# Patient Record
Sex: Female | Born: 1935
Health system: Southern US, Community
[De-identification: ages and names within clinical notes are randomized; demographics above are authoritative.]

## PROBLEM LIST (undated history)

## (undated) DIAGNOSIS — M899 Disorder of bone, unspecified: Secondary | ICD-10-CM

## (undated) DIAGNOSIS — G4700 Insomnia, unspecified: Secondary | ICD-10-CM

## (undated) DIAGNOSIS — L909 Atrophic disorder of skin, unspecified: Secondary | ICD-10-CM

## (undated) DIAGNOSIS — Z0000 Encounter for general adult medical examination without abnormal findings: Secondary | ICD-10-CM

## (undated) DIAGNOSIS — N952 Postmenopausal atrophic vaginitis: Secondary | ICD-10-CM

## (undated) DIAGNOSIS — J309 Allergic rhinitis, unspecified: Secondary | ICD-10-CM

## (undated) DIAGNOSIS — N6019 Diffuse cystic mastopathy of unspecified breast: Secondary | ICD-10-CM

## (undated) DIAGNOSIS — B351 Tinea unguium: Secondary | ICD-10-CM

## (undated) DIAGNOSIS — R51 Headache: Secondary | ICD-10-CM

## (undated) DIAGNOSIS — L919 Hypertrophic disorder of the skin, unspecified: Secondary | ICD-10-CM

## (undated) DIAGNOSIS — R002 Palpitations: Secondary | ICD-10-CM

## (undated) DIAGNOSIS — M949 Disorder of cartilage, unspecified: Secondary | ICD-10-CM

## (undated) DIAGNOSIS — I341 Nonrheumatic mitral (valve) prolapse: Secondary | ICD-10-CM

## (undated) DIAGNOSIS — G609 Hereditary and idiopathic neuropathy, unspecified: Secondary | ICD-10-CM

## (undated) DIAGNOSIS — I10 Essential (primary) hypertension: Secondary | ICD-10-CM

## (undated) DIAGNOSIS — H53149 Visual discomfort, unspecified: Secondary | ICD-10-CM

## (undated) DIAGNOSIS — E559 Vitamin D deficiency, unspecified: Secondary | ICD-10-CM

## (undated) DIAGNOSIS — G43909 Migraine, unspecified, not intractable, without status migrainosus: Secondary | ICD-10-CM

## (undated) DIAGNOSIS — E785 Hyperlipidemia, unspecified: Secondary | ICD-10-CM

## (undated) DIAGNOSIS — M84376A Stress fracture, unspecified foot, initial encounter for fracture: Secondary | ICD-10-CM

## (undated) DIAGNOSIS — M8580 Other specified disorders of bone density and structure, unspecified site: Secondary | ICD-10-CM

## (undated) DIAGNOSIS — R109 Unspecified abdominal pain: Secondary | ICD-10-CM

## (undated) DIAGNOSIS — K589 Irritable bowel syndrome without diarrhea: Secondary | ICD-10-CM

## (undated) DIAGNOSIS — R197 Diarrhea, unspecified: Secondary | ICD-10-CM

## (undated) HISTORY — DX: Migraine, unspecified, not intractable, without status migrainosus: G43.909

## (undated) HISTORY — DX: Other specified disorders of bone density and structure, unspecified site: M85.80

## (undated) HISTORY — DX: Diarrhea, unspecified: R19.7

## (undated) HISTORY — DX: Essential (primary) hypertension: I10

## (undated) HISTORY — DX: Palpitations: R00.2

## (undated) HISTORY — DX: Headache: R51

## (undated) HISTORY — DX: Hypertrophic disorder of the skin, unspecified: L91.9

## (undated) HISTORY — DX: Unspecified abdominal pain: R10.9

## (undated) HISTORY — DX: Nonrheumatic mitral (valve) prolapse: I34.1

## (undated) HISTORY — DX: Irritable bowel syndrome without diarrhea: K58.9

## (undated) HISTORY — PX: ROTATOR CUFF REPAIR: SHX139

## (undated) HISTORY — DX: Postmenopausal atrophic vaginitis: N95.2

## (undated) HISTORY — DX: Hereditary and idiopathic neuropathy, unspecified: G60.9

## (undated) HISTORY — DX: Visual discomfort, unspecified: H53.149

## (undated) HISTORY — DX: Stress fracture, unspecified foot, initial encounter for fracture: M84.376A

## (undated) HISTORY — DX: Encounter for general adult medical examination without abnormal findings: Z00.00

## (undated) HISTORY — DX: Hyperlipidemia, unspecified: E78.5

## (undated) HISTORY — DX: Allergic rhinitis, unspecified: J30.9

## (undated) HISTORY — DX: Atrophic disorder of skin, unspecified: L90.9

## (undated) HISTORY — DX: Tinea unguium: B35.1

## (undated) HISTORY — DX: Disorder of cartilage, unspecified: M94.9

## (undated) HISTORY — DX: Disorder of bone, unspecified: M89.9

## (undated) HISTORY — DX: Vitamin D deficiency, unspecified: E55.9

## (undated) HISTORY — PX: TONSILLECTOMY: SUR1361

## (undated) HISTORY — DX: Diffuse cystic mastopathy of unspecified breast: N60.19

## (undated) HISTORY — DX: Insomnia, unspecified: G47.00

---

## 1991-12-19 ENCOUNTER — Encounter: Payer: Self-pay | Admitting: Internal Medicine

## 1997-06-01 ENCOUNTER — Encounter: Admission: RE | Admit: 1997-06-01 | Discharge: 1997-08-30 | Payer: Self-pay | Admitting: *Deleted

## 1997-08-03 ENCOUNTER — Encounter: Admission: RE | Admit: 1997-08-03 | Discharge: 1997-11-01 | Payer: Self-pay | Admitting: *Deleted

## 1999-07-09 ENCOUNTER — Other Ambulatory Visit: Admission: RE | Admit: 1999-07-09 | Discharge: 1999-07-09 | Payer: Self-pay | Admitting: Family Medicine

## 2000-07-08 ENCOUNTER — Other Ambulatory Visit: Admission: RE | Admit: 2000-07-08 | Discharge: 2000-07-08 | Payer: Self-pay | Admitting: Family Medicine

## 2001-08-19 ENCOUNTER — Other Ambulatory Visit: Admission: RE | Admit: 2001-08-19 | Discharge: 2001-08-19 | Payer: Self-pay | Admitting: Family Medicine

## 2003-06-20 ENCOUNTER — Encounter: Payer: Self-pay | Admitting: Internal Medicine

## 2004-01-11 ENCOUNTER — Ambulatory Visit: Payer: Self-pay | Admitting: Family Medicine

## 2004-06-17 ENCOUNTER — Ambulatory Visit: Payer: Self-pay | Admitting: Family Medicine

## 2004-09-12 ENCOUNTER — Ambulatory Visit: Payer: Self-pay | Admitting: Family Medicine

## 2004-09-19 ENCOUNTER — Ambulatory Visit: Payer: Self-pay | Admitting: Family Medicine

## 2004-09-25 ENCOUNTER — Encounter: Admission: RE | Admit: 2004-09-25 | Discharge: 2004-09-25 | Payer: Self-pay | Admitting: Family Medicine

## 2005-09-15 ENCOUNTER — Ambulatory Visit: Payer: Self-pay | Admitting: Family Medicine

## 2005-09-22 ENCOUNTER — Ambulatory Visit: Payer: Self-pay | Admitting: Family Medicine

## 2005-09-25 ENCOUNTER — Ambulatory Visit: Payer: Self-pay | Admitting: Family Medicine

## 2005-10-29 ENCOUNTER — Encounter: Payer: Self-pay | Admitting: Family Medicine

## 2006-01-07 ENCOUNTER — Ambulatory Visit: Payer: Self-pay | Admitting: Family Medicine

## 2006-08-17 ENCOUNTER — Encounter: Payer: Self-pay | Admitting: Family Medicine

## 2006-08-17 ENCOUNTER — Ambulatory Visit: Payer: Self-pay | Admitting: Family Medicine

## 2006-09-10 DIAGNOSIS — G43909 Migraine, unspecified, not intractable, without status migrainosus: Secondary | ICD-10-CM | POA: Insufficient documentation

## 2006-09-10 DIAGNOSIS — M899 Disorder of bone, unspecified: Secondary | ICD-10-CM

## 2006-09-10 DIAGNOSIS — K589 Irritable bowel syndrome without diarrhea: Secondary | ICD-10-CM

## 2006-09-10 DIAGNOSIS — R002 Palpitations: Secondary | ICD-10-CM | POA: Insufficient documentation

## 2006-09-10 DIAGNOSIS — M949 Disorder of cartilage, unspecified: Secondary | ICD-10-CM

## 2006-09-10 HISTORY — DX: Palpitations: R00.2

## 2006-09-10 HISTORY — DX: Disorder of bone, unspecified: M89.9

## 2006-09-10 HISTORY — DX: Migraine, unspecified, not intractable, without status migrainosus: G43.909

## 2006-09-10 HISTORY — DX: Irritable bowel syndrome, unspecified: K58.9

## 2006-09-24 ENCOUNTER — Ambulatory Visit: Payer: Self-pay | Admitting: Family Medicine

## 2006-09-24 LAB — CONVERTED CEMR LAB
Albumin: 3.7 g/dL (ref 3.5–5.2)
BUN: 22 mg/dL (ref 6–23)
Basophils Absolute: 0 10*3/uL (ref 0.0–0.1)
Basophils Relative: 0.5 % (ref 0.0–1.0)
Bilirubin Urine: NEGATIVE
Blood in Urine, dipstick: NEGATIVE
CO2: 30 meq/L (ref 19–32)
Calcium: 9.3 mg/dL (ref 8.4–10.5)
Chloride: 103 meq/L (ref 96–112)
Cholesterol: 170 mg/dL (ref 0–200)
Creatinine, Ser: 0.9 mg/dL (ref 0.4–1.2)
Eosinophils Absolute: 0.3 10*3/uL (ref 0.0–0.6)
GFR calc Af Amer: 79 mL/min
Ketones, urine, test strip: NEGATIVE
MCV: 95.8 fL (ref 78.0–100.0)
Monocytes Absolute: 0.7 10*3/uL (ref 0.2–0.7)
Neutrophils Relative %: 46.3 % (ref 43.0–77.0)
Protein, U semiquant: NEGATIVE
RBC: 3.97 M/uL (ref 3.87–5.11)
RDW: 13 % (ref 11.5–14.6)
TSH: 3.43 microintl units/mL (ref 0.35–5.50)
Total Bilirubin: 1.2 mg/dL (ref 0.3–1.2)
Total CHOL/HDL Ratio: 3.8
VLDL: 20 mg/dL (ref 0–40)
WBC Urine, dipstick: NEGATIVE
WBC: 6 10*3/uL (ref 4.5–10.5)
pH: 7

## 2006-09-30 ENCOUNTER — Encounter: Payer: Self-pay | Admitting: Family Medicine

## 2006-10-01 ENCOUNTER — Ambulatory Visit: Payer: Self-pay | Admitting: Family Medicine

## 2006-10-01 DIAGNOSIS — E785 Hyperlipidemia, unspecified: Secondary | ICD-10-CM

## 2006-10-01 DIAGNOSIS — N952 Postmenopausal atrophic vaginitis: Secondary | ICD-10-CM

## 2006-10-01 DIAGNOSIS — R519 Headache, unspecified: Secondary | ICD-10-CM | POA: Insufficient documentation

## 2006-10-01 DIAGNOSIS — R51 Headache: Secondary | ICD-10-CM

## 2006-10-01 HISTORY — DX: Postmenopausal atrophic vaginitis: N95.2

## 2006-10-01 HISTORY — DX: Hyperlipidemia, unspecified: E78.5

## 2006-10-01 HISTORY — DX: Headache: R51

## 2006-10-13 ENCOUNTER — Ambulatory Visit: Payer: Self-pay | Admitting: Family Medicine

## 2006-10-14 ENCOUNTER — Telehealth (INDEPENDENT_AMBULATORY_CARE_PROVIDER_SITE_OTHER): Payer: Self-pay | Admitting: *Deleted

## 2006-11-09 DIAGNOSIS — L909 Atrophic disorder of skin, unspecified: Secondary | ICD-10-CM | POA: Insufficient documentation

## 2006-11-09 DIAGNOSIS — L919 Hypertrophic disorder of the skin, unspecified: Secondary | ICD-10-CM

## 2006-11-09 HISTORY — DX: Atrophic disorder of skin, unspecified: L90.9

## 2006-11-17 ENCOUNTER — Encounter: Payer: Self-pay | Admitting: Family Medicine

## 2006-12-11 ENCOUNTER — Ambulatory Visit: Payer: Self-pay | Admitting: Family Medicine

## 2007-07-16 ENCOUNTER — Telehealth (INDEPENDENT_AMBULATORY_CARE_PROVIDER_SITE_OTHER): Payer: Self-pay | Admitting: *Deleted

## 2007-09-29 ENCOUNTER — Ambulatory Visit: Payer: Self-pay | Admitting: Family Medicine

## 2007-09-29 LAB — CONVERTED CEMR LAB
AST: 30 units/L (ref 0–37)
BUN: 24 mg/dL — ABNORMAL HIGH (ref 6–23)
Basophils Relative: 0.5 % (ref 0.0–3.0)
Bilirubin Urine: NEGATIVE
Bilirubin, Direct: 0.1 mg/dL (ref 0.0–0.3)
Blood in Urine, dipstick: NEGATIVE
CO2: 30 meq/L (ref 19–32)
Chloride: 101 meq/L (ref 96–112)
Creatinine, Ser: 1 mg/dL (ref 0.4–1.2)
GFR calc Af Amer: 70 mL/min
Glucose, Bld: 96 mg/dL (ref 70–99)
Glucose, Urine, Semiquant: NEGATIVE
HCT: 40.4 % (ref 36.0–46.0)
Hemoglobin: 14 g/dL (ref 12.0–15.0)
Ketones, urine, test strip: NEGATIVE
LDL Cholesterol: 99 mg/dL (ref 0–99)
MCHC: 34.6 g/dL (ref 30.0–36.0)
MCV: 99 fL (ref 78.0–100.0)
Monocytes Absolute: 0.6 10*3/uL (ref 0.1–1.0)
Neutro Abs: 3 10*3/uL (ref 1.4–7.7)
Nitrite: NEGATIVE
Platelets: 246 10*3/uL (ref 150–400)
Potassium: 3.5 meq/L (ref 3.5–5.1)
Protein, U semiquant: NEGATIVE
RBC: 4.08 M/uL (ref 3.87–5.11)
RDW: 13.6 % (ref 11.5–14.6)
TSH: 4.07 microintl units/mL (ref 0.35–5.50)
Total CHOL/HDL Ratio: 3.4
Urobilinogen, UA: 0.2
WBC: 6.1 10*3/uL (ref 4.5–10.5)

## 2007-10-05 ENCOUNTER — Encounter: Payer: Self-pay | Admitting: Family Medicine

## 2007-10-06 ENCOUNTER — Ambulatory Visit: Payer: Self-pay | Admitting: Family Medicine

## 2007-10-06 DIAGNOSIS — I10 Essential (primary) hypertension: Secondary | ICD-10-CM

## 2007-10-06 HISTORY — DX: Essential (primary) hypertension: I10

## 2007-12-14 ENCOUNTER — Ambulatory Visit: Payer: Self-pay | Admitting: Family Medicine

## 2007-12-28 ENCOUNTER — Encounter: Payer: Self-pay | Admitting: Family Medicine

## 2008-02-02 ENCOUNTER — Ambulatory Visit: Payer: Self-pay | Admitting: Family Medicine

## 2008-02-02 DIAGNOSIS — J300 Vasomotor rhinitis: Secondary | ICD-10-CM | POA: Insufficient documentation

## 2008-02-02 DIAGNOSIS — J309 Allergic rhinitis, unspecified: Secondary | ICD-10-CM

## 2008-02-02 HISTORY — DX: Vasomotor rhinitis: J30.0

## 2008-02-02 HISTORY — DX: Allergic rhinitis, unspecified: J30.9

## 2008-06-23 ENCOUNTER — Telehealth: Payer: Self-pay | Admitting: Internal Medicine

## 2008-06-26 ENCOUNTER — Ambulatory Visit: Payer: Self-pay | Admitting: Internal Medicine

## 2008-06-26 DIAGNOSIS — R109 Unspecified abdominal pain: Secondary | ICD-10-CM

## 2008-06-26 DIAGNOSIS — R197 Diarrhea, unspecified: Secondary | ICD-10-CM

## 2008-06-26 HISTORY — DX: Diarrhea, unspecified: R19.7

## 2008-06-26 HISTORY — DX: Unspecified abdominal pain: R10.9

## 2008-06-28 ENCOUNTER — Ambulatory Visit (HOSPITAL_COMMUNITY): Admission: RE | Admit: 2008-06-28 | Discharge: 2008-06-28 | Payer: Self-pay | Admitting: Internal Medicine

## 2008-09-22 ENCOUNTER — Ambulatory Visit: Payer: Self-pay | Admitting: Family Medicine

## 2008-09-22 LAB — CONVERTED CEMR LAB
ALT: 23 units/L (ref 0–35)
AST: 32 units/L (ref 0–37)
Albumin: 3.9 g/dL (ref 3.5–5.2)
Bilirubin Urine: NEGATIVE
CO2: 30 meq/L (ref 19–32)
Calcium: 9.7 mg/dL (ref 8.4–10.5)
Cholesterol: 159 mg/dL (ref 0–200)
Creatinine, Ser: 1 mg/dL (ref 0.4–1.2)
Eosinophils Relative: 8.4 % — ABNORMAL HIGH (ref 0.0–5.0)
GFR calc non Af Amer: 57.22 mL/min (ref >60)
Glucose, Urine, Semiquant: NEGATIVE
LDL Cholesterol: 89 mg/dL (ref 0–99)
Lymphocytes Relative: 35.1 % (ref 12.0–46.0)
Lymphs Abs: 1.8 10*3/uL (ref 0.7–4.0)
Neutrophils Relative %: 44.2 % (ref 43.0–77.0)
Nitrite: NEGATIVE
RBC: 3.96 M/uL (ref 3.87–5.11)
RDW: 13.3 % (ref 11.5–14.6)
Specific Gravity, Urine: 1.015
Total Bilirubin: 1.3 mg/dL — ABNORMAL HIGH (ref 0.3–1.2)
Total Protein: 7.2 g/dL (ref 6.0–8.3)
VLDL: 18.4 mg/dL (ref 0.0–40.0)

## 2008-10-05 ENCOUNTER — Encounter: Payer: Self-pay | Admitting: Family Medicine

## 2008-10-06 ENCOUNTER — Ambulatory Visit: Payer: Self-pay | Admitting: Family Medicine

## 2008-10-06 ENCOUNTER — Telehealth: Payer: Self-pay | Admitting: *Deleted

## 2008-10-19 ENCOUNTER — Encounter: Payer: Self-pay | Admitting: Family Medicine

## 2008-10-19 ENCOUNTER — Ambulatory Visit: Payer: Self-pay | Admitting: Internal Medicine

## 2008-12-26 ENCOUNTER — Ambulatory Visit: Payer: Self-pay | Admitting: Family Medicine

## 2009-03-26 ENCOUNTER — Encounter: Payer: Self-pay | Admitting: *Deleted

## 2009-07-20 ENCOUNTER — Ambulatory Visit: Payer: Self-pay | Admitting: Internal Medicine

## 2009-07-20 DIAGNOSIS — H53149 Visual discomfort, unspecified: Secondary | ICD-10-CM

## 2009-07-20 HISTORY — DX: Visual discomfort, unspecified: H53.149

## 2009-09-25 ENCOUNTER — Ambulatory Visit: Payer: Self-pay | Admitting: Family Medicine

## 2009-09-25 LAB — CONVERTED CEMR LAB
AST: 29 units/L (ref 0–37)
BUN: 22 mg/dL (ref 6–23)
Basophils Absolute: 0 10*3/uL (ref 0.0–0.1)
Bilirubin Urine: NEGATIVE
Bilirubin, Direct: 0.2 mg/dL (ref 0.0–0.3)
CO2: 31 meq/L (ref 19–32)
Chloride: 104 meq/L (ref 96–112)
Cholesterol: 166 mg/dL (ref 0–200)
Creatinine, Ser: 0.9 mg/dL (ref 0.4–1.2)
Eosinophils Relative: 4.5 % (ref 0.0–5.0)
Glucose, Bld: 108 mg/dL — ABNORMAL HIGH (ref 70–99)
Glucose, Urine, Semiquant: NEGATIVE
Lymphs Abs: 1.9 10*3/uL (ref 0.7–4.0)
MCHC: 34 g/dL (ref 30.0–36.0)
MCV: 99.2 fL (ref 78.0–100.0)
Monocytes Absolute: 0.5 10*3/uL (ref 0.1–1.0)
Neutro Abs: 2.9 10*3/uL (ref 1.4–7.7)
Protein, U semiquant: NEGATIVE
RBC: 3.98 M/uL (ref 3.87–5.11)
Sodium: 140 meq/L (ref 135–145)
Total Bilirubin: 1.6 mg/dL — ABNORMAL HIGH (ref 0.3–1.2)
Total CHOL/HDL Ratio: 3
Urobilinogen, UA: 0.2
pH: 7

## 2009-11-06 ENCOUNTER — Ambulatory Visit: Payer: Self-pay | Admitting: Family Medicine

## 2009-11-06 ENCOUNTER — Other Ambulatory Visit: Admission: RE | Admit: 2009-11-06 | Discharge: 2009-11-06 | Payer: Self-pay | Admitting: Family Medicine

## 2009-11-06 LAB — CONVERTED CEMR LAB: Pap Smear: NEGATIVE

## 2009-12-04 ENCOUNTER — Ambulatory Visit: Payer: Self-pay | Admitting: Family Medicine

## 2010-01-01 ENCOUNTER — Ambulatory Visit: Payer: Self-pay | Admitting: Family Medicine

## 2010-03-01 ENCOUNTER — Ambulatory Visit
Admission: RE | Admit: 2010-03-01 | Discharge: 2010-03-01 | Payer: Self-pay | Source: Home / Self Care | Attending: Family Medicine | Admitting: Family Medicine

## 2010-03-08 ENCOUNTER — Ambulatory Visit
Admission: RE | Admit: 2010-03-08 | Discharge: 2010-03-08 | Payer: Self-pay | Source: Home / Self Care | Attending: Family Medicine | Admitting: Family Medicine

## 2010-03-14 ENCOUNTER — Encounter: Payer: Self-pay | Admitting: Cardiology

## 2010-03-14 ENCOUNTER — Ambulatory Visit
Admission: RE | Admit: 2010-03-14 | Discharge: 2010-03-14 | Payer: Self-pay | Source: Home / Self Care | Attending: Cardiology | Admitting: Cardiology

## 2010-03-18 ENCOUNTER — Encounter: Payer: Self-pay | Admitting: Cardiology

## 2010-03-18 ENCOUNTER — Ambulatory Visit: Admission: RE | Admit: 2010-03-18 | Discharge: 2010-03-18 | Payer: Self-pay | Source: Home / Self Care

## 2010-03-18 ENCOUNTER — Ambulatory Visit (HOSPITAL_COMMUNITY)
Admission: RE | Admit: 2010-03-18 | Discharge: 2010-03-18 | Payer: Self-pay | Source: Home / Self Care | Attending: Cardiology | Admitting: Cardiology

## 2010-03-18 DIAGNOSIS — R002 Palpitations: Secondary | ICD-10-CM

## 2010-03-30 ENCOUNTER — Encounter: Payer: Self-pay | Admitting: Family Medicine
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
## 2010-04-04 NOTE — Miscellaneous (Signed)
Summary: mammogram  Clinical Lists Changes  Observations: Added new observation of MAMMOGRAM: normal (03/20/2009 11:46)      Preventive Care Screening  Mammogram:    Date:  03/20/2009    Results:  normal

## 2010-04-04 NOTE — Assessment & Plan Note (Signed)
Summary: 1 month rov/njr   Vital Signs:  Patient profile:   77 year old female Menstrual status:  postmenopausal Weight:      125 pounds Temp:     98.4 degrees F oral BP sitting:   140 / 78  (left arm) Cuff size:   regular  Vitals Entered By: Kern Reap CMA Duncan Dull) (December 04, 2009 1:47 PM) CC: follow-up visit   Primary Care Jocilynn Grade:  Tinnie Gens Todd,MD  CC:  follow-up visit.  History of Present Illness: Christine Henderson  is a 52 year oldmarried female, nonsmoker, who comes in today for reevaluation of hypertension.  Tenoretic 50 -- 25 daily her blood pressure was too low.  We therefore cut the dose in half.  She's been monitoring her blood pressure home.  It still too low.  The other day.  She had an episode where she was walking and got very lightheaded.  Her BP at that time was 100/60.  Allergies: 1)  ! Codeine  Past History:  Past medical, surgical, family and social histories (including risk factors) reviewed for relevance to current acute and chronic problems.  Past Medical History: Reviewed history from 06/23/2008 and no changes required. fibrocystic breast ALLERGIC RHINITIS CAUSE UNSPECIFIED (ICD-477.9) HYPERTENSION (ICD-401.9) SKIN TAG (ICD-701.9) VAGINITIS, ATROPHIC (ICD-627.3) HEADACHE (ICD-784.0) HYPERLIPIDEMIA (ICD-272.4) HEALTH SCREENING (ICD-V70.0) IRRITABLE BOWEL SYNDROME (ICD-564.1) MIGRAINE HEADACHE (ICD-346.90) PALPITATIONS (ICD-785.1) OSTEOPENIA (ICD-733.90)  Past Surgical History: Reviewed history from 06/23/2008 and no changes required. CB x 4  Rotator cuff repair-Rt Colonoscopy-01/06/2005 Tonsillectomy  Family History: Reviewed history from 06/26/2008 and no changes required. No FH of Colon Cancer: Family History of Pancreatic Cancer:Mgrandfather Family History of Inflammatory Bowel Disease: Sister  Social History: Reviewed history from 06/26/2008 and no changes required. Married Never Smoked Alcohol use-no Drug use-no Regular  exercise-yes Occupation: retired  Review of Systems      See HPI  Physical Exam  General:  Well-developed,well-nourished,in no acute distress; alert,appropriate and cooperative throughout examination   Impression & Recommendations:  Problem # 1:  HYPERTENSION (ICD-401.9) Assessment Unchanged  The following medications were removed from the medication list:    Tenoretic 50 50-25 Mg Tabs (Atenolol-chlorthalidone) .[name] Kitchen... 1/.2 qam Her updated medication list for this problem includes:    Tenormin 25 Mg Tabs (Atenolol) .[name] Kitchen... 1/2 qam  Complete Medication List: 1)  Daily Multiple Vitamins Tabs (Multiple vitamin) .... Take 1 tablet by mouth once a day 2)  Calcium 1200 Mg With Vitamin D  .... Take 1 tablet by mouth once a day 3)  Fish Oil Oil (Fish oil) .... Take 1 tablet by mouth once a day 4)  Aspirin 81 Mg Tbec (Aspirin) .... Take 1 tablet by mouth once a day 5)  Simvastatin 10 Mg Tabs (Simvastatin) .... Take 1 tablet by mouth once a day 6)  Glucosamine Chondr 1500 Complx Caps (Glucosamine-chondroit-vit c-mn) .[name] Kitchen.. 1 tablet by mouth two times a day 7)  Vitamin C Cr 1000 Mg Cr-tabs (Ascorbic acid) .[name] Kitchen.. 1 by mouth once daily 8)  Imodium A-d 2 Mg Tabs (Loperamide hcl) .[name] Kitchen.. 1 by mouth as needed 9)  Premarin 0.625 Mg/gm Crea (Estrogens, conjugated) .... Uad 10)  Tenormin 25 Mg Tabs (Atenolol) .... 1/2 qam  Patient Instructions: 1)  decrease the Tenormin to 12.5 mg........ 25-mg tablet.......... one half tab........[name] Kitchen every morning.  Check her BP every morning.  Return in one month for follow-up Prescriptions: TENORMIN 25 MG TABS (ATENOLOL) 1/2 qam  #50 x 3   Entered and Authorized by:   Roderick Pee MD  Signed by:   Roderick Pee MD on 12/04/2009   Method used:   Electronically to        [facility] [address] (retail)       [phone]       [place], [place]  [address]       Ph: [phone]       Fax: [phone]   RxID:   [id number]

## 2010-04-04 NOTE — Assessment & Plan Note (Signed)
Summary: np6   Visit Type:  np Referring Provider:  n/a Primary Provider:  Tinnie Gens Todd,MD  CC:  palpitations, SOB, and dizziness.  History of Present Illness: 77 yo with history of HTN presents for evaluation of tachypalpitations.  This summer, patient would get episodes of dizziness/lightheadedness.  She saw Dr. Tawanna Cooler who noted that her blood pressure was on the low side.  Her BP meds were adjusted downward until she was only taking atenolol 25 mg daily.  In 12/11, her blood pressure started to go up significantly, running as high as 180s/100s.  On 12/30, she had an episode while in the kitchen fixing breakfast where her heart suddenly began to pound.  She felt lightheaded, and when she took her pulse, she noted it to be in the 140s.  Very soon after this, heart rate fell to the 60s.  She has had no further episodes like this but has noted more frequent brief palpitations/flutters.  She has a history of palpitations, but these are worse.  She was on atenolol 50 mg daily until this summer, and this appears to have controlled the palpitations well.  She is taking 25 mg atenolol daily now.   BP today is 159/97.  Her home readings have been 110s - 140s systolic.  Starting amlodipine seems to have gotten this under control   Labs (7/11): LDL 89, HDL 52, K 3.8, creatinine 0.9, TSH normal  ECG: NSR, normal  Current Medications (verified): 1)  Daily Multiple Vitamins   Tabs (Multiple Vitamin) .... Take 1 Tablet By Mouth Once A Day 2)  Calcium 1200 Mg With Vitamin D .... Take 1 Tablet By Mouth Once A Day 3)  Fish Oil   Oil (Fish Oil) .... Take 1 Tablet By Mouth Once A Day 4)  Aspirin 81 Mg  Tbec (Aspirin) .... Take 1 Tablet By Mouth Once A Day 5)  Simvastatin 10 Mg  Tabs (Simvastatin) .... Take 1 Tablet By Mouth Once A Day 6)  Glucosamine Chondr 1500 Complx  Caps (Glucosamine-Chondroit-Vit C-Mn) .[name] Kitchen.. 1 Tablet By Mouth Two Times A Day 7)  Vitamin C Cr 1000 Mg Cr-Tabs (Ascorbic Acid) .[name] Kitchen.. 1 By Mouth  Once Daily 8)  Imodium A-D 2 Mg Tabs (Loperamide Hcl) .[name] Kitchen.. 1 By Mouth As Needed 9)  Premarin 0.625 Mg/gm Crea (Estrogens, Conjugated) .... Uad 10)  Tenormin 25 Mg Tabs (Atenolol) .... 1/2 Qam and 1/2 in The Pm 11)  Amlodipine Besylate 5 Mg Tabs (Amlodipine Besylate) .... One By Mouth Once Daily  Allergies (verified): 1)  ! Codeine  Past History:  Past Medical History: 1. fibrocystic breast 2. ALLERGIC RHINITIS CAUSE UNSPECIFIED (ICD-477.9) 3. HYPERTENSION (ICD-401.9) 4. VAGINITIS, ATROPHIC (ICD-627.3) 5. HEADACHE (ICD-784.0): migraines 6. HYPERLIPIDEMIA (ICD-272.4) 7. IRRITABLE BOWEL SYNDROME (ICD-564.1) 8. PALPITATIONS (ICD-785.1): Has had these for years 9. OSTEOPENIA (ICD-733.90) 10. Mitral valve prolapse: Was given this diagnosis years ago.   Family History: No FH of Colon Cancer: Family History of Pancreatic Cancer:Mgrandfather Family History of Inflammatory Bowel Disease: Sister HTN No premature coronary disease  Social History: Married, lives in [place] Never Smoked Alcohol use-no Drug use-no Regular exercise-yes Occupation: retired  Review of Systems       All systems reviewed and negative except as per HPI.   Vital Signs:  Patient profile:   77 year old female Menstrual status:  postmenopausal Height:      63.5 inches Weight:      127 pounds BMI:     22.22 Pulse rate:   80 / minute  BP sitting:   159 / 97  (left arm) Cuff size:   regular  Vitals Entered By: Caralee Ates CMA (March 14, 2010 3:38 PM)  Physical Exam  General:  Well developed, well nourished, in no acute distress. Head:  normocephalic and atraumatic Nose:  no deformity, discharge, inflammation, or lesions Mouth:  Teeth, gums and palate normal. Oral mucosa normal. Neck:  Neck supple, no JVD. No masses, thyromegaly or abnormal cervical nodes. Lungs:  Clear bilaterally to auscultation and percussion. Heart:  Non-displaced PMI, chest non-tender; regular rate and rhythm, S1, S2 without  murmurs, rubs or gallops. Carotid upstroke normal, no bruit.  Pedals normal pulses. No edema, no varicosities. Abdomen:  Bowel sounds positive; abdomen soft and non-tender without masses, organomegaly, or hernias noted. No hepatosplenomegaly. Msk:  Back normal, normal gait. Muscle strength and tone normal. Extremities:  No clubbing or cyanosis. Neurologic:  Alert and oriented x 3. Skin:  Intact without lesions or rashes. Psych:  Normal affect.   Impression & Recommendations:  Problem # 1:  HYPERTENSION (ICD-401.9) BP seems to be reasonably controlled now by her home readings though it is a little high in the office today.   Problem # 2:  PALPITATIONS (ICD-785.1) I suspect that the symptoms she is getting currently are PVCs or PACs.  She is likely getting them more frequently on a lower dose of atenolol.  The episode she had on 12/30 sounds like SVT of some sort.  I will put her on a 3-week event monitor to see if any significant arrhythmias are detected.  Following this, I would consider increasing her atenolol back to 50 mg daily and perhaps decreasing amlodipine (if needed to keep BP from dipping too low like it did this summer).  Patient avoids caffeine.   Problem # 3:  MITRAL VALVE PROLAPSE Given patient's history of mitral valve prolapse, will get an echocardiogram.   Other Orders: EKG w/ Interpretation (93000) Event (Event) Echocardiogram (Echo)  Patient Instructions: 1)  Your physician recommends that you schedule a follow-up appointment in: 1 month. 2)  Your physician has requested that you have an echocardiogram.  Echocardiography is a painless test that uses sound waves to create images of your heart. It provides your doctor with information about the size and shape of your heart and how well your heart's chambers and valves are working.  This procedure takes approximately one hour. There are no restrictions for this procedure. 3)  Your physician has recommended that you wear an  event monitor for 3 weeks.  Event monitors are medical devices that record the heart's electrical activity. Doctors most often use these monitors to diagnose arrhythmias. Arrhythmias are problems with the speed or rhythm of the heartbeat. The monitor is a small, portable device. You can wear one while you do your normal daily activities. This is usually used to diagnose what is causing palpitations/syncope (passing out).

## 2010-04-04 NOTE — Assessment & Plan Note (Signed)
Summary: high bp readings/cjr   Vital Signs:  Patient profile:   77 year old female Menstrual status:  postmenopausal Weight:      126 pounds Temp:     97.7 degrees F oral Pulse rate:   80 / minute Resp:     12 per minute BP sitting:   170 / 120  (left arm) BP standing:   184 / 108 Cuff size:   regular  Vitals Entered By: Sid Falcon LPN (March 01, 2010 3:42 PM)  Serial Vital Signs/Assessments:  Time      Position  BP       Pulse  Resp  Temp     By           Sitting   182/110                        Evelena Peat MD           Standing  184/108                        Evelena Peat MD  CC: concerned about high BP Is Patient Diabetic? No   History of Present Illness: Seen for elevating blood pressures.  Pt in Sept had freq light headed symptoms.  Was on Atenolol/chlor  combination and reduced to Atenolol 25 mg one half tablet daily. Recent BP running 170-180 systolic and 90-105 diastolic.  No headaches, chest pain, or dizziness.  Thyroid normal in September. Pt compliant with meds.  No diet changes other than possibly more Na over holidays. No ETOH and no regular use of NSAIDS.   Allergies: 1)  ! Codeine  Past History:  Past Medical History: Last updated: 06/23/2008 fibrocystic breast ALLERGIC RHINITIS CAUSE UNSPECIFIED (ICD-477.9) HYPERTENSION (ICD-401.9) SKIN TAG (ICD-701.9) VAGINITIS, ATROPHIC (ICD-627.3) HEADACHE (ICD-784.0) HYPERLIPIDEMIA (ICD-272.4) HEALTH SCREENING (ICD-V70.0) IRRITABLE BOWEL SYNDROME (ICD-564.1) MIGRAINE HEADACHE (ICD-346.90) PALPITATIONS (ICD-785.1) OSTEOPENIA (ICD-733.90)  Past Surgical History: Last updated: 06/23/2008 CB x 4  Rotator cuff repair-Rt Colonoscopy-01/06/2005 Tonsillectomy  Family History: Last updated: 06/26/2008 No FH of Colon Cancer: Family History of Pancreatic Cancer:Mgrandfather Family History of Inflammatory Bowel Disease: Sister  Social History: Last updated: 06/26/2008 Married Never  Smoked Alcohol use-no Drug use-no Regular exercise-yes Occupation: retired  Risk Factors: Exercise: yes (10/06/2007)  Risk Factors: Smoking Status: never (11/06/2009) PMH-FH-SH reviewed for relevance  Review of Systems  The patient denies anorexia, fever, weight loss, chest pain, syncope, dyspnea on exertion, peripheral edema, prolonged cough, headaches, hemoptysis, abdominal pain, melena, hematochezia, severe indigestion/heartburn, and muscle weakness.    Physical Exam  General:  Well-developed,well-nourished,in no acute distress; alert,appropriate and cooperative throughout examination Head:  normocephalic and atraumatic.   Eyes:  pupils equal, pupils round, and pupils reactive to light.   Mouth:  Oral mucosa and oropharynx without lesions or exudates.  Teeth in good repair. Neck:  No deformities, masses, or tenderness noted. Lungs:  Normal respiratory effort, chest expands symmetrically. Lungs are clear to auscultation, no crackles or wheezes. Heart:  Normal rate and regular rhythm. S1 and S2 normal without gallop, murmur, click, rub or other extra sounds. Extremities:  no edema Neurologic:  alert & oriented X3, cranial nerves II-XII intact, and strength normal in all extremities.     Impression & Recommendations:  Problem # 1:  HYPERTENSION (ICD-401.9) Assessment Deteriorated pt with several day hx of severe BP elevation.  Add amlodipine and cont with atenolol.  Reassess with Dr Tawanna Cooler today. Her updated medication  list for this problem includes:    Tenormin 25 Mg Tabs (Atenolol) .[name] Kitchen... 1/2 qam    Amlodipine Besylate 5 Mg Tabs (Amlodipine besylate) ..... One by mouth once daily  Complete Medication List: 1)  Daily Multiple Vitamins Tabs (Multiple vitamin) .... Take 1 tablet by mouth once a day 2)  Calcium 1200 Mg With Vitamin D  .... Take 1 tablet by mouth once a day 3)  Fish Oil Oil (Fish oil) .... Take 1 tablet by mouth once a day 4)  Aspirin 81 Mg Tbec (Aspirin) ....  Take 1 tablet by mouth once a day 5)  Simvastatin 10 Mg Tabs (Simvastatin) .... Take 1 tablet by mouth once a day 6)  Glucosamine Chondr 1500 Complx Caps (Glucosamine-chondroit-vit c-mn) .[name] Kitchen.. 1 tablet by mouth two times a day 7)  Vitamin C Cr 1000 Mg Cr-tabs (Ascorbic acid) .[name] Kitchen.. 1 by mouth once daily 8)  Imodium A-d 2 Mg Tabs (Loperamide hcl) .[name] Kitchen.. 1 by mouth as needed 9)  Premarin 0.625 Mg/gm Crea (Estrogens, conjugated) .... Uad 10)  Tenormin 25 Mg Tabs (Atenolol) .... 1/2 qam 11)  Amlodipine Besylate 5 Mg Tabs (Amlodipine besylate) .... One by mouth once daily  Patient Instructions: 1)  Start Amlodipine 5 mg one daily 2)  Increase atenolol 25 mg to one half twice daily 3)  Schedule follow up with Dr Tawanna Cooler end of next week. 4)  Avoid Advil as much as possible. Prescriptions: AMLODIPINE BESYLATE 5 MG TABS (AMLODIPINE BESYLATE) one by mouth once daily  #30 x 5   Entered and Authorized by:   Evelena Peat MD   Signed by:   Evelena Peat MD on 03/01/2010   Method used:   Electronically to        [facility] [address] (retail)       [phone]       [place], [place]  [address]       Ph: [phone]       Fax: [phone]   RxID:   [id number]    Orders Added: 1)  Est. Patient Level IV [26948]

## 2010-04-04 NOTE — Assessment & Plan Note (Signed)
Summary: EYE INFECTION // RS   Vital Signs:  Patient profile:   77 year old female Weight:      129 pounds Temp:     98.2 degrees F oral BP sitting:   160 / 100  (left arm) Cuff size:   regular  Vitals Entered By: Duard Brady LPN (Jul 20, 2009 10:47 AM) CC: c/o (L) eye 'inflamation" , watering, scratchy, but no drainage Is Patient Diabetic? No   Primary Care Provider:  Tinnie Gens Todd,MD  CC:  c/o (L) eye 'inflamation" , watering, scratchy, and but no drainage.  History of Present Illness: 77 year old patient, who noted a foreign body sensation in her left eye late yesterday evening.  She had a fairly uncomfortable night.  She has noticed some slight redness.  Mild increased lacrimation, but no purulent drainage.  She has noted a significant photophobia.  She has had conjunctivitis in the past, associated with discharge, but this scenario is much different.  Photophobia is a new complaint.  Preventive Screening-Counseling & Management  Alcohol-Tobacco     Smoking Status: never  Allergies: 1)  ! Codeine  Physical Exam  General:  Well-developed,well-nourished,in no acute distress; alert,appropriate and cooperative throughout examination Head:  Normocephalic and atraumatic without obvious abnormalities. No apparent alopecia or balding. Eyes:  mild conjunctiva injection bilaterally.  No exudate.  Pupils are mid position, and equally reactive.  mild cataract  noted on the left, photophobia noted on the left; visual acuity 20/40 left eye and 20/30, OD   Impression & Recommendations:  Problem # 1:  PHOTOPHOBIA (ZOX-096.04)  Orders: Ophthalmology Referral (Ophthalmology)  Problem # 2:  HYPERTENSION (ICD-401.9)  Her updated medication list for this problem includes:    Tenoretic 50 50-25 Mg Tabs (Atenolol-chlorthalidone) .[name] Kitchen... Take 1 tablet by mouth once a day  Her updated medication list for this problem includes:    Tenoretic 50 50-25 Mg Tabs (Atenolol-chlorthalidone)  .[name] Kitchen... Take 1 tablet by mouth once a day  Complete Medication List: 1)  Daily Multiple Vitamins Tabs (Multiple vitamin) .... Take 1 tablet by mouth once a day 2)  Calcium 1200 Mg With Vitamin D  .... Take 1 tablet by mouth once a day 3)  Fish Oil Oil (Fish oil) .... Take 1 tablet by mouth once a day 4)  Tenoretic 50 50-25 Mg Tabs (Atenolol-chlorthalidone) .... Take 1 tablet by mouth once a day 5)  Aspirin 81 Mg Tbec (Aspirin) .... Take 1 tablet by mouth once a day 6)  Simvastatin 10 Mg Tabs (Simvastatin) .... Take 1 tablet by mouth once a day 7)  Glucosamine Chondr 1500 Complx Caps (Glucosamine-chondroit-vit c-mn) .[name] Kitchen.. 1 tablet by mouth two times a day 8)  Vitamin C Cr 1000 Mg Cr-tabs (Ascorbic acid) .[name] Kitchen.. 1 by mouth once daily 9)  Imodium A-d 2 Mg Tabs (Loperamide hcl) .[name] Kitchen.. 1 by mouth as needed 10)  Dicyclomine Hcl 20 Mg Tabs (Dicyclomine hcl) .... Take 1 tablet by mouth every morning and as needed 11)  Fosamax 70 Mg Tabs (Alendronate sodium) .[name] KitchenMarland KitchenMarland Kitchen 1 weekly  Patient Instructions: 1)  follow-up with Dr. Oneta Rack today
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
## 2010-04-04 NOTE — Assessment & Plan Note (Signed)
Summary: 1 month rov/njr   Vital Signs:  Patient profile:   77 year old female Menstrual status:  postmenopausal Weight:      127 pounds O2 Sat:      96 % on Room air Pulse rate:   68 / minute Pulse rhythm:   regular Resp:     16 per minute BP sitting:   142 / 78  (left arm)  Vitals Entered By: Harold Barban (January 01, 2010 10:12 AM)  O2 Flow:  Room air CC: 1 month follow-up for BP   Primary Care Provider:  Tinnie Gens Todd,MD  CC:  1 month follow-up for BP.  History of Present Illness: Christine Henderson is a 77 year old female, who comes back today for follow-up of hyper tension.  We actually had to decrease her Tenormin to 12.5 mg daily because blood pressure was too low and she was lightheaded when she stood up.  Now her blood pressures are ranging 130 to 140 systolic to diastolic 7580 no lightheadedness  Allergies: 1)  ! Codeine  Past History:  Past medical, surgical, family and social histories (including risk factors) reviewed for relevance to current acute and chronic problems.  Past Medical History: Reviewed history from 06/23/2008 and no changes required. fibrocystic breast ALLERGIC RHINITIS CAUSE UNSPECIFIED (ICD-477.9) HYPERTENSION (ICD-401.9) SKIN TAG (ICD-701.9) VAGINITIS, ATROPHIC (ICD-627.3) HEADACHE (ICD-784.0) HYPERLIPIDEMIA (ICD-272.4) HEALTH SCREENING (ICD-V70.0) IRRITABLE BOWEL SYNDROME (ICD-564.1) MIGRAINE HEADACHE (ICD-346.90) PALPITATIONS (ICD-785.1) OSTEOPENIA (ICD-733.90)  Past Surgical History: Reviewed history from 06/23/2008 and no changes required. CB x 4  Rotator cuff repair-Rt Colonoscopy-01/06/2005 Tonsillectomy  Family History: Reviewed history from 06/26/2008 and no changes required. No FH of Colon Cancer: Family History of Pancreatic Cancer:Mgrandfather Family History of Inflammatory Bowel Disease: Sister  Social History: Reviewed history from 06/26/2008 and no changes required. Married Never Smoked Alcohol use-no Drug  use-no Regular exercise-yes Occupation: retired  Review of Systems      See HPI  Physical Exam  General:  Well-developed,well-nourished,in no acute distress; alert,appropriate and cooperative throughout examination   Impression & Recommendations:  Problem # 1:  HYPERTENSION (ICD-401.9) Assessment Improved  Her updated medication list for this problem includes:    Tenormin 25 Mg Tabs (Atenolol) .[name] Kitchen... 1/2 qam  Complete Medication List: 1)  Daily Multiple Vitamins Tabs (Multiple vitamin) .... Take 1 tablet by mouth once a day 2)  Calcium 1200 Mg With Vitamin D  .... Take 1 tablet by mouth once a day 3)  Fish Oil Oil (Fish oil) .... Take 1 tablet by mouth once a day 4)  Aspirin 81 Mg Tbec (Aspirin) .... Take 1 tablet by mouth once a day 5)  Simvastatin 10 Mg Tabs (Simvastatin) .... Take 1 tablet by mouth once a day 6)  Glucosamine Chondr 1500 Complx Caps (Glucosamine-chondroit-vit c-mn) .[name] Kitchen.. 1 tablet by mouth two times a day 7)  Vitamin C Cr 1000 Mg Cr-tabs (Ascorbic acid) .[name] Kitchen.. 1 by mouth once daily 8)  Imodium A-d 2 Mg Tabs (Loperamide hcl) .[name] Kitchen.. 1 by mouth as needed 9)  Premarin 0.625 Mg/gm Crea (Estrogens, conjugated) .... Uad 10)  Tenormin 25 Mg Tabs (Atenolol) .... 1/2 qam  Patient Instructions: 1)  continue the Tenormin in half a tablet daily.  Return in September 2012 42 or annual physical exam. 2)  Check your blood pressure weekly. 3)  If you see an upward trend then returned sooner than September Prescriptions: TENORMIN 25 MG TABS (ATENOLOL) 1/2 qam  #50 x 3   Entered and Authorized by:   Roderick Pee  MD   Signed by:   Roderick Pee MD on 01/01/2010   Method used:   Print then Give to Patient   RxID:   [id number]    Orders Added: 1)  Est. Patient Level II [69629]

## 2010-04-05 ENCOUNTER — Ambulatory Visit: Admit: 2010-04-05 | Payer: Self-pay | Admitting: Family Medicine

## 2010-04-05 ENCOUNTER — Ambulatory Visit (INDEPENDENT_AMBULATORY_CARE_PROVIDER_SITE_OTHER): Payer: Medicare Other | Admitting: Family Medicine

## 2010-04-05 ENCOUNTER — Encounter: Payer: Self-pay | Admitting: Family Medicine

## 2010-04-05 VITALS — BP 140/80 | Temp 98.3°F | Ht 63.75 in | Wt 125.0 lb

## 2010-04-05 DIAGNOSIS — E785 Hyperlipidemia, unspecified: Secondary | ICD-10-CM

## 2010-04-05 DIAGNOSIS — I10 Essential (primary) hypertension: Secondary | ICD-10-CM

## 2010-04-05 NOTE — Progress Notes (Signed)
  Subjective:    Patient ID: Christine Henderson, female    DOB: 12/03/1930, 77 y.o.   MRN: [id number]  HPI  E. Is a 77 year old, married female, nonsmoker, who comes in today for reevaluation of hypertension.  She currently takes Norvasc 5 mg daily, Tenormin 12.5 mg b.i.d., she brings in her home monitor, which we validated as being accurate.  BP at home 120/70, and she is lightheaded when she stands out.  She feels like sometimes she might pass out.  She currently has a monitor on via cardiology.  She is due to go back for follow-up.  Review of Systems    12  Point  review of systems negative Objective:   Physical Exam     She is a well-developed, well-nourished, thin, female, in no acute distress.  BP right arm sitting position 140/80.  Validated both narcotics and hers.  Pulse is 60 and regular   Assessment & Plan:  Hypertension,,,,,,,,,,, decrease the Tenormin to 12.5 mg q.a.m. Continue the Norvasc 5 mg daily.  Continued blood pressure monitoring and follow-up with cardiology

## 2010-04-05 NOTE — Patient Instructions (Signed)
Decrease the Tenormin only take 12.5 mg daily.  Continue the Norvasc 5 mg daily continue to monitor your blood pressure and take only her blood pressure readings Q. Next cardiology visit

## 2010-04-10 ENCOUNTER — Telehealth: Payer: Self-pay | Admitting: Cardiology

## 2010-04-11 ENCOUNTER — Ambulatory Visit (INDEPENDENT_AMBULATORY_CARE_PROVIDER_SITE_OTHER): Payer: Medicare Other | Admitting: Cardiology

## 2010-04-11 ENCOUNTER — Encounter: Payer: Self-pay | Admitting: Cardiology

## 2010-04-11 DIAGNOSIS — R002 Palpitations: Secondary | ICD-10-CM

## 2010-04-18 NOTE — Progress Notes (Signed)
Summary: monitor results 03/18/10-04/07/10  Phone Note Outgoing Call   Call placed by: Katina Dung, RN, BSN,  April 10, 2010 11:51 AM Call placed to: Patient Summary of Call: monitor results 03/18/10-04/07/10  Follow-up for Phone Call        04/09/10--Dr Shirlee Latch reviewed monitor 03/18/10-04/07/10--1st degree AV block. No significant events--pt notified of results--pt has appt with Dr Shirlee Latch 04/11/10

## 2010-04-18 NOTE — Assessment & Plan Note (Addendum)
Summary: 1WK F/U OK PER [facility]DR[facility]/SL/AMD appt confirm=mj   Referring Provider:  n/a Primary Provider:  Tinnie Gens Todd,MD  CC:  1 week follow up.  Pt states she is feeling pretty good but does report some lightheadedness still at times.  Pt reports fatigue that she thinks is coming from her medication.  [name] Kitchen  History of Present Illness: 77 yo with history of HTN presents initially for evaluation of tachypalpitations.  This summer, patient would get episodes of dizziness/lightheadedness.  She saw Dr. Tawanna Cooler who noted that her blood pressure was on the low side.  Her BP meds were adjusted downward until she was only taking atenolol 25 mg daily.  In 12/11, her blood pressure started to go up significantly, running as high as 180s/100s.  On 12/30, she had an episode while in the kitchen fixing breakfast where her heart suddenly began to pound.  She felt lightheaded, and when she took her pulse, she noted it to be in the 140s.  Very soon after this, heart rate fell to the 60s.  She has had no further episodes like this but has noted more frequent brief palpitations/flutters.    I had her do a 3 week event monitor to look for any evidence for significant arrhythmias.  This showed no significant events.  Echo showed preserved LV systolic function.  Main complaint now is some lightheadedness in the morning after taking her BP medications.  BP is actually 162/86 today.  She brings a list of blood pressures, with systolic pressure ranging in the 120s-130s at home.  She does say that she has never actually checked her BP when she feels lightheaded. She has never fallen or passed out.  She already splits up her medications with atenolol in the am and Norvasc in the pm.  Atenolol dose was recently decreased again. She has some generalized fatigue.   Labs (7/11): LDL 89, HDL 52, K 3.8, creatinine 0.9, TSH normal  Current Medications (verified): 1)  Daily Multiple Vitamins   Tabs (Multiple Vitamin) .... Take 1 Tablet By  Mouth Once A Day 2)  Calcium 1200 Mg With Vitamin D .... Take 1 Tablet By Mouth Once A Day 3)  Fish Oil   Oil (Fish Oil) .... Take 1 Tablet By Mouth Once A Day 4)  Aspirin 81 Mg  Tbec (Aspirin) .... Take 1 Tablet By Mouth Once A Day 5)  Simvastatin 10 Mg  Tabs (Simvastatin) .... Take 1 Tablet By Mouth Once A Day 6)  Glucosamine Chondr 1500 Complx  Caps (Glucosamine-Chondroit-Vit C-Mn) .[name] Kitchen.. 1 Tablet By Mouth Two Times A Day 7)  Vitamin C Cr 1000 Mg Cr-Tabs (Ascorbic Acid) .[name] Kitchen.. 1 By Mouth Once Daily 8)  Imodium A-D 2 Mg Tabs (Loperamide Hcl) .[name] Kitchen.. 1 By Mouth As Needed 9)  Premarin 0.625 Mg/gm Crea (Estrogens, Conjugated) .... Uad 10)  Tenormin 25 Mg Tabs (Atenolol) .... 1/2 Qam 11)  Amlodipine Besylate 5 Mg Tabs (Amlodipine Besylate) .... One By Mouth Once Daily  Allergies (verified): 1)  ! Codeine  Past History:  Past Medical History: 1. fibrocystic breast 2. ALLERGIC RHINITIS CAUSE UNSPECIFIED (ICD-477.9) 3. HYPERTENSION (ICD-401.9) 4. VAGINITIS, ATROPHIC (ICD-627.3) 5. HEADACHE (ICD-784.0): migraines 6. HYPERLIPIDEMIA (ICD-272.4) 7. IRRITABLE BOWEL SYNDROME (ICD-564.1) 8. PALPITATIONS (ICD-785.1): Has had these for years.  3 week event monitor (1/12) with no significant arrhythmias.  9. OSTEOPENIA (ICD-733.90) 10. Echo (1/12): EF 55-60%, moderate diastolic dysfunction, mild to moderate biatrial enlargement, normal RV size and systolic function.   Family History: Reviewed history from  03/14/2010 and no changes required. No FH of Colon Cancer: Family History of Pancreatic Cancer:Mgrandfather Family History of Inflammatory Bowel Disease: Sister HTN No premature coronary disease  Social History: Reviewed history from 03/14/2010 and no changes required. Married, lives in [place] Never Smoked Alcohol use-no Drug use-no Regular exercise-yes Occupation: retired  Vital Signs:  Patient profile:   77 year old female Menstrual status:  postmenopausal Height:      63.5  inches Weight:      125 pounds BMI:     21.87 Pulse rate:   78 / minute Pulse rhythm:   regular BP sitting:   162 / 86  (left arm) Cuff size:   regular  Vitals Entered By: Judithe Modest CMA (April 11, 2010 10:59 AM)  Physical Exam  General:  Well developed, well nourished, in no acute distress. Neck:  Neck supple, no JVD. No masses, thyromegaly or abnormal cervical nodes. Lungs:  Clear bilaterally to auscultation and percussion. Heart:  Non-displaced PMI, chest non-tender; regular rate and rhythm, S1, S2 without murmurs, rubs or gallops. Carotid upstroke normal, no bruit.  Pedals normal pulses. No edema, no varicosities. Abdomen:  Bowel sounds positive; abdomen soft and non-tender without masses, organomegaly, or hernias noted. No hepatosplenomegaly. Extremities:  No clubbing or cyanosis. Neurologic:  Alert and oriented x 3. Psych:  Normal affect.   Impression & Recommendations:  Problem # 1:  PALPITATIONS (ICD-785.1) 3 week event monitor showed no significant arrhythmia.  I cannot rule out a tachyarrhythmia of some sort during her 12/11 episode but it does not appear to happen frequently. Would continue at least low dose atenolol if she can tolerate it.   Problem # 2:  HYPERTENSION (ICD-401.9) Patient reports episodes of lightheadedness after taking her am meds but has never checked her BP during these episodes.  BP is high today but readings from home look good.  I suspect that she is not going to tolerate blood pressure control to much lower than 130 or so systolic and would aim for SBP in the 130s-140s.  I asked her to check her BP when she feels lightheaded and to take those readings to her next appointment with Dr. Tawanna Cooler.   Patient Instructions: 1)  Your physician recommends that you schedule a follow-up appointment as needed with Dr Shirlee Latch.

## 2010-04-24 NOTE — Procedures (Signed)
Summary: Summary Report  Summary Report   Imported By: Erle Crocker 04/15/2010 09:24:37  _____________________________________________________________________  External Attachment:    Type:   Image     Comment:   External Document

## 2010-05-01 ENCOUNTER — Encounter: Payer: Self-pay | Admitting: Family Medicine

## 2010-05-13 ENCOUNTER — Ambulatory Visit (INDEPENDENT_AMBULATORY_CARE_PROVIDER_SITE_OTHER): Payer: Medicare Other | Admitting: Family Medicine

## 2010-05-13 ENCOUNTER — Encounter: Payer: Self-pay | Admitting: Family Medicine

## 2010-05-13 DIAGNOSIS — I959 Hypotension, unspecified: Secondary | ICD-10-CM

## 2010-05-13 DIAGNOSIS — E785 Hyperlipidemia, unspecified: Secondary | ICD-10-CM

## 2010-05-13 NOTE — Progress Notes (Signed)
  Subjective:    Patient ID: Christine Henderson, female    DOB: 1930/07/10, 77 y.o.   MRN: [id number]  HPI  Christine Henderson is a delightful, 77 year old female, who comes in today for follow-up of low blood pressure.  She previously had high blood pressure however, her blood pressure then dropped too low.  We been tapering off her medications.  She's currently on Tenormin 12.5 mg daily and Norvasc 5 mg daily BP here 140/90 at home 120/80.  She complains of persistent lightheadedness when she stands up suddenly.  No syncope  Review of Systems    Negative Objective:   Physical Exam    Well developed, well nourished, female no acute distress.  BP right arm sitting position 140/90    Assessment & Plan:  Hypotension plan cut, Norvasc, and have follow-up blood pressure in 4 weeks.  Continue 12.5 mg of Tenormin daily

## 2010-05-13 NOTE — Patient Instructions (Signed)
At the 5-mg Norvasc tablet in half............. 2.5 mg q. A.m........... Blood pressure checked daily in the morning.  Follow-up in one month

## 2010-06-13 ENCOUNTER — Encounter: Payer: Self-pay | Admitting: Family Medicine

## 2010-06-13 ENCOUNTER — Ambulatory Visit (INDEPENDENT_AMBULATORY_CARE_PROVIDER_SITE_OTHER): Payer: Medicare Other | Admitting: Family Medicine

## 2010-06-13 DIAGNOSIS — I10 Essential (primary) hypertension: Secondary | ICD-10-CM

## 2010-06-13 NOTE — Progress Notes (Signed)
  Subjective:    Patient ID: Christine Henderson, female    DOB: December 22, 1930, 77 y.o.   MRN: [id number]  HPI Christine Henderson a 77 year old female, who comes in today for evaluation of two problems.  She developed hypotension.  We have gradually tapered.  Her medication.  She Henderson currently on 2.5 mg of Norvasc daily and atenolol 12.5 mg daily.  BP 130/80, pulse 70 and regular.  No more episodes of lightheadedness.  She tells me she's had problems with her left eye for about a year.  She was diagnosed to have a viral infection.  The cornea and was given acyclovir drops.  She's been back to see them on a couple occasions, but still her eye Henderson irritated and red.  She wants my opinion.  I recommend she go back and see the ophthalmologist   Review of Systems General and the cardiovascular review of systems otherwise negative    Objective:   Physical Exam    Well nourished, female no acute distress.  BP 130/80.  Room sitting position    Assessment & Plan:  Hypertension under good control with decrease in medication.  Check BP weekly return p.r.n.Christine Henderson ophthalmologist ASAP

## 2010-06-13 NOTE — Patient Instructions (Signed)
Continue your current dose of medication.  Check your blood pressure weekly.  Return for your annual physical examination, sooner if any problems.  Call your ophthalmologist, now for evaluation ASAP

## 2010-09-03 ENCOUNTER — Ambulatory Visit (INDEPENDENT_AMBULATORY_CARE_PROVIDER_SITE_OTHER): Payer: Medicare Other | Admitting: Family Medicine

## 2010-09-03 ENCOUNTER — Encounter: Payer: Self-pay | Admitting: Family Medicine

## 2010-09-03 DIAGNOSIS — I10 Essential (primary) hypertension: Secondary | ICD-10-CM

## 2010-09-03 NOTE — Patient Instructions (Signed)
Hold the Norvasc.  Continue the Tenormin 12.5 mg daily.  Check her blood pressure daily in the morning.  Return in two weeks with the data and the device

## 2010-09-03 NOTE — Progress Notes (Signed)
  Subjective:    Patient ID: ZAIDY ABSHER, female    DOB: 1931-01-08, 77 y.o.   MRN: [id number]  HPI Rabiah is a 77 year old female, who comes in today for follow-up of hypertension.  We have decreased her medicine to 2.5 mg of Norvasc daily, and 12.5 mg of Tenormin daily.  However, her blood pressures at home are still running low, and the other day.  It dropped to 92/58.  BP today 150/90 right arm sitting position by [employer]ras and knee pain   Review of Systems     cardiovascular his systems otherwise negative Objective:   Physical Exam    Well-developed well-nourished, female, in no acute distress.  BP 150/90 right arm sitting position    Assessment & Plan:  Hypertension plan stop Norvasc continue Tenormin to 25 mg daily BP check.  Q.a.m. Follow-up with data and device in two weeks

## 2010-09-18 ENCOUNTER — Ambulatory Visit (INDEPENDENT_AMBULATORY_CARE_PROVIDER_SITE_OTHER): Payer: Medicare Other | Admitting: Family Medicine

## 2010-09-18 ENCOUNTER — Encounter: Payer: Self-pay | Admitting: Family Medicine

## 2010-09-18 DIAGNOSIS — I10 Essential (primary) hypertension: Secondary | ICD-10-CM

## 2010-09-18 NOTE — Progress Notes (Signed)
  Subjective:    Patient ID: Christine Henderson, female    DOB: 1930-06-26, 77 y.o.   MRN: [id number]  HPI  Christine Henderson is a 77 year old female, who comes in today for evaluation of hypotension.  We have been decreasing her medication because her blood pressure dropped too low.  Now her blood pressures running150/90.  At, home it's more in the 135/85 range Review of Systems    General and cardiovascular review of systems otherwise negative Objective:   Physical Exam     Well-developed well-nourished, female, in no acute distress.  BP right arm, 140/90 right sitting position   Assessment & Plan:  Blood pressure control.  Continue Tenormin 12.5 mg daily BP check weekly return p.r.n.

## 2010-09-18 NOTE — Patient Instructions (Signed)
Continue the Tenormin 12.5 mg daily in the morning.  Check your blood pressure weekly.  Return care

## 2010-11-05 ENCOUNTER — Other Ambulatory Visit (INDEPENDENT_AMBULATORY_CARE_PROVIDER_SITE_OTHER): Payer: Medicare Other

## 2010-11-05 DIAGNOSIS — I10 Essential (primary) hypertension: Secondary | ICD-10-CM

## 2010-11-05 DIAGNOSIS — E785 Hyperlipidemia, unspecified: Secondary | ICD-10-CM

## 2010-11-05 DIAGNOSIS — Z0000 Encounter for general adult medical examination without abnormal findings: Secondary | ICD-10-CM

## 2010-11-05 LAB — BASIC METABOLIC PANEL
BUN: 26 mg/dL — ABNORMAL HIGH (ref 6–23)
Calcium: 9.2 mg/dL (ref 8.4–10.5)
Chloride: 105 mEq/L (ref 96–112)
Creatinine, Ser: 1.1 mg/dL (ref 0.4–1.2)
Glucose, Bld: 105 mg/dL — ABNORMAL HIGH (ref 70–99)
Sodium: 140 mEq/L (ref 135–145)

## 2010-11-05 LAB — CBC WITH DIFFERENTIAL/PLATELET
Basophils Absolute: 0 10*3/uL (ref 0.0–0.1)
Eosinophils Absolute: 0.3 10*3/uL (ref 0.0–0.7)
Eosinophils Relative: 4.9 % (ref 0.0–5.0)
Hemoglobin: 13.4 g/dL (ref 12.0–15.0)
Monocytes Absolute: 0.5 10*3/uL (ref 0.1–1.0)
Monocytes Relative: 10.3 % (ref 3.0–12.0)
Neutrophils Relative %: 46.9 % (ref 43.0–77.0)
Platelets: 236 10*3/uL (ref 150.0–400.0)
RBC: 4.04 Mil/uL (ref 3.87–5.11)
WBC: 5.3 10*3/uL (ref 4.5–10.5)

## 2010-11-05 LAB — POCT URINALYSIS DIPSTICK
Bilirubin, UA: NEGATIVE
Blood, UA: NEGATIVE
Glucose, UA: NEGATIVE
Nitrite, UA: NEGATIVE
Protein, UA: NEGATIVE
Spec Grav, UA: 1.015
Urobilinogen, UA: 0.2
pH, UA: 6

## 2010-11-05 LAB — HEPATIC FUNCTION PANEL
AST: 24 U/L (ref 0–37)
Albumin: 3.8 g/dL (ref 3.5–5.2)
Alkaline Phosphatase: 36 U/L — ABNORMAL LOW (ref 39–117)
Total Bilirubin: 1 mg/dL (ref 0.3–1.2)

## 2010-11-05 LAB — LIPID PANEL
Triglycerides: 67 mg/dL (ref 0.0–149.0)
VLDL: 13.4 mg/dL (ref 0.0–40.0)

## 2010-11-11 ENCOUNTER — Encounter: Payer: Self-pay | Admitting: Family Medicine

## 2010-11-11 ENCOUNTER — Ambulatory Visit (INDEPENDENT_AMBULATORY_CARE_PROVIDER_SITE_OTHER): Payer: Medicare Other | Admitting: Family Medicine

## 2010-11-11 DIAGNOSIS — Z23 Encounter for immunization: Secondary | ICD-10-CM

## 2010-11-11 DIAGNOSIS — N952 Postmenopausal atrophic vaginitis: Secondary | ICD-10-CM

## 2010-11-11 DIAGNOSIS — Z0000 Encounter for general adult medical examination without abnormal findings: Secondary | ICD-10-CM

## 2010-11-11 DIAGNOSIS — I10 Essential (primary) hypertension: Secondary | ICD-10-CM

## 2010-11-11 DIAGNOSIS — R002 Palpitations: Secondary | ICD-10-CM

## 2010-11-11 DIAGNOSIS — E785 Hyperlipidemia, unspecified: Secondary | ICD-10-CM

## 2010-11-11 MED ORDER — ESTROGENS, CONJUGATED 0.625 MG/GM VA CREA: 1.0000 g | TOPICAL_CREAM | Freq: Every day | VAGINAL | Status: DC

## 2010-11-11 MED ORDER — ATENOLOL 25 MG PO TABS: ORAL_TABLET | ORAL | Status: DC

## 2010-11-11 MED ORDER — SIMVASTATIN 10 MG PO TABS: 10.0000 mg | ORAL_TABLET | Freq: Every day | ORAL | Status: DC

## 2010-11-11 NOTE — Patient Instructions (Signed)
Continue your current medications.  Follow-up in one year or sooner if any problems.  I strongly recommend the shingles of vaccine

## 2010-11-11 NOTE — Progress Notes (Signed)
  Subjective:    Patient ID: Christine Henderson, female    DOB: 1930/05/14, 77 y.o.   MRN: [id number]  HPI [name] is a 77 year old, married female, nonsmoker, who comes in today for her Medicare wellness examination because of a history of underlying hypertension, postmenopausal vaginal dryness, hyperlipidemia, and palpitations.  Her hypertension is treated with Tenormin 12.5 mg daily.  BP 160/90.  However, her blood pressure home is 135/85.  She uses Premarin vaginal cream twice weekly for vaginal dryness.  She takes Zocor 10 mg nightly for hyperlipidemia, along with an 81 mg, baby aspirin.  She takes calcium, vitamin D, and walks on a regular basis.  She has a history of osteopenia.  ,,She gets routine eye care,,,,,,,,,,,,,, recent cataract extraction and lens implants,,,,, hearing normal, regular dental care, mammogram annually, colonoscopy, 2005, bone density 2010, tetanus, 2009, Pneumovax, x 2, seasonal flu shot today, information given on shingles.  She functions independently walks on a regular basis.  Cognitive function, normal.  Home health safety reviewed.  No issues identified, no guns in the house, does have a living will and a healthcare power of attorney   Review of Systems  Constitutional: Negative.   HENT: Negative.   Eyes: Negative.   Respiratory: Negative.   Cardiovascular: Negative.   Gastrointestinal: Negative.   Genitourinary: Negative.   Musculoskeletal: Negative.   Neurological: Negative.   Hematological: Negative.   Psychiatric/Behavioral: Negative.        Objective:   Physical Exam  Constitutional: She appears well-developed and well-nourished.  HENT:  Head: Normocephalic and atraumatic.  Right Ear: External ear normal.  Left Ear: External ear normal.  Nose: Nose normal.  Mouth/Throat: Oropharynx is clear and moist.  Eyes: EOM are normal. Pupils are equal, round, and reactive to light.  Neck: Normal range of motion. Neck supple. No thyromegaly  present.  Cardiovascular: Normal rate, regular rhythm, normal heart sounds and intact distal pulses.  Exam reveals no gallop and no friction rub.   No murmur heard. Pulmonary/Chest: Effort normal and breath sounds normal.  Abdominal: Soft. Bowel sounds are normal. She exhibits no distension and no mass. There is no tenderness. There is no rebound.  Genitourinary:       Bilateral breast exam normal except for a cystic lesion on the right breast at 12 o'clock soft, rubbery, movable  Musculoskeletal: Normal range of motion.  Lymphadenopathy:    She has no cervical adenopathy.  Neurological: She is alert. She has normal reflexes. No cranial nerve deficit. She exhibits normal muscle tone. Coordination normal.  Skin: Skin is warm and dry.  Psychiatric: She has a normal mood and affect. Her behavior is normal. Judgment and thought content normal.          Assessment & Plan:  Healthy female.  Hypertension.  Continue Tenormin 12.5 daily.  Postmenopausal vaginal dryness.  Continue Premarin vaginal cream.  Hyperlipidemia.  Continue Zocor and aspirin.

## 2011-08-13 ENCOUNTER — Emergency Department (HOSPITAL_BASED_OUTPATIENT_CLINIC_OR_DEPARTMENT_OTHER)
Admission: EM | Admit: 2011-08-13 | Discharge: 2011-08-13 | Disposition: A | Discharge status: Home / Self Care | Payer: Medicare Other | Attending: Emergency Medicine | Admitting: Emergency Medicine

## 2011-08-13 ENCOUNTER — Encounter (HOSPITAL_BASED_OUTPATIENT_CLINIC_OR_DEPARTMENT_OTHER): Payer: Self-pay | Admitting: *Deleted

## 2011-08-13 DIAGNOSIS — W268XXA Contact with other sharp object(s), not elsewhere classified, initial encounter: Secondary | ICD-10-CM | POA: Insufficient documentation

## 2011-08-13 DIAGNOSIS — S61219A Laceration without foreign body of unspecified finger without damage to nail, initial encounter: Secondary | ICD-10-CM

## 2011-08-13 DIAGNOSIS — Z79899 Other long term (current) drug therapy: Secondary | ICD-10-CM | POA: Insufficient documentation

## 2011-08-13 DIAGNOSIS — N952 Postmenopausal atrophic vaginitis: Secondary | ICD-10-CM | POA: Insufficient documentation

## 2011-08-13 DIAGNOSIS — Z7982 Long term (current) use of aspirin: Secondary | ICD-10-CM | POA: Insufficient documentation

## 2011-08-13 DIAGNOSIS — S61209A Unspecified open wound of unspecified finger without damage to nail, initial encounter: Secondary | ICD-10-CM | POA: Insufficient documentation

## 2011-08-13 DIAGNOSIS — E7800 Pure hypercholesterolemia, unspecified: Secondary | ICD-10-CM | POA: Insufficient documentation

## 2011-08-13 DIAGNOSIS — Z885 Allergy status to narcotic agent status: Secondary | ICD-10-CM | POA: Insufficient documentation

## 2011-08-13 DIAGNOSIS — I10 Essential (primary) hypertension: Secondary | ICD-10-CM | POA: Insufficient documentation

## 2011-08-13 MED ORDER — LIDOCAINE HCL 2 % IJ SOLN
INTRAMUSCULAR | Status: AC
  Filled 2011-08-13: qty 1

## 2011-08-13 NOTE — ED Notes (Signed)
Pt c/o laceration to left middle finger by plastic x 30 mins ago

## 2011-08-13 NOTE — Discharge Instructions (Signed)

## 2011-08-13 NOTE — ED Provider Notes (Signed)
History     CSN: [id number]  Arrival date & time 08/13/11  2137   First MD Initiated Contact with Patient 08/13/11 2234      Chief Complaint  Patient presents with  . Laceration    (Consider location/radiation/quality/duration/timing/severity/associated sxs/prior treatment) Patient is a 78 y.o. female presenting with skin laceration. The history is provided by the patient.  Laceration  The incident occurred less than 1 hour ago. Pain location: left middle finger. The laceration is 1 cm in size. Injury mechanism: sharp plastic edge. The pain is mild. The pain has been constant since onset. She reports no foreign bodies present. Her tetanus status is UTD.    Past Medical History  Diagnosis Date  . HYPERLIPIDEMIA 10/01/2006  . MIGRAINE HEADACHE 09/10/2006  . PHOTOPHOBIA 07/20/2009  . HYPERTENSION 10/06/2007  . ALLERGIC RHINITIS CAUSE UNSPECIFIED 02/02/2008  . Irritable bowel syndrome 09/10/2006  . VAGINITIS, ATROPHIC 10/01/2006  . SKIN TAG 11/09/2006  . OSTEOPENIA 09/10/2006  . Headache 10/01/2006  . Palpitations 09/10/2006  . Diarrhea 06/26/2008  . Abdominal pain, unspecified site 06/26/2008  . Fibrocystic breast disease   . IBS (irritable bowel syndrome)   . MVP (mitral valve prolapse)     Past Surgical History  Procedure Date  . Rotator cuff repair     right  . Tonsillectomy     Family History  Problem Relation Age of Onset  . Inflammatory bowel disease Sister   . Cancer Maternal Grandfather     pancratic  . Coronary artery disease Neg Hx     History  Substance Use Topics  . Smoking status: Never Smoker   . Smokeless tobacco: Not on file  . Alcohol Use: No    OB History    Grav Para Term Preterm Abortions TAB SAB Ect Mult Living   4 4 0 0 0 0 0 0 0 0       Review of Systems  All other systems reviewed and are negative.    Allergies  Codeine  Home Medications   Current Outpatient Rx  Name Route Sig Dispense Refill  . VITAMIN C 1000 MG PO TABS Oral Take  1,000 mg by mouth daily.      . ASPIRIN 81 MG PO TABS Oral Take 81 mg by mouth daily.      . ATENOLOL 25 MG PO TABS  Half tab daily 50 tablet 3  . CALCIUM PLUS VITAMIN D PO Oral Take 1,200 tablets by mouth daily.      [name] Kitchen ESTROGENS, CONJUGATED 0.625 MG/GM VA CREA Vaginal Place 0.5 Applicatorfuls vaginally daily. 42.5 g 11  . OMEGA-3 FATTY ACIDS 1000 MG PO CAPS Oral Take 2 g by mouth daily.      [name] Kitchen GLUCOSAMINE 1500 COMPLEX PO CAPS Oral Take 1,500 capsules by mouth 2 (two) times daily.      [name] Kitchen LOPERAMIDE HCL 2 MG PO CAPS Oral Take 2 mg by mouth 4 (four) times daily as needed.      [name] Kitchen ONE-DAILY MULTI VITAMINS PO TABS Oral Take 1 tablet by mouth daily.      [name] Kitchen SIMVASTATIN 10 MG PO TABS Oral Take 1 tablet (10 mg total) by mouth at bedtime. 100 tablet 3    BP 177/92  Pulse 80  Temp 97.7 F (36.5 C) (Oral)  Resp 16  Ht 5\' 5"  (1.651 m)  Wt 125 lb (56.7 kg)  BMI 20.80 kg/m2  SpO2 100%  Physical Exam  Nursing note and vitals reviewed. Constitutional: She is oriented to person,  place, and time. She appears well-developed and well-nourished.  HENT:  Head: Normocephalic and atraumatic.  Neck: Normal range of motion. Neck supple.  Musculoskeletal:       There is a 1 cm laceration to the middle phalanx of the the left middle finger.    Neurological: She is alert and oriented to person, place, and time.  Skin: Skin is warm and dry.    ED Course  Procedures (including critical care time)  Labs Reviewed - No data to display No results found.   No diagnosis found.  LACERATION REPAIR Performed by: Geoffery Lyons Authorized by: Geoffery Lyons Consent: Verbal consent obtained. Risks and benefits: risks, benefits and alternatives were discussed Consent given by: patient Patient identity confirmed: provided demographic data Prepped and Draped in normal sterile fashion Wound explored  Laceration Location: left middle finger  Laceration Length: 1cm  No Foreign Bodies seen or  palpated  Anesthesia: local infiltration  Local anesthetic: none  Anesthetic total: none  Irrigation method: syringe Amount of cleaning: standard  Skin closure: Dermabond  Number of sutures: n/a  Technique: dermabond  Patient tolerance: Patient tolerated the procedure well with no immediate complications.   MDM  Return prn.        Geoffery Lyons, MD 08/13/11 2242

## 2011-09-17 DIAGNOSIS — Z800 Family history of malignant neoplasm of digestive organs: Secondary | ICD-10-CM | POA: Insufficient documentation

## 2011-09-17 DIAGNOSIS — M949 Disorder of cartilage, unspecified: Secondary | ICD-10-CM | POA: Insufficient documentation

## 2011-09-17 DIAGNOSIS — E785 Hyperlipidemia, unspecified: Secondary | ICD-10-CM | POA: Insufficient documentation

## 2011-09-17 DIAGNOSIS — I059 Rheumatic mitral valve disease, unspecified: Secondary | ICD-10-CM | POA: Insufficient documentation

## 2011-09-17 DIAGNOSIS — R42 Dizziness and giddiness: Secondary | ICD-10-CM | POA: Insufficient documentation

## 2011-09-17 DIAGNOSIS — Z7982 Long term (current) use of aspirin: Secondary | ICD-10-CM | POA: Insufficient documentation

## 2011-09-17 DIAGNOSIS — K589 Irritable bowel syndrome without diarrhea: Secondary | ICD-10-CM | POA: Insufficient documentation

## 2011-09-17 DIAGNOSIS — M899 Disorder of bone, unspecified: Secondary | ICD-10-CM | POA: Insufficient documentation

## 2011-09-17 DIAGNOSIS — I10 Essential (primary) hypertension: Secondary | ICD-10-CM | POA: Insufficient documentation

## 2011-09-17 NOTE — ED Notes (Signed)
Around 10:30 this evening, pt felt nauseous and dizzy, checked BP and it was 200 systolic.  No LOC, vomiting, no arm drift, slurred speech, facial droop

## 2011-09-18 ENCOUNTER — Encounter (HOSPITAL_COMMUNITY): Payer: Self-pay | Admitting: *Deleted

## 2011-09-18 ENCOUNTER — Emergency Department (HOSPITAL_COMMUNITY): Payer: Medicare Other

## 2011-09-18 ENCOUNTER — Emergency Department (HOSPITAL_COMMUNITY)
Admission: EM | Admit: 2011-09-18 | Discharge: 2011-09-18 | Disposition: A | Discharge status: Home / Self Care | Payer: Medicare Other | Attending: Emergency Medicine | Admitting: Emergency Medicine

## 2011-09-18 DIAGNOSIS — N390 Urinary tract infection, site not specified: Secondary | ICD-10-CM

## 2011-09-18 DIAGNOSIS — I10 Essential (primary) hypertension: Secondary | ICD-10-CM

## 2011-09-18 LAB — URINALYSIS, ROUTINE W REFLEX MICROSCOPIC
Bilirubin Urine: NEGATIVE
Glucose, UA: NEGATIVE mg/dL
Hgb urine dipstick: NEGATIVE
Ketones, ur: NEGATIVE mg/dL
Nitrite: NEGATIVE
Specific Gravity, Urine: 1.015 (ref 1.005–1.030)
pH: 6.5 (ref 5.0–8.0)

## 2011-09-18 LAB — URINE MICROSCOPIC-ADD ON

## 2011-09-18 LAB — POCT I-STAT, CHEM 8
BUN: 23 mg/dL (ref 6–23)
Chloride: 105 mEq/L (ref 96–112)
Creatinine, Ser: 1 mg/dL (ref 0.50–1.10)
Glucose, Bld: 120 mg/dL — ABNORMAL HIGH (ref 70–99)
HCT: 44 % (ref 36.0–46.0)
Potassium: 3.9 mEq/L (ref 3.5–5.1)

## 2011-09-18 LAB — CBC
HCT: 40.4 % (ref 36.0–46.0)
Hemoglobin: 14 g/dL (ref 12.0–15.0)
MCH: 32.7 pg (ref 26.0–34.0)
MCV: 94.4 fL (ref 78.0–100.0)
RBC: 4.28 MIL/uL (ref 3.87–5.11)
WBC: 8.7 10*3/uL (ref 4.0–10.5)

## 2011-09-18 MED ORDER — ONDANSETRON HCL 4 MG PO TABS: 4.0000 mg | ORAL_TABLET | Freq: Four times a day (QID) | ORAL | Status: AC

## 2011-09-18 MED ORDER — ATENOLOL 25 MG PO TABS
12.5000 mg | ORAL_TABLET | Freq: Once | ORAL | Status: AC
  Administered 2011-09-18: 12.5 mg via ORAL
  Filled 2011-09-18: qty 1

## 2011-09-18 MED ORDER — LIDOCAINE HCL (PF) 1 % IJ SOLN
INTRAMUSCULAR | Status: AC
  Filled 2011-09-18: qty 5

## 2011-09-18 MED ORDER — DEXTROSE 5 % IV SOLN: 1.0000 g | Freq: Once | INTRAVENOUS | Status: DC

## 2011-09-18 MED ORDER — CEFTRIAXONE SODIUM 1 G IJ SOLR
1.0000 g | Freq: Once | INTRAMUSCULAR | Status: AC
  Administered 2011-09-18: 1 g via INTRAMUSCULAR
  Filled 2011-09-18: qty 10

## 2011-09-18 MED ORDER — CEFPODOXIME PROXETIL 200 MG PO TABS: 200.0000 mg | ORAL_TABLET | Freq: Two times a day (BID) | ORAL | Status: AC

## 2011-09-18 NOTE — ED Provider Notes (Signed)
History     CSN: [id number]  Arrival date & time 09/17/11  2349   First MD Initiated Contact with Patient 09/18/11 0143      Chief Complaint  Patient presents with  . Hypertension  . Dizziness    (Consider location/radiation/quality/duration/timing/severity/associated sxs/prior treatment) Patient is a 78 y.o. female presenting with hypertension.  Hypertension Pertinent negatives include no chest pain, no abdominal pain, no headaches and no shortness of breath.   History provided by patient. At home in a normal state of health and watching TV, when she got up felt somewhat nauseous and dizzy and decided to check her blood pressure. Initially it was 160 systolic and elevated per her. She continued to recheck it and it continued to get hired to systolic of 200. she became anxious. No chest pain. No shortness of breath. No weakness or numbness. Is prescribed atenolol for blood pressure but states that sometimes it also drops as low as 100 systolic.  She has currently been under a good deal of stress - her husband is undergoing workup for enlarged lymph nodes and possible cancer. No headache. No change in urine or difficulty urinating. Symptoms mild/moderate in severity. Nausea and dizziness are currently on declines any medications. Past Medical History  Diagnosis Date  . HYPERLIPIDEMIA 10/01/2006  . MIGRAINE HEADACHE 09/10/2006  . PHOTOPHOBIA 07/20/2009  . HYPERTENSION 10/06/2007  . ALLERGIC RHINITIS CAUSE UNSPECIFIED 02/02/2008  . Irritable bowel syndrome 09/10/2006  . VAGINITIS, ATROPHIC 10/01/2006  . SKIN TAG 11/09/2006  . OSTEOPENIA 09/10/2006  . Headache 10/01/2006  . Palpitations 09/10/2006  . Diarrhea 06/26/2008  . Abdominal pain, unspecified site 06/26/2008  . Fibrocystic breast disease   . IBS (irritable bowel syndrome)   . MVP (mitral valve prolapse)     Past Surgical History  Procedure Date  . Rotator cuff repair     right  . Tonsillectomy     Family History  Problem  Relation Age of Onset  . Inflammatory bowel disease Sister   . Cancer Maternal Grandfather     pancratic  . Coronary artery disease Neg Hx     History  Substance Use Topics  . Smoking status: Never Smoker   . Smokeless tobacco: Not on file  . Alcohol Use: No    OB History    Grav Para Term Preterm Abortions TAB SAB Ect Mult Living   4 4 0 0 0 0 0 0 0 0       Review of Systems  Constitutional: Negative for fever and chills.  HENT: Negative for neck pain and neck stiffness.   Eyes: Negative for pain.  Respiratory: Negative for shortness of breath.   Cardiovascular: Negative for chest pain.  Gastrointestinal: Negative for abdominal pain.  Genitourinary: Negative for dysuria.  Musculoskeletal: Negative for back pain.  Skin: Negative for rash.  Neurological: Positive for dizziness. Negative for headaches.  All other systems reviewed and are negative.    Allergies  Codeine  Home Medications   Current Outpatient Rx  Name Route Sig Dispense Refill  . VITAMIN C 1000 MG PO TABS Oral Take 1,000 mg by mouth daily.      . ASPIRIN 81 MG PO TABS Oral Take 81 mg by mouth daily.      . ATENOLOL 25 MG PO TABS Oral Take by mouth daily. Half tab daily    . CALCIUM PLUS VITAMIN D PO Oral Take 1,200 tablets by mouth daily.      [name] Kitchen ESTROGENS, CONJUGATED 0.625 MG/GM VA CREA Vaginal  Place 0.5 g vaginally daily.    . OMEGA-3 FATTY ACIDS 1000 MG PO CAPS Oral Take 2 g by mouth daily.      [name] Kitchen GLUCOSAMINE 1500 COMPLEX PO CAPS Oral Take 1,500 capsules by mouth 2 (two) times daily.      [name] Kitchen LOPERAMIDE HCL 2 MG PO CAPS Oral Take 2 mg by mouth 4 (four) times daily as needed. For diarrhea    . ONE-DAILY MULTI VITAMINS PO TABS Oral Take 1 tablet by mouth daily.      [name] Kitchen SIMVASTATIN 10 MG PO TABS Oral Take 1 tablet (10 mg total) by mouth at bedtime. 100 tablet 3    BP 170/97  Pulse 67  Temp 97.5 F (36.4 C) (Oral)  Resp 18  SpO2 98%  Physical Exam  Constitutional: She is oriented to person, place,  and time. She appears well-developed and well-nourished.  HENT:  Head: Normocephalic and atraumatic.  Eyes: Conjunctivae and EOM are normal. Pupils are equal, round, and reactive to light.  Neck: Trachea normal. Neck supple. No thyromegaly present.  Cardiovascular: Normal rate, regular rhythm, S1 normal, S2 normal and normal pulses.     No systolic murmur is present   No diastolic murmur is present  Pulses:      Radial pulses are 2+ on the right side, and 2+ on the left side.  Pulmonary/Chest: Effort normal and breath sounds normal. She has no wheezes. She has no rhonchi. She has no rales. She exhibits no tenderness.  Abdominal: Soft. Normal appearance and bowel sounds are normal. There is no tenderness. There is no CVA tenderness and negative Murphy's sign.  Musculoskeletal:       BLE:s Calves nontender, no cords or erythema, negative Homans sign  Neurological: She is alert and oriented to person, place, and time. She has normal strength. No cranial nerve deficit or sensory deficit. GCS eye subscore is 4. GCS verbal subscore is 5. GCS motor subscore is 6.  Skin: Skin is warm and dry. No rash noted. She is not diaphoretic.  Psychiatric: Her speech is normal.       Cooperative and appropriate    ED Course  Procedures (including critical care time)  Results for orders placed during the hospital encounter of 09/18/11  CBC      Component Value Range   WBC 8.7  4.0 - 10.5 K/uL   RBC 4.28  3.87 - 5.11 MIL/uL   Hemoglobin 14.0  12.0 - 15.0 g/dL   HCT 16.1  09.6 - 04.5 %   MCV 94.4  78.0 - 100.0 fL   MCH 32.7  26.0 - 34.0 pg   MCHC 34.7  30.0 - 36.0 g/dL   RDW 40.9  81.1 - 91.4 %   Platelets 237  150 - 400 K/uL  URINALYSIS, ROUTINE W REFLEX MICROSCOPIC      Component Value Range   Color, Urine YELLOW  YELLOW   APPearance CLEAR  CLEAR   Specific Gravity, Urine 1.015  1.005 - 1.030   pH 6.5  5.0 - 8.0   Glucose, UA NEGATIVE  NEGATIVE mg/dL   Hgb urine dipstick NEGATIVE  NEGATIVE    Bilirubin Urine NEGATIVE  NEGATIVE   Ketones, ur NEGATIVE  NEGATIVE mg/dL   Protein, ur NEGATIVE  NEGATIVE mg/dL   Urobilinogen, UA 0.2  0.0 - 1.0 mg/dL   Nitrite NEGATIVE  NEGATIVE   Leukocytes, UA SMALL (*) NEGATIVE  POCT I-STAT, CHEM 8      Component Value Range  Sodium 143  135 - 145 mEq/L   Potassium 3.9  3.5 - 5.1 mEq/L   Chloride 105  96 - 112 mEq/L   BUN 23  6 - 23 mg/dL   Creatinine, Ser 1.61  0.50 - 1.10 mg/dL   Glucose, Bld 096 (*) 70 - 99 mg/dL   Calcium, Ion 0.45  4.09 - 1.30 mmol/L   TCO2 24  0 - 100 mmol/L   Hemoglobin 15.0  12.0 - 15.0 g/dL   HCT 81.1  91.4 - 78.2 %  URINE MICROSCOPIC-ADD ON      Component Value Range   Squamous Epithelial / LPF RARE  RARE   WBC, UA 7-10  <3 WBC/hpf   RBC / HPF 0-2  <3 RBC/hpf   Bacteria, UA FEW (*) RARE   Dg Chest Portable 1 View  09/18/2011  *RADIOLOGY REPORT*  Clinical Data: Hypertension and dizziness.  PORTABLE CHEST - 1 VIEW  Comparison: None.  Findings: Mild cardiac enlargement with borderline pulmonary vascularity.  Early vascular congestion is not excluded.  No edema or consolidation.  No blunting of costophrenic angles.  No pneumothorax.  Tortuous aorta.  Degenerative changes in the spine and shoulders. Emphysematous changes in the lungs.  Slight fibrosis in the lung bases.  IMPRESSION: Mild cardiac enlargement with possible early pulmonary vascular congestion.  No edema.  Emphysematous changes.  Original Report Authenticated By: Marlon Pel, M.D.    Results for orders placed during the hospital encounter of 09/18/11  CBC      Component Value Range   WBC 8.7  4.0 - 10.5 K/uL   RBC 4.28  3.87 - 5.11 MIL/uL   Hemoglobin 14.0  12.0 - 15.0 g/dL   HCT 95.6  21.3 - 08.6 %   MCV 94.4  78.0 - 100.0 fL   MCH 32.7  26.0 - 34.0 pg   MCHC 34.7  30.0 - 36.0 g/dL   RDW 57.8  46.9 - 62.9 %   Platelets 237  150 - 400 K/uL  URINALYSIS, ROUTINE W REFLEX MICROSCOPIC      Component Value Range   Color, Urine YELLOW  YELLOW    APPearance CLEAR  CLEAR   Specific Gravity, Urine 1.015  1.005 - 1.030   pH 6.5  5.0 - 8.0   Glucose, UA NEGATIVE  NEGATIVE mg/dL   Hgb urine dipstick NEGATIVE  NEGATIVE   Bilirubin Urine NEGATIVE  NEGATIVE   Ketones, ur NEGATIVE  NEGATIVE mg/dL   Protein, ur NEGATIVE  NEGATIVE mg/dL   Urobilinogen, UA 0.2  0.0 - 1.0 mg/dL   Nitrite NEGATIVE  NEGATIVE   Leukocytes, UA SMALL (*) NEGATIVE  POCT I-STAT, CHEM 8      Component Value Range   Sodium 143  135 - 145 mEq/L   Potassium 3.9  3.5 - 5.1 mEq/L   Chloride 105  96 - 112 mEq/L   BUN 23  6 - 23 mg/dL   Creatinine, Ser 5.28  0.50 - 1.10 mg/dL   Glucose, Bld 413 (*) 70 - 99 mg/dL   Calcium, Ion 2.44  0.10 - 1.30 mmol/L   TCO2 24  0 - 100 mmol/L   Hemoglobin 15.0  12.0 - 15.0 g/dL   HCT 27.2  53.6 - 64.4 %  URINE MICROSCOPIC-ADD ON      Component Value Range   Squamous Epithelial / LPF RARE  RARE   WBC, UA 7-10  <3 WBC/hpf   RBC / HPF 0-2  <3 RBC/hpf  Bacteria, UA FEW (*) RARE   Dg Chest Portable 1 View  09/18/2011  *RADIOLOGY REPORT*  Clinical Data: Hypertension and dizziness.  PORTABLE CHEST - 1 VIEW  Comparison: None.  Findings: Mild cardiac enlargement with borderline pulmonary vascularity.  Early vascular congestion is not excluded.  No edema or consolidation.  No blunting of costophrenic angles.  No pneumothorax.  Tortuous aorta.  Degenerative changes in the spine and shoulders. Emphysematous changes in the lungs.  Slight fibrosis in the lung bases.  IMPRESSION: Mild cardiac enlargement with possible early pulmonary vascular congestion.  No edema.  Emphysematous changes.  Original Report Authenticated By: Marlon Pel, M.D.    Date: 09/18/2011  Rate: 86  Rhythm: normal sinus rhythm  QRS Axis: normal  Intervals: PR prolonged  ST/T Wave abnormalities: nonspecific ST changes  Conduction Disutrbances:first-degree A-V block   Narrative Interpretation:   Old EKG Reviewed: none available  PO fluids. Atenolol PO for blood  pressure. EKG, x-ray, labs and UA obtained and reviewed as above. Antibiotics provided for possible UTI. Urine culture sent and pending.  Recheck at 4:40 AM, blood pressure improved to 151/82. Patient remains asymptomatic and wishes to be discharged home. Presentation does not suggest ACS. No hypertensive emergency. Reliable historian verbalizes understanding strict return precautions for any worsening condition. Stable for discharge home at this time.  MDM   Elevated blood pressure with workup as above. Nursing notes reviewed. Vital signs reviewed. Old records reviewed. Prescription for antibiotics provided and plan close primary care followup for urine culture results and recheck blood pressure.        Sunnie Nielsen, MD 09/18/11 [phone]

## 2011-09-19 ENCOUNTER — Encounter: Payer: Self-pay | Admitting: Family

## 2011-09-19 ENCOUNTER — Ambulatory Visit (INDEPENDENT_AMBULATORY_CARE_PROVIDER_SITE_OTHER): Payer: Medicare Other | Admitting: Family

## 2011-09-19 VITALS — BP 146/80 | HR 95 | Temp 98.7°F | Wt 129.0 lb

## 2011-09-19 DIAGNOSIS — N390 Urinary tract infection, site not specified: Secondary | ICD-10-CM

## 2011-09-19 DIAGNOSIS — I10 Essential (primary) hypertension: Secondary | ICD-10-CM

## 2011-09-19 LAB — URINE CULTURE

## 2011-09-19 NOTE — Progress Notes (Signed)
Subjective:    Patient ID: Christine Henderson, female    DOB: 1931/01/16, 78 y.o.   MRN: [id number]  HPI  78 year old white female, nonsmoker, patient of Dr. Tawanna Cooler is in for an ER followup. She was seen in the emergency department 2 days ago was diagnosed with urinary tract infection after having symptoms of nausea, lightheadedness and dizziness. She has a history of hypertension and her blood pressure was also elevated at the ER. He has since, return to normal. Is currently taken Zofran for nausea and Cefpodoxime for her urinary tract infection. She reports also receiving an injection of antibiotics.  Review of Systems  Constitutional: Negative.   Respiratory: Negative.   Cardiovascular: Negative.   Gastrointestinal: Negative.   Genitourinary: Negative.   Musculoskeletal: Negative.   Neurological: Negative.   Hematological: Negative.   Psychiatric/Behavioral: Negative.    Past Medical History  Diagnosis Date  . HYPERLIPIDEMIA 10/01/2006  . MIGRAINE HEADACHE 09/10/2006  . PHOTOPHOBIA 07/20/2009  . HYPERTENSION 10/06/2007  . ALLERGIC RHINITIS CAUSE UNSPECIFIED 02/02/2008  . Irritable bowel syndrome 09/10/2006  . VAGINITIS, ATROPHIC 10/01/2006  . SKIN TAG 11/09/2006  . OSTEOPENIA 09/10/2006  . Headache 10/01/2006  . Palpitations 09/10/2006  . Diarrhea 06/26/2008  . Abdominal pain, unspecified site 06/26/2008  . Fibrocystic breast disease   . IBS (irritable bowel syndrome)   . MVP (mitral valve prolapse)     History   Social History  . Marital Status: Married    Spouse Name: N/A    Number of Children: N/A  . Years of Education: N/A   Occupational History  . Not on file.   Social History Main Topics  . Smoking status: Never Smoker   . Smokeless tobacco: Not on file  . Alcohol Use: No  . Drug Use: No  . Sexually Active: Yes    Birth Control/ Protection: None     regular exercise   Other Topics Concern  . Not on file   Social History Narrative  . No narrative on file    Past  Surgical History  Procedure Date  . Rotator cuff repair     right  . Tonsillectomy     Family History  Problem Relation Age of Onset  . Inflammatory bowel disease Sister   . Cancer Maternal Grandfather     pancratic  . Coronary artery disease Neg Hx     Allergies  Allergen Reactions  . Codeine     REACTION: Nausea  Vomiting    Current Outpatient Prescriptions on File Prior to Visit  Medication Sig Dispense Refill  . Ascorbic Acid (VITAMIN C) 1000 MG tablet Take 1,000 mg by mouth daily.        [name] Kitchen aspirin 81 MG tablet Take 81 mg by mouth daily.        [name] Kitchen atenolol (TENORMIN) 25 MG tablet Take by mouth daily. Half tab daily      . Calcium Carbonate-Vitamin D (CALCIUM PLUS VITAMIN D PO) Take 1,200 tablets by mouth daily.        . cefpodoxime (VANTIN) 200 MG tablet Take 1 tablet (200 mg total) by mouth 2 (two) times daily.  14 tablet  0  . conjugated estrogens (PREMARIN) vaginal cream Place 0.5 g vaginally daily.      . fish oil-omega-3 fatty acids 1000 MG capsule Take 2 g by mouth daily.        . Glucosamine-Chondroit-Vit C-Mn (GLUCOSAMINE 1500 COMPLEX) CAPS Take 1,500 capsules by mouth 2 (two) times daily.        [name] Kitchen  loperamide (IMODIUM) 2 MG capsule Take 2 mg by mouth 4 (four) times daily as needed. For diarrhea      . Multiple Vitamin (MULTIVITAMIN) tablet Take 1 tablet by mouth daily.        . ondansetron (ZOFRAN) 4 MG tablet Take 1 tablet (4 mg total) by mouth every 6 (six) hours.  12 tablet  0  . simvastatin (ZOCOR) 10 MG tablet Take 1 tablet (10 mg total) by mouth at bedtime.  100 tablet  3   Current Facility-Administered Medications on File Prior to Visit  Medication Dose Route Frequency Provider Last Rate Last Dose  . lidocaine (XYLOCAINE) 1 % injection             BP 146/80  Pulse 95  Temp 98.7 F (37.1 C) (Oral)  Wt 129 lb (58.514 kg)  SpO2 97%chart    Objective:   Physical Exam  Constitutional: She is oriented to person, place, and time. She appears  well-developed and well-nourished.  HENT:  Right Ear: External ear normal.  Left Ear: External ear normal.  Nose: Nose normal.  Mouth/Throat: Oropharynx is clear and moist.  Neck: Normal range of motion. Neck supple.  Cardiovascular: Normal rate, regular rhythm and normal heart sounds.   Pulmonary/Chest: Effort normal and breath sounds normal.  Abdominal: Soft. Bowel sounds are normal.  Musculoskeletal: Normal range of motion.  Neurological: She is alert and oriented to person, place, and time.  Skin: Skin is warm and dry.  Psychiatric: She has a normal mood and affect.          Assessment & Plan:  Assessment: Urinary tract infection-resolving, hypertension  Plan: Continue current medications. Follow up PCP as scheduled and sooner when necessary.

## 2011-12-01 ENCOUNTER — Other Ambulatory Visit (INDEPENDENT_AMBULATORY_CARE_PROVIDER_SITE_OTHER): Payer: Medicare Other

## 2011-12-01 DIAGNOSIS — Z0000 Encounter for general adult medical examination without abnormal findings: Secondary | ICD-10-CM

## 2011-12-01 DIAGNOSIS — I10 Essential (primary) hypertension: Secondary | ICD-10-CM

## 2011-12-01 LAB — CBC WITH DIFFERENTIAL/PLATELET
Basophils Absolute: 0 10*3/uL (ref 0.0–0.1)
Eosinophils Absolute: 0.6 10*3/uL (ref 0.0–0.7)
HCT: 42.2 % (ref 36.0–46.0)
Hemoglobin: 13.9 g/dL (ref 12.0–15.0)
Lymphocytes Relative: 33.5 % (ref 12.0–46.0)
Lymphs Abs: 2.2 10*3/uL (ref 0.7–4.0)
MCHC: 32.8 g/dL (ref 30.0–36.0)
Neutro Abs: 3.1 10*3/uL (ref 1.4–7.7)
RDW: 14.2 % (ref 11.5–14.6)

## 2011-12-01 LAB — POCT URINALYSIS DIPSTICK
Glucose, UA: NEGATIVE
Nitrite, UA: NEGATIVE
Urobilinogen, UA: 0.2

## 2011-12-01 LAB — BASIC METABOLIC PANEL
BUN: 25 mg/dL — ABNORMAL HIGH (ref 6–23)
CO2: 26 mEq/L (ref 19–32)
Calcium: 9.4 mg/dL (ref 8.4–10.5)
Glucose, Bld: 104 mg/dL — ABNORMAL HIGH (ref 70–99)
Sodium: 139 mEq/L (ref 135–145)

## 2011-12-01 LAB — LIPID PANEL
Cholesterol: 165 mg/dL (ref 0–200)
Total CHOL/HDL Ratio: 2
Triglycerides: 54 mg/dL (ref 0.0–149.0)

## 2011-12-01 LAB — HEPATIC FUNCTION PANEL: Albumin: 3.8 g/dL (ref 3.5–5.2)

## 2011-12-01 LAB — TSH: TSH: 3.31 u[IU]/mL (ref 0.35–5.50)

## 2011-12-08 ENCOUNTER — Encounter: Payer: Self-pay | Admitting: Family Medicine

## 2011-12-08 ENCOUNTER — Ambulatory Visit (INDEPENDENT_AMBULATORY_CARE_PROVIDER_SITE_OTHER): Payer: Medicare Other | Admitting: Family Medicine

## 2011-12-08 VITALS — BP 138/84 | HR 68 | Temp 98.2°F | Resp 18 | Wt 128.0 lb

## 2011-12-08 DIAGNOSIS — J309 Allergic rhinitis, unspecified: Secondary | ICD-10-CM

## 2011-12-08 DIAGNOSIS — I10 Essential (primary) hypertension: Secondary | ICD-10-CM

## 2011-12-08 DIAGNOSIS — E785 Hyperlipidemia, unspecified: Secondary | ICD-10-CM

## 2011-12-08 DIAGNOSIS — K589 Irritable bowel syndrome without diarrhea: Secondary | ICD-10-CM

## 2011-12-08 DIAGNOSIS — Z23 Encounter for immunization: Secondary | ICD-10-CM

## 2011-12-08 MED ORDER — ATENOLOL 25 MG PO TABS: ORAL_TABLET | ORAL | Status: DC

## 2011-12-08 MED ORDER — SIMVASTATIN 10 MG PO TABS: 10.0000 mg | ORAL_TABLET | Freq: Every day | ORAL | Status: DC

## 2011-12-08 NOTE — Patient Instructions (Signed)
Continue your current medications  Begin a walking program walk 30 minutes daily  Return in one year sooner if any problem

## 2011-12-08 NOTE — Progress Notes (Signed)
  Subjective:    Patient ID: Christine Henderson, female    DOB: 22-Feb-1931, 78 y.o.   MRN: [id number]  HPI Christine Henderson is a 78 year old married female nonsmoker who comes in today for a Medicare wellness examination  She takes Tenormin 12.5 mg daily for hypertension BP 130/84  She's not using the Premarin cream anymore  She takes 10 mg of Zocor daily and an aspirin tablet because of a history of hyperlipidemia  She gets routine eye care, dental care, BSE monthly, and you mammography, colonoscopy and GI, Pneumovax 2009, tetanus 2009, seasonal flu shot today.  Cognitive function normal she does not exercise on a regular basis, home health safety reviewed no issues identified, no guns in the house, she does have a health care power of attorney and living will   Review of Systems  Constitutional: Negative.   HENT: Negative.   Eyes: Negative.   Respiratory: Negative.   Cardiovascular: Negative.   Gastrointestinal: Negative.   Genitourinary: Negative.   Musculoskeletal: Negative.   Neurological: Negative.   Hematological: Negative.   Psychiatric/Behavioral: Negative.        Objective:   Physical Exam  Constitutional: She appears well-developed and well-nourished.  HENT:  Head: Normocephalic and atraumatic.  Right Ear: External ear normal.  Left Ear: External ear normal.  Nose: Nose normal.  Mouth/Throat: Oropharynx is clear and moist.  Eyes: EOM are normal. Pupils are equal, round, and reactive to light.  Neck: Normal range of motion. Neck supple. No thyromegaly present.  Cardiovascular: Normal rate, regular rhythm, normal heart sounds and intact distal pulses.  Exam reveals no gallop and no friction rub.   No murmur heard. Pulmonary/Chest: Effort normal and breath sounds normal.  Abdominal: Soft. Bowel sounds are normal. She exhibits no distension and no mass. There is no tenderness. There is no rebound.  Genitourinary:       Bilateral breast exam normal  Musculoskeletal: Normal  range of motion.  Lymphadenopathy:    She has no cervical adenopathy.  Neurological: She is alert. She has normal reflexes. No cranial nerve deficit. She exhibits normal muscle tone. Coordination normal.  Skin: Skin is warm and dry.  Psychiatric: She has a normal mood and affect. Her behavior is normal. Judgment and thought content normal.          Assessment & Plan:  Healthy female  Hypertension continue Tenormin 12.5 mg daily  Hyperlipidemia continue Zocor and aspirin check labs

## 2012-11-08 ENCOUNTER — Telehealth: Payer: Self-pay | Admitting: Family Medicine

## 2012-11-08 NOTE — Telephone Encounter (Signed)
Ok per Dr. Todd 

## 2012-11-08 NOTE — Telephone Encounter (Signed)
Patient states that she has moved to [facility] and would like to start seeing Dr. Abner Greenspan. Is this okay? Thanks!

## 2012-11-08 NOTE — Telephone Encounter (Signed)
OK with me.

## 2012-11-08 NOTE — Telephone Encounter (Signed)
Appointment scheduled for 11/12/12. Patient is aware that this is not for cpe, just a transfer appointment from Dr. Tawanna Cooler.

## 2012-11-12 ENCOUNTER — Encounter: Payer: Self-pay | Admitting: Family Medicine

## 2012-11-12 ENCOUNTER — Telehealth: Payer: Self-pay | Admitting: Family Medicine

## 2012-11-12 ENCOUNTER — Ambulatory Visit (INDEPENDENT_AMBULATORY_CARE_PROVIDER_SITE_OTHER): Payer: Medicare Other | Admitting: Family Medicine

## 2012-11-12 VITALS — BP 142/90 | HR 75 | Temp 97.8°F | Ht 63.75 in | Wt 118.1 lb

## 2012-11-12 DIAGNOSIS — M8580 Other specified disorders of bone density and structure, unspecified site: Secondary | ICD-10-CM

## 2012-11-12 DIAGNOSIS — E785 Hyperlipidemia, unspecified: Secondary | ICD-10-CM

## 2012-11-12 DIAGNOSIS — Z23 Encounter for immunization: Secondary | ICD-10-CM

## 2012-11-12 DIAGNOSIS — G4700 Insomnia, unspecified: Secondary | ICD-10-CM

## 2012-11-12 DIAGNOSIS — I10 Essential (primary) hypertension: Secondary | ICD-10-CM

## 2012-11-12 DIAGNOSIS — M899 Disorder of bone, unspecified: Secondary | ICD-10-CM

## 2012-11-12 DIAGNOSIS — N952 Postmenopausal atrophic vaginitis: Secondary | ICD-10-CM

## 2012-11-12 HISTORY — DX: Other specified disorders of bone density and structure, unspecified site: M85.80

## 2012-11-12 NOTE — Patient Instructions (Addendum)
Salon Pas cream or patches  Add a fiber supplement supplement such as Benefiber No blue or green lights in bedroom at  bedtime  Back Pain, Adult Low back pain is very common. About 1 in 5 people have back pain.The cause of low back pain is rarely dangerous. The pain often gets better over time.About half of people with a sudden onset of back pain feel better in just 2 weeks. About 8 in 10 people feel better by 6 weeks.  CAUSES Some common causes of back pain include:  Strain of the muscles or ligaments supporting the spine.  Wear and tear (degeneration) of the spinal discs.  Arthritis.  Direct injury to the back. DIAGNOSIS Most of the time, the direct cause of low back pain is not known.However, back pain can be treated effectively even when the exact cause of the pain is unknown.Answering your caregiver's questions about your overall health and symptoms is one of the most accurate ways to make sure the cause of your pain is not dangerous. If your caregiver needs more information, he or she may order lab work or imaging tests (X-rays or MRIs).However, even if imaging tests show changes in your back, this usually does not require surgery. HOME CARE INSTRUCTIONS For many people, back pain returns.Since low back pain is rarely dangerous, it is often a condition that people can learn to [facility] their own.   Remain active. It is stressful on the back to sit or stand in one place. Do not sit, drive, or stand in one place for more than 30 minutes at a time. Take short walks on level surfaces as soon as pain allows.Try to increase the length of time you walk each day.  Do not stay in bed.Resting more than 1 or 2 days can delay your recovery.  Do not avoid exercise or work.Your body is made to move.It is not dangerous to be active, even though your back may hurt.Your back will likely heal faster if you return to being active before your pain is gone.  Pay attention to your body when  you bend and lift. Many people have less discomfortwhen lifting if they bend their knees, keep the load close to their bodies,and avoid twisting. Often, the most comfortable positions are those that put less stress on your recovering back.  Find a comfortable position to sleep. Use a firm mattress and lie on your side with your knees slightly bent. If you lie on your back, put a pillow under your knees.  Only take over-the-counter or prescription medicines as directed by your caregiver. Over-the-counter medicines to reduce pain and inflammation are often the most helpful.Your caregiver may prescribe muscle relaxant drugs.These medicines help dull your pain so you can more quickly return to your normal activities and healthy exercise.  Put ice on the injured area.  Put ice in a plastic bag.  Place a towel between your skin and the bag.  Leave the ice on for 15-20 minutes, 3-4 times a day for the first 2 to 3 days. After that, ice and heat may be alternated to reduce pain and spasms.  Ask your caregiver about trying back exercises and gentle massage. This may be of some benefit.  Avoid feeling anxious or stressed.Stress increases muscle tension and can worsen back pain.It is important to recognize when you are anxious or stressed and learn ways to manage it.Exercise is a great option. SEEK MEDICAL CARE IF:  You have pain that is not relieved with rest or  medicine.  You have pain that does not improve in 1 week.  You have new symptoms.  You are generally not feeling well. SEEK IMMEDIATE MEDICAL CARE IF:   You have pain that radiates from your back into your legs.  You develop new bowel or bladder control problems.  You have unusual weakness or numbness in your arms or legs.  You develop nausea or vomiting.  You develop abdominal pain.  You feel faint. Document Released: 02/17/2005 Document Revised: 08/19/2011 Document Reviewed: 07/08/2010 [facility] Patient Information  2014 [place], [place].

## 2012-11-12 NOTE — Telephone Encounter (Signed)
Next visit annual with labs prior lipid, renal, hepatic, tsh, cbc, vitamin d level   Patient has appointment late October/2014 and will be going to [facility] lab

## 2012-11-14 ENCOUNTER — Encounter: Payer: Self-pay | Admitting: Family Medicine

## 2012-11-14 DIAGNOSIS — G4700 Insomnia, unspecified: Secondary | ICD-10-CM

## 2012-11-14 HISTORY — DX: Insomnia, unspecified: G47.00

## 2012-11-14 NOTE — Assessment & Plan Note (Signed)
Mild elevation with first visit here no changes

## 2012-11-14 NOTE — Progress Notes (Signed)
Patient ID: Christine Henderson, female   DOB: June 19, 1930, 79 y.o.   MRN: [id number] Christine Henderson [id number] March 08, 1930 11/14/2012      Progress Note-Follow Up  Subjective  Chief Complaint  Chief Complaint  Patient presents with  . Establish Care    transfer from Dr Tawanna Cooler    HPI  Patient is an 79 year old Caucasian female who is here today to transfer care. His office is closer to her home and she is recently been widowed. Her husband died of lymphoma 4 months ago. She reports she has good forward in her community and in her church and so she feels she's doing relatively well. She struggles with loose stool daily for the last one to 2 weeks but no bloody stool. She is struggling with a little insomnia but is responding to melatonin. She reports blood pressures at home are in the 120s over 60s. She has some intermittent trouble with low back which is ongoing no chest pain, palpitations, shortness of breath, GI or GU concerns otherwise noted today  Past Medical History  Diagnosis Date  . HYPERLIPIDEMIA 10/01/2006  . MIGRAINE HEADACHE 09/10/2006  . PHOTOPHOBIA 07/20/2009  . HYPERTENSION 10/06/2007  . ALLERGIC RHINITIS CAUSE UNSPECIFIED 02/02/2008  . Irritable bowel syndrome 09/10/2006  . VAGINITIS, ATROPHIC 10/01/2006  . SKIN TAG 11/09/2006  . OSTEOPENIA 09/10/2006  . Headache(784.0) 10/01/2006  . Palpitations 09/10/2006  . Diarrhea 06/26/2008  . Abdominal pain, unspecified site 06/26/2008  . Fibrocystic breast disease   . IBS (irritable bowel syndrome)   . MVP (mitral valve prolapse)   . Osteopenia 11/12/2012  . Insomnia 11/14/2012    Past Surgical History  Procedure Laterality Date  . Rotator cuff repair      right  . Tonsillectomy      Family History  Problem Relation Age of Onset  . Inflammatory bowel disease Sister   . Cancer Maternal Grandfather     pancratic  . Coronary artery disease Neg Hx   . Alzheimer's disease Mother   . Hyperlipidemia Son   . Obesity Son   .  Hyperlipidemia Son     History   Social History  . Marital Status: Married    Spouse Name: N/A    Number of Children: N/A  . Years of Education: N/A   Occupational History  . Not on file.   Social History Main Topics  . Smoking status: Never Smoker   . Smokeless tobacco: Not on file  . Alcohol Use: No  . Drug Use: No  . Sexual Activity: Yes    Birth Control/ Protection: None     Comment: regular exercise   Other Topics Concern  . Not on file   Social History Narrative  . No narrative on file    Current Outpatient Prescriptions on File Prior to Visit  Medication Sig Dispense Refill  . Ascorbic Acid (VITAMIN C) 1000 MG tablet Take 1,000 mg by mouth daily.        [name] Kitchen aspirin 81 MG tablet Take 81 mg by mouth daily.        [name] Kitchen atenolol (TENORMIN) 25 MG tablet Half tab daily  100 tablet  1  . Calcium Carbonate-Vitamin D (CALCIUM PLUS VITAMIN D PO) Take 1,200 tablets by mouth daily.       [name] Kitchen conjugated estrogens (PREMARIN) vaginal cream Place 0.5 g vaginally daily.      . fish oil-omega-3 fatty acids 1000 MG capsule Take 2 g by mouth daily.        [name] Kitchen  Glucosamine-Chondroit-Vit C-Mn (GLUCOSAMINE 1500 COMPLEX) CAPS Take 1,500 capsules by mouth 2 (two) times daily.        [name] Kitchen loperamide (IMODIUM) 2 MG capsule Take 2 mg by mouth 4 (four) times daily as needed. For diarrhea      . Multiple Vitamin (MULTIVITAMIN) tablet Take 1 tablet by mouth daily.        . simvastatin (ZOCOR) 10 MG tablet Take 1 tablet (10 mg total) by mouth at bedtime.  100 tablet  3   No current facility-administered medications on file prior to visit.    Allergies  Allergen Reactions  . Codeine     REACTION: Nausea  Vomiting    Review of Systems  Review of Systems  Constitutional: Negative for fever and malaise/fatigue.  HENT: Negative for congestion.   Eyes: Negative for discharge.  Respiratory: Negative for shortness of breath.   Cardiovascular: Negative for chest pain, palpitations and leg swelling.   Gastrointestinal: Positive for diarrhea. Negative for nausea and abdominal pain.  Genitourinary: Negative for dysuria.  Musculoskeletal: Negative for falls.  Skin: Negative for rash.  Neurological: Negative for loss of consciousness and headaches.  Endo/Heme/Allergies: Negative for polydipsia.  Psychiatric/Behavioral: Negative for depression and suicidal ideas. The patient is not nervous/anxious and does not have insomnia.     Objective  BP 142/90  Pulse 75  Temp(Src) 97.8 F (36.6 C) (Oral)  Ht 5' 3.75" (1.619 m)  Wt 118 lb 1.9 oz (53.579 kg)  BMI 20.44 kg/m2  SpO2 97%  Physical Exam  Physical Exam  Constitutional: She is oriented to person, place, and time and well-developed, well-nourished, and in no distress. No distress.  HENT:  Head: Normocephalic and atraumatic.  Eyes: Conjunctivae are normal.  Neck: Neck supple. No thyromegaly present.  Cardiovascular: Normal rate, regular rhythm and normal heart sounds.   No murmur heard. Pulmonary/Chest: Effort normal and breath sounds normal. She has no wheezes.  Abdominal: She exhibits no distension and no mass.  Musculoskeletal: She exhibits no edema.  Lymphadenopathy:    She has no cervical adenopathy.  Neurological: She is alert and oriented to person, place, and time.  Skin: Skin is warm and dry. No rash noted. She is not diaphoretic.  Psychiatric: Memory, affect and judgment normal.    Lab Results  Component Value Date   TSH 3.31 12/01/2011   Lab Results  Component Value Date   WBC 6.5 12/01/2011   HGB 13.9 12/01/2011   HCT 42.2 12/01/2011   MCV 98.2 12/01/2011   PLT 224.0 12/01/2011   Lab Results  Component Value Date   CREATININE 1.0 12/01/2011   BUN 25* 12/01/2011   NA 139 12/01/2011   K 4.1 12/01/2011   CL 104 12/01/2011   CO2 26 12/01/2011   Lab Results  Component Value Date   ALT 29 12/01/2011   AST 37 12/01/2011   ALKPHOS 39 12/01/2011   BILITOT 1.2 12/01/2011   Lab Results  Component Value Date   CHOL  165 12/01/2011   Lab Results  Component Value Date   HDL 67.00 12/01/2011   Lab Results  Component Value Date   LDLCALC 87 12/01/2011   Lab Results  Component Value Date   TRIG 54.0 12/01/2011   Lab Results  Component Value Date   CHOLHDL 2 12/01/2011     Assessment & Plan  HYPERTENSION Mild elevation with first visit here no changes  OSTEOPENIA Will check Vitamin D level with next visit.  VAGINITIS, ATROPHIC Uses Premarin cream just once  a week with good results  Insomnia Using Melatonin with good results.  HYPERLIPIDEMIA Tolerating Simvastatin, will check levels prior to next visit

## 2012-11-14 NOTE — Assessment & Plan Note (Signed)
Tolerating Simvastatin, will check levels prior to next visit

## 2012-11-14 NOTE — Assessment & Plan Note (Signed)
Will check Vitamin D level with next visit.

## 2012-11-14 NOTE — Assessment & Plan Note (Signed)
Uses Premarin cream just once a week with good results

## 2012-11-14 NOTE — Assessment & Plan Note (Signed)
Using Melatonin with good results.

## 2012-11-23 ENCOUNTER — Ambulatory Visit (INDEPENDENT_AMBULATORY_CARE_PROVIDER_SITE_OTHER)
Admission: RE | Admit: 2012-11-23 | Discharge: 2012-11-23 | Disposition: A | Discharge status: Home / Self Care | Payer: Medicare Other | Source: Ambulatory Visit | Attending: Family Medicine | Admitting: Family Medicine

## 2012-11-23 DIAGNOSIS — M8580 Other specified disorders of bone density and structure, unspecified site: Secondary | ICD-10-CM

## 2012-11-23 DIAGNOSIS — M899 Disorder of bone, unspecified: Secondary | ICD-10-CM

## 2012-12-06 NOTE — Progress Notes (Signed)
Quick Note:  Patient Informed and voiced understanding ______ 

## 2012-12-17 LAB — CBC
HCT: 40.8 % (ref 36.0–46.0)
MCHC: 34.3 g/dL (ref 30.0–36.0)
MCV: 95.6 fL (ref 78.0–100.0)
RDW: 14.5 % (ref 11.5–15.5)

## 2012-12-17 LAB — LIPID PANEL
HDL: 74 mg/dL (ref >39)
LDL Cholesterol: 77 mg/dL (ref 0–99)
Triglycerides: 74 mg/dL (ref <150)
VLDL: 15 mg/dL (ref 0–40)

## 2012-12-17 LAB — RENAL FUNCTION PANEL
Albumin: 4.3 g/dL (ref 3.5–5.2)
CO2: 29 mEq/L (ref 19–32)
Creat: 0.83 mg/dL (ref 0.50–1.10)

## 2012-12-17 LAB — HEPATIC FUNCTION PANEL
ALT: 19 U/L (ref 0–35)
AST: 29 U/L (ref 0–37)
Bilirubin, Direct: 0.2 mg/dL (ref 0.0–0.3)
Indirect Bilirubin: 1 mg/dL — ABNORMAL HIGH (ref 0.0–0.9)

## 2012-12-24 ENCOUNTER — Ambulatory Visit (INDEPENDENT_AMBULATORY_CARE_PROVIDER_SITE_OTHER): Payer: Medicare Other | Admitting: Family Medicine

## 2012-12-24 ENCOUNTER — Encounter: Payer: Self-pay | Admitting: Family Medicine

## 2012-12-24 VITALS — BP 142/76 | HR 69 | Temp 97.7°F | Ht 63.75 in | Wt 119.1 lb

## 2012-12-24 DIAGNOSIS — K589 Irritable bowel syndrome without diarrhea: Secondary | ICD-10-CM

## 2012-12-24 DIAGNOSIS — Z0000 Encounter for general adult medical examination without abnormal findings: Secondary | ICD-10-CM

## 2012-12-24 DIAGNOSIS — I10 Essential (primary) hypertension: Secondary | ICD-10-CM

## 2012-12-24 DIAGNOSIS — M899 Disorder of bone, unspecified: Secondary | ICD-10-CM

## 2012-12-24 DIAGNOSIS — E785 Hyperlipidemia, unspecified: Secondary | ICD-10-CM

## 2012-12-24 NOTE — Patient Instructions (Signed)
Back Pain, Adult  Low back pain is very common. About 1 in 5 people have back pain. The cause of low back pain is rarely dangerous. The pain often gets better over time. About half of people with a sudden onset of back pain feel better in just 2 weeks. About 8 in 10 people feel better by 6 weeks.   CAUSES  Some common causes of back pain include:  · Strain of the muscles or ligaments supporting the spine.  · Wear and tear (degeneration) of the spinal discs.  · Arthritis.  · Direct injury to the back.  DIAGNOSIS  Most of the time, the direct cause of low back pain is not known. However, back pain can be treated effectively even when the exact cause of the pain is unknown. Answering your caregiver's questions about your overall health and symptoms is one of the most accurate ways to make sure the cause of your pain is not dangerous. If your caregiver needs more information, he or she may order lab work or imaging tests (X-rays or MRIs). However, even if imaging tests show changes in your back, this usually does not require surgery.  HOME CARE INSTRUCTIONS  For many people, back pain returns. Since low back pain is rarely dangerous, it is often a condition that people can learn to manage on their own.   · Remain active. It is stressful on the back to sit or stand in one place. Do not sit, drive, or stand in one place for more than 30 minutes at a time. Take short walks on level surfaces as soon as pain allows. Try to increase the length of time you walk each day.  · Do not stay in bed. Resting more than 1 or 2 days can delay your recovery.  · Do not avoid exercise or work. Your body is made to move. It is not dangerous to be active, even though your back may hurt. Your back will likely heal faster if you return to being active before your pain is gone.  · Pay attention to your body when you  bend and lift. Many people have less discomfort when lifting if they bend their knees, keep the load close to their bodies, and  avoid twisting. Often, the most comfortable positions are those that put less stress on your recovering back.  · Find a comfortable position to sleep. Use a firm mattress and lie on your side with your knees slightly bent. If you lie on your back, put a pillow under your knees.  · Only take over-the-counter or prescription medicines as directed by your caregiver. Over-the-counter medicines to reduce pain and inflammation are often the most helpful. Your caregiver may prescribe muscle relaxant drugs. These medicines help dull your pain so you can more quickly return to your normal activities and healthy exercise.  · Put ice on the injured area.  · Put ice in a plastic bag.  · Place a towel between your skin and the bag.  · Leave the ice on for 15-20 minutes, 3-4 times a day for the first 2 to 3 days. After that, ice and heat may be alternated to reduce pain and spasms.  · Ask your caregiver about trying back exercises and gentle massage. This may be of some benefit.  · Avoid feeling anxious or stressed. Stress increases muscle tension and can worsen back pain. It is important to recognize when you are anxious or stressed and learn ways to manage it. Exercise is a great option.  SEEK MEDICAL CARE IF:  · You have pain that is not relieved with rest or   medicine.  · You have pain that does not improve in 1 week.  · You have new symptoms.  · You are generally not feeling well.  SEEK IMMEDIATE MEDICAL CARE IF:   · You have pain that radiates from your back into your legs.  · You develop new bowel or bladder control problems.  · You have unusual weakness or numbness in your arms or legs.  · You develop nausea or vomiting.  · You develop abdominal pain.  · You feel faint.  Document Released: 02/17/2005 Document Revised: 08/19/2011 Document Reviewed: 07/08/2010  [employer]® Patient Information ©2014 [employer].

## 2012-12-24 NOTE — Progress Notes (Signed)
Patient ID: Christine Henderson, female   DOB: 11/09/1930, 79 y.o.   MRN: [id number] Christine Henderson [id number] [birth year] 12/24/2012      Progress Note-Follow Up  Subjective  Chief Complaint  Chief Complaint  Patient presents with  . Annual Exam    physical    HPI  Patient is an 79 year old Caucasian female in today for routine medical care. She continues to struggle chronic low back pain but it is tolerable. She still manages to walk roughly 1/2 miles frequently. Her diarrhea has improved and she had a probiotics and fiber as recommended previously. 7 recent illness. She denies any hearing or vision changes. No headaches, chest pain, palpitations, shortness of breath, GI or GU concerns at this time.  Past Medical History  Diagnosis Date  . HYPERLIPIDEMIA 10/01/2006  . MIGRAINE HEADACHE 09/10/2006  . PHOTOPHOBIA 07/20/2009  . HYPERTENSION 10/06/2007  . ALLERGIC RHINITIS CAUSE UNSPECIFIED 02/02/2008  . Irritable bowel syndrome 09/10/2006  . VAGINITIS, ATROPHIC 10/01/2006  . SKIN TAG 11/09/2006  . OSTEOPENIA 09/10/2006  . Headache(784.0) 10/01/2006  . Palpitations 09/10/2006  . Diarrhea 06/26/2008  . Abdominal pain, unspecified site 06/26/2008  . Fibrocystic breast disease   . IBS (irritable bowel syndrome)   . MVP (mitral valve prolapse)   . Osteopenia 11/12/2012  . Insomnia 11/14/2012    Past Surgical History  Procedure Laterality Date  . Rotator cuff repair      right  . Tonsillectomy      Family History  Problem Relation Age of Onset  . Inflammatory bowel disease Sister   . Alzheimer's disease Sister   . Cancer Maternal Grandfather     pancratic  . Coronary artery disease Neg Hx   . Alzheimer's disease Mother   . Hyperlipidemia Son   . Obesity Son   . Hyperlipidemia Son     History   Social History  . Marital Status: Married    Spouse Name: N/A    Number of Children: N/A  . Years of Education: N/A   Occupational History  . Not on file.   Social History Main Topics   . Smoking status: Never Smoker   . Smokeless tobacco: Not on file  . Alcohol Use: No  . Drug Use: No  . Sexual Activity: Yes    Birth Control/ Protection: None     Comment: regular exercise   Other Topics Concern  . Not on file   Social History Narrative  . No narrative on file    Current Outpatient Prescriptions on File Prior to Visit  Medication Sig Dispense Refill  . Ascorbic Acid (VITAMIN C) 1000 MG tablet Take 1,000 mg by mouth daily.        [name] Kitchen aspirin 81 MG tablet Take 81 mg by mouth daily.        [name] Kitchen atenolol (TENORMIN) 25 MG tablet Half tab daily  100 tablet  1  . Calcium Carbonate-Vitamin D (CALCIUM PLUS VITAMIN D PO) Take 1,200 tablets by mouth daily.       [name] Kitchen conjugated estrogens (PREMARIN) vaginal cream Place 0.5 g vaginally daily.      . fish oil-omega-3 fatty acids 1000 MG capsule Take 2 g by mouth daily.        . Glucosamine-Chondroit-Vit C-Mn (GLUCOSAMINE 1500 COMPLEX) CAPS Take 1,500 capsules by mouth 2 (two) times daily.        [name] Kitchen loperamide (IMODIUM) 2 MG capsule Take 2 mg by mouth 4 (four) times daily as needed. For diarrhea      .  Multiple Vitamin (MULTIVITAMIN) tablet Take 1 tablet by mouth daily.        . simvastatin (ZOCOR) 10 MG tablet Take 1 tablet (10 mg total) by mouth at bedtime.  100 tablet  3   No current facility-administered medications on file prior to visit.    Allergies  Allergen Reactions  . Codeine     REACTION: Nausea  Vomiting    Review of Systems  Review of Systems  Constitutional: Negative for fever and malaise/fatigue.  HENT: Negative for congestion.   Eyes: Negative for discharge.  Respiratory: Negative for shortness of breath.   Cardiovascular: Negative for chest pain, palpitations and leg swelling.  Gastrointestinal: Negative for nausea, abdominal pain and diarrhea.  Genitourinary: Negative for dysuria.  Musculoskeletal: Negative for falls.  Skin: Negative for rash.  Neurological: Negative for loss of consciousness and  headaches.  Endo/Heme/Allergies: Negative for polydipsia.  Psychiatric/Behavioral: Negative for depression and suicidal ideas. The patient is not nervous/anxious and does not have insomnia.     Objective  BP 142/76  Pulse 69  Temp(Src) 97.7 F (36.5 C) (Oral)  Ht 5' 3.75" (1.619 m)  Wt 119 lb 1.3 oz (54.014 kg)  BMI 20.61 kg/m2  SpO2 98%  Physical Exam  Physical Exam  Constitutional: She is oriented to person, place, and time and well-developed, well-nourished, and in no distress. No distress.  HENT:  Head: Normocephalic and atraumatic.  Eyes: Conjunctivae are normal.  Neck: Neck supple. No thyromegaly present.  Cardiovascular: Normal rate, regular rhythm and normal heart sounds.   No murmur heard. Pulmonary/Chest: Effort normal and breath sounds normal. She has no wheezes.  Abdominal: She exhibits no distension and no mass.  Musculoskeletal: She exhibits no edema.  Lymphadenopathy:    She has no cervical adenopathy.  Neurological: She is alert and oriented to person, place, and time.  Skin: Skin is warm and dry. No rash noted. She is not diaphoretic.  Psychiatric: Memory, affect and judgment normal.    Lab Results  Component Value Date   TSH 4.033 12/17/2012   Lab Results  Component Value Date   WBC 6.5 12/17/2012   HGB 14.0 12/17/2012   HCT 40.8 12/17/2012   MCV 95.6 12/17/2012   PLT 243 12/17/2012   Lab Results  Component Value Date   CREATININE 0.83 12/17/2012   BUN 18 12/17/2012   NA 140 12/17/2012   K 4.2 12/17/2012   CL 105 12/17/2012   CO2 29 12/17/2012   Lab Results  Component Value Date   ALT 19 12/17/2012   AST 29 12/17/2012   ALKPHOS 40 12/17/2012   BILITOT 1.2 12/17/2012   Lab Results  Component Value Date   CHOL 166 12/17/2012   Lab Results  Component Value Date   HDL 74 12/17/2012   Lab Results  Component Value Date   LDLCALC 77 12/17/2012   Lab Results  Component Value Date   TRIG 74 12/17/2012   Lab Results  Component  Value Date   CHOLHDL 2.2 12/17/2012     Assessment & Plan  HYPERTENSION Well controlled no change  OSTEOPENIA Walking 1 1/2 miles regularly, continue 3 servings of ca/vit d daily, encouraged upper body exercise  HYPERLIPIDEMIA Good response to simvastatin, avoid trans fats  Annual physical exam Encouraged heart healthy diet, regular exercise, reviewed annual labs. Patient reports doing well at home, no depression or falls, has no advanced directives. Offers no concerns.   IRRITABLE BOWEL SYNDROME Loose stool improved with probiotics and fiber.

## 2012-12-26 ENCOUNTER — Encounter: Payer: Self-pay | Admitting: Family Medicine

## 2012-12-26 DIAGNOSIS — Z0000 Encounter for general adult medical examination without abnormal findings: Secondary | ICD-10-CM

## 2012-12-26 HISTORY — DX: Encounter for general adult medical examination without abnormal findings: Z00.00

## 2012-12-26 NOTE — Assessment & Plan Note (Signed)
Loose stool improved with probiotics and fiber.

## 2012-12-26 NOTE — Assessment & Plan Note (Signed)
Good response to simvastatin, avoid trans fats

## 2012-12-26 NOTE — Assessment & Plan Note (Signed)
Walking 1 1/2 miles regularly, continue 3 servings of ca/vit d daily, encouraged upper body exercise

## 2012-12-26 NOTE — Assessment & Plan Note (Signed)
Encouraged heart healthy diet, regular exercise, reviewed annual labs. Patient reports doing well at home, no depression or falls, has no advanced directives. Offers no concerns.

## 2012-12-26 NOTE — Assessment & Plan Note (Signed)
Well controlled no change 

## 2012-12-27 ENCOUNTER — Other Ambulatory Visit: Payer: Self-pay

## 2012-12-27 DIAGNOSIS — E785 Hyperlipidemia, unspecified: Secondary | ICD-10-CM

## 2012-12-27 DIAGNOSIS — I10 Essential (primary) hypertension: Secondary | ICD-10-CM

## 2012-12-27 MED ORDER — ATENOLOL 25 MG PO TABS: ORAL_TABLET | ORAL | Status: DC

## 2012-12-27 MED ORDER — SIMVASTATIN 10 MG PO TABS: 10.0000 mg | ORAL_TABLET | Freq: Every day | ORAL | Status: DC

## 2013-04-26 ENCOUNTER — Encounter: Payer: Self-pay | Admitting: Internal Medicine

## 2013-06-27 ENCOUNTER — Ambulatory Visit (HOSPITAL_BASED_OUTPATIENT_CLINIC_OR_DEPARTMENT_OTHER)
Admission: RE | Admit: 2013-06-27 | Discharge: 2013-06-27 | Disposition: A | Discharge status: Home / Self Care | Payer: Medicare Other | Source: Ambulatory Visit | Attending: Family Medicine | Admitting: Family Medicine

## 2013-06-27 ENCOUNTER — Encounter: Payer: Self-pay | Admitting: Family Medicine

## 2013-06-27 ENCOUNTER — Ambulatory Visit (INDEPENDENT_AMBULATORY_CARE_PROVIDER_SITE_OTHER): Payer: Medicare Other | Admitting: Family Medicine

## 2013-06-27 VITALS — BP 142/90 | HR 82 | Temp 98.6°F | Ht 63.75 in | Wt 124.0 lb

## 2013-06-27 DIAGNOSIS — M549 Dorsalgia, unspecified: Secondary | ICD-10-CM

## 2013-06-27 DIAGNOSIS — M545 Low back pain, unspecified: Secondary | ICD-10-CM | POA: Insufficient documentation

## 2013-06-27 DIAGNOSIS — Z23 Encounter for immunization: Secondary | ICD-10-CM

## 2013-06-27 DIAGNOSIS — M542 Cervicalgia: Secondary | ICD-10-CM | POA: Insufficient documentation

## 2013-06-27 DIAGNOSIS — E785 Hyperlipidemia, unspecified: Secondary | ICD-10-CM

## 2013-06-27 DIAGNOSIS — I10 Essential (primary) hypertension: Secondary | ICD-10-CM

## 2013-06-27 HISTORY — DX: Dorsalgia, unspecified: M54.9

## 2013-06-27 NOTE — Patient Instructions (Signed)
Tylenol 500 mg at bedtime and moist heat and gentle stretching as tolerated. May try NSAIDs and prescription meds as directed and report if symptoms worsen or seek immediate care Back Pain, Adult Low back pain is very common. About 1 in 5 people have back pain.The cause of low back pain is rarely dangerous. The pain often gets better over time.About half of people with a sudden onset of back pain feel better in just 2 weeks. About 8 in 10 people feel better by 6 weeks.  CAUSES Some common causes of back pain include:  Strain of the muscles or ligaments supporting the spine.  Wear and tear (degeneration) of the spinal discs.  Arthritis.  Direct injury to the back. DIAGNOSIS Most of the time, the direct cause of low back pain is not known.However, back pain can be treated effectively even when the exact cause of the pain is unknown.Answering your caregiver's questions about your overall health and symptoms is one of the most accurate ways to make sure the cause of your pain is not dangerous. If your caregiver needs more information, he or she may order lab work or imaging tests (X-rays or MRIs).However, even if imaging tests show changes in your back, this usually does not require surgery. HOME CARE INSTRUCTIONS For many people, back pain returns.Since low back pain is rarely dangerous, it is often a condition that people can learn to [facility] their own.   Remain active. It is stressful on the back to sit or stand in one place. Do not sit, drive, or stand in one place for more than 30 minutes at a time. Take short walks on level surfaces as soon as pain allows.Try to increase the length of time you walk each day.  Do not stay in bed.Resting more than 1 or 2 days can delay your recovery.  Do not avoid exercise or work.Your body is made to move.It is not dangerous to be active, even though your back may hurt.Your back will likely heal faster if you return to being active before your  pain is gone.  Pay attention to your body when you bend and lift. Many people have less discomfortwhen lifting if they bend their knees, keep the load close to their bodies,and avoid twisting. Often, the most comfortable positions are those that put less stress on your recovering back.  Find a comfortable position to sleep. Use a firm mattress and lie on your side with your knees slightly bent. If you lie on your back, put a pillow under your knees.  Only take over-the-counter or prescription medicines as directed by your caregiver. Over-the-counter medicines to reduce pain and inflammation are often the most helpful.Your caregiver may prescribe muscle relaxant drugs.These medicines help dull your pain so you can more quickly return to your normal activities and healthy exercise.  Put ice on the injured area.  Put ice in a plastic bag.  Place a towel between your skin and the bag.  Leave the ice on for 15-20 minutes, 03-04 times a day for the first 2 to 3 days. After that, ice and heat may be alternated to reduce pain and spasms.  Ask your caregiver about trying back exercises and gentle massage. This may be of some benefit.  Avoid feeling anxious or stressed.Stress increases muscle tension and can worsen back pain.It is important to recognize when you are anxious or stressed and learn ways to manage it.Exercise is a great option. SEEK MEDICAL CARE IF:  You have pain that is  not relieved with rest or medicine.  You have pain that does not improve in 1 week.  You have new symptoms.  You are generally not feeling well. SEEK IMMEDIATE MEDICAL CARE IF:   You have pain that radiates from your back into your legs.  You develop new bowel or bladder control problems.  You have unusual weakness or numbness in your arms or legs.  You develop nausea or vomiting.  You develop abdominal pain.  You feel faint. Document Released: 02/17/2005 Document Revised: 08/19/2011 Document  Reviewed: 07/08/2010 [facility] Patient Information 2014 [place], [place].

## 2013-06-27 NOTE — Progress Notes (Signed)
Patient ID: Christine Henderson, female   DOB: [birth year], 80 y.o.   MRN: [id number] Christine Henderson [id number] Sep 17, 1930 06/27/2013      Progress Note-Follow Up  Subjective  Chief Complaint  Chief Complaint  Patient presents with  . Follow-up    6 month    HPI  Patient is a 80 year old female in today for routine medical care. Her biggest c/o is of worsening back pain both cerfvical and low back, she has pain of 2-3 of 10 on a good day and 5 of 10 on a bad day. She does note her appetite is better. She continues to have some diarrhea but it is greatly improved with fiber and probiotics. She notes her back pain is improved some with walking and Aspercreme. Denies CP/palp/SOB/HA/congestion/fevers or GU c/o. Taking meds as prescribed  Past Medical History  Diagnosis Date  . HYPERLIPIDEMIA 10/01/2006  . MIGRAINE HEADACHE 09/10/2006  . PHOTOPHOBIA 07/20/2009  . HYPERTENSION 10/06/2007  . ALLERGIC RHINITIS CAUSE UNSPECIFIED 02/02/2008  . Irritable bowel syndrome 09/10/2006  . VAGINITIS, ATROPHIC 10/01/2006  . SKIN TAG 11/09/2006  . OSTEOPENIA 09/10/2006  . Headache(784.0) 10/01/2006  . Palpitations 09/10/2006  . Diarrhea 06/26/2008  . Abdominal pain, unspecified site 06/26/2008  . Fibrocystic breast disease   . IBS (irritable bowel syndrome)   . MVP (mitral valve prolapse)   . Osteopenia 11/12/2012  . Insomnia 11/14/2012  . Annual physical exam 12/26/2012    Past Surgical History  Procedure Laterality Date  . Rotator cuff repair      right  . Tonsillectomy      Family History  Problem Relation Age of Onset  . Inflammatory bowel disease Sister   . Alzheimer's disease Sister   . Cancer Maternal Grandfather     pancratic  . Coronary artery disease Neg Hx   . Alzheimer's disease Mother   . Hyperlipidemia Son   . Obesity Son   . Hyperlipidemia Son     History   Social History  . Marital Status: Married    Spouse Name: N/A    Number of Children: N/A  . Years of Education: N/A    Occupational History  . Not on file.   Social History Main Topics  . Smoking status: Never Smoker   . Smokeless tobacco: Not on file  . Alcohol Use: No  . Drug Use: No  . Sexual Activity: Yes    Birth Control/ Protection: None     Comment: regular exercise   Other Topics Concern  . Not on file   Social History Narrative  . No narrative on file    Current Outpatient Prescriptions on File Prior to Visit  Medication Sig Dispense Refill  . Ascorbic Acid (VITAMIN C) 1000 MG tablet Take 1,000 mg by mouth daily.        [name] Kitchen aspirin 81 MG tablet Take 81 mg by mouth daily.        [name] Kitchen atenolol (TENORMIN) 25 MG tablet Half tab daily  100 tablet  2  . Calcium Carbonate-Vitamin D (CALCIUM PLUS VITAMIN D PO) Take 1,200 tablets by mouth daily.       [name] Kitchen conjugated estrogens (PREMARIN) vaginal cream Place 0.5 g vaginally daily.      . fish oil-omega-3 fatty acids 1000 MG capsule Take 2 g by mouth daily.        . Glucosamine-Chondroit-Vit C-Mn (GLUCOSAMINE 1500 COMPLEX) CAPS Take 1,500 capsules by mouth 2 (two) times daily.        [name] Kitchen  loperamide (IMODIUM) 2 MG capsule Take 2 mg by mouth 4 (four) times daily as needed. For diarrhea      . Multiple Vitamin (MULTIVITAMIN) tablet Take 1 tablet by mouth daily.        . simvastatin (ZOCOR) 10 MG tablet Take 1 tablet (10 mg total) by mouth at bedtime.  100 tablet  3   No current facility-administered medications on file prior to visit.    Allergies  Allergen Reactions  . Codeine     REACTION: Nausea  Vomiting    Review of Systems  Review of Systems  Constitutional: Negative for fever and malaise/fatigue.  HENT: Negative for congestion.   Eyes: Negative for discharge.  Respiratory: Negative for shortness of breath.   Cardiovascular: Negative for chest pain, palpitations and leg swelling.  Gastrointestinal: Negative for nausea, abdominal pain and diarrhea.  Genitourinary: Negative for dysuria.  Musculoskeletal: Negative for falls.  Skin:  Negative for rash.  Neurological: Negative for loss of consciousness and headaches.  Endo/Heme/Allergies: Negative for polydipsia.  Psychiatric/Behavioral: Negative for depression and suicidal ideas. The patient is not nervous/anxious and does not have insomnia.     Objective  BP 142/90  Pulse 82  Temp(Src) 98.6 F (37 C) (Oral)  Ht 5' 3.75" (1.619 m)  Wt 124 lb (56.246 kg)  BMI 21.46 kg/m2  SpO2 96%  Physical Exam  Physical Exam  Constitutional: She is oriented to person, place, and time and well-developed, well-nourished, and in no distress. No distress.  HENT:  Head: Normocephalic and atraumatic.  Eyes: Conjunctivae are normal.  Neck: Neck supple. No thyromegaly present.  Cardiovascular: Normal rate, regular rhythm and normal heart sounds.   No murmur heard. Pulmonary/Chest: Effort normal and breath sounds normal. She has no wheezes.  Abdominal: She exhibits no distension and no mass.  Musculoskeletal: She exhibits no edema.  Lymphadenopathy:    She has no cervical adenopathy.  Neurological: She is alert and oriented to person, place, and time.  Skin: Skin is warm and dry. No rash noted. She is not diaphoretic.  Psychiatric: Memory, affect and judgment normal.    Lab Results  Component Value Date   TSH 4.033 12/17/2012   Lab Results  Component Value Date   WBC 6.5 12/17/2012   HGB 14.0 12/17/2012   HCT 40.8 12/17/2012   MCV 95.6 12/17/2012   PLT 243 12/17/2012   Lab Results  Component Value Date   CREATININE 0.83 12/17/2012   BUN 18 12/17/2012   NA 140 12/17/2012   K 4.2 12/17/2012   CL 105 12/17/2012   CO2 29 12/17/2012   Lab Results  Component Value Date   ALT 19 12/17/2012   AST 29 12/17/2012   ALKPHOS 40 12/17/2012   BILITOT 1.2 12/17/2012   Lab Results  Component Value Date   CHOL 166 12/17/2012   Lab Results  Component Value Date   HDL 74 12/17/2012   Lab Results  Component Value Date   LDLCALC 77 12/17/2012   Lab Results   Component Value Date   TRIG 74 12/17/2012   Lab Results  Component Value Date   CHOLHDL 2.2 12/17/2012     Assessment & Plan HYPERTENSION Well controlled, no changes to meds. Encouraged heart healthy diet such as the DASH diet and exercise as tolerated.   HYPERLIPIDEMIA Encouraged heart healthy diet, increase exercise, avoid trans fats, consider a krill oil cap daily. Tolerating statin  Back pain Encouraged Tylenol and topical treatments. Encouraged moist heat and gentle stretching as  tolerated. May try meds as directed and report if symptoms worsen or seek immediate care. xrays confirm significant degenerative disease consider referral to neurosurgery.

## 2013-06-27 NOTE — Progress Notes (Signed)
Pre visit review using our clinic review tool, if applicable. No additional management support is needed unless otherwise documented below in the visit note. 

## 2013-06-28 ENCOUNTER — Telehealth: Payer: Self-pay | Admitting: Family Medicine

## 2013-06-28 LAB — RENAL FUNCTION PANEL
ALBUMIN: 4.5 g/dL (ref 3.5–5.2)
BUN: 16 mg/dL (ref 6–23)
CO2: 27 mEq/L (ref 19–32)
CREATININE: 0.81 mg/dL (ref 0.50–1.10)
Calcium: 9.8 mg/dL (ref 8.4–10.5)
Chloride: 104 mEq/L (ref 96–112)
Glucose, Bld: 91 mg/dL (ref 70–99)
PHOSPHORUS: 3.3 mg/dL (ref 2.3–4.6)
Potassium: 4.5 mEq/L (ref 3.5–5.3)
Sodium: 144 mEq/L (ref 135–145)

## 2013-06-28 LAB — HEPATIC FUNCTION PANEL
ALT: 22 U/L (ref 0–35)
AST: 32 U/L (ref 0–37)
Albumin: 4.5 g/dL (ref 3.5–5.2)
Alkaline Phosphatase: 49 U/L (ref 39–117)
Bilirubin, Direct: 0.2 mg/dL (ref 0.0–0.3)
Indirect Bilirubin: 1 mg/dL (ref 0.2–1.2)
Total Bilirubin: 1.2 mg/dL (ref 0.2–1.2)
Total Protein: 7.1 g/dL (ref 6.0–8.3)

## 2013-06-28 LAB — CBC
HEMATOCRIT: 42 % (ref 36.0–46.0)
Hemoglobin: 13.9 g/dL (ref 12.0–15.0)
MCH: 31.6 pg (ref 26.0–34.0)
MCHC: 33.1 g/dL (ref 30.0–36.0)
MCV: 95.5 fL (ref 78.0–100.0)
Platelets: 271 10*3/uL (ref 150–400)
RBC: 4.4 MIL/uL (ref 3.87–5.11)
RDW: 14 % (ref 11.5–15.5)
WBC: 7 10*3/uL (ref 4.0–10.5)

## 2013-06-28 LAB — LIPID PANEL
CHOLESTEROL: 189 mg/dL (ref 0–200)
HDL: 73 mg/dL (ref >39)
LDL CALC: 92 mg/dL (ref 0–99)
TRIGLYCERIDES: 122 mg/dL (ref <150)
Total CHOL/HDL Ratio: 2.6 Ratio
VLDL: 24 mg/dL (ref 0–40)

## 2013-06-28 LAB — TSH: TSH: 3.275 u[IU]/mL (ref 0.350–4.500)

## 2013-06-28 NOTE — Telephone Encounter (Signed)
Relevant patient education mailed to patient.  

## 2013-07-03 NOTE — Assessment & Plan Note (Signed)
Well controlled, no changes to meds. Encouraged heart healthy diet such as the DASH diet and exercise as tolerated.  °

## 2013-07-03 NOTE — Assessment & Plan Note (Signed)
Encouraged heart healthy diet, increase exercise, avoid trans fats, consider a krill oil cap daily. Tolerating statin 

## 2013-07-03 NOTE — Assessment & Plan Note (Addendum)
Encouraged Tylenol and topical treatments. Encouraged moist heat and gentle stretching as tolerated. May try meds as directed and report if symptoms worsen or seek immediate care. xrays confirm significant degenerative disease consider referral to neurosurgery.

## 2013-07-04 ENCOUNTER — Telehealth: Payer: Self-pay

## 2013-07-04 NOTE — Telephone Encounter (Signed)
FYI: Pt states that her pain is about the same but she doesn't want to do anything at this time. Pt will let us know if she changes her mind.

## 2013-07-04 NOTE — Telephone Encounter (Signed)
Message copied by Varney Daily on Mon Jul 04, 2013  7:42 AM ------      Message from: Penni Homans A      Created: Sun Jul 03, 2013  3:41 PM       See how her pain is and see if she is willing to be referred to neurosurgery due to her back pain her xrays confirmed significant degeneration ------

## 2013-10-24 ENCOUNTER — Encounter: Payer: Self-pay | Admitting: Internal Medicine

## 2013-10-31 ENCOUNTER — Encounter: Payer: Self-pay | Admitting: Family Medicine

## 2013-11-11 ENCOUNTER — Encounter: Payer: Self-pay | Admitting: Physician Assistant

## 2013-11-11 ENCOUNTER — Ambulatory Visit (INDEPENDENT_AMBULATORY_CARE_PROVIDER_SITE_OTHER): Payer: Medicare Other | Admitting: Physician Assistant

## 2013-11-11 VITALS — BP 140/95 | HR 68 | Temp 98.0°F | Wt 122.8 lb

## 2013-11-11 DIAGNOSIS — IMO0002 Reserved for concepts with insufficient information to code with codable children: Secondary | ICD-10-CM

## 2013-11-11 DIAGNOSIS — M47812 Spondylosis without myelopathy or radiculopathy, cervical region: Secondary | ICD-10-CM

## 2013-11-11 DIAGNOSIS — M5416 Radiculopathy, lumbar region: Secondary | ICD-10-CM

## 2013-11-11 MED ORDER — METHYLPREDNISOLONE 4 MG PO KIT: PACK | ORAL | Status: DC

## 2013-11-11 NOTE — Progress Notes (Signed)
Pre visit review using our clinic review tool, if applicable. No additional management support is needed unless otherwise documented below in the visit note. 

## 2013-11-11 NOTE — Patient Instructions (Signed)
Please take steroid pack as directed.  Tylenol for pain.  Continue topical Aspercreme.  Avoid heavy lifting and over exertion.  A heating pad may help relieve some pain and muscle tension.  I have placed a referral to Neurosurgery for you giving chronic symptoms and abnormal findings on x-ray.  You will be contacted for an appointment.  Follow-up if symptoms are not improving.

## 2013-11-13 DIAGNOSIS — M5416 Radiculopathy, lumbar region: Secondary | ICD-10-CM

## 2013-11-13 HISTORY — DX: Radiculopathy, lumbar region: M54.16

## 2013-11-13 NOTE — Assessment & Plan Note (Signed)
Rx Medrol dose pack.  Tylenol for pain.  Topical Aspercreme. Heating pad.  Avoid heavy lifting or overexertion.  Patient referred to Neurosurgery for chronic neck pain with DJD.  This will also help address persistent LBP symptoms if not improving with conservative measures.  Follow-up with PCP or myself in 1 week if no improvement.

## 2013-11-13 NOTE — Progress Notes (Signed)
Patient presents to clinic today c/o low back pain x 3 weeks.  First occurred after spending the day digging holes in her garden.  Patient endorses pain is throbbing in nature.  Endorses pain is worse when standing from a seated position. Endorses radiation of pain into LLE. Denies numbness, tingling or weakness of lower extremities.  Denies saddle anesthesia or change to bowel or bladder habits.  Has not taken anything for symptoms.  Patient also endorses chronic cervical neck pain with evidence of disc disease on imaging.  Was told previously by her PCP that she should be evaluated by Neurosurgery.  Patient declined at that time but wishes to proceed with referral at present.  Past Medical History  Diagnosis Date  . HYPERLIPIDEMIA 10/01/2006  . MIGRAINE HEADACHE 09/10/2006  . PHOTOPHOBIA 07/20/2009  . HYPERTENSION 10/06/2007  . ALLERGIC RHINITIS CAUSE UNSPECIFIED 02/02/2008  . Irritable bowel syndrome 09/10/2006  . VAGINITIS, ATROPHIC 10/01/2006  . SKIN TAG 11/09/2006  . OSTEOPENIA 09/10/2006  . Headache(784.0) 10/01/2006  . Palpitations 09/10/2006  . Diarrhea 06/26/2008  . Abdominal pain, unspecified site 06/26/2008  . Fibrocystic breast disease   . IBS (irritable bowel syndrome)   . MVP (mitral valve prolapse)   . Osteopenia 11/12/2012  . Insomnia 11/14/2012  . Annual physical exam 12/26/2012    Current Outpatient Prescriptions on File Prior to Visit  Medication Sig Dispense Refill  . Ascorbic Acid (VITAMIN C) 1000 MG tablet Take 1,000 mg by mouth daily.        [name] Kitchen aspirin 81 MG tablet Take 81 mg by mouth daily.        [name] Kitchen atenolol (TENORMIN) 25 MG tablet Half tab daily  100 tablet  2  . Calcium Carbonate-Vitamin D (CALCIUM PLUS VITAMIN D PO) Take 1,200 tablets by mouth daily.       [name] Kitchen conjugated estrogens (PREMARIN) vaginal cream Place 0.5 g vaginally daily.      . fish oil-omega-3 fatty acids 1000 MG capsule Take 2 g by mouth daily.        . Glucosamine-Chondroit-Vit C-Mn (GLUCOSAMINE 1500  COMPLEX) CAPS Take 1,500 capsules by mouth 2 (two) times daily.        [name] Kitchen loperamide (IMODIUM) 2 MG capsule Take 2 mg by mouth 4 (four) times daily as needed. For diarrhea      . Multiple Vitamin (MULTIVITAMIN) tablet Take 1 tablet by mouth daily.        . simvastatin (ZOCOR) 10 MG tablet Take 1 tablet (10 mg total) by mouth at bedtime.  100 tablet  3   No current facility-administered medications on file prior to visit.    Allergies  Allergen Reactions  . Codeine     REACTION: Nausea  Vomiting    Family History  Problem Relation Age of Onset  . Inflammatory bowel disease Sister   . Alzheimer's disease Sister   . Cancer Maternal Grandfather     pancratic  . Coronary artery disease Neg Hx   . Alzheimer's disease Mother   . Hyperlipidemia Son   . Obesity Son   . Hyperlipidemia Son     History   Social History  . Marital Status: Widowed    Spouse Name: N/A    Number of Children: N/A  . Years of Education: N/A   Social History Main Topics  . Smoking status: Never Smoker   . Smokeless tobacco: None  . Alcohol Use: No  . Drug Use: No  . Sexual Activity: Yes    Birth Control/  Protection: None     Comment: regular exercise   Other Topics Concern  . None   Social History Narrative  . None   Review of Systems - See HPI.  All other ROS are negative.  BP 140/95  Pulse 68  Temp(Src) 98 F (36.7 C)  Wt 122 lb 12.8 oz (55.702 kg)  SpO2 97%  Physical Exam  Vitals reviewed. Constitutional: She is oriented to person, place, and time and well-developed, well-nourished, and in no distress.  HENT:  Head: Normocephalic and atraumatic.  Cardiovascular: Normal rate, regular rhythm, normal heart sounds and intact distal pulses.   Pulmonary/Chest: Effort normal and breath sounds normal.  Abdominal: Soft. Bowel sounds are normal. She exhibits no distension and no mass. There is no tenderness. There is no rebound and no guarding.  Musculoskeletal:       Cervical back: She  exhibits tenderness. She exhibits normal range of motion and no bony tenderness.       Lumbar back: She exhibits tenderness and pain. She exhibits normal range of motion and no bony tenderness.  Neurological: She is alert and oriented to person, place, and time.  Skin: Skin is warm and dry. No rash noted.  Psychiatric: Affect normal.    No results found for this or any previous visit (from the past 2160 hour(s)).  Assessment/Plan: Lumbar back pain with radiculopathy affecting left lower extremity Rx Medrol dose pack.  Tylenol for pain.  Topical Aspercreme. Heating pad.  Avoid heavy lifting or overexertion.  Patient referred to Neurosurgery for chronic neck pain with DJD.  This will also help address persistent LBP symptoms if not improving with conservative measures.  Follow-up with PCP or myself in 1 week if no improvement.

## 2013-11-21 ENCOUNTER — Ambulatory Visit: Payer: Medicare Other | Attending: Neurosurgery | Admitting: Physical Therapy

## 2013-11-21 DIAGNOSIS — M545 Low back pain, unspecified: Secondary | ICD-10-CM | POA: Insufficient documentation

## 2013-11-21 DIAGNOSIS — R293 Abnormal posture: Secondary | ICD-10-CM | POA: Insufficient documentation

## 2013-11-21 DIAGNOSIS — IMO0001 Reserved for inherently not codable concepts without codable children: Secondary | ICD-10-CM | POA: Insufficient documentation

## 2013-11-23 ENCOUNTER — Ambulatory Visit: Payer: Medicare Other | Admitting: Physical Therapy

## 2013-11-23 DIAGNOSIS — IMO0001 Reserved for inherently not codable concepts without codable children: Secondary | ICD-10-CM | POA: Diagnosis not present

## 2013-11-29 ENCOUNTER — Ambulatory Visit: Payer: Medicare Other | Admitting: Physical Therapy

## 2013-11-29 DIAGNOSIS — IMO0001 Reserved for inherently not codable concepts without codable children: Secondary | ICD-10-CM | POA: Diagnosis not present

## 2013-12-01 ENCOUNTER — Ambulatory Visit: Payer: Medicare Other | Attending: Neurosurgery | Admitting: Rehabilitation

## 2013-12-01 DIAGNOSIS — R293 Abnormal posture: Secondary | ICD-10-CM | POA: Diagnosis not present

## 2013-12-01 DIAGNOSIS — M545 Low back pain: Secondary | ICD-10-CM | POA: Insufficient documentation

## 2013-12-01 DIAGNOSIS — Z5189 Encounter for other specified aftercare: Secondary | ICD-10-CM | POA: Diagnosis present

## 2013-12-05 ENCOUNTER — Ambulatory Visit: Payer: Medicare Other | Admitting: Rehabilitation

## 2013-12-05 DIAGNOSIS — Z5189 Encounter for other specified aftercare: Secondary | ICD-10-CM | POA: Diagnosis not present

## 2013-12-08 ENCOUNTER — Ambulatory Visit: Payer: Medicare Other | Admitting: Rehabilitation

## 2013-12-08 DIAGNOSIS — Z5189 Encounter for other specified aftercare: Secondary | ICD-10-CM | POA: Diagnosis not present

## 2013-12-12 ENCOUNTER — Ambulatory Visit: Payer: Medicare Other | Admitting: Physical Therapy

## 2013-12-15 ENCOUNTER — Ambulatory Visit: Payer: Medicare Other | Admitting: Rehabilitation

## 2013-12-15 DIAGNOSIS — Z5189 Encounter for other specified aftercare: Secondary | ICD-10-CM | POA: Diagnosis not present

## 2013-12-19 ENCOUNTER — Ambulatory Visit: Payer: Medicare Other | Admitting: Rehabilitation

## 2013-12-19 DIAGNOSIS — Z5189 Encounter for other specified aftercare: Secondary | ICD-10-CM | POA: Diagnosis not present

## 2013-12-20 ENCOUNTER — Encounter: Payer: Self-pay | Admitting: Internal Medicine

## 2013-12-20 ENCOUNTER — Ambulatory Visit (INDEPENDENT_AMBULATORY_CARE_PROVIDER_SITE_OTHER): Payer: Medicare Other | Admitting: Internal Medicine

## 2013-12-20 VITALS — BP 150/84 | HR 80 | Ht 62.75 in | Wt 122.1 lb

## 2013-12-20 DIAGNOSIS — K589 Irritable bowel syndrome without diarrhea: Secondary | ICD-10-CM

## 2013-12-20 NOTE — Patient Instructions (Addendum)
Please continue your Benefiber and probiotics.  Dr Randel Pigg

## 2013-12-20 NOTE — Progress Notes (Signed)
Christine Henderson 03-05-1930 [id number]  Note: This dictation was prepared with Dragon digital system. Any transcriptional errors that result from this procedure are unintentional.   History of Present Illness:  This is an 80 year old white female who had her last screening colonoscopy in April 2005. It  was a normal exam. She had a vagal bradycardia  during the exam. Her husband died one year ago and she went through a period of having diarrhea. She is now doing well on Benefiber 2 teaspoons twice a day and probiotic daily. She denies rectal bleeding or abdominal pain. There is no family history of colon cancer. Patient is not sure about having a recall colonoscopy.at her age.    Past Medical History  Diagnosis Date  . HYPERLIPIDEMIA 10/01/2006  . MIGRAINE HEADACHE 09/10/2006  . PHOTOPHOBIA 07/20/2009  . HYPERTENSION 10/06/2007  . ALLERGIC RHINITIS CAUSE UNSPECIFIED 02/02/2008  . Irritable bowel syndrome 09/10/2006  . VAGINITIS, ATROPHIC 10/01/2006  . SKIN TAG 11/09/2006  . OSTEOPENIA 09/10/2006  . Headache(784.0) 10/01/2006  . Palpitations 09/10/2006  . Diarrhea 06/26/2008  . Abdominal pain, unspecified site 06/26/2008  . Fibrocystic breast disease   . IBS (irritable bowel syndrome)   . MVP (mitral valve prolapse)   . Osteopenia 11/12/2012  . Insomnia 11/14/2012  . Annual physical exam 12/26/2012    Past Surgical History  Procedure Laterality Date  . Rotator cuff repair Right   . Tonsillectomy      Allergies  Allergen Reactions  . Codeine     REACTION: Nausea  Vomiting    Family history and social history have been reviewed.  Review of Systems: History of intermittent diarrhea attributed to irritable bowel syndrome  The remainder of the 10 point ROS is negative except as outlined in the H&P  Physical Exam: General Appearance thin, in no distress Eyes  Non icteric  HEENT  Non traumatic, normocephalic  Mouth No lesion, tongue papillated, no cheilosis Neck Supple without  adenopathy, thyroid not enlarged, no carotid bruits, no JVD Lungs Clear to auscultation bilaterally COR Normal S1, normal S2, regular rhythm, no murmur, quiet precordium Abdomen soft nontender with normal active bowel sounds. No tympany or distention Rectal soft brown Hemoccult negative stool Extremities  No pedal edema Skin No lesions Neurological Alert and oriented x 3 Psychological Normal mood and affect  Assessment and Plan:   Problem #66 80 year old white female who is due for a screening colonoscopy. She is 80 years old and does not desire to continue with neoplastic screening. She is Hemoccult negative on my exam and has regular bowel habits on a high fiber diet, Benefiber and probiotic. We will take her out of the recall system. We will be happy to see her in the future.    Delfin Edis 12/20/2013

## 2013-12-22 ENCOUNTER — Ambulatory Visit: Payer: Medicare Other | Admitting: Rehabilitation

## 2013-12-22 DIAGNOSIS — Z5189 Encounter for other specified aftercare: Secondary | ICD-10-CM | POA: Diagnosis not present

## 2013-12-26 ENCOUNTER — Ambulatory Visit: Payer: Medicare Other | Admitting: Physical Therapy

## 2013-12-26 DIAGNOSIS — Z5189 Encounter for other specified aftercare: Secondary | ICD-10-CM | POA: Diagnosis not present

## 2013-12-29 ENCOUNTER — Ambulatory Visit: Payer: Medicare Other | Admitting: Physical Therapy

## 2013-12-30 ENCOUNTER — Ambulatory Visit (INDEPENDENT_AMBULATORY_CARE_PROVIDER_SITE_OTHER): Payer: Medicare Other | Admitting: Family Medicine

## 2013-12-30 ENCOUNTER — Encounter: Payer: Self-pay | Admitting: Family Medicine

## 2013-12-30 VITALS — BP 146/71 | HR 60 | Temp 97.6°F | Ht 63.75 in | Wt 124.0 lb

## 2013-12-30 DIAGNOSIS — I10 Essential (primary) hypertension: Secondary | ICD-10-CM

## 2013-12-30 DIAGNOSIS — E785 Hyperlipidemia, unspecified: Secondary | ICD-10-CM

## 2013-12-30 DIAGNOSIS — Z0000 Encounter for general adult medical examination without abnormal findings: Secondary | ICD-10-CM

## 2013-12-30 DIAGNOSIS — Z23 Encounter for immunization: Secondary | ICD-10-CM

## 2013-12-30 DIAGNOSIS — M5416 Radiculopathy, lumbar region: Secondary | ICD-10-CM

## 2013-12-30 LAB — RENAL FUNCTION PANEL
ALBUMIN: 3.9 g/dL (ref 3.5–5.2)
BUN: 23 mg/dL (ref 6–23)
CHLORIDE: 102 meq/L (ref 96–112)
CO2: 29 meq/L (ref 19–32)
Calcium: 9.7 mg/dL (ref 8.4–10.5)
Creatinine, Ser: 1 mg/dL (ref 0.4–1.2)
GFR: 57.78 mL/min — ABNORMAL LOW (ref >60.00)
Glucose, Bld: 114 mg/dL — ABNORMAL HIGH (ref 70–99)
PHOSPHORUS: 3.6 mg/dL (ref 2.3–4.6)
POTASSIUM: 3.9 meq/L (ref 3.5–5.1)
Sodium: 138 mEq/L (ref 135–145)

## 2013-12-30 LAB — CBC
HCT: 45.1 % (ref 36.0–46.0)
HEMOGLOBIN: 14.8 g/dL (ref 12.0–15.0)
MCHC: 32.7 g/dL (ref 30.0–36.0)
MCV: 98.2 fl (ref 78.0–100.0)
Platelets: 258 10*3/uL (ref 150.0–400.0)
RBC: 4.6 Mil/uL (ref 3.87–5.11)
RDW: 14.8 % (ref 11.5–15.5)
WBC: 8 10*3/uL (ref 4.0–10.5)

## 2013-12-30 LAB — LIPID PANEL
CHOL/HDL RATIO: 3
Cholesterol: 191 mg/dL (ref 0–200)
HDL: 76.2 mg/dL (ref >39.00)
LDL Cholesterol: 94 mg/dL (ref 0–99)
NONHDL: 114.8
Triglycerides: 106 mg/dL (ref 0.0–149.0)
VLDL: 21.2 mg/dL (ref 0.0–40.0)

## 2013-12-30 LAB — HEPATIC FUNCTION PANEL
ALBUMIN: 3.9 g/dL (ref 3.5–5.2)
ALK PHOS: 45 U/L (ref 39–117)
ALT: 25 U/L (ref 0–35)
AST: 31 U/L (ref 0–37)
BILIRUBIN DIRECT: 0.1 mg/dL (ref 0.0–0.3)
Total Bilirubin: 1.6 mg/dL — ABNORMAL HIGH (ref 0.2–1.2)
Total Protein: 7.9 g/dL (ref 6.0–8.3)

## 2013-12-30 LAB — TSH: TSH: 1.69 u[IU]/mL (ref 0.35–4.50)

## 2013-12-30 MED ORDER — LOSARTAN POTASSIUM 25 MG PO TABS: 25.0000 mg | ORAL_TABLET | Freq: Every day | ORAL | Status: DC

## 2013-12-30 NOTE — Progress Notes (Signed)
Patient ID: Christine Henderson, female   DOB: 1930/11/20, 80 y.o.   MRN: [id number] Christine Henderson [id number] 1931/01/22 12/30/2013      Progress Note-Follow Up  Subjective  Chief Complaint  Chief Complaint  Patient presents with  . Annual Exam    medicare wellness  . Injections    flu    HPI  Patient is a 80 year old female in today for routine medical care.  She is in today for annual exam. Is doing fairly well. Her left hip pain is greatly improved after a course of physical therapy. No falls or radiculopathy. The pain started after working in her yard. Steroids were not helpful, no incontinence, no recent illness or acute other concerns. Denies CP/palp/SOB/HA/congestion/fevers/GI or GU c/o. Taking meds as prescribed  Past Medical History  Diagnosis Date  . HYPERLIPIDEMIA 10/01/2006  . MIGRAINE HEADACHE 09/10/2006  . PHOTOPHOBIA 07/20/2009  . HYPERTENSION 10/06/2007  . ALLERGIC RHINITIS CAUSE UNSPECIFIED 02/02/2008  . Irritable bowel syndrome 09/10/2006  . VAGINITIS, ATROPHIC 10/01/2006  . SKIN TAG 11/09/2006  . OSTEOPENIA 09/10/2006  . Headache(784.0) 10/01/2006  . Palpitations 09/10/2006  . Diarrhea 06/26/2008  . Abdominal pain, unspecified site 06/26/2008  . Fibrocystic breast disease   . IBS (irritable bowel syndrome)   . MVP (mitral valve prolapse)   . Osteopenia 11/12/2012  . Insomnia 11/14/2012  . Annual physical exam 12/26/2012  . Medicare annual wellness visit, subsequent 12/26/2012    Sees Dr Elinor Parkinson of opthamology.  [facility] 5/14       Past Surgical History  Procedure Laterality Date  . Rotator cuff repair Right   . Tonsillectomy      Family History  Problem Relation Age of Onset  . Inflammatory bowel disease Sister   . Alzheimer's disease Sister   . Pancreatic cancer Maternal Grandfather   . Coronary artery disease Neg Hx   . Alzheimer's disease Mother   . Hyperlipidemia Son   . Obesity Son   . Hyperlipidemia Son     History   Social History  . Marital  Status: Widowed    Spouse Name: N/A    Number of Children: 4  . Years of Education: N/A   Occupational History  . retired    Social History Main Topics  . Smoking status: Never Smoker   . Smokeless tobacco: Never Used  . Alcohol Use: No  . Drug Use: No  . Sexual Activity: Yes    Birth Control/ Protection: None     Comment: regular exercise, lives at the [place], lives with husband   Other Topics Concern  . Not on file   Social History Narrative  . No narrative on file    Current Outpatient Prescriptions on File Prior to Visit  Medication Sig Dispense Refill  . Ascorbic Acid (VITAMIN C) 1000 MG tablet Take 1,000 mg by mouth daily.        [name] Kitchen aspirin 81 MG tablet Take 81 mg by mouth daily.        [name] Kitchen atenolol (TENORMIN) 25 MG tablet Half tab daily  100 tablet  2  . Calcium Carbonate-Vitamin D (CALCIUM PLUS VITAMIN D PO) Take 1,200 tablets by mouth daily.       [name] Kitchen conjugated estrogens (PREMARIN) vaginal cream Place 0.5 g vaginally once a week.       . fish oil-omega-3 fatty acids 1000 MG capsule Take 2 g by mouth daily.        . Glucosamine-Chondroit-Vit C-Mn (GLUCOSAMINE 1500 COMPLEX) CAPS  Take 1,500 capsules by mouth 2 (two) times daily.        [name] Kitchen loperamide (IMODIUM) 2 MG capsule Take 2 mg by mouth 4 (four) times daily as needed. For diarrhea      . Multiple Vitamin (MULTIVITAMIN) tablet Take 1 tablet by mouth daily.        . simvastatin (ZOCOR) 10 MG tablet Take 1 tablet (10 mg total) by mouth at bedtime.  100 tablet  3   No current facility-administered medications on file prior to visit.    Allergies  Allergen Reactions  . Codeine     REACTION: Nausea  Vomiting    See ROS below  Physical Exam  Constitutional: She is oriented to person, place, and time and well-developed, well-nourished, and in no distress. No distress.  HENT:  Head: Normocephalic and atraumatic.  Right Ear: External ear normal.  Left Ear: External ear normal.  Nose: Nose normal.  Mouth/Throat:  Oropharynx is clear and moist. No oropharyngeal exudate.  Eyes: Conjunctivae are normal. Pupils are equal, round, and reactive to light. Right eye exhibits no discharge. Left eye exhibits no discharge. No scleral icterus.  Neck: Normal range of motion. Neck supple. No thyromegaly present.  Cardiovascular: Normal rate, regular rhythm, normal heart sounds and intact distal pulses.   Pulmonary/Chest: Effort normal and breath sounds normal. No respiratory distress. She has no wheezes. She has no rales.  Abdominal: Soft. Bowel sounds are normal. She exhibits no distension and no mass. There is no tenderness.  Musculoskeletal: Normal range of motion. She exhibits no edema and no tenderness.  Lymphadenopathy:    She has no cervical adenopathy.  Neurological: She is alert and oriented to person, place, and time. She has normal reflexes. No cranial nerve deficit. Coordination normal.  Skin: Skin is warm and dry. No rash noted. She is not diaphoretic.  Psychiatric: Mood, memory and affect normal.    Objective  BP 146/71  Pulse 60  Temp(Src) 97.6 F (36.4 C) (Oral)  Ht 5' 3.75" (1.619 m)  Wt 124 lb (56.246 kg)  BMI 21.46 kg/m2  SpO2 100%  Physical Exam   Review of Systems  Constitutional: Negative for fever and malaise/fatigue.  HENT: Negative for congestion.   Eyes: Negative for discharge.  Respiratory: Negative for shortness of breath.   Cardiovascular: Negative for chest pain, palpitations and leg swelling.  Gastrointestinal: Negative for nausea, abdominal pain and diarrhea.  Genitourinary: Negative for dysuria.  Musculoskeletal: Positive for joint pain. Negative for falls.  Skin: Negative for rash.  Neurological: Negative for loss of consciousness and headaches.  Endo/Heme/Allergies: Negative for polydipsia.  Psychiatric/Behavioral: Negative for depression and suicidal ideas. The patient is not nervous/anxious and does not have insomnia.     Lab Results  Component Value Date    TSH 1.69 12/30/2013   Lab Results  Component Value Date   WBC 8.0 12/30/2013   HGB 14.8 12/30/2013   HCT 45.1 12/30/2013   MCV 98.2 12/30/2013   PLT 258.0 12/30/2013   Lab Results  Component Value Date   CREATININE 1.0 12/30/2013   BUN 23 12/30/2013   NA 138 12/30/2013   K 3.9 12/30/2013   CL 102 12/30/2013   CO2 29 12/30/2013   Lab Results  Component Value Date   ALT 25 12/30/2013   AST 31 12/30/2013   ALKPHOS 45 12/30/2013   BILITOT 1.6* 12/30/2013   Lab Results  Component Value Date   CHOL 191 12/30/2013   Lab Results  Component Value Date  HDL 76.20 12/30/2013   Lab Results  Component Value Date   LDLCALC 94 12/30/2013   Lab Results  Component Value Date   TRIG 106.0 12/30/2013   Lab Results  Component Value Date   CHOLHDL 3 12/30/2013     Assessment & Plan  Essential hypertension Poorly controlled will alter medications, encouraged DASH diet, minimize caffeine and obtain adequate sleep. Report concerning symptoms and follow up as directed and as needed. Add Losartan 25 mg daily  Medicare annual wellness visit, subsequent Patient denies any difficulties at home. No trouble with ADLs, depression or falls. No recent changes to vision or hearing. Is UTD with immunizations. Is UTD with screening. Discussed Advanced Directives, patient agrees to bring [place] copies of documents if can. Encouraged heart healthy diet, exercise as tolerated and adequate sleep. Given flu shot today. Saw gastroenterology recently and they agreed that she does not need any further screening colonoscopies. Declines further paps, [facility] August 2015. Labs performed and reviewed today. Follows with Dr Lucinda Dell of opthamology  Hyperlipidemia Tolerating statin, encouraged heart healthy diet, avoid trans fats, minimize simple carbs and saturated fats. Increase exercise as tolerated  Lumbar back pain with radiculopathy affecting left lower extremity Doing much better after a course of physical  therapy

## 2013-12-30 NOTE — Assessment & Plan Note (Signed)
Patient denies any difficulties at home. No trouble with ADLs, depression or falls. No recent changes to vision or hearing. Is UTD with immunizations. Is UTD with screening. Discussed Advanced Directives, patient agrees to bring [place] copies of documents if can. Encouraged heart healthy diet, exercise as tolerated and adequate sleep. Given flu shot today. Saw gastroenterology recently and they agreed that she does not need any further screening colonoscopies. Declines further paps, [facility] August 2015. Labs performed and reviewed today. Follows with Dr Lucinda Dell of opthamology

## 2013-12-30 NOTE — Assessment & Plan Note (Signed)
Tolerating statin, encouraged heart healthy diet, avoid trans fats, minimize simple carbs and saturated fats. Increase exercise as tolerated 

## 2013-12-30 NOTE — Patient Instructions (Signed)
1 month nurse visit for bp check  6 month visit with MD   Hypertension Hypertension, commonly called high blood pressure, is when the force of blood pumping through your arteries is too strong. Your arteries are the blood vessels that carry blood from your heart throughout your body. A blood pressure reading consists of a higher number over a lower number, such as 110/72. The higher number (systolic) is the pressure inside your arteries when your heart pumps. The lower number (diastolic) is the pressure inside your arteries when your heart relaxes. Ideally you want your blood pressure below 120/80. Hypertension forces your heart to work harder to pump blood. Your arteries may become narrow or stiff. Having hypertension puts you at risk for heart disease, stroke, and other problems.  RISK FACTORS Some risk factors for high blood pressure are controllable. Others are not.  Risk factors you cannot control include:   Race. You may be at higher risk if you are African American.  Age. Risk increases with age.  Gender. Men are at higher risk than women before age 72 years. After age 75, women are at higher risk than men. Risk factors you can control include:  Not getting enough exercise or physical activity.  Being overweight.  Getting too much fat, sugar, calories, or salt in your diet.  Drinking too much alcohol. SIGNS AND SYMPTOMS Hypertension does not usually cause signs or symptoms. Extremely high blood pressure (hypertensive crisis) may cause headache, anxiety, shortness of breath, and nosebleed. DIAGNOSIS  To check if you have hypertension, your health care provider will measure your blood pressure while you are seated, with your arm held at the level of your heart. It should be measured at least twice using the same arm. Certain conditions can cause a difference in blood pressure between your right and left arms. A blood pressure reading that is higher than normal on one occasion does  not mean that you need treatment. If one blood pressure reading is high, ask your health care provider about having it checked again. TREATMENT  Treating high blood pressure includes making lifestyle changes and possibly taking medicine. Living a healthy lifestyle can help lower high blood pressure. You may need to change some of your habits. Lifestyle changes may include:  Following the DASH diet. This diet is high in fruits, vegetables, and whole grains. It is low in salt, red meat, and added sugars.  Getting at least 2 hours of brisk physical activity every week.  Losing weight if necessary.  Not smoking.  Limiting alcoholic beverages.  Learning ways to reduce stress. If lifestyle changes are not enough to get your blood pressure under control, your health care provider may prescribe medicine. You may need to take more than one. Work closely with your health care provider to understand the risks and benefits. HOME CARE INSTRUCTIONS  Have your blood pressure rechecked as directed by your health care provider.   Take medicines only as directed by your health care provider. Follow the directions carefully. Blood pressure medicines must be taken as prescribed. The medicine does not work as well when you skip doses. Skipping doses also puts you at risk for problems.   Do not smoke.   Monitor your blood pressure at home as directed by your health care provider. SEEK MEDICAL CARE IF:   You think you are having a reaction to medicines taken.  You have recurrent headaches or feel dizzy.  You have swelling in your ankles.  You have trouble with  your vision. SEEK IMMEDIATE MEDICAL CARE IF:  You develop a severe headache or confusion.  You have unusual weakness, numbness, or feel faint.  You have severe chest or abdominal pain.  You vomit repeatedly.  You have trouble breathing. MAKE SURE YOU:   Understand these instructions.  Will watch your condition.  Will get help  right away if you are not doing well or get worse. Document Released: 02/17/2005 Document Revised: 07/04/2013 Document Reviewed: 12/10/2012 [facility] Patient Information 2015 [place], [place]. This information is not intended to replace advice given to you by your health care provider. Make sure you discuss any questions you have with your health care provider.

## 2013-12-30 NOTE — Progress Notes (Signed)
Pre visit review using our clinic review tool, if applicable. No additional management support is needed unless otherwise documented below in the visit note. 

## 2013-12-30 NOTE — Assessment & Plan Note (Signed)
Doing much better after a course of physical therapy

## 2013-12-30 NOTE — Assessment & Plan Note (Signed)
Poorly controlled will alter medications, encouraged DASH diet, minimize caffeine and obtain adequate sleep. Report concerning symptoms and follow up as directed and as needed. Add Losartan 25 mg daily 

## 2014-01-02 ENCOUNTER — Ambulatory Visit: Payer: Medicare Other | Admitting: Physical Therapy

## 2014-01-02 ENCOUNTER — Encounter: Payer: Self-pay | Admitting: Family Medicine

## 2014-01-02 LAB — VARICELLA ZOSTER ANTIBODY, IGG: VARICELLA IGG: 1689 {index} — AB (ref <135.00)

## 2014-01-20 ENCOUNTER — Telehealth: Payer: Self-pay | Admitting: Family Medicine

## 2014-01-20 DIAGNOSIS — E785 Hyperlipidemia, unspecified: Secondary | ICD-10-CM

## 2014-01-20 DIAGNOSIS — I10 Essential (primary) hypertension: Secondary | ICD-10-CM

## 2014-01-20 MED ORDER — ATENOLOL 25 MG PO TABS: ORAL_TABLET | ORAL | Status: DC

## 2014-01-20 MED ORDER — SIMVASTATIN 10 MG PO TABS: 10.0000 mg | ORAL_TABLET | Freq: Every day | ORAL | Status: DC

## 2014-01-20 NOTE — Telephone Encounter (Signed)
Caller name: Victoriya, Pol Relation to FD:VOUZ  Call back number: [phone] Pharmacy: [facility], [place], [place] [address] [phone]  Reason for call:  Pt requesting a refill please send to retail pt has a couple pills left.  simvastatin (ZOCOR) 10 MG tablet & atenolol (TENORMIN) 25 MG tablet

## 2014-02-01 ENCOUNTER — Ambulatory Visit (INDEPENDENT_AMBULATORY_CARE_PROVIDER_SITE_OTHER): Payer: Medicare Other

## 2014-02-01 VITALS — BP 162/92 | HR 86 | Wt 124.0 lb

## 2014-02-01 DIAGNOSIS — I10 Essential (primary) hypertension: Secondary | ICD-10-CM

## 2014-02-01 NOTE — Progress Notes (Signed)
Pt informed that she had been "neglectful" in taking her medications over the holidays last week, and wanted to inform Dr. Charlett Blake.

## 2014-02-01 NOTE — Progress Notes (Signed)
Pre visit review using our clinic review tool, if applicable. No additional management support is needed unless otherwise documented below in the visit note. 

## 2014-02-28 ENCOUNTER — Other Ambulatory Visit: Payer: Self-pay

## 2014-02-28 DIAGNOSIS — I10 Essential (primary) hypertension: Secondary | ICD-10-CM

## 2014-02-28 DIAGNOSIS — E785 Hyperlipidemia, unspecified: Secondary | ICD-10-CM

## 2014-02-28 MED ORDER — SIMVASTATIN 10 MG PO TABS: 10.0000 mg | ORAL_TABLET | Freq: Every day | ORAL | Status: DC

## 2014-02-28 MED ORDER — ATENOLOL 25 MG PO TABS: ORAL_TABLET | ORAL | Status: DC

## 2014-02-28 MED ORDER — LOSARTAN POTASSIUM 25 MG PO TABS: 25.0000 mg | ORAL_TABLET | Freq: Every day | ORAL | Status: DC

## 2014-03-13 ENCOUNTER — Telehealth: Payer: Self-pay | Admitting: Family Medicine

## 2014-03-13 NOTE — Telephone Encounter (Signed)
Pt called wanting to change RX location from [facility] to [facility]

## 2014-03-15 NOTE — Telephone Encounter (Signed)
Preferred pharmacy changed on med list.

## 2014-03-21 NOTE — Telephone Encounter (Signed)
Patient called back stating that [facility] did not receive refills. Please send atenolol, simvastatin, losartan refills

## 2014-03-21 NOTE — Telephone Encounter (Signed)
Routed to Dr. Frederik Pear CMA, [place]irt[place] Bouchard.

## 2014-03-23 ENCOUNTER — Other Ambulatory Visit: Payer: Self-pay

## 2014-03-23 DIAGNOSIS — E785 Hyperlipidemia, unspecified: Secondary | ICD-10-CM

## 2014-03-23 DIAGNOSIS — I10 Essential (primary) hypertension: Secondary | ICD-10-CM

## 2014-03-23 MED ORDER — LOSARTAN POTASSIUM 25 MG PO TABS: 25.0000 mg | ORAL_TABLET | Freq: Every day | ORAL | Status: DC

## 2014-03-23 MED ORDER — ATENOLOL 25 MG PO TABS: ORAL_TABLET | ORAL | Status: DC

## 2014-03-23 MED ORDER — SIMVASTATIN 10 MG PO TABS: 10.0000 mg | ORAL_TABLET | Freq: Every day | ORAL | Status: DC

## 2014-03-23 NOTE — Telephone Encounter (Signed)
Rx's filled on 03/23/14.//AB/CMA

## 2014-03-31 ENCOUNTER — Ambulatory Visit: Payer: Self-pay | Admitting: Family Medicine

## 2014-07-03 ENCOUNTER — Ambulatory Visit (INDEPENDENT_AMBULATORY_CARE_PROVIDER_SITE_OTHER): Payer: Medicare Other | Admitting: Family Medicine

## 2014-07-03 ENCOUNTER — Encounter: Payer: Self-pay | Admitting: Family Medicine

## 2014-07-03 VITALS — BP 132/88 | HR 72 | Temp 98.1°F | Ht 63.5 in | Wt 125.1 lb

## 2014-07-03 DIAGNOSIS — M5489 Other dorsalgia: Secondary | ICD-10-CM

## 2014-07-03 DIAGNOSIS — E785 Hyperlipidemia, unspecified: Secondary | ICD-10-CM

## 2014-07-03 DIAGNOSIS — R202 Paresthesia of skin: Secondary | ICD-10-CM

## 2014-07-03 DIAGNOSIS — R739 Hyperglycemia, unspecified: Secondary | ICD-10-CM | POA: Diagnosis not present

## 2014-07-03 DIAGNOSIS — L989 Disorder of the skin and subcutaneous tissue, unspecified: Secondary | ICD-10-CM

## 2014-07-03 DIAGNOSIS — B351 Tinea unguium: Secondary | ICD-10-CM

## 2014-07-03 DIAGNOSIS — I10 Essential (primary) hypertension: Secondary | ICD-10-CM

## 2014-07-03 DIAGNOSIS — M25519 Pain in unspecified shoulder: Secondary | ICD-10-CM | POA: Diagnosis not present

## 2014-07-03 MED ORDER — LOSARTAN POTASSIUM 25 MG PO TABS: 25.0000 mg | ORAL_TABLET | Freq: Every day | ORAL | Status: DC

## 2014-07-03 MED ORDER — ATENOLOL 25 MG PO TABS: ORAL_TABLET | ORAL | Status: DC

## 2014-07-03 MED ORDER — SIMVASTATIN 10 MG PO TABS: 10.0000 mg | ORAL_TABLET | Freq: Every day | ORAL | Status: DC

## 2014-07-03 NOTE — Patient Instructions (Signed)
Try soaking feet in 1/2 distilled white vinegar and 1/2 hot water for 15 minutes each evening and then dry and rub down toenail with Vick's Vapor Rub  Ringworm, Nail A fungal infection of the nail (tinea unguium/onychomycosis) is common. It is common as the visible part of the nail is composed of dead cells which have no blood supply to help prevent infection. It occurs because fungi are everywhere and will pick any opportunity to grow on any dead material. Because nails are very slow growing they require up to 2 years of treatment with anti-fungal medications. The entire nail back to the base is infected. This includes approximately  of the nail which you cannot see. If your caregiver has prescribed a medication by mouth, take it every day and as directed. No progress will be seen for at least 6 to 9 months. Do not be disappointed! Because fungi live on dead cells with little or no exposure to blood supply, medication delivery to the infection is slow; thus the cure is slow. It is also why you can observe no progress in the first 6 months. The nail becoming cured is the base of the nail, as it has the blood supply. Topical medication such as creams and ointments are usually not effective. Important in successful treatment of nail fungus is closely following the medication regimen that your doctor prescribes. Sometimes you and your caregiver may elect to speed up this process by surgical removal of all the nails. Even this may still require 6 to 9 months of additional oral medications. See your caregiver as directed. Remember there will be no visible improvement for at least 6 months. See your caregiver sooner if other signs of infection (redness and swelling) develop. Document Released: 02/15/2000 Document Revised: 05/12/2011 Document Reviewed: 04/25/2008 [facility] Patient Information 2015 [place], [place]. This information is not intended to replace advice given to you by your health care provider. Make  sure you discuss any questions you have with your health care provider.

## 2014-07-03 NOTE — Progress Notes (Signed)
Pre visit review using our clinic review tool, if applicable. No additional management support is needed unless otherwise documented below in the visit note. 

## 2014-07-03 NOTE — Progress Notes (Signed)
Christine Henderson  [id number] 1930/09/07 07/03/2014      Progress Note-Follow Up  Subjective  Chief Complaint  Chief Complaint  Patient presents with  . Follow-up    6 month    HPI  Patient is a 81 y.o. female in today for routine medical care. Patient in today for follow-up. Notes some paresthesias and numbness in both feet. No recent injury. Is also complaining of thickness in both big toenails for several months. Notes the tingling in her feet is worse with warm weather. Finally complains of a pruritic lesion on her left leg near her knee. No recent illness. Denies CP/palp/SOB/HA/congestion/fevers/GI or GU c/o. Taking meds as prescribed  Past Medical History  Diagnosis Date  . HYPERLIPIDEMIA 10/01/2006  . MIGRAINE HEADACHE 09/10/2006  . PHOTOPHOBIA 07/20/2009  . HYPERTENSION 10/06/2007  . ALLERGIC RHINITIS CAUSE UNSPECIFIED 02/02/2008  . Irritable bowel syndrome 09/10/2006  . VAGINITIS, ATROPHIC 10/01/2006  . SKIN TAG 11/09/2006  . OSTEOPENIA 09/10/2006  . Headache(784.0) 10/01/2006  . Palpitations 09/10/2006  . Diarrhea 06/26/2008  . Abdominal pain, unspecified site 06/26/2008  . Fibrocystic breast disease   . IBS (irritable bowel syndrome)   . MVP (mitral valve prolapse)   . Osteopenia 11/12/2012  . Insomnia 11/14/2012  . Annual physical exam 12/26/2012  . Medicare annual wellness visit, subsequent 12/26/2012    Sees Dr Elinor Parkinson of opthamology.  [facility] 5/14       Past Surgical History  Procedure Laterality Date  . Rotator cuff repair Right   . Tonsillectomy      Family History  Problem Relation Age of Onset  . Inflammatory bowel disease Sister   . Alzheimer's disease Sister   . Pancreatic cancer Maternal Grandfather   . Coronary artery disease Neg Hx   . Alzheimer's disease Mother   . Hyperlipidemia Son   . Obesity Son   . Hyperlipidemia Son     History   Social History  . Marital Status: Widowed    Spouse Name: N/A  . Number of Children: 4  . Years of  Education: N/A   Occupational History  . retired    Social History Main Topics  . Smoking status: Never Smoker   . Smokeless tobacco: Never Used  . Alcohol Use: No  . Drug Use: No  . Sexual Activity: Yes    Birth Control/ Protection: None     Comment: regular exercise, lives at the [facility], lives with husband   Other Topics Concern  . Not on file   Social History Narrative    Current Outpatient Prescriptions on File Prior to Visit  Medication Sig Dispense Refill  . Ascorbic Acid (VITAMIN C) 1000 MG tablet Take 1,000 mg by mouth daily.      [name] Kitchen aspirin 81 MG tablet Take 81 mg by mouth daily.      . Calcium Carbonate-Vitamin D (CALCIUM PLUS VITAMIN D PO) Take 1,200 tablets by mouth daily.     [name] Kitchen conjugated estrogens (PREMARIN) vaginal cream Place 0.5 g vaginally once a week.     . fish oil-omega-3 fatty acids 1000 MG capsule Take 2 g by mouth daily.      . Glucosamine-Chondroit-Vit C-Mn (GLUCOSAMINE 1500 COMPLEX) CAPS Take 1,500 capsules by mouth 2 (two) times daily.      [name] Kitchen loperamide (IMODIUM) 2 MG capsule Take 2 mg by mouth 4 (four) times daily as needed. For diarrhea    . Multiple Vitamin (MULTIVITAMIN) tablet Take 1 tablet by mouth daily.  No current facility-administered medications on file prior to visit.    Allergies  Allergen Reactions  . Codeine     REACTION: Nausea  Vomiting    Review of Systems  Review of Systems  Constitutional: Negative for fever and malaise/fatigue.  HENT: Negative for congestion.   Eyes: Negative for discharge.  Respiratory: Negative for cough and shortness of breath.   Cardiovascular: Negative for chest pain, palpitations and leg swelling.  Gastrointestinal: Negative for nausea, abdominal pain and diarrhea.  Genitourinary: Negative for dysuria.  Musculoskeletal: Positive for back pain. Negative for falls.       Right shoulder pain intermittently while walking  Skin: Positive for rash.  Neurological: Negative for loss of  consciousness and headaches.  Endo/Heme/Allergies: Negative for polydipsia.  Psychiatric/Behavioral: Negative for depression and suicidal ideas. The patient is not nervous/anxious and does not have insomnia.     Objective  BP 132/88 mmHg  Pulse 72  Temp(Src) 98.1 F (36.7 C) (Oral)  Ht 5' 3.5" (1.613 m)  Wt 125 lb 2 oz (56.756 kg)  BMI 21.81 kg/m2  SpO2 97%  Physical Exam  Physical Exam  Constitutional: She is oriented to person, place, and time and well-developed, well-nourished, and in no distress. No distress.  HENT:  Head: Normocephalic and atraumatic.  Eyes: Conjunctivae are normal.  Neck: Neck supple. No thyromegaly present.  Cardiovascular: Normal rate, regular rhythm and normal heart sounds.   No murmur heard. Pulmonary/Chest: Effort normal and breath sounds normal. She has no wheezes.  Abdominal: She exhibits no distension and no mass.  Musculoskeletal: She exhibits no edema.  Lymphadenopathy:    She has no cervical adenopathy.  Neurological: She is alert and oriented to person, place, and time.  Skin: Skin is warm and dry. No rash noted. She is not diaphoretic.  Lesion on left leg. Thick toenails  Psychiatric: Memory, affect and judgment normal.    Lab Results  Component Value Date   TSH 1.69 12/30/2013   Lab Results  Component Value Date   WBC 8.0 12/30/2013   HGB 14.8 12/30/2013   HCT 45.1 12/30/2013   MCV 98.2 12/30/2013   PLT 258.0 12/30/2013   Lab Results  Component Value Date   CREATININE 1.0 12/30/2013   BUN 23 12/30/2013   NA 138 12/30/2013   K 3.9 12/30/2013   CL 102 12/30/2013   CO2 29 12/30/2013   Lab Results  Component Value Date   ALT 25 12/30/2013   AST 31 12/30/2013   ALKPHOS 45 12/30/2013   BILITOT 1.6* 12/30/2013   Lab Results  Component Value Date   CHOL 191 12/30/2013   Lab Results  Component Value Date   HDL 76.20 12/30/2013   Lab Results  Component Value Date   LDLCALC 94 12/30/2013   Lab Results  Component  Value Date   TRIG 106.0 12/30/2013   Lab Results  Component Value Date   CHOLHDL 3 12/30/2013     Assessment & Plan  Essential hypertension Well controlled, no changes to meds. Encouraged heart healthy diet such as the DASH diet and exercise as tolerated. EKG Sinus rhythm   Hyperlipidemia Tolerating statin, encouraged heart healthy diet, avoid trans fats, minimize simple carbs and saturated fats. Increase exercise as tolerated   Hyperglycemia hgba1c acceptable, minimize simple carbs. Increase exercise as tolerated. Continue current meds   Leg lesion Referred to dermatology for further consideration   Paresthesia of foot, bilateral Encouraged to try shoe inserts and will need to minimize simple carbs, if  worsens then may consider medicaitons and further work up.   Onychomycosis Encouraged to soak in distilled white vinegar each evening and then apply vick's vapor rub or Lamisil cream.

## 2014-07-03 NOTE — Assessment & Plan Note (Addendum)
Well controlled, no changes to meds. Encouraged heart healthy diet such as the DASH diet and exercise as tolerated. EKG Sinus rhythm

## 2014-07-04 LAB — HEMOGLOBIN A1C: HEMOGLOBIN A1C: 6.1 % (ref 4.6–6.5)

## 2014-07-04 LAB — CBC
HCT: 38.9 % (ref 36.0–46.0)
Hemoglobin: 13.1 g/dL (ref 12.0–15.0)
MCHC: 33.6 g/dL (ref 30.0–36.0)
MCV: 94.9 fl (ref 78.0–100.0)
Platelets: 222 10*3/uL (ref 150.0–400.0)
RBC: 4.1 Mil/uL (ref 3.87–5.11)
RDW: 14.5 % (ref 11.5–15.5)
WBC: 6.5 10*3/uL (ref 4.0–10.5)

## 2014-07-04 LAB — VITAMIN B12: Vitamin B-12: >1500 pg/mL — ABNORMAL HIGH (ref 211–911)

## 2014-07-04 LAB — MAGNESIUM: MAGNESIUM: 1.8 mg/dL (ref 1.5–2.5)

## 2014-07-04 LAB — COMPREHENSIVE METABOLIC PANEL
ALT: 19 U/L (ref 0–35)
AST: 25 U/L (ref 0–37)
Albumin: 3.9 g/dL (ref 3.5–5.2)
Alkaline Phosphatase: 42 U/L (ref 39–117)
BUN: 21 mg/dL (ref 6–23)
CALCIUM: 9.8 mg/dL (ref 8.4–10.5)
CHLORIDE: 102 meq/L (ref 96–112)
CO2: 30 meq/L (ref 19–32)
Creatinine, Ser: 1.03 mg/dL (ref 0.40–1.20)
GFR: 54.49 mL/min — AB (ref >60.00)
Glucose, Bld: 94 mg/dL (ref 70–99)
POTASSIUM: 4.1 meq/L (ref 3.5–5.1)
SODIUM: 137 meq/L (ref 135–145)
TOTAL PROTEIN: 7 g/dL (ref 6.0–8.3)
Total Bilirubin: 1.2 mg/dL (ref 0.2–1.2)

## 2014-07-04 LAB — TSH: TSH: 1.67 u[IU]/mL (ref 0.35–4.50)

## 2014-07-06 ENCOUNTER — Telehealth: Payer: Self-pay | Admitting: Family Medicine

## 2014-07-06 NOTE — Telephone Encounter (Signed)
Relation to pt: self Call back number: [phone]   Reason for call:  Pt returning your call regarding lab results

## 2014-07-06 NOTE — Telephone Encounter (Signed)
Patient informed of results.  

## 2014-07-08 ENCOUNTER — Encounter: Payer: Self-pay | Admitting: Family Medicine

## 2014-07-09 ENCOUNTER — Encounter: Payer: Self-pay | Admitting: Family Medicine

## 2014-07-09 DIAGNOSIS — R202 Paresthesia of skin: Secondary | ICD-10-CM | POA: Insufficient documentation

## 2014-07-09 DIAGNOSIS — R739 Hyperglycemia, unspecified: Secondary | ICD-10-CM | POA: Insufficient documentation

## 2014-07-09 DIAGNOSIS — L989 Disorder of the skin and subcutaneous tissue, unspecified: Secondary | ICD-10-CM | POA: Insufficient documentation

## 2014-07-09 DIAGNOSIS — B351 Tinea unguium: Secondary | ICD-10-CM

## 2014-07-09 HISTORY — DX: Paresthesia of skin: R20.2

## 2014-07-09 HISTORY — DX: Tinea unguium: B35.1

## 2014-07-09 HISTORY — DX: Hyperglycemia, unspecified: R73.9

## 2014-07-09 NOTE — Assessment & Plan Note (Signed)
hgba1c acceptable, minimize simple carbs. Increase exercise as tolerated. Continue current meds 

## 2014-07-09 NOTE — Assessment & Plan Note (Signed)
Tolerating statin, encouraged heart healthy diet, avoid trans fats, minimize simple carbs and saturated fats. Increase exercise as tolerated 

## 2014-07-09 NOTE — Assessment & Plan Note (Signed)
Referred to dermatology for further consideration.  

## 2014-07-09 NOTE — Assessment & Plan Note (Signed)
Encouraged to try shoe inserts and will need to minimize simple carbs, if worsens then may consider medicaitons and further work up.

## 2014-07-09 NOTE — Assessment & Plan Note (Signed)
Encouraged to soak in distilled white vinegar each evening and then apply vick's vapor rub or Lamisil cream.

## 2014-08-11 DIAGNOSIS — L814 Other melanin hyperpigmentation: Secondary | ICD-10-CM | POA: Diagnosis not present

## 2014-08-11 DIAGNOSIS — Q829 Congenital malformation of skin, unspecified: Secondary | ICD-10-CM | POA: Diagnosis not present

## 2014-08-11 DIAGNOSIS — L821 Other seborrheic keratosis: Secondary | ICD-10-CM | POA: Diagnosis not present

## 2014-08-11 DIAGNOSIS — L570 Actinic keratosis: Secondary | ICD-10-CM | POA: Diagnosis not present

## 2014-09-01 HISTORY — PX: DENTAL SURGERY: SHX609

## 2014-09-27 ENCOUNTER — Ambulatory Visit (INDEPENDENT_AMBULATORY_CARE_PROVIDER_SITE_OTHER): Payer: Medicare Other | Admitting: Medical

## 2014-09-27 ENCOUNTER — Emergency Department (HOSPITAL_BASED_OUTPATIENT_CLINIC_OR_DEPARTMENT_OTHER): Payer: Medicare Other

## 2014-09-27 ENCOUNTER — Encounter (HOSPITAL_BASED_OUTPATIENT_CLINIC_OR_DEPARTMENT_OTHER): Payer: Self-pay | Admitting: Emergency Medicine

## 2014-09-27 ENCOUNTER — Emergency Department (HOSPITAL_BASED_OUTPATIENT_CLINIC_OR_DEPARTMENT_OTHER)
Admission: EM | Admit: 2014-09-27 | Discharge: 2014-09-27 | Disposition: A | Discharge status: Home / Self Care | Payer: Medicare Other | Attending: Emergency Medicine | Admitting: Emergency Medicine

## 2014-09-27 ENCOUNTER — Encounter: Payer: Self-pay | Admitting: Medical

## 2014-09-27 VITALS — BP 150/96 | HR 96 | Temp 98.7°F | Ht 63.5 in | Wt 124.2 lb

## 2014-09-27 DIAGNOSIS — Z8719 Personal history of other diseases of the digestive system: Secondary | ICD-10-CM | POA: Insufficient documentation

## 2014-09-27 DIAGNOSIS — W19XXXA Unspecified fall, initial encounter: Secondary | ICD-10-CM | POA: Diagnosis not present

## 2014-09-27 DIAGNOSIS — T148 Other injury of unspecified body region: Secondary | ICD-10-CM

## 2014-09-27 DIAGNOSIS — Z8669 Personal history of other diseases of the nervous system and sense organs: Secondary | ICD-10-CM | POA: Diagnosis not present

## 2014-09-27 DIAGNOSIS — S01511A Laceration without foreign body of lip, initial encounter: Secondary | ICD-10-CM | POA: Diagnosis not present

## 2014-09-27 DIAGNOSIS — Y92009 Unspecified place in unspecified non-institutional (private) residence as the place of occurrence of the external cause: Secondary | ICD-10-CM | POA: Insufficient documentation

## 2014-09-27 DIAGNOSIS — Z23 Encounter for immunization: Secondary | ICD-10-CM | POA: Diagnosis not present

## 2014-09-27 DIAGNOSIS — Z8619 Personal history of other infectious and parasitic diseases: Secondary | ICD-10-CM | POA: Insufficient documentation

## 2014-09-27 DIAGNOSIS — W01198A Fall on same level from slipping, tripping and stumbling with subsequent striking against other object, initial encounter: Secondary | ICD-10-CM | POA: Diagnosis not present

## 2014-09-27 DIAGNOSIS — S0993XA Unspecified injury of face, initial encounter: Secondary | ICD-10-CM | POA: Diagnosis not present

## 2014-09-27 DIAGNOSIS — Y9389 Activity, other specified: Secondary | ICD-10-CM | POA: Diagnosis not present

## 2014-09-27 DIAGNOSIS — Y998 Other external cause status: Secondary | ICD-10-CM | POA: Diagnosis not present

## 2014-09-27 DIAGNOSIS — IMO0002 Reserved for concepts with insufficient information to code with codable children: Secondary | ICD-10-CM

## 2014-09-27 DIAGNOSIS — Z79899 Other long term (current) drug therapy: Secondary | ICD-10-CM | POA: Diagnosis not present

## 2014-09-27 DIAGNOSIS — Z8742 Personal history of other diseases of the female genital tract: Secondary | ICD-10-CM | POA: Diagnosis not present

## 2014-09-27 DIAGNOSIS — E785 Hyperlipidemia, unspecified: Secondary | ICD-10-CM | POA: Insufficient documentation

## 2014-09-27 DIAGNOSIS — M8580 Other specified disorders of bone density and structure, unspecified site: Secondary | ICD-10-CM | POA: Diagnosis not present

## 2014-09-27 DIAGNOSIS — I10 Essential (primary) hypertension: Secondary | ICD-10-CM | POA: Insufficient documentation

## 2014-09-27 DIAGNOSIS — Z7982 Long term (current) use of aspirin: Secondary | ICD-10-CM | POA: Insufficient documentation

## 2014-09-27 DIAGNOSIS — K002 Abnormalities of size and form of teeth: Secondary | ICD-10-CM | POA: Insufficient documentation

## 2014-09-27 DIAGNOSIS — Z872 Personal history of diseases of the skin and subcutaneous tissue: Secondary | ICD-10-CM | POA: Diagnosis not present

## 2014-09-27 DIAGNOSIS — S0181XA Laceration without foreign body of other part of head, initial encounter: Secondary | ICD-10-CM

## 2014-09-27 MED ORDER — LIDOCAINE-EPINEPHRINE 1 %-1:100000 IJ SOLN
INTRAMUSCULAR | Status: AC
  Administered 2014-09-27: 20 mL via INTRADERMAL
  Filled 2014-09-27: qty 1

## 2014-09-27 MED ORDER — TETANUS-DIPHTH-ACELL PERTUSSIS 5-2.5-18.5 LF-MCG/0.5 IM SUSP
0.5000 mL | Freq: Once | INTRAMUSCULAR | Status: AC
  Administered 2014-09-27: 0.5 mL via INTRAMUSCULAR
  Filled 2014-09-27: qty 0.5

## 2014-09-27 MED ORDER — LIDOCAINE-EPINEPHRINE 1 %-1:100000 IJ SOLN
20.0000 mL | Freq: Once | INTRAMUSCULAR | Status: AC
  Administered 2014-09-27: 20 mL via INTRADERMAL

## 2014-09-27 NOTE — Progress Notes (Signed)
Pre visit review using our clinic review tool, if applicable. No additional management support is needed unless otherwise documented below in the visit note. 

## 2014-09-27 NOTE — Discharge Instructions (Signed)
Facial Laceration ° A facial laceration is a cut on the face. These injuries can be painful and cause bleeding. Lacerations usually heal quickly, but they need special care to reduce scarring. °DIAGNOSIS  °Your health care provider will take a medical history, ask for details about how the injury occurred, and examine the wound to determine how deep the cut is. °TREATMENT  °Some facial lacerations may not require closure. Others may not be able to be closed because of an increased risk of infection. The risk of infection and the chance for successful closure will depend on various factors, including the amount of time since the injury occurred. °The wound may be cleaned to help prevent infection. If closure is appropriate, pain medicines may be given if needed. Your health care provider will use stitches (sutures), wound glue (adhesive), or skin adhesive strips to repair the laceration. These tools bring the skin edges together to allow for faster healing and a better cosmetic outcome. If needed, you may also be given a tetanus shot. °HOME CARE INSTRUCTIONS °· Only take over-the-counter or prescription medicines as directed by your health care provider. °· Follow your health care provider's instructions for wound care. These instructions will vary depending on the technique used for closing the wound. °For Sutures: °· Keep the wound clean and dry.   °· If you were given a bandage (dressing), you should change it at least once a day. Also change the dressing if it becomes wet or dirty, or as directed by your health care provider.   °· Wash the wound with soap and water 2 times a day. Rinse the wound off with water to remove all soap. Pat the wound dry with a clean towel.   °· After cleaning, apply a thin layer of the antibiotic ointment recommended by your health care provider. This will help prevent infection and keep the dressing from sticking.   °· You may shower as usual after the first 24 hours. Do not soak the  wound in water until the sutures are removed.   °· Get your sutures removed as directed by your health care provider. With facial lacerations, sutures should usually be taken out after 4-5 days to avoid stitch marks.   °· Wait a few days after your sutures are removed before applying any makeup. °For Skin Adhesive Strips: °· Keep the wound clean and dry.   °· Do not get the skin adhesive strips wet. You may bathe carefully, using caution to keep the wound dry.   °· If the wound gets wet, pat it dry with a clean towel.   °· Skin adhesive strips will fall off on their own. You may trim the strips as the wound heals. Do not remove skin adhesive strips that are still stuck to the wound. They will fall off in time.   °For Wound Adhesive: °· You may briefly wet your wound in the shower or bath. Do not soak or scrub the wound. Do not swim. Avoid periods of heavy sweating until the skin adhesive has fallen off on its own. After showering or bathing, gently pat the wound dry with a clean towel.   °· Do not apply liquid medicine, cream medicine, ointment medicine, or makeup to your wound while the skin adhesive is in place. This may loosen the film before your wound is healed.   °· If a dressing is placed over the wound, be careful not to apply tape directly over the skin adhesive. This may cause the adhesive to be pulled off before the wound is healed.   °· Avoid   prolonged exposure to sunlight or tanning lamps while the skin adhesive is in place.  The skin adhesive will usually remain in place for 5-10 days, then naturally fall off the skin. Do not pick at the adhesive film.  After Healing: Once the wound has healed, cover the wound with sunscreen during the day for 1 full year. This can help minimize scarring. Exposure to ultraviolet light in the first year will darken the scar. It can take 1-2 years for the scar to lose its redness and to heal completely.  SEEK IMMEDIATE MEDICAL CARE IF:  You have redness, pain, or  swelling around the wound.   You see ayellowish-white fluid (pus) coming from the wound.   You have chills or a fever.  MAKE SURE YOU:  Understand these instructions.  Will watch your condition.  Will get help right away if you are not doing well or get worse. Document Released: 03/27/2004 Document Revised: 12/08/2012 Document Reviewed: 09/30/2012 [facility] Patient Information 2015 [place], [place]. This information is not intended to replace advice given to you by your health care provider. Make sure you discuss any questions you have with your health care provider.   Dental Injury Your exam shows that you have injured your teeth. The treatment of broken teeth and other dental injuries depends on how badly they are hurt. All dental injuries should be checked as soon as possible by a dentist if there are:  Loose teeth which may need to be wired or bonded with a plastic device to hold them in place.  Broken teeth with exposed tooth pulp which may cause a serious infection.  Painful teeth especially when you bite or chew.  Sharp tooth edges that cut your tongue or lips. Sometimes, antibiotics or pain medicine are prescribed to prevent infection and control pain. Eat a soft or liquid diet and rinse your mouth out after meals with warm water. You should see a dentist or return here at once if you have increased swelling, increased pain or uncontrolled bleeding from the site of your injury. SEEK MEDICAL CARE IF:   You have increased pain not controlled with medicines.  You have swelling around your tooth, in your face or neck.  You have bleeding which starts, continues, or gets worse.  You have a fever. Document Released: 02/17/2005 Document Revised: 05/12/2011 Document Reviewed: 02/16/2009 [facility] Patient Information 2015 [place], [place]. This information is not intended to replace advice given to you by your health care provider. Make sure you discuss any questions you have with  your health care provider.

## 2014-09-27 NOTE — ED Provider Notes (Signed)
CSN: [id number]     Arrival date & time 09/27/14  8527 History   First MD Initiated Contact with Patient 09/27/14 [phone]     Chief Complaint  Patient presents with  . Lip Laceration     (Consider location/radiation/quality/duration/timing/severity/associated sxs/prior Treatment) HPI Comments: Pt. Is an 81 y/o WF with hx of HTN, HLD, on ASA daily who presents after fall in which she fell onto her face after tripping over an uneven area of side walk in front of her home. She says she did not lose consciousness, she says her teeth are loose, and her overbite feels out of place. She immediately noticed bleeding and multiple lacerations above her lip and the side of her face. She says she did not have any vision changes, numbness, tingling, headache. She has not had any nausea, vomiting, peripheral weakness, numbness, or tingling. A friend who is with her, did not observe the fall, but brought her to the ED, says that she is at her baseline level of communication and consciousness. She has not had any amnesia. She says that she feels the laceration on the inside of her mouth as well. She has not had any chest pain, difficulty breathing, palpitations, or dizziness. Bleeding has slowed to a stop on its own. Her pain is minimal.   The history is provided by the patient and a friend.    Past Medical History  Diagnosis Date  . HYPERLIPIDEMIA 10/01/2006  . MIGRAINE HEADACHE 09/10/2006  . PHOTOPHOBIA 07/20/2009  . HYPERTENSION 10/06/2007  . ALLERGIC RHINITIS CAUSE UNSPECIFIED 02/02/2008  . Irritable bowel syndrome 09/10/2006  . VAGINITIS, ATROPHIC 10/01/2006  . SKIN TAG 11/09/2006  . OSTEOPENIA 09/10/2006  . Headache(784.0) 10/01/2006  . Palpitations 09/10/2006  . Diarrhea 06/26/2008  . Abdominal pain, unspecified site 06/26/2008  . Fibrocystic breast disease   . IBS (irritable bowel syndrome)   . MVP (mitral valve prolapse)   . Osteopenia 11/12/2012  . Insomnia 11/14/2012  . Annual physical exam 12/26/2012  .  Medicare annual wellness visit, subsequent 12/26/2012    Sees Dr Elinor Parkinson of opthamology.  MGM 5/14     . Onychomycosis 07/09/2014   Past Surgical History  Procedure Laterality Date  . Rotator cuff repair Right   . Tonsillectomy     Family History  Problem Relation Age of Onset  . Inflammatory bowel disease Sister   . Alzheimer's disease Sister   . Pancreatic cancer Maternal Grandfather   . Coronary artery disease Neg Hx   . Alzheimer's disease Mother   . Hyperlipidemia Son   . Obesity Son   . Hyperlipidemia Son    History  Substance Use Topics  . Smoking status: Never Smoker   . Smokeless tobacco: Never Used  . Alcohol Use: No   OB History    Gravida Para Term Preterm AB TAB SAB Ectopic Multiple Living   4 4 0 0 0 0 0 0 0 0      Review of Systems  Constitutional: Negative.  Negative for chills and diaphoresis.  HENT: Positive for dental problem. Negative for congestion, facial swelling, mouth sores, nosebleeds, sinus pressure and trouble swallowing.   Eyes: Negative.  Negative for photophobia, pain, redness and visual disturbance.  Respiratory: Negative.  Negative for cough, choking, chest tightness and shortness of breath.   Cardiovascular: Negative.  Negative for chest pain, palpitations and leg swelling.  Gastrointestinal: Negative.  Negative for nausea, vomiting and abdominal pain.  Endocrine: Negative.  Negative for polyuria.  Genitourinary: Negative for urgency  and frequency.  Musculoskeletal: Negative.  Negative for myalgias, back pain, joint swelling, gait problem, neck pain and neck stiffness.  Skin: Positive for wound. Negative for pallor and rash.  Allergic/Immunologic: Negative.  Negative for food allergies.  Neurological: Negative.  Negative for tremors, facial asymmetry, speech difficulty, weakness, light-headedness, numbness and headaches.  Hematological: Negative.   Psychiatric/Behavioral: Negative.       Allergies  Codeine  Home Medications     Prior to Admission medications   Medication Sig Start Date End Date Taking? Authorizing Provider  Ascorbic Acid (VITAMIN C) 1000 MG tablet Take 1,000 mg by mouth daily.      Historical Provider, MD  aspirin 81 MG tablet Take 81 mg by mouth daily.      Historical Provider, MD  atenolol (TENORMIN) 25 MG tablet Take 1/2 tablet daily. 07/03/14   Mosie Lukes, MD  Calcium Carbonate-Vitamin D (CALCIUM PLUS VITAMIN D PO) Take 1,200 tablets by mouth daily.     Historical Provider, MD  conjugated estrogens (PREMARIN) vaginal cream Place 0.5 g vaginally once a week.  11/11/10   Dorena Cookey, MD  fish oil-omega-3 fatty acids 1000 MG capsule Take 2 g by mouth daily.      Historical Provider, MD  Glucosamine-Chondroit-Vit C-Mn (GLUCOSAMINE 1500 COMPLEX) CAPS Take 1,500 capsules by mouth 2 (two) times daily.      Historical Provider, MD  loperamide (IMODIUM) 2 MG capsule Take 2 mg by mouth 4 (four) times daily as needed. For diarrhea    Historical Provider, MD  losartan (COZAAR) 25 MG tablet Take 1 tablet (25 mg total) by mouth daily. 07/03/14   Mosie Lukes, MD  Multiple Vitamin (MULTIVITAMIN) tablet Take 1 tablet by mouth daily.      Historical Provider, MD  simvastatin (ZOCOR) 10 MG tablet Take 1 tablet (10 mg total) by mouth at bedtime. 07/03/14   Mosie Lukes, MD   BP 153/87 mmHg  Pulse 77  Temp(Src) 98 F (36.7 C)  Resp 18  Ht 5' 4.5" (1.638 m)  Wt 120 lb (54.432 kg)  BMI 20.29 kg/m2  SpO2 96% Physical Exam  Constitutional: She is oriented to person, place, and time. She appears well-developed and well-nourished. No distress.  HENT:  Head: Normocephalic. Head is with abrasion and with laceration. Head is without raccoon's eyes, without Battle's sign and without contusion. Hair is normal.    Nose: Nose normal. No nose lacerations, sinus tenderness or nasal deformity. No epistaxis. Right sinus exhibits no maxillary sinus tenderness and no frontal sinus tenderness. Left sinus exhibits no  maxillary sinus tenderness and no frontal sinus tenderness.  Mouth/Throat: Uvula is midline, oropharynx is clear and moist and mucous membranes are normal. No oral lesions. No trismus in the jaw. Abnormal dentition. Lacerations present. No dental abscesses, uvula swelling or dental caries. No oropharyngeal exudate or posterior oropharyngeal edema.    Eyes: Conjunctivae and EOM are normal. Pupils are equal, round, and reactive to light.  Neck: Normal range of motion. Neck supple. No tracheal deviation present.  Cardiovascular: Normal rate, regular rhythm, normal heart sounds and intact distal pulses.  Exam reveals no gallop and no friction rub.   No murmur heard. Pulmonary/Chest: Effort normal and breath sounds normal. No stridor. No respiratory distress. She has no wheezes. She has no rales. She exhibits no tenderness.  Abdominal: Soft. Bowel sounds are normal. She exhibits no distension and no mass. There is no tenderness. There is no rebound and no guarding.  Musculoskeletal: Normal  range of motion. She exhibits no edema or tenderness.  Neurological: She is alert and oriented to person, place, and time. No cranial nerve deficit. She exhibits normal muscle tone. Coordination normal.  Skin: Skin is warm and dry. No rash noted. She is not diaphoretic. No erythema. No pallor.  Psychiatric: She has a normal mood and affect. Her behavior is normal.    ED Course  Procedures (including critical care time) Labs Review Labs Reviewed - No data to display  Imaging Review Ct Cervical Spine Wo Contrast  09/27/2014   CLINICAL DATA:  Right-sided facial injury after fall on concrete.  EXAM: CT MAXILLOFACIAL WITHOUT CONTRAST  CT CERVICAL SPINE WITHOUT CONTRAST  TECHNIQUE: Multidetector CT imaging of the maxillofacial structures was performed. Multiplanar CT image reconstructions were also generated. A small metallic BB was placed on the right temple in order to reliably differentiate right from left.   Multidetector CT imaging of the cervical spine was performed without intravenous contrast. Multiplanar CT image reconstructions were also generated.  COMPARISON:  None.  FINDINGS: Globes and orbits appear normal. Small left maxillary mucous retention cyst is noted. No significant sinusitis is noted. Pterygoid plates appear normal. No fracture or dislocation is noted. Degenerative change of the left temporomandibular joint is noted.  No definite fracture is noted. Severe degenerative disc disease is noted at C5-6, C6-7 and C7-T1. Minimal grade 1 anterolisthesis of C5-6 is noted secondary to posterior facet joint hypertrophy. Visualized lung apices appear normal.  IMPRESSION: No acute abnormality seen in maxillofacial region. Degenerative change of the left temporomandibular joint is noted.  Severe multilevel degenerative disc disease is noted. No fracture is noted in the cervical spine.   Electronically Signed   By: Marijo Conception, M.D.   On: 09/27/2014 11:28   Ct Maxillofacial Wo Cm  09/27/2014   CLINICAL DATA:  Right-sided facial injury after fall on concrete.  EXAM: CT MAXILLOFACIAL WITHOUT CONTRAST  CT CERVICAL SPINE WITHOUT CONTRAST  TECHNIQUE: Multidetector CT imaging of the maxillofacial structures was performed. Multiplanar CT image reconstructions were also generated. A small metallic BB was placed on the right temple in order to reliably differentiate right from left.  Multidetector CT imaging of the cervical spine was performed without intravenous contrast. Multiplanar CT image reconstructions were also generated.  COMPARISON:  None.  FINDINGS: Globes and orbits appear normal. Small left maxillary mucous retention cyst is noted. No significant sinusitis is noted. Pterygoid plates appear normal. No fracture or dislocation is noted. Degenerative change of the left temporomandibular joint is noted.  No definite fracture is noted. Severe degenerative disc disease is noted at C5-6, C6-7 and C7-T1. Minimal  grade 1 anterolisthesis of C5-6 is noted secondary to posterior facet joint hypertrophy. Visualized lung apices appear normal.  IMPRESSION: No acute abnormality seen in maxillofacial region. Degenerative change of the left temporomandibular joint is noted.  Severe multilevel degenerative disc disease is noted. No fracture is noted in the cervical spine.   Electronically Signed   By: Marijo Conception, M.D.   On: 09/27/2014 11:28     EKG Interpretation None      MDM   Final diagnoses:  Facial laceration, initial encounter  Dental injury, initial encounter    Pt. Is an 81 y/o F here with laceration to her upper lip 2cm lac requiring 4 sutures for repair, and two smaller through and through lacs requiring dermabond for repair. Maxillofacial / c-spine CT scan without evidence of acute fracture or bleed. Safe for discharge to  home with close follow up with dentistry regarding loose anterior teeth. Will need to eat soft meals for the next week. Return precautions reviewed. Wound care reviewed. Follow up with pcp as needed.     Aquilla Hacker, MD 09/27/14 1142  Blanchie Dessert, MD 09/28/14 1646

## 2014-09-27 NOTE — ED Notes (Signed)
MD at bedside. 

## 2014-09-27 NOTE — ED Notes (Signed)
Patient transported to CT 

## 2014-09-27 NOTE — ED Notes (Signed)
Pt tripped and fell and hit lip, denies any LOC

## 2014-09-27 NOTE — Progress Notes (Addendum)
Subjective:    Patient ID: Christine Henderson, female    DOB: June 19, 1930, 81 y.o.   MRN: [id number]  HPI   Pt in fell walking this morning. She tripped over uneven sidewalk(very uneven piece of concrete). Pt walks usually daily twice a day.  Pt did not have any loc.   Pt suffered laceration to facre. Also laceration of the upper buccal mucosa region.  No ha. No dizziness.No nausea, no vomiting, no gross motor or sensory function deficits.  Pt thinks her rt front tooth now misaligned. She feels like bite is abnormal.  Pt alert and oriented x 3.         Review of Systems  Constitutional: Negative for fever, chills and fatigue.  Respiratory: Negative for cough and wheezing.   Cardiovascular: Negative for chest pain and palpitations.  Gastrointestinal: Negative for abdominal pain.  Musculoskeletal: Negative for back pain.       No hip pain.  Skin:       Laceration rt side upper lip and buccal mucosa.  Neurological: Negative for dizziness, syncope, facial asymmetry, speech difficulty, weakness, light-headedness, numbness and headaches.  Psychiatric/Behavioral: Negative for behavioral problems and confusion.    Past Medical History  Diagnosis Date  . HYPERLIPIDEMIA 10/01/2006  . MIGRAINE HEADACHE 09/10/2006  . PHOTOPHOBIA 07/20/2009  . HYPERTENSION 10/06/2007  . ALLERGIC RHINITIS CAUSE UNSPECIFIED 02/02/2008  . Irritable bowel syndrome 09/10/2006  . VAGINITIS, ATROPHIC 10/01/2006  . SKIN TAG 11/09/2006  . OSTEOPENIA 09/10/2006  . Headache(784.0) 10/01/2006  . Palpitations 09/10/2006  . Diarrhea 06/26/2008  . Abdominal pain, unspecified site 06/26/2008  . Fibrocystic breast disease   . IBS (irritable bowel syndrome)   . MVP (mitral valve prolapse)   . Osteopenia 11/12/2012  . Insomnia 11/14/2012  . Annual physical exam 12/26/2012  . Medicare annual wellness visit, subsequent 12/26/2012    Sees Dr Elinor Parkinson of opthamology.  MGM 5/14     . Onychomycosis 07/09/2014    History    Social History  . Marital Status: Widowed    Spouse Name: N/A  . Number of Children: 4  . Years of Education: N/A   Occupational History  . retired    Social History Main Topics  . Smoking status: Never Smoker   . Smokeless tobacco: Never Used  . Alcohol Use: No  . Drug Use: No  . Sexual Activity: Yes    Birth Control/ Protection: None     Comment: regular exercise, lives at the [place], lives with husband   Other Topics Concern  . Not on file   Social History Narrative    Past Surgical History  Procedure Laterality Date  . Rotator cuff repair Right   . Tonsillectomy      Family History  Problem Relation Age of Onset  . Inflammatory bowel disease Sister   . Alzheimer's disease Sister   . Pancreatic cancer Maternal Grandfather   . Coronary artery disease Neg Hx   . Alzheimer's disease Mother   . Hyperlipidemia Son   . Obesity Son   . Hyperlipidemia Son     Allergies  Allergen Reactions  . Codeine     REACTION: Nausea  Vomiting    Current Outpatient Prescriptions on File Prior to Visit  Medication Sig Dispense Refill  . Ascorbic Acid (VITAMIN C) 1000 MG tablet Take 1,000 mg by mouth daily.      [name] Kitchen aspirin 81 MG tablet Take 81 mg by mouth daily.      . Calcium Carbonate-Vitamin  D (CALCIUM PLUS VITAMIN D PO) Take 1,200 tablets by mouth daily.     [name] Kitchen conjugated estrogens (PREMARIN) vaginal cream Place 0.5 g vaginally once a week.     . fish oil-omega-3 fatty acids 1000 MG capsule Take 2 g by mouth daily.      . Glucosamine-Chondroit-Vit C-Mn (GLUCOSAMINE 1500 COMPLEX) CAPS Take 1,500 capsules by mouth 2 (two) times daily.      [name] Kitchen loperamide (IMODIUM) 2 MG capsule Take 2 mg by mouth 4 (four) times daily as needed. For diarrhea    . losartan (COZAAR) 25 MG tablet Take 1 tablet (25 mg total) by mouth daily. 90 tablet 1  . Multiple Vitamin (MULTIVITAMIN) tablet Take 1 tablet by mouth daily.      . simvastatin (ZOCOR) 10 MG tablet Take 1 tablet (10 mg total) by  mouth at bedtime. 90 tablet 1  . atenolol (TENORMIN) 25 MG tablet Take 1/2 tablet daily. 45 tablet 1   No current facility-administered medications on file prior to visit.    BP 150/96 mmHg  Pulse 96  Temp(Src) 98.7 F (37.1 C) (Oral)  Ht 5' 3.5" (1.613 m)  Wt 124 lb 3.2 oz (56.337 kg)  BMI 21.65 kg/m2  SpO2 95%       Objective:   Physical Exam  General Mental Status- Alert. General Appearance- Not in acute distress.   Skin General: Color- Normal Color. Moisture- Normal Moisture. Rt side of lip. Near nares small laceration. Inside lip long laceration about 1.5 cm and deep.  Neck Carotid Arteries- Normal color. Moisture- Normal Moisture. No carotid bruits. No JVD.  Chest and Lung Exam Auscultation: Breath Sounds:-Normal. CTA.  Cardiovascular Auscultation:Rythm- Regular, Rate and rythm Murmurs & Other Heart Sounds:Auscultation of the heart reveals- No Murmurs.  Abdomen Inspection:-Inspeection Normal. Palpation/Percussion:Note:No mass. Palpation and Percussion of the abdomen reveal- Non Tender, Non Distended + BS, no rebound or guarding.  Mouth- upper teeth appear maligned.    Neurologic Cranial Nerve exam:- CN III-XII intact(No nystagmus), symmetric smile. Drift Test:- No drift. Romberg Exam:- Negative.  Heal to Toe Gait exam:-Normal. Finger to Nose:- Normal/Intact Strength:- 5/5 equal and symmetric strength both upper and lower extremities.  On palpation of pelvis no pain.No pain on palpation of hips.      Assessment & Plan:    Pt had fall today. No loc. Normal neurologic exam. No associated neurologic signs or symptoms.   The facial laceration appears like may be able to be dermabonded.  The laceration buccal mucosa is moderate large and deep. I want you to go to ED in our building for evaluation of this area and likely repair.  I would recommend you see denist due to your teeth malignment.  Follow up here post ED evaluation.  Did talk to charge  nurse and informed them you are on your way.

## 2014-09-27 NOTE — Patient Instructions (Addendum)
Pt had fall today. No loc. Normal neurologic exam. No associated neurologic signs or symptoms.   The facial laceration appears like may be able to be dermabonded.  The laceration buccal mucosa is moderate large and deep. I want you to go to ED in our building for evaluation of this area and likely repair.  I would recommend you see denist due to your teeth malignment.  Follow up here post ED evaluation.  Did talk to charge nurse and informed them you are on your way.

## 2014-10-16 ENCOUNTER — Ambulatory Visit: Payer: Medicare Other | Admitting: Medical

## 2014-12-26 LAB — HM MAMMOGRAPHY: HM MAMMO: NORMAL

## 2014-12-28 DIAGNOSIS — Z1231 Encounter for screening mammogram for malignant neoplasm of breast: Secondary | ICD-10-CM | POA: Diagnosis not present

## 2014-12-28 LAB — HM MAMMOGRAPHY

## 2015-01-02 ENCOUNTER — Encounter: Payer: Self-pay | Admitting: Family Medicine

## 2015-01-05 ENCOUNTER — Telehealth: Payer: Self-pay | Admitting: Behavioral Health

## 2015-01-05 ENCOUNTER — Encounter: Payer: Self-pay | Admitting: Behavioral Health

## 2015-01-05 NOTE — Telephone Encounter (Signed)
Pre-Visit Call completed with patient and chart updated.   Pre-Visit Info documented in Specialty Comments under [employer].    

## 2015-01-05 NOTE — Telephone Encounter (Signed)
Patient returning your call.

## 2015-01-05 NOTE — Addendum Note (Signed)
Addended by: Kathlen Brunswick on: 01/05/2015 05:28 PM   Modules accepted: Medications

## 2015-01-05 NOTE — Telephone Encounter (Signed)
Unable to reach patient at time of Pre-Visit Call.  Left message for patient to return call when available.    

## 2015-01-08 ENCOUNTER — Ambulatory Visit (INDEPENDENT_AMBULATORY_CARE_PROVIDER_SITE_OTHER): Payer: Medicare Other | Admitting: Family Medicine

## 2015-01-08 ENCOUNTER — Ambulatory Visit (HOSPITAL_BASED_OUTPATIENT_CLINIC_OR_DEPARTMENT_OTHER)
Admission: RE | Admit: 2015-01-08 | Discharge: 2015-01-08 | Disposition: A | Discharge status: Home / Self Care | Payer: Medicare Other | Source: Ambulatory Visit | Attending: Family Medicine | Admitting: Family Medicine

## 2015-01-08 ENCOUNTER — Encounter: Payer: Self-pay | Admitting: Family Medicine

## 2015-01-08 ENCOUNTER — Other Ambulatory Visit: Payer: Self-pay | Admitting: Family Medicine

## 2015-01-08 VITALS — BP 118/82 | HR 84 | Temp 97.6°F | Ht 64.0 in | Wt 118.5 lb

## 2015-01-08 DIAGNOSIS — I10 Essential (primary) hypertension: Secondary | ICD-10-CM

## 2015-01-08 DIAGNOSIS — Z780 Asymptomatic menopausal state: Secondary | ICD-10-CM | POA: Insufficient documentation

## 2015-01-08 DIAGNOSIS — M8580 Other specified disorders of bone density and structure, unspecified site: Secondary | ICD-10-CM

## 2015-01-08 DIAGNOSIS — M949 Disorder of cartilage, unspecified: Secondary | ICD-10-CM | POA: Diagnosis not present

## 2015-01-08 DIAGNOSIS — Z23 Encounter for immunization: Secondary | ICD-10-CM | POA: Diagnosis not present

## 2015-01-08 DIAGNOSIS — E785 Hyperlipidemia, unspecified: Secondary | ICD-10-CM

## 2015-01-08 DIAGNOSIS — M545 Low back pain: Secondary | ICD-10-CM

## 2015-01-08 DIAGNOSIS — M810 Age-related osteoporosis without current pathological fracture: Secondary | ICD-10-CM | POA: Diagnosis not present

## 2015-01-08 DIAGNOSIS — Z0000 Encounter for general adult medical examination without abnormal findings: Secondary | ICD-10-CM

## 2015-01-08 DIAGNOSIS — G4700 Insomnia, unspecified: Secondary | ICD-10-CM

## 2015-01-08 DIAGNOSIS — R739 Hyperglycemia, unspecified: Secondary | ICD-10-CM

## 2015-01-08 DIAGNOSIS — M899 Disorder of bone, unspecified: Secondary | ICD-10-CM

## 2015-01-08 LAB — CBC
HCT: 42 % (ref 36.0–46.0)
Hemoglobin: 13.7 g/dL (ref 12.0–15.0)
MCHC: 32.5 g/dL (ref 30.0–36.0)
MCV: 97.8 fl (ref 78.0–100.0)
Platelets: 265 10*3/uL (ref 150.0–400.0)
RBC: 4.29 Mil/uL (ref 3.87–5.11)
RDW: 14.5 % (ref 11.5–15.5)
WBC: 6.7 10*3/uL (ref 4.0–10.5)

## 2015-01-08 LAB — TSH: TSH: 2.26 u[IU]/mL (ref 0.35–4.50)

## 2015-01-08 LAB — COMPREHENSIVE METABOLIC PANEL
ALT: 16 U/L (ref 0–35)
AST: 24 U/L (ref 0–37)
Albumin: 4.1 g/dL (ref 3.5–5.2)
Alkaline Phosphatase: 44 U/L (ref 39–117)
BUN: 21 mg/dL (ref 6–23)
CO2: 30 mEq/L (ref 19–32)
Calcium: 9.7 mg/dL (ref 8.4–10.5)
Chloride: 102 mEq/L (ref 96–112)
Creatinine, Ser: 0.97 mg/dL (ref 0.40–1.20)
GFR: 58.32 mL/min — ABNORMAL LOW (ref >60.00)
GLUCOSE: 109 mg/dL — AB (ref 70–99)
POTASSIUM: 4 meq/L (ref 3.5–5.1)
SODIUM: 140 meq/L (ref 135–145)
Total Bilirubin: 1.3 mg/dL — ABNORMAL HIGH (ref 0.2–1.2)
Total Protein: 7.3 g/dL (ref 6.0–8.3)

## 2015-01-08 LAB — LIPID PANEL
CHOL/HDL RATIO: 3
Cholesterol: 180 mg/dL (ref 0–200)
HDL: 68.1 mg/dL (ref >39.00)
LDL Cholesterol: 94 mg/dL (ref 0–99)
NonHDL: 111.79
Triglycerides: 87 mg/dL (ref 0.0–149.0)
VLDL: 17.4 mg/dL (ref 0.0–40.0)

## 2015-01-08 LAB — VITAMIN D 25 HYDROXY (VIT D DEFICIENCY, FRACTURES): VITD: 62.33 ng/mL (ref 30.00–100.00)

## 2015-01-08 LAB — HEMOGLOBIN A1C: HEMOGLOBIN A1C: 6 % (ref 4.6–6.5)

## 2015-01-08 MED ORDER — ATENOLOL 25 MG PO TABS: ORAL_TABLET | ORAL | Status: DC

## 2015-01-08 MED ORDER — SIMVASTATIN 10 MG PO TABS: 10.0000 mg | ORAL_TABLET | Freq: Every day | ORAL | Status: DC

## 2015-01-08 MED ORDER — LOSARTAN POTASSIUM 25 MG PO TABS: 25.0000 mg | ORAL_TABLET | Freq: Every day | ORAL | Status: DC

## 2015-01-08 NOTE — Progress Notes (Signed)
Pre visit review using our clinic review tool, if applicable. No additional management support is needed unless otherwise documented below in the visit note. 

## 2015-01-08 NOTE — Progress Notes (Signed)
Christine Henderson [id number] 1930-07-31 01/08/2015      Patient Progress Note   Subjective  Chief Complaint  Chief Complaint  Patient presents with  . Medicare Wellness    HPI  81 year old female presents for medicare wellness appointment. She had fallen in July 2016 but has had no issues and is currently having her bite fixed with splinting by her dentist Dr. Henrene Pastor. She has not had any falls since and does not believe she needs physical therapy as it was an isolated event and was due to uneven concrete as opposed to true loss of balance. She also saw a physical therapist in September of 2015 for a pulled hip muscle and feels that she has a good foundation of exercises. Her vision has not changed and she is still seeing Dr. Ellie Lunch regularly. She also endorses lower back pain that is well controlled with over the counter ibuprofen. She says that she feels like her blood pressure gets low (slight light headedness) while walking about once every 4-6 weeks but has not felt any dizziness or weakness. She has been doing better in regards to the passing of her husband and has a lot o fsupport from her children. Patient denies shortness of breath, chest pain, GI issues, recent fevers or illnesses.    Past Medical History  Diagnosis Date  . HYPERLIPIDEMIA 10/01/2006  . MIGRAINE HEADACHE 09/10/2006  . PHOTOPHOBIA 07/20/2009  . HYPERTENSION 10/06/2007  . ALLERGIC RHINITIS CAUSE UNSPECIFIED 02/02/2008  . Irritable bowel syndrome 09/10/2006  . VAGINITIS, ATROPHIC 10/01/2006  . SKIN TAG 11/09/2006  . OSTEOPENIA 09/10/2006  . Headache(784.0) 10/01/2006  . Palpitations 09/10/2006  . Diarrhea 06/26/2008  . Abdominal pain, unspecified site 06/26/2008  . Fibrocystic breast disease   . IBS (irritable bowel syndrome)   . MVP (mitral valve prolapse)   . Osteopenia 11/12/2012  . Insomnia 11/14/2012  . Annual physical exam 12/26/2012  . Medicare annual wellness visit, subsequent 12/26/2012    Sees Dr Elinor Parkinson  of opthamology.  MGM 5/14     . Onychomycosis 07/09/2014    Past Surgical History  Procedure Laterality Date  . Rotator cuff repair Right   . Tonsillectomy    . Dental surgery  09/2014    Family History  Problem Relation Age of Onset  . Inflammatory bowel disease Sister   . Alzheimer's disease Sister   . Pancreatic cancer Maternal Grandfather   . Coronary artery disease Neg Hx   . Alzheimer's disease Mother   . Hyperlipidemia Son   . Obesity Son   . Hyperlipidemia Son   . Heart disease Paternal Uncle     Social History   Social History  . Marital Status: Widowed    Spouse Name: N/A  . Number of Children: 4  . Years of Education: N/A   Occupational History  . retired    Social History Main Topics  . Smoking status: Never Smoker   . Smokeless tobacco: Never Used  . Alcohol Use: No  . Drug Use: No  . Sexual Activity: Yes    Birth Control/ Protection: None     Comment: regular exercise, lives at the [facility], lives with husband   Other Topics Concern  . Not on file   Social History Narrative    Current Outpatient Prescriptions on File Prior to Visit  Medication Sig Dispense Refill  . Ascorbic Acid (VITAMIN C) 1000 MG tablet Take 1,000 mg by mouth daily.      [name] Kitchen aspirin 81 MG  tablet Take 81 mg by mouth daily.      . Calcium Carbonate-Vitamin D (CALCIUM PLUS VITAMIN D PO) Take 1,200 tablets by mouth daily.     [name] Kitchen conjugated estrogens (PREMARIN) vaginal cream Place 0.5 g vaginally once a week.     . fish oil-omega-3 fatty acids 1000 MG capsule Take 2 g by mouth daily.      . Glucosamine-Chondroit-Vit C-Mn (GLUCOSAMINE 1500 COMPLEX) CAPS Take 1,500 capsules by mouth 2 (two) times daily.      [name] Kitchen loperamide (IMODIUM) 2 MG capsule Take 2 mg by mouth 4 (four) times daily as needed. For diarrhea    . Multiple Vitamin (MULTIVITAMIN) tablet Take 1 tablet by mouth daily.       No current facility-administered medications on file prior to visit.    Allergies  Allergen  Reactions  . Codeine     REACTION: Nausea  Vomiting    Review of Systems   Constitutional: Negative.  HENT: Negative.  Eyes: Negative.  Respiratory: Negative.  Cardiovascular: Negative.  Gastrointestinal: Negative.  Genitourinary: Negative Musculoskeletal: Positive for lower back pain  Skin: Negative.  Neurological: Negative.  Endo/Heme/Allergies: Negative.  Psychiatric/Behavioral: Negative  Objective  BP 118/82 mmHg  Pulse 84  Temp(Src) 97.6 F (36.4 C) (Oral)  Ht 5\' 4"  (1.626 m)  Wt 118 lb 8 oz (53.751 kg)  BMI 20.33 kg/m2  SpO2 97%  Physical Exam  Constitutional: She is oriented to person, place, and time. She appears well-nourished. No distress.  Eyes: EOM are normal. Pupils are equal, round, and reactive to light.  Cardiovascular: Normal rate and regular rhythm.  Pulmonary/Chest: Breath sounds normal.  Abdominal: Soft. Bowel sounds are normal.  Lymphadenopathy:   She has no cervical adenopathy.  Neurological: She is alert and oriented to person, place, and time. She has normal reflexes. No cranial nerve deficit.    Assessment & Plan  Osteopenia -DEXA scan and Vitamin D level to monitor, currently stable  Hyperlipidemia -Lipid panel to monitor current status -Tolerating statin -Encouraged heart healthy diet, avoid trans fats, minimize simple carbs and saturated fats -Increase exercise as tolerated  Falls Risk -High risk due to fall in last year and osteopenia  -Falls prevention discussed, not interested in physical therapy -Falls education provided  Essential hypertension -Well controlled, no changes to meds.  -Encouraged heart healthy diet and exercise as tolerated.   Hearing Loss -Recommend full hearing exam for further evaluation  Arthritis -Continue with over the counter medications, monitor symptoms  Depression -Discussed current situation, low concern as of now  Preventative -Gastroenterologist no longer recommends  colonoscopy -Mammogram completed this month, will obtain results -Received flu shot today  Lab Work Ordered -CBC, CMP, Vitamin D, TSH, Lipid Panel -CBC, CMP, Lipid panel, TSH  Follow up in 6 months for routine care

## 2015-01-08 NOTE — Assessment & Plan Note (Signed)
Tolerating statin, encouraged heart healthy diet, avoid trans fats, minimize simple carbs and saturated fats. Increase exercise as tolerated 

## 2015-01-08 NOTE — Assessment & Plan Note (Signed)
Well controlled, no changes to meds. Encouraged heart healthy diet such as the DASH diet and exercise as tolerated.  °

## 2015-01-08 NOTE — Patient Instructions (Addendum)
[employer] does a good hearing aide  Preventive Care for Adults, Female A healthy lifestyle and preventive care can promote health and wellness. Preventive health guidelines for women include the following key practices.  A routine yearly physical is a good way to check with your health care provider about your health and preventive screening. It is a chance to share any concerns and updates on your health and to receive a thorough exam.  Visit your dentist for a routine exam and preventive care every 6 months. Brush your teeth twice a day and floss once a day. Good oral hygiene prevents tooth decay and gum disease.  The frequency of eye exams is based on your age, health, family medical history, use of contact lenses, and other factors. Follow your health care provider's recommendations for frequency of eye exams.  Eat a healthy diet. Foods like vegetables, fruits, whole grains, low-fat dairy products, and lean protein foods contain the nutrients you need without too many calories. Decrease your intake of foods high in solid fats, added sugars, and salt. Eat the right amount of calories for you.Get information about a proper diet from your health care provider, if necessary.  Regular physical exercise is one of the most important things you can do for your health. Most adults should get at least 150 minutes of moderate-intensity exercise (any activity that increases your heart rate and causes you to sweat) each week. In addition, most adults need muscle-strengthening exercises on 2 or more days a week.  Maintain a healthy weight. The body mass index (BMI) is a screening tool to identify possible weight problems. It provides an estimate of body fat based on height and weight. Your health care provider can find your BMI and can help you achieve or maintain a healthy weight.For adults 20 years and older:  A BMI below 18.5 is considered underweight.  A BMI of 18.5 to 24.9 is normal.  A BMI of 25 to  29.9 is considered overweight.  A BMI of 30 and above is considered obese.  Maintain normal blood lipids and cholesterol levels by exercising and minimizing your intake of saturated fat. Eat a balanced diet with plenty of fruit and vegetables. Blood tests for lipids and cholesterol should begin at age 46 and be repeated every 5 years. If your lipid or cholesterol levels are high, you are over 50, or you are at high risk for heart disease, you may need your cholesterol levels checked more frequently.Ongoing high lipid and cholesterol levels should be treated with medicines if diet and exercise are not working.  If you smoke, find out from your health care provider how to quit. If you do not use tobacco, do not start.  Lung cancer screening is recommended for adults aged 72-80 years who are at high risk for developing lung cancer because of a history of smoking. A yearly low-dose CT scan of the lungs is recommended for people who have at least a 30-pack-year history of smoking and are a current smoker or have quit within the past 15 years. A pack year of smoking is smoking an average of 1 pack of cigarettes a day for 1 year (for example: 1 pack a day for 30 years or 2 packs a day for 15 years). Yearly screening should continue until the smoker has stopped smoking for at least 15 years. Yearly screening should be stopped for people who develop a health problem that would prevent them from having lung cancer treatment.  If you are pregnant,  do not drink alcohol. If you are breastfeeding, be very cautious about drinking alcohol. If you are not pregnant and choose to drink alcohol, do not have more than 1 drink per day. One drink is considered to be 12 ounces (355 mL) of beer, 5 ounces (148 mL) of wine, or 1.5 ounces (44 mL) of liquor.  Avoid use of street drugs. Do not share needles with anyone. Ask for help if you need support or instructions about stopping the use of drugs.  High blood pressure causes  heart disease and increases the risk of stroke. Your blood pressure should be checked at least every 1 to 2 years. Ongoing high blood pressure should be treated with medicines if weight loss and exercise do not work.  If you are 55-79 years old, ask your health care provider if you should take aspirin to prevent strokes.  Diabetes screening is done by taking a blood sample to check your blood glucose level after you have not eaten for a certain period of time (fasting). If you are not overweight and you do not have risk factors for diabetes, you should be screened once every 3 years starting at age 45. If you are overweight or obese and you are 40-70 years of age, you should be screened for diabetes every year as part of your cardiovascular risk assessment.  Breast cancer screening is essential preventive care for women. You should practice "breast self-awareness." This means understanding the normal appearance and feel of your breasts and may include breast self-examination. Any changes detected, no matter how small, should be reported to a health care provider. Women in their 20s and 30s should have a clinical breast exam (CBE) by a health care provider as part of a regular health exam every 1 to 3 years. After age 40, women should have a CBE every year. Starting at age 40, women should consider having a mammogram (breast X-ray test) every year. Women who have a family history of breast cancer should talk to their health care provider about genetic screening. Women at a high risk of breast cancer should talk to their health care providers about having an MRI and a mammogram every year.  Breast cancer gene (BRCA)-related cancer risk assessment is recommended for women who have family members with BRCA-related cancers. BRCA-related cancers include breast, ovarian, tubal, and peritoneal cancers. Having family members with these cancers may be associated with an increased risk for harmful changes (mutations)  in the breast cancer genes BRCA1 and BRCA2. Results of the assessment will determine the need for genetic counseling and BRCA1 and BRCA2 testing.  Your health care provider may recommend that you be screened regularly for cancer of the pelvic organs (ovaries, uterus, and vagina). This screening involves a pelvic examination, including checking for microscopic changes to the surface of your cervix (Pap test). You may be encouraged to have this screening done every 3 years, beginning at age 21.  For women ages 30-65, health care providers may recommend pelvic exams and Pap testing every 3 years, or they may recommend the Pap and pelvic exam, combined with testing for human papilloma virus (HPV), every 5 years. Some types of HPV increase your risk of cervical cancer. Testing for HPV may also be done on women of any age with unclear Pap test results.  Other health care providers may not recommend any screening for nonpregnant women who are considered low risk for pelvic cancer and who do not have symptoms. Ask your health care provider if   a screening pelvic exam is right for you.  If you have had past treatment for cervical cancer or a condition that could lead to cancer, you need Pap tests and screening for cancer for at least 20 years after your treatment. If Pap tests have been discontinued, your risk factors (such as having a new sexual partner) need to be reassessed to determine if screening should resume. Some women have medical problems that increase the chance of getting cervical cancer. In these cases, your health care provider may recommend more frequent screening and Pap tests.  Colorectal cancer can be detected and often prevented. Most routine colorectal cancer screening begins at the age of 29 years and continues through age 16 years. However, your health care provider may recommend screening at an earlier age if you have risk factors for colon cancer. On a yearly basis, your health care provider  may provide home test kits to check for hidden blood in the stool. Use of a small camera at the end of a tube, to directly examine the colon (sigmoidoscopy or colonoscopy), can detect the earliest forms of colorectal cancer. Talk to your health care provider about this at age 48, when routine screening begins. Direct exam of the colon should be repeated every 5-10 years through age 23 years, unless early forms of precancerous polyps or small growths are found.  People who are at an increased risk for hepatitis B should be screened for this virus. You are considered at high risk for hepatitis B if:  You were born in a country where hepatitis B occurs often. Talk with your health care provider about which countries are considered high risk.  Your parents were born in a high-risk country and you have not received a shot to protect against hepatitis B (hepatitis B vaccine).  You have HIV or AIDS.  You use needles to inject street drugs.  You live with, or have sex with, someone who has hepatitis B.  You get hemodialysis treatment.  You take certain medicines for conditions like cancer, organ transplantation, and autoimmune conditions.  Hepatitis C blood testing is recommended for all people born from [birth year] through 1965 and any individual with known risks for hepatitis C.  Practice safe sex. Use condoms and avoid high-risk sexual practices to reduce the spread of sexually transmitted infections (STIs). STIs include gonorrhea, chlamydia, syphilis, trichomonas, herpes, HPV, and human immunodeficiency virus (HIV). Herpes, HIV, and HPV are viral illnesses that have no cure. They can result in disability, cancer, and death.  You should be screened for sexually transmitted illnesses (STIs) including gonorrhea and chlamydia if:  You are sexually active and are younger than 24 years.  You are older than 24 years and your health care provider tells you that you are at risk for this type of  infection.  Your sexual activity has changed since you were last screened and you are at an increased risk for chlamydia or gonorrhea. Ask your health care provider if you are at risk.  If you are at risk of being infected with HIV, it is recommended that you take a prescription medicine daily to prevent HIV infection. This is called preexposure prophylaxis (PrEP). You are considered at risk if:  You are sexually active and do not regularly use condoms or know the HIV status of your partner(s).  You take drugs by injection.  You are sexually active with a partner who has HIV.  Talk with your health care provider about whether you are at high  risk of being infected with HIV. If you choose to begin PrEP, you should first be tested for HIV. You should then be tested every 3 months for as long as you are taking PrEP.  Osteoporosis is a disease in which the bones lose minerals and strength with aging. This can result in serious bone fractures or breaks. The risk of osteoporosis can be identified using a bone density scan. Women ages 65 years and over and women at risk for fractures or osteoporosis should discuss screening with their health care providers. Ask your health care provider whether you should take a calcium supplement or vitamin D to reduce the rate of osteoporosis.  Menopause can be associated with physical symptoms and risks. Hormone replacement therapy is available to decrease symptoms and risks. You should talk to your health care provider about whether hormone replacement therapy is right for you.  Use sunscreen. Apply sunscreen liberally and repeatedly throughout the day. You should seek shade when your shadow is shorter than you. Protect yourself by wearing long sleeves, pants, a wide-brimmed hat, and sunglasses year round, whenever you are outdoors.  Once a month, do a whole body skin exam, using a mirror to look at the skin on your back. Tell your health care provider of new moles,  moles that have irregular borders, moles that are larger than a pencil eraser, or moles that have changed in shape or color.  Stay current with required vaccines (immunizations).  Influenza vaccine. All adults should be immunized every year.  Tetanus, diphtheria, and acellular pertussis (Td, Tdap) vaccine. Pregnant women should receive 1 dose of Tdap vaccine during each pregnancy. The dose should be obtained regardless of the length of time since the last dose. Immunization is preferred during the 27th-36th week of gestation. An adult who has not previously received Tdap or who does not know her vaccine status should receive 1 dose of Tdap. This initial dose should be followed by tetanus and diphtheria toxoids (Td) booster doses every 10 years. Adults with an unknown or incomplete history of completing a 3-dose immunization series with Td-containing vaccines should begin or complete a primary immunization series including a Tdap dose. Adults should receive a Td booster every 10 years.  Varicella vaccine. An adult without evidence of immunity to varicella should receive 2 doses or a second dose if she has previously received 1 dose. Pregnant females who do not have evidence of immunity should receive the first dose after pregnancy. This first dose should be obtained before leaving the health care facility. The second dose should be obtained 4-8 weeks after the first dose.  Human papillomavirus (HPV) vaccine. Females aged 13-26 years who have not received the vaccine previously should obtain the 3-dose series. The vaccine is not recommended for use in pregnant females. However, pregnancy testing is not needed before receiving a dose. If a female is found to be pregnant after receiving a dose, no treatment is needed. In that case, the remaining doses should be delayed until after the pregnancy. Immunization is recommended for any person with an immunocompromised condition through the age of 26 years if she  did not get any or all doses earlier. During the 3-dose series, the second dose should be obtained 4-8 weeks after the first dose. The third dose should be obtained 24 weeks after the first dose and 16 weeks after the second dose.  Zoster vaccine. One dose is recommended for adults aged 60 years or older unless certain conditions are present.  Measles,   mumps, and rubella (MMR) vaccine. Adults born before [birth year] generally are considered immune to measles and mumps. Adults born in [birth year] or later should have 1 or more doses of MMR vaccine unless there is a contraindication to the vaccine or there is laboratory evidence of immunity to each of the three diseases. A routine second dose of MMR vaccine should be obtained at least 28 days after the first dose for students attending postsecondary schools, health care workers, or international travelers. People who received inactivated measles vaccine or an unknown type of measles vaccine during 1963-1967 should receive 2 doses of MMR vaccine. People who received inactivated mumps vaccine or an unknown type of mumps vaccine before 1979 and are at high risk for mumps infection should consider immunization with 2 doses of MMR vaccine. For females of childbearing age, rubella immunity should be determined. If there is no evidence of immunity, females who are not pregnant should be vaccinated. If there is no evidence of immunity, females who are pregnant should delay immunization until after pregnancy. Unvaccinated health care workers born before 50 who lack laboratory evidence of measles, mumps, or rubella immunity or laboratory confirmation of disease should consider measles and mumps immunization with 2 doses of MMR vaccine or rubella immunization with 1 dose of MMR vaccine.  Pneumococcal 13-valent conjugate (PCV13) vaccine. When indicated, a person who is uncertain of his immunization history and has no record of immunization should receive the PCV13 vaccine. All adults  50 years of age and older should receive this vaccine. An adult aged 35 years or older who has certain medical conditions and has not been previously immunized should receive 1 dose of PCV13 vaccine. This PCV13 should be followed with a dose of pneumococcal polysaccharide (PPSV23) vaccine. Adults who are at high risk for pneumococcal disease should obtain the PPSV23 vaccine at least 8 weeks after the dose of PCV13 vaccine. Adults older than 80 years of age who have normal immune system function should obtain the PPSV23 vaccine dose at least 1 year after the dose of PCV13 vaccine.  Pneumococcal polysaccharide (PPSV23) vaccine. When PCV13 is also indicated, PCV13 should be obtained first. All adults aged 83 years and older should be immunized. An adult younger than age 70 years who has certain medical conditions should be immunized. Any person who resides in a nursing home or long-term care facility should be immunized. An adult smoker should be immunized. People with an immunocompromised condition and certain other conditions should receive both PCV13 and PPSV23 vaccines. People with human immunodeficiency virus (HIV) infection should be immunized as soon as possible after diagnosis. Immunization during chemotherapy or radiation therapy should be avoided. Routine use of PPSV23 vaccine is not recommended for American Indians, Golden Natives, or people younger than 65 years unless there are medical conditions that require PPSV23 vaccine. When indicated, people who have unknown immunization and have no record of immunization should receive PPSV23 vaccine. One-time revaccination 5 years after the first dose of PPSV23 is recommended for people aged 19-64 years who have chronic kidney failure, nephrotic syndrome, asplenia, or immunocompromised conditions. People who received 1-2 doses of PPSV23 before age 76 years should receive another dose of PPSV23 vaccine at age 58 years or later if at least 5 years have passed since  the previous dose. Doses of PPSV23 are not needed for people immunized with PPSV23 at or after age 43 years.  Meningococcal vaccine. Adults with asplenia or persistent complement component deficiencies should receive 2 doses of quadrivalent meningococcal conjugate (  MenACWY-D) vaccine. The doses should be obtained at least 2 months apart. Microbiologists working with certain meningococcal bacteria, military recruits, people at risk during an outbreak, and people who travel to or live in countries with a high rate of meningitis should be immunized. A first-year college student up through age 21 years who is living in a residence hall should receive a dose if she did not receive a dose on or after her 16th birthday. Adults who have certain high-risk conditions should receive one or more doses of vaccine.  Hepatitis A vaccine. Adults who wish to be protected from this disease, have certain high-risk conditions, work with hepatitis A-infected animals, work in hepatitis A research labs, or travel to or work in countries with a high rate of hepatitis A should be immunized. Adults who were previously unvaccinated and who anticipate close contact with an international adoptee during the first 60 days after arrival in the [place] from a country with a high rate of hepatitis A should be immunized.  Hepatitis B vaccine. Adults who wish to be protected from this disease, have certain high-risk conditions, may be exposed to blood or other infectious body fluids, are household contacts or sex partners of hepatitis B positive people, are clients or workers in certain care facilities, or travel to or work in countries with a high rate of hepatitis B should be immunized.  Haemophilus influenzae type b (Hib) vaccine. A previously unvaccinated person with asplenia or sickle cell disease or having a scheduled splenectomy should receive 1 dose of Hib vaccine. Regardless of previous immunization, a recipient of a  hematopoietic stem cell transplant should receive a 3-dose series 6-12 months after her successful transplant. Hib vaccine is not recommended for adults with HIV infection. Preventive Services / Frequency Ages 19 to 39 years  Blood pressure check.** / Every 3-5 years.  Lipid and cholesterol check.** / Every 5 years beginning at age 20.  Clinical breast exam.** / Every 3 years for women in their 20s and 30s.  BRCA-related cancer risk assessment.** / For women who have family members with a BRCA-related cancer (breast, ovarian, tubal, or peritoneal cancers).  Pap test.** / Every 2 years from ages 21 through 29. Every 3 years starting at age 30 through age 65 or 70 with a history of 3 consecutive normal Pap tests.  HPV screening.** / Every 3 years from ages 30 through ages 65 to 70 with a history of 3 consecutive normal Pap tests.  Hepatitis C blood test.** / For any individual with known risks for hepatitis C.  Skin self-exam. / Monthly.  Influenza vaccine. / Every year.  Tetanus, diphtheria, and acellular pertussis (Tdap, Td) vaccine.** / Consult your health care provider. Pregnant women should receive 1 dose of Tdap vaccine during each pregnancy. 1 dose of Td every 10 years.  Varicella vaccine.** / Consult your health care provider. Pregnant females who do not have evidence of immunity should receive the first dose after pregnancy.  HPV vaccine. / 3 doses over 6 months, if 26 and younger. The vaccine is not recommended for use in pregnant females. However, pregnancy testing is not needed before receiving a dose.  Measles, mumps, rubella (MMR) vaccine.** / You need at least 1 dose of MMR if you were born in 1957 or later. You may also need a 2nd dose. For females of childbearing age, rubella immunity should be determined. If there is no evidence of immunity, females who are not pregnant should be vaccinated. If there is   no evidence of immunity, females who are pregnant should delay  immunization until after pregnancy.  Pneumococcal 13-valent conjugate (PCV13) vaccine.** / Consult your health care provider.  Pneumococcal polysaccharide (PPSV23) vaccine.** / 1 to 2 doses if you smoke cigarettes or if you have certain conditions.  Meningococcal vaccine.** / 1 dose if you are age 19 to 21 years and a first-year college student living in a residence hall, or have one of several medical conditions, you need to get vaccinated against meningococcal disease. You may also need additional booster doses.  Hepatitis A vaccine.** / Consult your health care provider.  Hepatitis B vaccine.** / Consult your health care provider.  Haemophilus influenzae type b (Hib) vaccine.** / Consult your health care provider. Ages 40 to 64 years  Blood pressure check.** / Every year.  Lipid and cholesterol check.** / Every 5 years beginning at age 20 years.  Lung cancer screening. / Every year if you are aged 55-80 years and have a 30-pack-year history of smoking and currently smoke or have quit within the past 15 years. Yearly screening is stopped once you have quit smoking for at least 15 years or develop a health problem that would prevent you from having lung cancer treatment.  Clinical breast exam.** / Every year after age 40 years.  BRCA-related cancer risk assessment.** / For women who have family members with a BRCA-related cancer (breast, ovarian, tubal, or peritoneal cancers).  Mammogram.** / Every year beginning at age 40 years and continuing for as long as you are in good health. Consult with your health care provider.  Pap test.** / Every 3 years starting at age 30 years through age 65 or 70 years with a history of 3 consecutive normal Pap tests.  HPV screening.** / Every 3 years from ages 30 years through ages 65 to 70 years with a history of 3 consecutive normal Pap tests.  Fecal occult blood test (FOBT) of stool. / Every year beginning at age 50 years and continuing until age  75 years. You may not need to do this test if you get a colonoscopy every 10 years.  Flexible sigmoidoscopy or colonoscopy.** / Every 5 years for a flexible sigmoidoscopy or every 10 years for a colonoscopy beginning at age 50 years and continuing until age 75 years.  Hepatitis C blood test.** / For all people born from 1945 through 1965 and any individual with known risks for hepatitis C.  Skin self-exam. / Monthly.  Influenza vaccine. / Every year.  Tetanus, diphtheria, and acellular pertussis (Tdap/Td) vaccine.** / Consult your health care provider. Pregnant women should receive 1 dose of Tdap vaccine during each pregnancy. 1 dose of Td every 10 years.  Varicella vaccine.** / Consult your health care provider. Pregnant females who do not have evidence of immunity should receive the first dose after pregnancy.  Zoster vaccine.** / 1 dose for adults aged 60 years or older.  Measles, mumps, rubella (MMR) vaccine.** / You need at least 1 dose of MMR if you were born in 1957 or later. You may also need a second dose. For females of childbearing age, rubella immunity should be determined. If there is no evidence of immunity, females who are not pregnant should be vaccinated. If there is no evidence of immunity, females who are pregnant should delay immunization until after pregnancy.  Pneumococcal 13-valent conjugate (PCV13) vaccine.** / Consult your health care provider.  Pneumococcal polysaccharide (PPSV23) vaccine.** / 1 to 2 doses if you smoke cigarettes or if you   have certain conditions.  Meningococcal vaccine.** / Consult your health care provider.  Hepatitis A vaccine.** / Consult your health care provider.  Hepatitis B vaccine.** / Consult your health care provider.  Haemophilus influenzae type b (Hib) vaccine.** / Consult your health care provider. Ages 65 years and over  Blood pressure check.** / Every year.  Lipid and cholesterol check.** / Every 5 years beginning at age 20  years.  Lung cancer screening. / Every year if you are aged 55-80 years and have a 30-pack-year history of smoking and currently smoke or have quit within the past 15 years. Yearly screening is stopped once you have quit smoking for at least 15 years or develop a health problem that would prevent you from having lung cancer treatment.  Clinical breast exam.** / Every year after age 40 years.  BRCA-related cancer risk assessment.** / For women who have family members with a BRCA-related cancer (breast, ovarian, tubal, or peritoneal cancers).  Mammogram.** / Every year beginning at age 40 years and continuing for as long as you are in good health. Consult with your health care provider.  Pap test.** / Every 3 years starting at age 30 years through age 65 or 70 years with 3 consecutive normal Pap tests. Testing can be stopped between 65 and 70 years with 3 consecutive normal Pap tests and no abnormal Pap or HPV tests in the past 10 years.  HPV screening.** / Every 3 years from ages 30 years through ages 65 or 70 years with a history of 3 consecutive normal Pap tests. Testing can be stopped between 65 and 70 years with 3 consecutive normal Pap tests and no abnormal Pap or HPV tests in the past 10 years.  Fecal occult blood test (FOBT) of stool. / Every year beginning at age 50 years and continuing until age 75 years. You may not need to do this test if you get a colonoscopy every 10 years.  Flexible sigmoidoscopy or colonoscopy.** / Every 5 years for a flexible sigmoidoscopy or every 10 years for a colonoscopy beginning at age 50 years and continuing until age 75 years.  Hepatitis C blood test.** / For all people born from 1945 through 1965 and any individual with known risks for hepatitis C.  Osteoporosis screening.** / A one-time screening for women ages 65 years and over and women at risk for fractures or osteoporosis.  Skin self-exam. / Monthly.  Influenza vaccine. / Every year.  Tetanus,  diphtheria, and acellular pertussis (Tdap/Td) vaccine.** / 1 dose of Td every 10 years.  Varicella vaccine.** / Consult your health care provider.  Zoster vaccine.** / 1 dose for adults aged 60 years or older.  Pneumococcal 13-valent conjugate (PCV13) vaccine.** / Consult your health care provider.  Pneumococcal polysaccharide (PPSV23) vaccine.** / 1 dose for all adults aged 65 years and older.  Meningococcal vaccine.** / Consult your health care provider.  Hepatitis A vaccine.** / Consult your health care provider.  Hepatitis B vaccine.** / Consult your health care provider.  Haemophilus influenzae type b (Hib) vaccine.** / Consult your health care provider. ** Family history and personal history of risk and conditions may change your health care provider's recommendations.   This information is not intended to replace advice given to you by your health care provider. Make sure you discuss any questions you have with your health care provider.   Document Released: 04/15/2001 Document Revised: 03/10/2014 Document Reviewed: 07/15/2010 Elsevier Interactive Patient Education 2016 Elsevier Inc.  

## 2015-01-08 NOTE — Progress Notes (Signed)
Subjective:    Patient ID: Christine Henderson, female    DOB: 21-Jun-1930, 81 y.o.   MRN: [id number]  Chief Complaint  Patient presents with  . Medicare Wellness    HPI Patient is in today for annual wellness exam. She is generally doing well. Continues to struggle with some low back pain intermittently but not worsening. No falls, no incontinence, no radiculopathy. Is staying active and trying to eat a heart healthy diet. No difficulties at home or with ADLs. Denies CP/palp/SOB/HA/congestion/fevers/GI or GU c/o. Taking meds as prescribed  Past Medical History  Diagnosis Date  . HYPERLIPIDEMIA 10/01/2006  . MIGRAINE HEADACHE 09/10/2006  . PHOTOPHOBIA 07/20/2009  . HYPERTENSION 10/06/2007  . ALLERGIC RHINITIS CAUSE UNSPECIFIED 02/02/2008  . Irritable bowel syndrome 09/10/2006  . VAGINITIS, ATROPHIC 10/01/2006  . SKIN TAG 11/09/2006  . OSTEOPENIA 09/10/2006  . Headache(784.0) 10/01/2006  . Palpitations 09/10/2006  . Diarrhea 06/26/2008  . Abdominal pain, unspecified site 06/26/2008  . Fibrocystic breast disease   . IBS (irritable bowel syndrome)   . MVP (mitral valve prolapse)   . Osteopenia 11/12/2012  . Insomnia 11/14/2012  . Annual physical exam 12/26/2012  . Medicare annual wellness visit, subsequent 12/26/2012    Sees Dr Elinor Parkinson of opthamology.  MGM 5/14     . Onychomycosis 07/09/2014    Past Surgical History  Procedure Laterality Date  . Rotator cuff repair Right   . Tonsillectomy    . Dental surgery  09/2014    Family History  Problem Relation Age of Onset  . Inflammatory bowel disease Sister   . Alzheimer's disease Sister   . Pancreatic cancer Maternal Grandfather   . Coronary artery disease Neg Hx   . Alzheimer's disease Mother   . Hyperlipidemia Son   . Obesity Son   . Hyperlipidemia Son   . Heart disease Paternal Uncle     Social History   Social History  . Marital Status: Widowed    Spouse Name: N/A  . Number of Children: 4  . Years of Education: N/A    Occupational History  . retired    Social History Main Topics  . Smoking status: Never Smoker   . Smokeless tobacco: Never Used  . Alcohol Use: No  . Drug Use: No  . Sexual Activity: Yes    Birth Control/ Protection: None     Comment: regular exercise, lives at the [facility], lives with husband   Other Topics Concern  . Not on file   Social History Narrative    Outpatient Prescriptions Prior to Visit  Medication Sig Dispense Refill  . Ascorbic Acid (VITAMIN C) 1000 MG tablet Take 1,000 mg by mouth daily.      [name] Kitchen aspirin 81 MG tablet Take 81 mg by mouth daily.      . Calcium Carbonate-Vitamin D (CALCIUM PLUS VITAMIN D PO) Take 1,200 tablets by mouth daily.     [name] Kitchen conjugated estrogens (PREMARIN) vaginal cream Place 0.5 g vaginally once a week.     . fish oil-omega-3 fatty acids 1000 MG capsule Take 2 g by mouth daily.      . Glucosamine-Chondroit-Vit C-Mn (GLUCOSAMINE 1500 COMPLEX) CAPS Take 1,500 capsules by mouth 2 (two) times daily.      [name] Kitchen loperamide (IMODIUM) 2 MG capsule Take 2 mg by mouth 4 (four) times daily as needed. For diarrhea    . Multiple Vitamin (MULTIVITAMIN) tablet Take 1 tablet by mouth daily.      [name] Kitchen atenolol (TENORMIN) 25 MG  tablet Take 1/2 tablet daily. 45 tablet 1  . losartan (COZAAR) 25 MG tablet Take 1 tablet (25 mg total) by mouth daily. 90 tablet 1  . simvastatin (ZOCOR) 10 MG tablet Take 1 tablet (10 mg total) by mouth at bedtime. 90 tablet 1   No facility-administered medications prior to visit.    Allergies  Allergen Reactions  . Codeine     REACTION: Nausea  Vomiting  Allergies verified: UTD  Immunization Status: Flu vaccine-- 12/30/13 Tdap-- 09/27/14 PNA-- 06/27/13 Shingles-- 03/03/14; patient reported  A/P:  Changes to [place], [place] or Personal Hx: UTD Pap-- 11/06/09 w/ Dr. Stevie Kern at [facility] [place]; normal MMG-- 12/26/14 w/ [facility]; normal; patient reported Bone Density-- 11/23/12 w/ [facility] at [place]; AP  Spine L1-L4 (T-score 1.0), Femur Neck Left (T-score -1.3) & Femur Neck Right (T-score -1.2); osteopenia. CCS-- 06/20/03 w/ Dr. Delfin Edis at [facility]; normal colon; follow-up in 2015.  Care Teams Updated: Dr. Steffanie Dunn - Dentistry (not able to find the provider under the care team/communications tab)  ED/Hospital/Urgent Care Visits: Per the patient, no recent visits to the ED/Hospital or Urgent Care.  Review of Systems  Constitutional: Negative for fever, chills and malaise/fatigue.  HENT: Negative for congestion and hearing loss.   Eyes: Negative for discharge.  Respiratory: Negative for cough, sputum production and shortness of breath.   Cardiovascular: Negative for chest pain, palpitations and leg swelling.  Gastrointestinal: Negative for heartburn, nausea, vomiting, abdominal pain, diarrhea, constipation and blood in stool.  Genitourinary: Negative for dysuria, urgency, frequency and hematuria.  Musculoskeletal: Negative for myalgias and falls.  Skin: Negative for rash.  Neurological: Negative for dizziness, sensory change, loss of consciousness, weakness and headaches.  Endo/Heme/Allergies: Negative for environmental allergies. Does not bruise/bleed easily.  Psychiatric/Behavioral: Negative for depression and suicidal ideas. The patient is not nervous/anxious and does not have insomnia.        Objective:    Physical Exam  Constitutional: She is oriented to person, place, and time. She appears well-developed and well-nourished. No distress.  HENT:  Head: Normocephalic and atraumatic.  Eyes: Conjunctivae are normal.  Neck: Neck supple. No thyromegaly present.  Cardiovascular: Normal rate, regular rhythm and normal heart sounds.   No murmur heard. Pulmonary/Chest: Effort normal and breath sounds normal. No respiratory distress.  Abdominal: Soft. Bowel sounds are normal. She exhibits no distension and no mass. There is no tenderness.  Musculoskeletal: She exhibits  no edema.  Lymphadenopathy:    She has no cervical adenopathy.  Neurological: She is alert and oriented to person, place, and time.  Skin: Skin is warm and dry.  Psychiatric: She has a normal mood and affect. Her behavior is normal.    BP 118/82 mmHg  Pulse 84  Temp(Src) 97.6 F (36.4 C) (Oral)  Ht 5\' 4"  (1.626 m)  Wt 118 lb 8 oz (53.751 kg)  BMI 20.33 kg/m2  SpO2 97% Wt Readings from Last 3 Encounters:  01/08/15 118 lb 8 oz (53.751 kg)  09/27/14 120 lb (54.432 kg)  09/27/14 124 lb 3.2 oz (56.337 kg)     Lab Results  Component Value Date   WBC 6.5 07/03/2014   HGB 13.1 07/03/2014   HCT 38.9 07/03/2014   PLT 222.0 07/03/2014   GLUCOSE 94 07/03/2014   CHOL 191 12/30/2013   TRIG 106.0 12/30/2013   HDL 76.20 12/30/2013   LDLCALC 94 12/30/2013   ALT 19 07/03/2014   AST 25 07/03/2014   NA 137 07/03/2014  K 4.1 07/03/2014   CL 102 07/03/2014   CREATININE 1.03 07/03/2014   BUN 21 07/03/2014   CO2 30 07/03/2014   TSH 1.67 07/03/2014   HGBA1C 6.1 07/03/2014    Lab Results  Component Value Date   TSH 1.67 07/03/2014   Lab Results  Component Value Date   WBC 6.5 07/03/2014   HGB 13.1 07/03/2014   HCT 38.9 07/03/2014   MCV 94.9 07/03/2014   PLT 222.0 07/03/2014   Lab Results  Component Value Date   NA 137 07/03/2014   K 4.1 07/03/2014   CO2 30 07/03/2014   GLUCOSE 94 07/03/2014   BUN 21 07/03/2014   CREATININE 1.03 07/03/2014   BILITOT 1.2 07/03/2014   ALKPHOS 42 07/03/2014   AST 25 07/03/2014   ALT 19 07/03/2014   PROT 7.0 07/03/2014   ALBUMIN 3.9 07/03/2014   CALCIUM 9.8 07/03/2014   GFR 54.49* 07/03/2014   Lab Results  Component Value Date   CHOL 191 12/30/2013   Lab Results  Component Value Date   HDL 76.20 12/30/2013   Lab Results  Component Value Date   LDLCALC 94 12/30/2013   Lab Results  Component Value Date   TRIG 106.0 12/30/2013   Lab Results  Component Value Date   CHOLHDL 3 12/30/2013   Lab Results  Component Value Date     HGBA1C 6.1 07/03/2014       Assessment & Plan:   Problem List Items Addressed This Visit    Hyperlipidemia - Primary   Relevant Medications   simvastatin (ZOCOR) 10 MG tablet   losartan (COZAAR) 25 MG tablet   atenolol (TENORMIN) 25 MG tablet   Essential hypertension   Relevant Medications   simvastatin (ZOCOR) 10 MG tablet   losartan (COZAAR) 25 MG tablet   atenolol (TENORMIN) 25 MG tablet    Other Visit Diagnoses    Encounter for immunization           I am having Ms. Sharma maintain her aspirin, Calcium Carbonate-Vitamin D (CALCIUM PLUS VITAMIN D PO), multivitamin, fish oil-omega-3 fatty acids, GLUCOSAMINE 1500 COMPLEX, loperamide, vitamin C, conjugated estrogens, simvastatin, losartan, and atenolol.  Meds ordered this encounter  Medications  . simvastatin (ZOCOR) 10 MG tablet    Sig: Take 1 tablet (10 mg total) by mouth at bedtime.    Dispense:  90 tablet    Refill:  2    D/C PREVIOUS SCRIPTS FOR THIS MEDICATION  . losartan (COZAAR) 25 MG tablet    Sig: Take 1 tablet (25 mg total) by mouth daily.    Dispense:  90 tablet    Refill:  2    D/C PREVIOUS SCRIPTS FOR THIS MEDICATION  . atenolol (TENORMIN) 25 MG tablet    Sig: Take 1/2 tablet daily.    Dispense:  45 tablet    Refill:  2    D/C PREVIOUS SCRIPTS FOR THIS MEDICATION     Penni Homans, MD

## 2015-01-08 NOTE — Assessment & Plan Note (Signed)
Patient denies any difficulties at home. No trouble with ADLs, depression or falls. No recent changes to vision or hearing. Is UTD with immunizations. Is UTD with screening. Discussed Advanced Directives, patient agrees to bring [place] copies of documents if can. Encouraged heart healthy diet, exercise as tolerated and adequate sleep. Allergies verified: UTD  Immunization Status: Flu vaccine-- 12/30/13 Tdap-- 09/27/14 PNA-- 06/27/13 Shingles-- 03/03/14; patient reported  A/P:  Changes to [place], [place] or Personal Hx: UTD Pap-- 11/06/09 w/ Dr. Stevie Kern at [facility]; normal MMG-- 12/26/14 w/ [facility]; normal; patient reported Bone Density-- 11/23/12 w/ [facility] at [place]; AP Spine L1-L4 (T-score 1.0), Femur Neck Left (T-score -1.3) & Femur Neck Right (T-score -1.2); osteopenia. CCS-- 06/20/03 w/ Dr. Delfin Edis at [facility]; normal colon; follow-up in 2015.  Care Teams Updated: Dr. Steffanie Dunn - Dentistry (not able to find the provider under the care team/communications tab)  ED/Hospital/Urgent Care Visits: Per the patient, no recent visits to the ED/Hospital or Urgent Care.  See problem list for any risk factors See AVS for prventative health screen recommendations

## 2015-01-14 ENCOUNTER — Encounter: Payer: Self-pay | Admitting: Family Medicine

## 2015-01-14 NOTE — Assessment & Plan Note (Signed)
minimize simple carbs. Increase exercise as tolerated.  

## 2015-01-14 NOTE — Assessment & Plan Note (Signed)
Encouraged good sleep hygiene such as dark, quiet room. No blue/green glowing lights such as computer screens in bedroom. No alcohol or stimulants in evening. Cut down on caffeine as able. Regular exercise is helpful but not just prior to bed time.  

## 2015-01-14 NOTE — Assessment & Plan Note (Signed)
Encouraged moist heat and gentle stretching as tolerated. May try NSAIDs and prescription meds as directed and report if symptoms worsen or seek immediate care 

## 2015-01-14 NOTE — Assessment & Plan Note (Signed)
Encouraged to get adequate exercise, calcium and vitamin d intake 

## 2015-03-06 DIAGNOSIS — Z961 Presence of intraocular lens: Secondary | ICD-10-CM | POA: Diagnosis not present

## 2015-03-06 DIAGNOSIS — H5203 Hypermetropia, bilateral: Secondary | ICD-10-CM | POA: Diagnosis not present

## 2015-03-06 DIAGNOSIS — D3131 Benign neoplasm of right choroid: Secondary | ICD-10-CM | POA: Diagnosis not present

## 2015-07-09 ENCOUNTER — Ambulatory Visit: Payer: Medicare Other | Admitting: Family Medicine

## 2015-07-10 ENCOUNTER — Encounter: Payer: Self-pay | Admitting: Family Medicine

## 2015-07-10 ENCOUNTER — Ambulatory Visit (INDEPENDENT_AMBULATORY_CARE_PROVIDER_SITE_OTHER): Payer: Medicare Other | Admitting: Family Medicine

## 2015-07-10 VITALS — BP 122/82 | HR 68 | Temp 98.3°F | Ht 64.0 in | Wt 124.5 lb

## 2015-07-10 DIAGNOSIS — K580 Irritable bowel syndrome with diarrhea: Secondary | ICD-10-CM

## 2015-07-10 DIAGNOSIS — I10 Essential (primary) hypertension: Secondary | ICD-10-CM

## 2015-07-10 DIAGNOSIS — R739 Hyperglycemia, unspecified: Secondary | ICD-10-CM

## 2015-07-10 DIAGNOSIS — G43409 Hemiplegic migraine, not intractable, without status migrainosus: Secondary | ICD-10-CM

## 2015-07-10 DIAGNOSIS — M899 Disorder of bone, unspecified: Secondary | ICD-10-CM

## 2015-07-10 DIAGNOSIS — M949 Disorder of cartilage, unspecified: Secondary | ICD-10-CM

## 2015-07-10 DIAGNOSIS — E785 Hyperlipidemia, unspecified: Secondary | ICD-10-CM | POA: Diagnosis not present

## 2015-07-10 NOTE — Progress Notes (Signed)
Subjective:    Patient ID: Christine Henderson, female    DOB: 11/25/1930, 82 y.o.   MRN: [id number]  Chief Complaint  Patient presents with  . Follow-up    6 month    HPI Patient is in today for follow up. Patient presents today with some concerns with loose stools, can not figure out anything that may be the cause of stools being loose, patient currently is taking a probiotic.  No recent febrile illness.   Past Medical History  Diagnosis Date  . HYPERLIPIDEMIA 10/01/2006  . MIGRAINE HEADACHE 09/10/2006  . PHOTOPHOBIA 07/20/2009  . HYPERTENSION 10/06/2007  . ALLERGIC RHINITIS CAUSE UNSPECIFIED 02/02/2008  . Irritable bowel syndrome 09/10/2006  . VAGINITIS, ATROPHIC 10/01/2006  . SKIN TAG 11/09/2006  . OSTEOPENIA 09/10/2006  . Headache(784.0) 10/01/2006  . Palpitations 09/10/2006  . Diarrhea 06/26/2008  . Abdominal pain, unspecified site 06/26/2008  . Fibrocystic breast disease   . IBS (irritable bowel syndrome)   . MVP (mitral valve prolapse)   . Osteopenia 11/12/2012  . Insomnia 11/14/2012  . Annual physical exam 12/26/2012  . Medicare annual wellness visit, subsequent 12/26/2012    Sees Dr Elinor Parkinson of opthamology.  MGM 5/14     . Onychomycosis 07/09/2014    Past Surgical History  Procedure Laterality Date  . Rotator cuff repair Right   . Tonsillectomy    . Dental surgery  09/2014    Family History  Problem Relation Age of Onset  . Inflammatory bowel disease Sister   . Alzheimer's disease Sister   . Pancreatic cancer Maternal Grandfather   . Coronary artery disease Neg Hx   . Alzheimer's disease Mother   . Hyperlipidemia Son   . Obesity Son   . Hyperlipidemia Son   . Heart disease Paternal Uncle     Social History   Social History  . Marital Status: Widowed    Spouse Name: N/A  . Number of Children: 4  . Years of Education: N/A   Occupational History  . retired    Social History Main Topics  . Smoking status: Never Smoker   . Smokeless tobacco: Never Used    . Alcohol Use: No  . Drug Use: No  . Sexual Activity: Yes    Birth Control/ Protection: None     Comment: regular exercise, lives at the [place], widowed in 2014   Other Topics Concern  . Not on file   Social History Narrative    Outpatient Prescriptions Prior to Visit  Medication Sig Dispense Refill  . Ascorbic Acid (VITAMIN C) 1000 MG tablet Take 1,000 mg by mouth daily.      [name] Kitchen aspirin 81 MG tablet Take 81 mg by mouth daily.      [name] Kitchen atenolol (TENORMIN) 25 MG tablet Take 1/2 tablet daily. 45 tablet 2  . Calcium Carbonate-Vitamin D (CALCIUM PLUS VITAMIN D PO) Take 1,200 tablets by mouth daily.     [name] Kitchen conjugated estrogens (PREMARIN) vaginal cream Place 0.5 g vaginally once a week.     . fish oil-omega-3 fatty acids 1000 MG capsule Take 2 g by mouth daily.      . Glucosamine-Chondroit-Vit C-Mn (GLUCOSAMINE 1500 COMPLEX) CAPS Take 1,500 capsules by mouth 2 (two) times daily.      [name] Kitchen loperamide (IMODIUM) 2 MG capsule Take 2 mg by mouth 4 (four) times daily as needed. For diarrhea    . losartan (COZAAR) 25 MG tablet Take 1 tablet (25 mg total) by mouth daily. 90 tablet 2  .  Multiple Vitamin (MULTIVITAMIN) tablet Take 1 tablet by mouth daily.      . simvastatin (ZOCOR) 10 MG tablet Take 1 tablet (10 mg total) by mouth at bedtime. 90 tablet 2   No facility-administered medications prior to visit.    Allergies  Allergen Reactions  . Codeine     REACTION: Nausea  Vomiting    Review of Systems  Constitutional: Negative for fever and malaise/fatigue.  HENT: Negative for congestion.   Eyes: Negative for blurred vision.  Respiratory: Negative for shortness of breath.   Cardiovascular: Negative for chest pain, palpitations and leg swelling.  Gastrointestinal: Negative for nausea, abdominal pain and blood in stool.  Genitourinary: Negative for dysuria and frequency.  Musculoskeletal: Negative for falls.  Skin: Negative for rash.  Neurological: Negative for dizziness, loss of  consciousness and headaches.  Endo/Heme/Allergies: Negative for environmental allergies.  Psychiatric/Behavioral: Negative for depression. The patient is not nervous/anxious.        Objective:    Physical Exam  Constitutional: She is oriented to person, place, and time. She appears well-developed and well-nourished. No distress.  HENT:  Head: Normocephalic and atraumatic.  Eyes: Conjunctivae are normal.  Neck: Neck supple. No thyromegaly present.  Cardiovascular: Normal rate, regular rhythm and normal heart sounds.   No murmur heard. Pulmonary/Chest: Effort normal and breath sounds normal. No respiratory distress.  Abdominal: Soft. Bowel sounds are normal. She exhibits no distension and no mass. There is no tenderness.  Musculoskeletal: She exhibits no edema.  Lymphadenopathy:    She has no cervical adenopathy.  Neurological: She is alert and oriented to person, place, and time.  Skin: Skin is warm and dry.  Psychiatric: She has a normal mood and affect. Her behavior is normal.    BP 122/82 mmHg  Pulse 68  Temp(Src) 98.3 F (36.8 C) (Oral)  Ht 5\' 4"  (1.626 m)  Wt 124 lb 8 oz (56.473 kg)  BMI 21.36 kg/m2  SpO2 96% Wt Readings from Last 3 Encounters:  07/10/15 124 lb 8 oz (56.473 kg)  01/08/15 118 lb 8 oz (53.751 kg)  09/27/14 120 lb (54.432 kg)     Lab Results  Component Value Date   WBC 6.7 01/08/2015   HGB 13.7 01/08/2015   HCT 42.0 01/08/2015   PLT 265.0 01/08/2015   GLUCOSE 109* 01/08/2015   CHOL 180 01/08/2015   TRIG 87.0 01/08/2015   HDL 68.10 01/08/2015   LDLCALC 94 01/08/2015   ALT 16 01/08/2015   AST 24 01/08/2015   NA 140 01/08/2015   K 4.0 01/08/2015   CL 102 01/08/2015   CREATININE 0.97 01/08/2015   BUN 21 01/08/2015   CO2 30 01/08/2015   TSH 2.26 01/08/2015   HGBA1C 6.0 01/08/2015    Lab Results  Component Value Date   TSH 2.26 01/08/2015   Lab Results  Component Value Date   WBC 6.7 01/08/2015   HGB 13.7 01/08/2015   HCT 42.0  01/08/2015   MCV 97.8 01/08/2015   PLT 265.0 01/08/2015   Lab Results  Component Value Date   NA 140 01/08/2015   K 4.0 01/08/2015   CO2 30 01/08/2015   GLUCOSE 109* 01/08/2015   BUN 21 01/08/2015   CREATININE 0.97 01/08/2015   BILITOT 1.3* 01/08/2015   ALKPHOS 44 01/08/2015   AST 24 01/08/2015   ALT 16 01/08/2015   PROT 7.3 01/08/2015   ALBUMIN 4.1 01/08/2015   CALCIUM 9.7 01/08/2015   GFR 58.32* 01/08/2015   Lab Results  Component  Value Date   CHOL 180 01/08/2015   Lab Results  Component Value Date   HDL 68.10 01/08/2015   Lab Results  Component Value Date   LDLCALC 94 01/08/2015   Lab Results  Component Value Date   TRIG 87.0 01/08/2015   Lab Results  Component Value Date   CHOLHDL 3 01/08/2015   Lab Results  Component Value Date   HGBA1C 6.0 01/08/2015       Assessment & Plan:   Problem List Items Addressed This Visit    Migraine headache    Encouraged increased hydration, 64 ounces of clear fluids daily. Minimize alcohol and caffeine. Eat small frequent meals with lean proteins and complex carbs. Avoid high and low blood sugars. Get adequate sleep, 7-8 hours a night. Needs exercise daily preferably in the morning.      IRRITABLE BOWEL SYNDROME    Recent increase in constipation. Avoid offending foods, start probiotics. Do not eat large meals in late evening and consider raising head of bed.       Hyperlipidemia   Relevant Orders   Hemoglobin A1c   CBC   TSH   Comprehensive metabolic panel   Lipid panel   VITAMIN D 25 Hydroxy (Vit-D Deficiency, Fractures)   Hyperglycemia - Primary   Relevant Orders   Hemoglobin A1c   CBC   TSH   Comprehensive metabolic panel   Lipid panel   VITAMIN D 25 Hydroxy (Vit-D Deficiency, Fractures)   Essential hypertension    Well controlled, no changes to meds. Encouraged heart healthy diet such as the DASH diet and exercise as tolerated.       Disorder of bone and cartilage   Relevant Orders   Hemoglobin  A1c   CBC   TSH   Comprehensive metabolic panel   Lipid panel   VITAMIN D 25 Hydroxy (Vit-D Deficiency, Fractures)      I am having Ms. Brenn maintain her aspirin, Calcium Carbonate-Vitamin D (CALCIUM PLUS VITAMIN D PO), multivitamin, fish oil-omega-3 fatty acids, GLUCOSAMINE 1500 COMPLEX, loperamide, vitamin C, conjugated estrogens, simvastatin, losartan, and atenolol.  No orders of the defined types were placed in this encounter.     Penni Homans, MD

## 2015-07-10 NOTE — Assessment & Plan Note (Signed)
Recent increase in constipation. Avoid offending foods, start probiotics. Do not eat large meals in late evening and consider raising head of bed.

## 2015-07-10 NOTE — Progress Notes (Signed)
Pre visit review using our clinic review tool, if applicable. No additional management support is needed unless otherwise documented below in the visit note. 

## 2015-07-10 NOTE — Assessment & Plan Note (Signed)
Well controlled, no changes to meds. Encouraged heart healthy diet such as the DASH diet and exercise as tolerated.  °

## 2015-07-10 NOTE — Assessment & Plan Note (Signed)
Encouraged increased hydration, 64 ounces of clear fluids daily. Minimize alcohol and caffeine. Eat small frequent meals with lean proteins and complex carbs. Avoid high and low blood sugars. Get adequate sleep, 7-8 hours a night. Needs exercise daily preferably in the morning.  

## 2015-07-10 NOTE — Patient Instructions (Signed)

## 2015-07-11 LAB — LIPID PANEL
CHOL/HDL RATIO: 3
Cholesterol: 160 mg/dL (ref 0–200)
HDL: 63.8 mg/dL (ref >39.00)
LDL Cholesterol: 70 mg/dL (ref 0–99)
NONHDL: 95.76
Triglycerides: 128 mg/dL (ref 0.0–149.0)
VLDL: 25.6 mg/dL (ref 0.0–40.0)

## 2015-07-11 LAB — TSH: TSH: 2.32 u[IU]/mL (ref 0.35–4.50)

## 2015-07-11 LAB — VITAMIN D 25 HYDROXY (VIT D DEFICIENCY, FRACTURES): VITD: 55.58 ng/mL (ref 30.00–100.00)

## 2015-07-11 LAB — COMPREHENSIVE METABOLIC PANEL
ALT: 13 U/L (ref 0–35)
AST: 24 U/L (ref 0–37)
Albumin: 4.4 g/dL (ref 3.5–5.2)
Alkaline Phosphatase: 40 U/L (ref 39–117)
BILIRUBIN TOTAL: 0.8 mg/dL (ref 0.2–1.2)
BUN: 27 mg/dL — AB (ref 6–23)
CO2: 27 mEq/L (ref 19–32)
CREATININE: 1.2 mg/dL (ref 0.40–1.20)
Calcium: 9.9 mg/dL (ref 8.4–10.5)
Chloride: 104 mEq/L (ref 96–112)
GFR: 45.57 mL/min — AB (ref >60.00)
GLUCOSE: 101 mg/dL — AB (ref 70–99)
Potassium: 4.7 mEq/L (ref 3.5–5.1)
Sodium: 140 mEq/L (ref 135–145)
TOTAL PROTEIN: 7.3 g/dL (ref 6.0–8.3)

## 2015-07-11 LAB — CBC
HEMATOCRIT: 39.9 % (ref 36.0–46.0)
Hemoglobin: 13.1 g/dL (ref 12.0–15.0)
MCHC: 33 g/dL (ref 30.0–36.0)
MCV: 96.8 fl (ref 78.0–100.0)
Platelets: 256 10*3/uL (ref 150.0–400.0)
RBC: 4.12 Mil/uL (ref 3.87–5.11)
RDW: 14.6 % (ref 11.5–15.5)
WBC: 7.4 10*3/uL (ref 4.0–10.5)

## 2015-07-11 LAB — HEMOGLOBIN A1C: HEMOGLOBIN A1C: 6.3 % (ref 4.6–6.5)

## 2015-10-26 ENCOUNTER — Other Ambulatory Visit: Payer: Self-pay | Admitting: Family Medicine

## 2015-10-26 DIAGNOSIS — E785 Hyperlipidemia, unspecified: Secondary | ICD-10-CM

## 2015-10-26 DIAGNOSIS — I10 Essential (primary) hypertension: Secondary | ICD-10-CM

## 2015-12-20 ENCOUNTER — Telehealth: Payer: Self-pay | Admitting: Family Medicine

## 2015-12-20 DIAGNOSIS — I10 Essential (primary) hypertension: Secondary | ICD-10-CM

## 2015-12-20 MED ORDER — ATENOLOL 25 MG PO TABS: 12.5000 mg | ORAL_TABLET | Freq: Every day | ORAL | 1 refills | Status: DC

## 2015-12-20 NOTE — Telephone Encounter (Signed)
Medication has been sent. I called the number provided and no answer unable to leave voicemail.  PC

## 2015-12-20 NOTE — Telephone Encounter (Signed)
Pt called in, she says that her Rx for atenolol was sent to mail order, however it is out of stock. Pt would like to know if provider could sent a Rx to her local neighborhood [place] [address]. Pt would like to be advised once medication has been sent.

## 2016-01-07 DIAGNOSIS — Z1231 Encounter for screening mammogram for malignant neoplasm of breast: Secondary | ICD-10-CM | POA: Diagnosis not present

## 2016-01-07 LAB — HM MAMMOGRAPHY

## 2016-01-10 ENCOUNTER — Encounter: Payer: Self-pay | Admitting: Family Medicine

## 2016-01-10 ENCOUNTER — Ambulatory Visit (INDEPENDENT_AMBULATORY_CARE_PROVIDER_SITE_OTHER): Payer: Medicare Other | Admitting: Family Medicine

## 2016-01-10 ENCOUNTER — Ambulatory Visit (HOSPITAL_BASED_OUTPATIENT_CLINIC_OR_DEPARTMENT_OTHER)
Admission: RE | Admit: 2016-01-10 | Discharge: 2016-01-10 | Disposition: A | Discharge status: Home / Self Care | Payer: Medicare Other | Source: Ambulatory Visit | Attending: Family Medicine | Admitting: Family Medicine

## 2016-01-10 VITALS — BP 120/76 | HR 66 | Temp 98.0°F | Ht 64.0 in | Wt 124.2 lb

## 2016-01-10 DIAGNOSIS — R739 Hyperglycemia, unspecified: Secondary | ICD-10-CM

## 2016-01-10 DIAGNOSIS — I10 Essential (primary) hypertension: Secondary | ICD-10-CM

## 2016-01-10 DIAGNOSIS — Z0000 Encounter for general adult medical examination without abnormal findings: Secondary | ICD-10-CM

## 2016-01-10 DIAGNOSIS — E782 Mixed hyperlipidemia: Secondary | ICD-10-CM | POA: Diagnosis not present

## 2016-01-10 DIAGNOSIS — G609 Hereditary and idiopathic neuropathy, unspecified: Secondary | ICD-10-CM | POA: Diagnosis not present

## 2016-01-10 DIAGNOSIS — S92355A Nondisplaced fracture of fifth metatarsal bone, left foot, initial encounter for closed fracture: Secondary | ICD-10-CM | POA: Insufficient documentation

## 2016-01-10 DIAGNOSIS — X58XXXA Exposure to other specified factors, initial encounter: Secondary | ICD-10-CM | POA: Insufficient documentation

## 2016-01-10 DIAGNOSIS — M79672 Pain in left foot: Secondary | ICD-10-CM

## 2016-01-10 DIAGNOSIS — M84375A Stress fracture, left foot, initial encounter for fracture: Secondary | ICD-10-CM

## 2016-01-10 DIAGNOSIS — S92352A Displaced fracture of fifth metatarsal bone, left foot, initial encounter for closed fracture: Secondary | ICD-10-CM | POA: Diagnosis not present

## 2016-01-10 HISTORY — DX: Hereditary and idiopathic neuropathy, unspecified: G60.9

## 2016-01-10 LAB — CBC
HCT: 39.2 % (ref 36.0–46.0)
HEMOGLOBIN: 13.1 g/dL (ref 12.0–15.0)
MCHC: 33.3 g/dL (ref 30.0–36.0)
MCV: 95.9 fl (ref 78.0–100.0)
Platelets: 286 10*3/uL (ref 150.0–400.0)
RBC: 4.09 Mil/uL (ref 3.87–5.11)
RDW: 14.7 % (ref 11.5–15.5)
WBC: 7.5 10*3/uL (ref 4.0–10.5)

## 2016-01-10 LAB — COMPREHENSIVE METABOLIC PANEL
ALT: 20 U/L (ref 0–35)
AST: 28 U/L (ref 0–37)
Albumin: 4.3 g/dL (ref 3.5–5.2)
Alkaline Phosphatase: 43 U/L (ref 39–117)
BILIRUBIN TOTAL: 1.2 mg/dL (ref 0.2–1.2)
BUN: 26 mg/dL — ABNORMAL HIGH (ref 6–23)
CHLORIDE: 103 meq/L (ref 96–112)
CO2: 30 meq/L (ref 19–32)
Calcium: 9.9 mg/dL (ref 8.4–10.5)
Creatinine, Ser: 1.03 mg/dL (ref 0.40–1.20)
GFR: 54.28 mL/min — AB (ref >60.00)
GLUCOSE: 111 mg/dL — AB (ref 70–99)
POTASSIUM: 4.2 meq/L (ref 3.5–5.1)
Sodium: 139 mEq/L (ref 135–145)
Total Protein: 7.5 g/dL (ref 6.0–8.3)

## 2016-01-10 LAB — HEMOGLOBIN A1C: HEMOGLOBIN A1C: 6 % (ref 4.6–6.5)

## 2016-01-10 LAB — LIPID PANEL
CHOLESTEROL: 174 mg/dL (ref 0–200)
HDL: 68.9 mg/dL (ref >39.00)
LDL Cholesterol: 90 mg/dL (ref 0–99)
NonHDL: 105.48
Total CHOL/HDL Ratio: 3
Triglycerides: 79 mg/dL (ref 0.0–149.0)
VLDL: 15.8 mg/dL (ref 0.0–40.0)

## 2016-01-10 LAB — FOLATE

## 2016-01-10 LAB — TSH: TSH: 2.56 u[IU]/mL (ref 0.35–4.50)

## 2016-01-10 LAB — VITAMIN B12: Vitamin B-12: >1500 pg/mL — ABNORMAL HIGH (ref 211–911)

## 2016-01-10 NOTE — Progress Notes (Signed)
Pre visit review using our clinic review tool, if applicable. No additional management support is needed unless otherwise documented below in the visit note. 

## 2016-01-10 NOTE — Patient Instructions (Addendum)
60-64 ounces of clear fluids daily  3-5 drops of hydrogen peroxide in both ears at bed for 3-5 days then come back in to get ears flushed  Preventive Care for Adults, Female A healthy lifestyle and preventive care can promote health and wellness. Preventive health guidelines for women include the following key practices.  A routine yearly physical is a good way to check with your health care provider about your health and preventive screening. It is a chance to share any concerns and updates on your health and to receive a thorough exam.  Visit your dentist for a routine exam and preventive care every 6 months. Brush your teeth twice a day and floss once a day. Good oral hygiene prevents tooth decay and gum disease.  The frequency of eye exams is based on your age, health, family medical history, use of contact lenses, and other factors. Follow your health care provider's recommendations for frequency of eye exams.  Eat a healthy diet. Foods like vegetables, fruits, whole grains, low-fat dairy products, and lean protein foods contain the nutrients you need without too many calories. Decrease your intake of foods high in solid fats, added sugars, and salt. Eat the right amount of calories for you.Get information about a proper diet from your health care provider, if necessary.  Regular physical exercise is one of the most important things you can do for your health. Most adults should get at least 150 minutes of moderate-intensity exercise (any activity that increases your heart rate and causes you to sweat) each week. In addition, most adults need muscle-strengthening exercises on 2 or more days a week.  Maintain a healthy weight. The body mass index (BMI) is a screening tool to identify possible weight problems. It provides an estimate of body fat based on height and weight. Your health care provider can find your BMI and can help you achieve or maintain a healthy weight.For adults 20 years and  older:  A BMI below 18.5 is considered underweight.  A BMI of 18.5 to 24.9 is normal.  A BMI of 25 to 29.9 is considered overweight.  A BMI of 30 and above is considered obese.  Maintain normal blood lipids and cholesterol levels by exercising and minimizing your intake of saturated fat. Eat a balanced diet with plenty of fruit and vegetables. Blood tests for lipids and cholesterol should begin at age 47 and be repeated every 5 years. If your lipid or cholesterol levels are high, you are over 50, or you are at high risk for heart disease, you may need your cholesterol levels checked more frequently.Ongoing high lipid and cholesterol levels should be treated with medicines if diet and exercise are not working.  If you smoke, find out from your health care provider how to quit. If you do not use tobacco, do not start.  Lung cancer screening is recommended for adults aged 12-80 years who are at high risk for developing lung cancer because of a history of smoking. A yearly low-dose CT scan of the lungs is recommended for people who have at least a 30-pack-year history of smoking and are a current smoker or have quit within the past 15 years. A pack year of smoking is smoking an average of 1 pack of cigarettes a day for 1 year (for example: 1 pack a day for 30 years or 2 packs a day for 15 years). Yearly screening should continue until the smoker has stopped smoking for at least 15 years. Yearly screening should be  stopped for people who develop a health problem that would prevent them from having lung cancer treatment.  If you are pregnant, do not drink alcohol. If you are breastfeeding, be very cautious about drinking alcohol. If you are not pregnant and choose to drink alcohol, do not have more than 1 drink per day. One drink is considered to be 12 ounces (355 mL) of beer, 5 ounces (148 mL) of wine, or 1.5 ounces (44 mL) of liquor.  Avoid use of street drugs. Do not share needles with anyone. Ask  for help if you need support or instructions about stopping the use of drugs.  High blood pressure causes heart disease and increases the risk of stroke. Your blood pressure should be checked at least every 1 to 2 years. Ongoing high blood pressure should be treated with medicines if weight loss and exercise do not work.  If you are 37-19 years old, ask your health care provider if you should take aspirin to prevent strokes.  Diabetes screening is done by taking a blood sample to check your blood glucose level after you have not eaten for a certain period of time (fasting). If you are not overweight and you do not have risk factors for diabetes, you should be screened once every 3 years starting at age 51. If you are overweight or obese and you are 70-51 years of age, you should be screened for diabetes every year as part of your cardiovascular risk assessment.  Breast cancer screening is essential preventive care for women. You should practice "breast self-awareness." This means understanding the normal appearance and feel of your breasts and may include breast self-examination. Any changes detected, no matter how small, should be reported to a health care provider. Women in their 9s and 30s should have a clinical breast exam (CBE) by a health care provider as part of a regular health exam every 1 to 3 years. After age 44, women should have a CBE every year. Starting at age 37, women should consider having a mammogram (breast X-ray test) every year. Women who have a family history of breast cancer should talk to their health care provider about genetic screening. Women at a high risk of breast cancer should talk to their health care providers about having an MRI and a mammogram every year.  Breast cancer gene (BRCA)-related cancer risk assessment is recommended for women who have family members with BRCA-related cancers. BRCA-related cancers include breast, ovarian, tubal, and peritoneal cancers. Having  family members with these cancers may be associated with an increased risk for harmful changes (mutations) in the breast cancer genes BRCA1 and BRCA2. Results of the assessment will determine the need for genetic counseling and BRCA1 and BRCA2 testing.  Your health care provider may recommend that you be screened regularly for cancer of the pelvic organs (ovaries, uterus, and vagina). This screening involves a pelvic examination, including checking for microscopic changes to the surface of your cervix (Pap test). You may be encouraged to have this screening done every 3 years, beginning at age 32.  For women ages 81-65, health care providers may recommend pelvic exams and Pap testing every 3 years, or they may recommend the Pap and pelvic exam, combined with testing for human papilloma virus (HPV), every 5 years. Some types of HPV increase your risk of cervical cancer. Testing for HPV may also be done on women of any age with unclear Pap test results.  Other health care providers may not recommend any screening for  nonpregnant women who are considered low risk for pelvic cancer and who do not have symptoms. Ask your health care provider if a screening pelvic exam is right for you.  If you have had past treatment for cervical cancer or a condition that could lead to cancer, you need Pap tests and screening for cancer for at least 20 years after your treatment. If Pap tests have been discontinued, your risk factors (such as having a new sexual partner) need to be reassessed to determine if screening should resume. Some women have medical problems that increase the chance of getting cervical cancer. In these cases, your health care provider may recommend more frequent screening and Pap tests.  Colorectal cancer can be detected and often prevented. Most routine colorectal cancer screening begins at the age of 37 years and continues through age 55 years. However, your health care provider may recommend  screening at an earlier age if you have risk factors for colon cancer. On a yearly basis, your health care provider may provide home test kits to check for hidden blood in the stool. Use of a small camera at the end of a tube, to directly examine the colon (sigmoidoscopy or colonoscopy), can detect the earliest forms of colorectal cancer. Talk to your health care provider about this at age 29, when routine screening begins. Direct exam of the colon should be repeated every 5-10 years through age 53 years, unless early forms of precancerous polyps or small growths are found.  People who are at an increased risk for hepatitis B should be screened for this virus. You are considered at high risk for hepatitis B if:  You were born in a country where hepatitis B occurs often. Talk with your health care provider about which countries are considered high risk.  Your parents were born in a high-risk country and you have not received a shot to protect against hepatitis B (hepatitis B vaccine).  You have HIV or AIDS.  You use needles to inject street drugs.  You live with, or have sex with, someone who has hepatitis B.  You get hemodialysis treatment.  You take certain medicines for conditions like cancer, organ transplantation, and autoimmune conditions.  Hepatitis C blood testing is recommended for all people born from [birth year] through 1965 and any individual with known risks for hepatitis C.  Practice safe sex. Use condoms and avoid high-risk sexual practices to reduce the spread of sexually transmitted infections (STIs). STIs include gonorrhea, chlamydia, syphilis, trichomonas, herpes, HPV, and human immunodeficiency virus (HIV). Herpes, HIV, and HPV are viral illnesses that have no cure. They can result in disability, cancer, and death.  You should be screened for sexually transmitted illnesses (STIs) including gonorrhea and chlamydia if:  You are sexually active and are younger than 24 years.  You  are older than 24 years and your health care provider tells you that you are at risk for this type of infection.  Your sexual activity has changed since you were last screened and you are at an increased risk for chlamydia or gonorrhea. Ask your health care provider if you are at risk.  If you are at risk of being infected with HIV, it is recommended that you take a prescription medicine daily to prevent HIV infection. This is called preexposure prophylaxis (PrEP). You are considered at risk if:  You are sexually active and do not regularly use condoms or know the HIV status of your partner(s).  You take drugs by injection.  You  are sexually active with a partner who has HIV.  Talk with your health care provider about whether you are at high risk of being infected with HIV. If you choose to begin PrEP, you should first be tested for HIV. You should then be tested every 3 months for as long as you are taking PrEP.  Osteoporosis is a disease in which the bones lose minerals and strength with aging. This can result in serious bone fractures or breaks. The risk of osteoporosis can be identified using a bone density scan. Women ages 87 years and over and women at risk for fractures or osteoporosis should discuss screening with their health care providers. Ask your health care provider whether you should take a calcium supplement or vitamin D to reduce the rate of osteoporosis.  Menopause can be associated with physical symptoms and risks. Hormone replacement therapy is available to decrease symptoms and risks. You should talk to your health care provider about whether hormone replacement therapy is right for you.  Use sunscreen. Apply sunscreen liberally and repeatedly throughout the day. You should seek shade when your shadow is shorter than you. Protect yourself by wearing long sleeves, pants, a wide-brimmed hat, and sunglasses year round, whenever you are outdoors.  Once a month, do a whole body  skin exam, using a mirror to look at the skin on your back. Tell your health care provider of new moles, moles that have irregular borders, moles that are larger than a pencil eraser, or moles that have changed in shape or color.  Stay current with required vaccines (immunizations).  Influenza vaccine. All adults should be immunized every year.  Tetanus, diphtheria, and acellular pertussis (Td, Tdap) vaccine. Pregnant women should receive 1 dose of Tdap vaccine during each pregnancy. The dose should be obtained regardless of the length of time since the last dose. Immunization is preferred during the 27th-36th week of gestation. An adult who has not previously received Tdap or who does not know her vaccine status should receive 1 dose of Tdap. This initial dose should be followed by tetanus and diphtheria toxoids (Td) booster doses every 10 years. Adults with an unknown or incomplete history of completing a 3-dose immunization series with Td-containing vaccines should begin or complete a primary immunization series including a Tdap dose. Adults should receive a Td booster every 10 years.  Varicella vaccine. An adult without evidence of immunity to varicella should receive 2 doses or a second dose if she has previously received 1 dose. Pregnant females who do not have evidence of immunity should receive the first dose after pregnancy. This first dose should be obtained before leaving the health care facility. The second dose should be obtained 4-8 weeks after the first dose.  Human papillomavirus (HPV) vaccine. Females aged 13-26 years who have not received the vaccine previously should obtain the 3-dose series. The vaccine is not recommended for use in pregnant females. However, pregnancy testing is not needed before receiving a dose. If a female is found to be pregnant after receiving a dose, no treatment is needed. In that case, the remaining doses should be delayed until after the pregnancy.  Immunization is recommended for any person with an immunocompromised condition through the age of 12 years if she did not get any or all doses earlier. During the 3-dose series, the second dose should be obtained 4-8 weeks after the first dose. The third dose should be obtained 24 weeks after the first dose and 16 weeks after the second  dose.  Zoster vaccine. One dose is recommended for adults aged 74 years or older unless certain conditions are present.  Measles, mumps, and rubella (MMR) vaccine. Adults born before [birth year] generally are considered immune to measles and mumps. Adults born in [birth year] or later should have 1 or more doses of MMR vaccine unless there is a contraindication to the vaccine or there is laboratory evidence of immunity to each of the three diseases. A routine second dose of MMR vaccine should be obtained at least 28 days after the first dose for students attending postsecondary schools, health care workers, or international travelers. People who received inactivated measles vaccine or an unknown type of measles vaccine during 1963-1967 should receive 2 doses of MMR vaccine. People who received inactivated mumps vaccine or an unknown type of mumps vaccine before 1979 and are at high risk for mumps infection should consider immunization with 2 doses of MMR vaccine. For females of childbearing age, rubella immunity should be determined. If there is no evidence of immunity, females who are not pregnant should be vaccinated. If there is no evidence of immunity, females who are pregnant should delay immunization until after pregnancy. Unvaccinated health care workers born before [birth year] who lack laboratory evidence of measles, mumps, or rubella immunity or laboratory confirmation of disease should consider measles and mumps immunization with 2 doses of MMR vaccine or rubella immunization with 1 dose of MMR vaccine.  Pneumococcal 13-valent conjugate (PCV13) vaccine. When indicated, a person who is  uncertain of his immunization history and has no record of immunization should receive the PCV13 vaccine. All adults 51 years of age and older should receive this vaccine. An adult aged 65 years or older who has certain medical conditions and has not been previously immunized should receive 1 dose of PCV13 vaccine. This PCV13 should be followed with a dose of pneumococcal polysaccharide (PPSV23) vaccine. Adults who are at high risk for pneumococcal disease should obtain the PPSV23 vaccine at least 8 weeks after the dose of PCV13 vaccine. Adults older than 80 years of age who have normal immune system function should obtain the PPSV23 vaccine dose at least 1 year after the dose of PCV13 vaccine.  Pneumococcal polysaccharide (PPSV23) vaccine. When PCV13 is also indicated, PCV13 should be obtained first. All adults aged 12 years and older should be immunized. An adult younger than age 73 years who has certain medical conditions should be immunized. Any person who resides in a nursing home or long-term care facility should be immunized. An adult smoker should be immunized. People with an immunocompromised condition and certain other conditions should receive both PCV13 and PPSV23 vaccines. People with human immunodeficiency virus (HIV) infection should be immunized as soon as possible after diagnosis. Immunization during chemotherapy or radiation therapy should be avoided. Routine use of PPSV23 vaccine is not recommended for American Indians, [place] Natives, or people younger than 65 years unless there are medical conditions that require PPSV23 vaccine. When indicated, people who have unknown immunization and have no record of immunization should receive PPSV23 vaccine. One-time revaccination 5 years after the first dose of PPSV23 is recommended for people aged 19-64 years who have chronic kidney failure, nephrotic syndrome, asplenia, or immunocompromised conditions. People who received 1-2 doses of PPSV23 before age  40 years should receive another dose of PPSV23 vaccine at age 58 years or later if at least 5 years have passed since the previous dose. Doses of PPSV23 are not needed for people immunized with PPSV23 at or after  age 18 years.  Meningococcal vaccine. Adults with asplenia or persistent complement component deficiencies should receive 2 doses of quadrivalent meningococcal conjugate (MenACWY-D) vaccine. The doses should be obtained at least 2 months apart. Microbiologists working with certain meningococcal bacteria, [employer] recruits, people at risk during an outbreak, and people who travel to or live in countries with a high rate of meningitis should be immunized. A first-year college student up through age 77 years who is living in a residence hall should receive a dose if she did not receive a dose on or after her 16th birthday. Adults who have certain high-risk conditions should receive one or more doses of vaccine.  Hepatitis A vaccine. Adults who wish to be protected from this disease, have certain high-risk conditions, work with hepatitis A-infected animals, work in hepatitis A research labs, or travel to or work in countries with a high rate of hepatitis A should be immunized. Adults who were previously unvaccinated and who anticipate close contact with an international adoptee during the first 60 days after arrival in the [place] from a country with a high rate of hepatitis A should be immunized.  Hepatitis B vaccine. Adults who wish to be protected from this disease, have certain high-risk conditions, may be exposed to blood or other infectious body fluids, are household contacts or sex partners of hepatitis B positive people, are clients or workers in certain care facilities, or travel to or work in countries with a high rate of hepatitis B should be immunized.  Haemophilus influenzae type b (Hib) vaccine. A previously unvaccinated person with asplenia or sickle cell disease or having a  scheduled splenectomy should receive 1 dose of Hib vaccine. Regardless of previous immunization, a recipient of a hematopoietic stem cell transplant should receive a 3-dose series 6-12 months after her successful transplant. Hib vaccine is not recommended for adults with HIV infection. Preventive Services / Frequency Ages 64 to 72 years  Blood pressure check.** / Every 3-5 years.  Lipid and cholesterol check.** / Every 5 years beginning at age 7.  Clinical breast exam.** / Every 3 years for women in their 59s and 24s.  BRCA-related cancer risk assessment.** / For women who have family members with a BRCA-related cancer (breast, ovarian, tubal, or peritoneal cancers).  Pap test.** / Every 2 years from ages 93 through 85. Every 3 years starting at age 50 through age 3 or 5 with a history of 3 consecutive normal Pap tests.  HPV screening.** / Every 3 years from ages 37 through ages 50 to 35 with a history of 3 consecutive normal Pap tests.  Hepatitis C blood test.** / For any individual with known risks for hepatitis C.  Skin self-exam. / Monthly.  Influenza vaccine. / Every year.  Tetanus, diphtheria, and acellular pertussis (Tdap, Td) vaccine.** / Consult your health care provider. Pregnant women should receive 1 dose of Tdap vaccine during each pregnancy. 1 dose of Td every 10 years.  Varicella vaccine.** / Consult your health care provider. Pregnant females who do not have evidence of immunity should receive the first dose after pregnancy.  HPV vaccine. / 3 doses over 6 months, if 39 and younger. The vaccine is not recommended for use in pregnant females. However, pregnancy testing is not needed before receiving a dose.  Measles, mumps, rubella (MMR) vaccine.** / You need at least 1 dose of MMR if you were born in 1957 or later. You may also need a 2nd dose. For females of childbearing age, rubella  immunity should be determined. If there is no evidence of immunity, females who are not  pregnant should be vaccinated. If there is no evidence of immunity, females who are pregnant should delay immunization until after pregnancy.  Pneumococcal 13-valent conjugate (PCV13) vaccine.** / Consult your health care provider.  Pneumococcal polysaccharide (PPSV23) vaccine.** / 1 to 2 doses if you smoke cigarettes or if you have certain conditions.  Meningococcal vaccine.** / 1 dose if you are age 60 to 106 years and a Market researcher living in a residence hall, or have one of several medical conditions, you need to get vaccinated against meningococcal disease. You may also need additional booster doses.  Hepatitis A vaccine.** / Consult your health care provider.  Hepatitis B vaccine.** / Consult your health care provider.  Haemophilus influenzae type b (Hib) vaccine.** / Consult your health care provider. Ages 33 to 59 years  Blood pressure check.** / Every year.  Lipid and cholesterol check.** / Every 5 years beginning at age 57 years.  Lung cancer screening. / Every year if you are aged 3-80 years and have a 30-pack-year history of smoking and currently smoke or have quit within the past 15 years. Yearly screening is stopped once you have quit smoking for at least 15 years or develop a health problem that would prevent you from having lung cancer treatment.  Clinical breast exam.** / Every year after age 25 years.  BRCA-related cancer risk assessment.** / For women who have family members with a BRCA-related cancer (breast, ovarian, tubal, or peritoneal cancers).  Mammogram.** / Every year beginning at age 66 years and continuing for as long as you are in good health. Consult with your health care provider.  Pap test.** / Every 3 years starting at age 97 years through age 84 or 1 years with a history of 3 consecutive normal Pap tests.  HPV screening.** / Every 3 years from ages 4 years through ages 44 to 59 years with a history of 3 consecutive normal Pap  tests.  Fecal occult blood test (FOBT) of stool. / Every year beginning at age 32 years and continuing until age 45 years. You may not need to do this test if you get a colonoscopy every 10 years.  Flexible sigmoidoscopy or colonoscopy.** / Every 5 years for a flexible sigmoidoscopy or every 10 years for a colonoscopy beginning at age 41 years and continuing until age 82 years.  Hepatitis C blood test.** / For all people born from [birth year] through 1963 and any individual with known risks for hepatitis C.  Skin self-exam. / Monthly.  Influenza vaccine. / Every year.  Tetanus, diphtheria, and acellular pertussis (Tdap/Td) vaccine.** / Consult your health care provider. Pregnant women should receive 1 dose of Tdap vaccine during each pregnancy. 1 dose of Td every 10 years.  Varicella vaccine.** / Consult your health care provider. Pregnant females who do not have evidence of immunity should receive the first dose after pregnancy.  Zoster vaccine.** / 1 dose for adults aged 57 years or older.  Measles, mumps, rubella (MMR) vaccine.** / You need at least 1 dose of MMR if you were born in 1957 or later. You may also need a second dose. For females of childbearing age, rubella immunity should be determined. If there is no evidence of immunity, females who are not pregnant should be vaccinated. If there is no evidence of immunity, females who are pregnant should delay immunization until after pregnancy.  Pneumococcal 13-valent conjugate (PCV13) vaccine.** /  Consult your health care provider.  Pneumococcal polysaccharide (PPSV23) vaccine.** / 1 to 2 doses if you smoke cigarettes or if you have certain conditions.  Meningococcal vaccine.** / Consult your health care provider.  Hepatitis A vaccine.** / Consult your health care provider.  Hepatitis B vaccine.** / Consult your health care provider.  Haemophilus influenzae type b (Hib) vaccine.** / Consult your health care provider. Ages 42 years and  over  Blood pressure check.** / Every year.  Lipid and cholesterol check.** / Every 5 years beginning at age 42 years.  Lung cancer screening. / Every year if you are aged 22-80 years and have a 30-pack-year history of smoking and currently smoke or have quit within the past 15 years. Yearly screening is stopped once you have quit smoking for at least 15 years or develop a health problem that would prevent you from having lung cancer treatment.  Clinical breast exam.** / Every year after age 86 years.  BRCA-related cancer risk assessment.** / For women who have family members with a BRCA-related cancer (breast, ovarian, tubal, or peritoneal cancers).  Mammogram.** / Every year beginning at age 48 years and continuing for as long as you are in good health. Consult with your health care provider.  Pap test.** / Every 3 years starting at age 34 years through age 84 or 73 years with 3 consecutive normal Pap tests. Testing can be stopped between 65 and 70 years with 3 consecutive normal Pap tests and no abnormal Pap or HPV tests in the past 10 years.  HPV screening.** / Every 3 years from ages 82 years through ages 44 or 50 years with a history of 3 consecutive normal Pap tests. Testing can be stopped between 65 and 70 years with 3 consecutive normal Pap tests and no abnormal Pap or HPV tests in the past 10 years.  Fecal occult blood test (FOBT) of stool. / Every year beginning at age 37 years and continuing until age 37 years. You may not need to do this test if you get a colonoscopy every 10 years.  Flexible sigmoidoscopy or colonoscopy.** / Every 5 years for a flexible sigmoidoscopy or every 10 years for a colonoscopy beginning at age 39 years and continuing until age 21 years.  Hepatitis C blood test.** / For all people born from [birth year] through 1965 and any individual with known risks for hepatitis C.  Osteoporosis screening.** / A one-time screening for women ages 55 years and over and women  at risk for fractures or osteoporosis.  Skin self-exam. / Monthly.  Influenza vaccine. / Every year.  Tetanus, diphtheria, and acellular pertussis (Tdap/Td) vaccine.** / 1 dose of Td every 10 years.  Varicella vaccine.** / Consult your health care provider.  Zoster vaccine.** / 1 dose for adults aged 66 years or older.  Pneumococcal 13-valent conjugate (PCV13) vaccine.** / Consult your health care provider.  Pneumococcal polysaccharide (PPSV23) vaccine.** / 1 dose for all adults aged 17 years and older.  Meningococcal vaccine.** / Consult your health care provider.  Hepatitis A vaccine.** / Consult your health care provider.  Hepatitis B vaccine.** / Consult your health care provider.  Haemophilus influenzae type b (Hib) vaccine.** / Consult your health care provider. ** Family history and personal history of risk and conditions may change your health care provider's recommendations.   This information is not intended to replace advice given to you by your health care provider. Make sure you discuss any questions you have with your health care provider.  Document Released: 04/15/2001 Document Revised: 03/10/2014 Document Reviewed: 07/15/2010 Elsevier Interactive Patient Education [employer].

## 2016-01-10 NOTE — Assessment & Plan Note (Signed)
Patient denies any difficulties at home. No trouble with ADLs, depression or falls. See EMR for functional status screen and depression screen. No recent changes to vision or hearing. Is UTD with immunizations. Is UTD with screening. Discussed Advanced Directives. Encouraged heart healthy diet, exercise as tolerated and adequate sleep. See patient's problem list for health risk factors to monitor. See AVS for preventative healthcare recommendation schedule. 

## 2016-01-10 NOTE — Assessment & Plan Note (Signed)
minimize simple carbs. Increase exercise

## 2016-01-10 NOTE — Assessment & Plan Note (Signed)
Well controlled, no changes to meds. Encouraged heart healthy diet such as the DASH diet and exercise as tolerated.  °

## 2016-01-10 NOTE — Assessment & Plan Note (Signed)
Tolerating statin, encouraged heart healthy diet, avoid trans fats, minimize simple carbs and saturated fats. Increase exercise as tolerated 

## 2016-01-14 ENCOUNTER — Other Ambulatory Visit: Payer: Self-pay | Admitting: Family Medicine

## 2016-01-14 LAB — VITAMIN B1: Vitamin B1 (Thiamine): 20 nmol/L (ref 8–30)

## 2016-01-14 MED ORDER — VITAMIN D (ERGOCALCIFEROL) 1.25 MG (50000 UNIT) PO CAPS: 50000.0000 [IU] | ORAL_CAPSULE | ORAL | 0 refills | Status: DC

## 2016-01-16 ENCOUNTER — Ambulatory Visit (INDEPENDENT_AMBULATORY_CARE_PROVIDER_SITE_OTHER): Payer: Medicare Other

## 2016-01-16 DIAGNOSIS — H6123 Impacted cerumen, bilateral: Secondary | ICD-10-CM | POA: Diagnosis not present

## 2016-01-16 NOTE — Progress Notes (Signed)
Pre visit review using our clinic tool,if applicable. No additional management support is needed unless otherwise documented below in the visit note.   Patient in for EAR irrigation ordered by Dr. Charlett Blake. Good results.

## 2016-01-20 ENCOUNTER — Encounter: Payer: Self-pay | Admitting: Family Medicine

## 2016-01-20 DIAGNOSIS — M84376A Stress fracture, unspecified foot, initial encounter for fracture: Secondary | ICD-10-CM

## 2016-01-20 HISTORY — DX: Stress fracture, unspecified foot, initial encounter for fracture: M84.376A

## 2016-01-20 NOTE — Assessment & Plan Note (Signed)
Rolled ankle on 11/7 and reports pain is manageable but does persist, xray confirms stress fracture. Try topical Lidocine and placed in a hard bottom shoe. Will need referral

## 2016-01-20 NOTE — Progress Notes (Signed)
Patient ID: Christine Henderson, female   DOB: Oct 13, 1930, 82 y.o.   MRN: [id number]   Subjective:    Patient ID: Christine Henderson, female    DOB: [birth year], 82 y.o.   MRN: [id number]  Chief Complaint  Patient presents with  . Annual Exam    HPI Patient is in today for annual Medicare Wellness Exam.she feels well today but did twist her ankle 2 days ago while arising from her chair and getting her foot caught in her blanket. Only mild pain with use and palpation. She notes she inverted her ankle. Only mild swelling. She also acknowledges some feelings of light headedness at times when she arises too quickly. No headaches or other neurologic complaints. She is doing well at home and denies any trouble with ADLs at home. She is eating well and staying active. Denies CP/palp/SOB/HA/congestion/fevers/GI or GU c/o. Taking meds as prescribed  Past Medical History:  Diagnosis Date  . Abdominal pain, unspecified site 06/26/2008  . ALLERGIC RHINITIS CAUSE UNSPECIFIED 02/02/2008  . Annual physical exam 12/26/2012  . Diarrhea 06/26/2008  . Fibrocystic breast disease   . Headache(784.0) 10/01/2006  . Hereditary and idiopathic peripheral neuropathy 01/10/2016  . HYPERLIPIDEMIA 10/01/2006  . HYPERTENSION 10/06/2007  . IBS (irritable bowel syndrome)   . Insomnia 11/14/2012  . Irritable bowel syndrome 09/10/2006  . Medicare annual wellness visit, subsequent 12/26/2012   Sees Dr Elinor Parkinson of opthamology.  MGM 5/14     . Metatarsal stress fracture 01/20/2016  . MIGRAINE HEADACHE 09/10/2006  . MVP (mitral valve prolapse)   . Onychomycosis 07/09/2014  . OSTEOPENIA 09/10/2006  . Osteopenia 11/12/2012  . Palpitations 09/10/2006  . PHOTOPHOBIA 07/20/2009  . SKIN TAG 11/09/2006  . VAGINITIS, ATROPHIC 10/01/2006    Past Surgical History:  Procedure Laterality Date  . DENTAL SURGERY  09/2014  . ROTATOR CUFF REPAIR Right   . TONSILLECTOMY      Family History  Problem Relation Age of Onset  . Inflammatory bowel  disease Sister   . Alzheimer's disease Sister   . Pancreatic cancer Maternal Grandfather   . Coronary artery disease Neg Hx   . Alzheimer's disease Mother   . Hyperlipidemia Son   . Obesity Son   . Hyperlipidemia Son   . Heart disease Paternal Uncle     Social History   Social History  . Marital status: Widowed    Spouse name: N/A  . Number of children: 4  . Years of education: N/A   Occupational History  . retired Retired   Social History Main Topics  . Smoking status: Never Smoker  . Smokeless tobacco: Never Used  . Alcohol use No  . Drug use: No  . Sexual activity: Yes    Birth control/ protection: None     Comment: regular exercise, lives at the [place], widowed in 2014   Other Topics Concern  . Not on file   Social History Narrative  . No narrative on file    Outpatient Medications Prior to Visit  Medication Sig Dispense Refill  . Ascorbic Acid (VITAMIN C) 1000 MG tablet Take 1,000 mg by mouth daily.      [name] Kitchen aspirin 81 MG tablet Take 81 mg by mouth daily.      [name] Kitchen atenolol (TENORMIN) 25 MG tablet Take 0.5 tablets (12.5 mg total) by mouth daily. 45 tablet 1  . Calcium Carbonate-Vitamin D (CALCIUM PLUS VITAMIN D PO) Take 1,200 tablets by mouth daily.     [name] Kitchen conjugated estrogens (PREMARIN)  vaginal cream Place 0.5 g vaginally once a week.     . fish oil-omega-3 fatty acids 1000 MG capsule Take 2 g by mouth daily.      . Glucosamine-Chondroit-Vit C-Mn (GLUCOSAMINE 1500 COMPLEX) CAPS Take 1,500 capsules by mouth 2 (two) times daily.      [name] Kitchen loperamide (IMODIUM) 2 MG capsule Take 2 mg by mouth 4 (four) times daily as needed. For diarrhea    . losartan (COZAAR) 25 MG tablet Take 1 tablet by mouth  daily 90 tablet 1  . Multiple Vitamin (MULTIVITAMIN) tablet Take 1 tablet by mouth daily.      . simvastatin (ZOCOR) 10 MG tablet Take 1 tablet by mouth at  bedtime 90 tablet 1   No facility-administered medications prior to visit.     Allergies  Allergen Reactions  .  Codeine     REACTION: Nausea  Vomiting    Review of Systems  Constitutional: Negative for chills, fever and malaise/fatigue.  HENT: Negative for congestion and hearing loss.   Eyes: Negative for photophobia and discharge.  Respiratory: Negative for cough, sputum production and shortness of breath.   Cardiovascular: Negative for chest pain, palpitations and leg swelling.  Gastrointestinal: Negative for abdominal pain, blood in stool, constipation, diarrhea, heartburn, nausea and vomiting.  Genitourinary: Negative for dysuria, frequency, hematuria and urgency.  Musculoskeletal: Positive for joint pain. Negative for back pain, falls and myalgias.       Mild pain with palpation over distal left 5th metatarsal  Skin: Negative for rash.  Neurological: Negative for dizziness, sensory change, loss of consciousness, weakness and headaches.  Endo/Heme/Allergies: Negative for environmental allergies. Does not bruise/bleed easily.  Psychiatric/Behavioral: Negative for depression and suicidal ideas. The patient is not nervous/anxious and does not have insomnia.        Objective:    Physical Exam  Constitutional: She is oriented to person, place, and time. She appears well-developed and well-nourished. No distress.  HENT:  Head: Normocephalic and atraumatic.  Eyes: Conjunctivae are normal.  Neck: Neck supple. No thyromegaly present.  Cardiovascular: Normal rate, regular rhythm and normal heart sounds.   No murmur heard. Pulmonary/Chest: Effort normal and breath sounds normal. No respiratory distress.  Abdominal: Soft. Bowel sounds are normal. She exhibits no distension and no mass. There is no tenderness.  Musculoskeletal: She exhibits tenderness. She exhibits no edema.  Mild pain with palpation over distal left 5th metatarsal  Lymphadenopathy:    She has no cervical adenopathy.  Neurological: She is alert and oriented to person, place, and time.  Skin: Skin is warm and dry.  Psychiatric:  She has a normal mood and affect. Her behavior is normal.    BP 120/76 (BP Location: Left Arm, Patient Position: Sitting, Cuff Size: Normal)   Pulse 66   Temp 98 F (36.7 C) (Oral)   Ht 5\' 4"  (1.626 m)   Wt 124 lb 4 oz (56.4 kg)   SpO2 99%   BMI 21.33 kg/m  Wt Readings from Last 3 Encounters:  01/10/16 124 lb 4 oz (56.4 kg)  07/10/15 124 lb 8 oz (56.5 kg)  01/08/15 118 lb 8 oz (53.8 kg)     Lab Results  Component Value Date   WBC 7.5 01/10/2016   HGB 13.1 01/10/2016   HCT 39.2 01/10/2016   PLT 286.0 01/10/2016   GLUCOSE 111 (H) 01/10/2016   CHOL 174 01/10/2016   TRIG 79.0 01/10/2016   HDL 68.90 01/10/2016   LDLCALC 90 01/10/2016   ALT 20  01/10/2016   AST 28 01/10/2016   NA 139 01/10/2016   K 4.2 01/10/2016   CL 103 01/10/2016   CREATININE 1.03 01/10/2016   BUN 26 (H) 01/10/2016   CO2 30 01/10/2016   TSH 2.56 01/10/2016   HGBA1C 6.0 01/10/2016    Lab Results  Component Value Date   TSH 2.56 01/10/2016   Lab Results  Component Value Date   WBC 7.5 01/10/2016   HGB 13.1 01/10/2016   HCT 39.2 01/10/2016   MCV 95.9 01/10/2016   PLT 286.0 01/10/2016   Lab Results  Component Value Date   NA 139 01/10/2016   K 4.2 01/10/2016   CO2 30 01/10/2016   GLUCOSE 111 (H) 01/10/2016   BUN 26 (H) 01/10/2016   CREATININE 1.03 01/10/2016   BILITOT 1.2 01/10/2016   ALKPHOS 43 01/10/2016   AST 28 01/10/2016   ALT 20 01/10/2016   PROT 7.5 01/10/2016   ALBUMIN 4.3 01/10/2016   CALCIUM 9.9 01/10/2016   GFR 54.28 (L) 01/10/2016   Lab Results  Component Value Date   CHOL 174 01/10/2016   Lab Results  Component Value Date   HDL 68.90 01/10/2016   Lab Results  Component Value Date   LDLCALC 90 01/10/2016   Lab Results  Component Value Date   TRIG 79.0 01/10/2016   Lab Results  Component Value Date   CHOLHDL 3 01/10/2016   Lab Results  Component Value Date   HGBA1C 6.0 01/10/2016       Assessment & Plan:   Problem List Items Addressed This Visit     Hyperlipidemia    Tolerating statin, encouraged heart healthy diet, avoid trans fats, minimize simple carbs and saturated fats. Increase exercise as tolerated      Relevant Orders   Lipid panel (Completed)   Essential hypertension    Well controlled, no changes to meds. Encouraged heart healthy diet such as the DASH diet and exercise as tolerated.       Relevant Orders   CBC (Completed)   Comprehensive metabolic panel (Completed)   TSH (Completed)   Medicare annual wellness visit, subsequent - Primary    Patient denies any difficulties at home. No trouble with ADLs, depression or falls. See EMR for functional status screen and depression screen. No recent changes to vision or hearing. Is UTD with immunizations. Is UTD with screening. Discussed Advanced Directives. Encouraged heart healthy diet, exercise as tolerated and adequate sleep. See patient's problem list for health risk factors to monitor. See AVS for preventative healthcare recommendation schedule.      Hyperglycemia    minimize simple carbs. Increase exercise      Relevant Orders   Hemoglobin A1c (Completed)   Hereditary and idiopathic peripheral neuropathy   Relevant Orders   Vitamin B12 (Completed)   Vitamin B1 (Completed)   Folate (Completed)   Metatarsal stress fracture    Rolled ankle on 11/7 and reports pain is manageable but does persist, xray confirms stress fracture. Try topical Lidocine and placed in a hard bottom shoe. Will need referral        Other Visit Diagnoses    Left foot pain       Relevant Orders   DG Foot Complete Left (Completed)      I am having Ms. Teal maintain her aspirin, Calcium Carbonate-Vitamin D (CALCIUM PLUS VITAMIN D PO), multivitamin, fish oil-omega-3 fatty acids, GLUCOSAMINE 1500 COMPLEX, loperamide, vitamin C, conjugated estrogens, losartan, simvastatin, and atenolol.  No orders of the defined types  were placed in this encounter.    Penni Homans, MD

## 2016-01-28 ENCOUNTER — Telehealth: Payer: Self-pay | Admitting: Family Medicine

## 2016-01-28 NOTE — Telephone Encounter (Signed)
Caller name:Kyrie Mcdiarmid Relationship to patient: Can be [place]:  Reason for call:has been wearing her boot for over a 2 weeks, it is doing better. Would like Dr Frederik Pear advice on her next step.

## 2016-01-28 NOTE — Telephone Encounter (Signed)
Would have her wear boot for one more week and stop if pain is better.

## 2016-01-29 NOTE — Telephone Encounter (Signed)
Patient informed of PCP instructions. 

## 2016-02-18 ENCOUNTER — Telehealth: Payer: Self-pay | Admitting: Family Medicine

## 2016-02-18 ENCOUNTER — Ambulatory Visit (INDEPENDENT_AMBULATORY_CARE_PROVIDER_SITE_OTHER): Payer: Medicare Other | Admitting: Family Medicine

## 2016-02-18 ENCOUNTER — Ambulatory Visit (HOSPITAL_BASED_OUTPATIENT_CLINIC_OR_DEPARTMENT_OTHER)
Admission: RE | Admit: 2016-02-18 | Discharge: 2016-02-18 | Disposition: A | Discharge status: Home / Self Care | Payer: Medicare Other | Source: Ambulatory Visit | Attending: Family Medicine | Admitting: Family Medicine

## 2016-02-18 ENCOUNTER — Encounter: Payer: Self-pay | Admitting: Family Medicine

## 2016-02-18 VITALS — BP 160/95 | HR 66 | Temp 98.1°F | Resp 20 | Wt 126.2 lb

## 2016-02-18 DIAGNOSIS — J209 Acute bronchitis, unspecified: Secondary | ICD-10-CM

## 2016-02-18 DIAGNOSIS — R062 Wheezing: Secondary | ICD-10-CM

## 2016-02-18 DIAGNOSIS — R05 Cough: Secondary | ICD-10-CM | POA: Diagnosis not present

## 2016-02-18 DIAGNOSIS — R059 Cough, unspecified: Secondary | ICD-10-CM

## 2016-02-18 MED ORDER — DOXYCYCLINE HYCLATE 100 MG PO TABS: 100.0000 mg | ORAL_TABLET | Freq: Two times a day (BID) | ORAL | 0 refills | Status: DC

## 2016-02-18 MED ORDER — BENZONATATE 100 MG PO CAPS: 200.0000 mg | ORAL_CAPSULE | Freq: Two times a day (BID) | ORAL | 0 refills | Status: DC | PRN

## 2016-02-18 NOTE — Progress Notes (Signed)
Christine Henderson , 1930/12/24, 82 y.o., female MRN: [id number] Patient Care Team    Relationship Specialty Notifications Start End  Mosie Lukes, MD PCP - General Family Medicine  11/12/12    Comment: Merged    CC: cough Subjective: Pt presents for an acute OV with complaints of cough of 1 week duration.  Associated symptoms include sore throat, nasal congestion, dizziness, decrease appetite, weak and fatigue. Her neighbors have been recently diagnosed with bronchitis. She states it started with just a sore throat, but has progressed last few weeks to include a deeper cough and sinus pressure. She has been taking many OTC regimens her neighbors have been bringing her for sinus- decongestants.  She is tolerating PO, but has decreased appetite.     Allergies  Allergen Reactions  . Codeine     REACTION: Nausea  Vomiting   Social History  Substance Use Topics  . Smoking status: Never Smoker  . Smokeless tobacco: Never Used  . Alcohol use No   Past Medical History:  Diagnosis Date  . Abdominal pain, unspecified site 06/26/2008  . ALLERGIC RHINITIS CAUSE UNSPECIFIED 02/02/2008  . Annual physical exam 12/26/2012  . Diarrhea 06/26/2008  . Fibrocystic breast disease   . Headache(784.0) 10/01/2006  . Hereditary and idiopathic peripheral neuropathy 01/10/2016  . HYPERLIPIDEMIA 10/01/2006  . HYPERTENSION 10/06/2007  . IBS (irritable bowel syndrome)   . Insomnia 11/14/2012  . Irritable bowel syndrome 09/10/2006  . Medicare annual wellness visit, subsequent 12/26/2012   Sees Dr Elinor Parkinson of opthamology.  MGM 5/14     . Metatarsal stress fracture 01/20/2016  . MIGRAINE HEADACHE 09/10/2006  . MVP (mitral valve prolapse)   . Onychomycosis 07/09/2014  . OSTEOPENIA 09/10/2006  . Osteopenia 11/12/2012  . Palpitations 09/10/2006  . PHOTOPHOBIA 07/20/2009  . SKIN TAG 11/09/2006  . VAGINITIS, ATROPHIC 10/01/2006   Past Surgical History:  Procedure Laterality Date  . DENTAL SURGERY  09/2014  .  ROTATOR CUFF REPAIR Right   . TONSILLECTOMY     Family History  Problem Relation Age of Onset  . Inflammatory bowel disease Sister   . Alzheimer's disease Sister   . Pancreatic cancer Maternal Grandfather   . Alzheimer's disease Mother   . Hyperlipidemia Son   . Obesity Son   . Hyperlipidemia Son   . Heart disease Paternal Uncle   . Coronary artery disease Neg Hx    Allergies as of 02/18/2016      Reactions   Codeine    REACTION: Nausea  Vomiting      Medication List       Accurate as of 02/18/16  2:03 PM. Always use your most recent med list.          aspirin 81 MG tablet Take 81 mg by mouth daily.   atenolol 25 MG tablet Commonly known as:  TENORMIN Take 0.5 tablets (12.5 mg total) by mouth daily.   CALCIUM PLUS VITAMIN D PO Take 1,200 tablets by mouth daily.   conjugated estrogens vaginal cream Commonly known as:  PREMARIN Place 0.5 g vaginally once a week.   fish oil-omega-3 fatty acids 1000 MG capsule Take 2 g by mouth daily.   GLUCOSAMINE 1500 COMPLEX Caps Take 1,500 capsules by mouth 2 (two) times daily.   loperamide 2 MG capsule Commonly known as:  IMODIUM Take 2 mg by mouth 4 (four) times daily as needed. For diarrhea   losartan 25 MG tablet Commonly known as:  COZAAR Take 1 tablet  by mouth  daily   multivitamin tablet Take 1 tablet by mouth daily.   simvastatin 10 MG tablet Commonly known as:  ZOCOR Take 1 tablet by mouth at  bedtime   vitamin C 1000 MG tablet Take 1,000 mg by mouth daily.   Vitamin D (Ergocalciferol) 50000 units Caps capsule Commonly known as:  DRISDOL Take 1 capsule (50,000 Units total) by mouth every 7 (seven) days.       No results found for this or any previous visit (from the past 24 hour(s)). No results found.   ROS: Negative, with the exception of above mentioned in HPI   Objective:  BP (!) 160/95 (BP Location: Left Arm, Patient Position: Sitting, Cuff Size: Normal)   Pulse 66   Temp 98.1 F (36.7  C) (Oral)   Resp 20   Wt 126 lb 4 oz (57.3 kg)   SpO2 95%   BMI 21.67 kg/m  Body mass index is 21.67 kg/m. Gen: Afebrile. No acute distress. Nontoxic in appearance, well developed, well nourished.  HENT: AT. [place]. Bilateral TM visualized WNL. Mildy dry mucous membrane, no oral lesions. Bilateral nares without erythema, mild swelling. Throat without erythema or exudates. PND present. Mild cough present. TTP max sinus.  Eyes:Pupils Equal Round Reactive to light, Extraocular movements intact,  Conjunctiva without redness, discharge or icterus. Neck/lymp/endocrine: Supple,mild left cervical lymphadenopathy CV: RRR , Chest: diffuse wheezing, RLL crackles. Normal WOB.  Abd: Soft.NTND. BS present Neuro:  Normal gait. PERLA. EOMi. Alert. Oriented x3 C  Assessment/Plan: CHINAZA SADRI is a 82 y.o. female present for acute OV for  Cough/Wheeze/acute bronchitis - DG Chest 2 View; Future - mucinex plain or DM ok. Stop any decongestants, explained to pt those type of medications should not be used with a patient that has high blood pressure.  - doxycycline, tessalon perles prescribed.  - rest and hydrate - F/U dependent on results of cxr, pt will be called with results once available.   electronically signed by:  Howard Pouch, DO  [place]

## 2016-02-18 NOTE — Patient Instructions (Signed)
Start doxycyline 1 pill every 12 hours for 10 days.  Make sure to rest and hydrate.  Use mucinex DM or plain mucinex Tessalon perles for cough prescribed  You have signs of a sinus infection, but also lung sounds that are concerning for pneumonia/bronchitis.   We will call you with results of xray once available.

## 2016-02-18 NOTE — Telephone Encounter (Signed)
Please call pt: - her cxr is normal. No signs of pneumonia. She is to take the abx provided today, rest and hydrate. - she can f/u with her PCP or here, if not improving in 1 week, or sooner if worsening.

## 2016-02-18 NOTE — Telephone Encounter (Signed)
Patient notified and verbalized understanding. 

## 2016-03-05 DIAGNOSIS — H52203 Unspecified astigmatism, bilateral: Secondary | ICD-10-CM | POA: Diagnosis not present

## 2016-03-05 DIAGNOSIS — D3131 Benign neoplasm of right choroid: Secondary | ICD-10-CM | POA: Diagnosis not present

## 2016-03-05 DIAGNOSIS — Z961 Presence of intraocular lens: Secondary | ICD-10-CM | POA: Diagnosis not present

## 2016-03-08 ENCOUNTER — Other Ambulatory Visit: Payer: Self-pay | Admitting: Family Medicine

## 2016-03-08 DIAGNOSIS — E785 Hyperlipidemia, unspecified: Secondary | ICD-10-CM

## 2016-03-08 DIAGNOSIS — I10 Essential (primary) hypertension: Secondary | ICD-10-CM

## 2016-05-27 DIAGNOSIS — L821 Other seborrheic keratosis: Secondary | ICD-10-CM | POA: Diagnosis not present

## 2016-05-27 DIAGNOSIS — L814 Other melanin hyperpigmentation: Secondary | ICD-10-CM | POA: Diagnosis not present

## 2016-05-27 DIAGNOSIS — D1801 Hemangioma of skin and subcutaneous tissue: Secondary | ICD-10-CM | POA: Diagnosis not present

## 2016-06-03 ENCOUNTER — Encounter: Payer: Self-pay | Admitting: Podiatry

## 2016-06-03 ENCOUNTER — Ambulatory Visit (INDEPENDENT_AMBULATORY_CARE_PROVIDER_SITE_OTHER): Payer: Medicare Other | Admitting: Podiatry

## 2016-06-03 VITALS — BP 165/96 | HR 71 | Ht 64.0 in | Wt 124.0 lb

## 2016-06-03 DIAGNOSIS — L600 Ingrowing nail: Secondary | ICD-10-CM | POA: Diagnosis not present

## 2016-06-03 DIAGNOSIS — M79672 Pain in left foot: Secondary | ICD-10-CM

## 2016-06-03 DIAGNOSIS — B351 Tinea unguium: Secondary | ICD-10-CM | POA: Diagnosis not present

## 2016-06-03 DIAGNOSIS — M79671 Pain in right foot: Secondary | ICD-10-CM | POA: Diagnosis not present

## 2016-06-03 NOTE — Patient Instructions (Signed)
Seen for hypertrophic ingrown nails. Right great toe ingrown nail at lateral border removed. All nails debrided. Return in 3 months or as needed.

## 2016-06-03 NOTE — Progress Notes (Signed)
SUBJECTIVE: 83 y.o. year old female presents complaining of painful ingrown nail on right great toe and ingrown deformed nail on left great toe.  REVIEW OF SYSTEMS: Pertinent items noted in HPI and remainder of comprehensive ROS otherwise negative.  OBJECTIVE: DERMATOLOGIC EXAMINATION: Nails: Thick dystrophic ingrown nail painful on right great toe and deformed nail on left great toe. All nails are mycotic. VASCULAR EXAMINATION OF LOWER LIMBS: Posterior tibial pulses are palpable with normal pulsation.  Dorsalis Pedis arteries are not palpable on both feet.  Varicose veins on both feet.  Capillary Filling times within 3 seconds in all digits.  No edema or erythema noted. Temperature gradient from tibial crest to dorsum of foot is within normal bilateral.  NEUROLOGIC EXAMINATION OF THE LOWER LIMBS: Achilles DTR is present and within normal. Monofilament (Semmes-Weinstein 10-gm) sensory testing positive 6 out of 6, bilateral. Vibratory sensations(128Hz  turning fork) intact at medial and lateral forefoot bilateral.  Sharp and Dull discriminatory sensations at the plantar ball of hallux is intact bilateral.   MUSCULOSKELETAL EXAMINATION: No growth deformities noted.  ASSESSMENT: Ingrown nail right hallux painful. Mycotic nails x 10. Painful nails.  PLAN:  Reviewed findings and available treatment options. All nails debrided. Return in 3 months or as needed.

## 2016-07-14 ENCOUNTER — Encounter: Payer: Self-pay | Admitting: Family Medicine

## 2016-07-14 ENCOUNTER — Ambulatory Visit (INDEPENDENT_AMBULATORY_CARE_PROVIDER_SITE_OTHER): Payer: Medicare Other | Admitting: Family Medicine

## 2016-07-14 VITALS — BP 98/62 | HR 60 | Temp 97.8°F | Resp 18 | Wt 127.0 lb

## 2016-07-14 DIAGNOSIS — E782 Mixed hyperlipidemia: Secondary | ICD-10-CM

## 2016-07-14 DIAGNOSIS — E559 Vitamin D deficiency, unspecified: Secondary | ICD-10-CM | POA: Diagnosis not present

## 2016-07-14 DIAGNOSIS — Z0000 Encounter for general adult medical examination without abnormal findings: Secondary | ICD-10-CM

## 2016-07-14 DIAGNOSIS — R739 Hyperglycemia, unspecified: Secondary | ICD-10-CM | POA: Diagnosis not present

## 2016-07-14 DIAGNOSIS — M545 Low back pain: Secondary | ICD-10-CM | POA: Diagnosis not present

## 2016-07-14 DIAGNOSIS — H02835 Dermatochalasis of left lower eyelid: Secondary | ICD-10-CM | POA: Diagnosis not present

## 2016-07-14 DIAGNOSIS — I10 Essential (primary) hypertension: Secondary | ICD-10-CM | POA: Diagnosis not present

## 2016-07-14 DIAGNOSIS — H02831 Dermatochalasis of right upper eyelid: Secondary | ICD-10-CM | POA: Diagnosis not present

## 2016-07-14 HISTORY — DX: Vitamin D deficiency, unspecified: E55.9

## 2016-07-14 LAB — COMPREHENSIVE METABOLIC PANEL
ALK PHOS: 45 U/L (ref 39–117)
ALT: 18 U/L (ref 0–35)
AST: 25 U/L (ref 0–37)
Albumin: 4.4 g/dL (ref 3.5–5.2)
BUN: 23 mg/dL (ref 6–23)
CALCIUM: 10.7 mg/dL — AB (ref 8.4–10.5)
CO2: 29 meq/L (ref 19–32)
Chloride: 103 mEq/L (ref 96–112)
Creatinine, Ser: 1.07 mg/dL (ref 0.40–1.20)
GFR: 51.89 mL/min — AB (ref >60.00)
GLUCOSE: 96 mg/dL (ref 70–99)
POTASSIUM: 4 meq/L (ref 3.5–5.1)
Sodium: 140 mEq/L (ref 135–145)
Total Bilirubin: 1.3 mg/dL — ABNORMAL HIGH (ref 0.2–1.2)
Total Protein: 7.4 g/dL (ref 6.0–8.3)

## 2016-07-14 LAB — LIPID PANEL
Cholesterol: 161 mg/dL (ref 0–200)
HDL: 70 mg/dL (ref >39.00)
LDL Cholesterol: 75 mg/dL (ref 0–99)
NonHDL: 91.36
TRIGLYCERIDES: 83 mg/dL (ref 0.0–149.0)
Total CHOL/HDL Ratio: 2
VLDL: 16.6 mg/dL (ref 0.0–40.0)

## 2016-07-14 LAB — CBC
HCT: 39.8 % (ref 36.0–46.0)
HEMOGLOBIN: 13.3 g/dL (ref 12.0–15.0)
MCHC: 33.4 g/dL (ref 30.0–36.0)
MCV: 96.9 fl (ref 78.0–100.0)
PLATELETS: 249 10*3/uL (ref 150.0–400.0)
RBC: 4.11 Mil/uL (ref 3.87–5.11)
RDW: 13.9 % (ref 11.5–15.5)
WBC: 7.3 10*3/uL (ref 4.0–10.5)

## 2016-07-14 LAB — TSH: TSH: 2.34 u[IU]/mL (ref 0.35–4.50)

## 2016-07-14 LAB — HEMOGLOBIN A1C: Hgb A1c MFr Bld: 6.2 % (ref 4.6–6.5)

## 2016-07-14 NOTE — Progress Notes (Signed)
Subjective:  I acted as a Education administrator for Dr. Charlett Blake. [place], [place]   Patient ID: Christine Henderson, female    DOB: 1930/03/14, 83 y.o.   MRN: [id number]  Chief Complaint  Patient presents with  . Annual Exam    HPI  Patient is in today for annual preventative exam and follow up on chronic medical conditions. No recent febrile illness or hospitalizations. Had a bronchitis in the fall but she recovered with treatment rather quickly. She has a PMH of HTN, hyperlipidemia, osteopenia and hyperglycemia. She is mostly feeling well. No recent Febrile illness or hospitalizations. Her greatest complaint is of some right back/flank pain. She notes it is significantly worse after heavy work especially in her garden. She notes the scoliosis in her back has been worsening and she believes that is contributing. No falls or injury. She is doing well with her activities of daily living at home but does have to pace herself to get her worked him without significant pain. Denies CP/palp/SOB/HA/congestion/fevers/GI or GU c/o. Taking meds as prescribed  Patient Care Team: Mosie Lukes, MD as PCP - General (Family Medicine)   Past Medical History:  Diagnosis Date  . Abdominal pain, unspecified site 06/26/2008  . ALLERGIC RHINITIS CAUSE UNSPECIFIED 02/02/2008  . Annual physical exam 12/26/2012  . Diarrhea 06/26/2008  . Fibrocystic breast disease   . Headache(784.0) 10/01/2006  . Hereditary and idiopathic peripheral neuropathy 01/10/2016  . HYPERLIPIDEMIA 10/01/2006  . HYPERTENSION 10/06/2007  . IBS (irritable bowel syndrome)   . Insomnia 11/14/2012  . Irritable bowel syndrome 09/10/2006  . Medicare annual wellness visit, subsequent 12/26/2012   Sees Dr Elinor Parkinson of opthamology.  MGM 5/14     . Metatarsal stress fracture 01/20/2016  . MIGRAINE HEADACHE 09/10/2006  . MVP (mitral valve prolapse)   . Onychomycosis 07/09/2014  . OSTEOPENIA 09/10/2006  . Osteopenia 11/12/2012  . Palpitations 09/10/2006  .  PHOTOPHOBIA 07/20/2009  . SKIN TAG 11/09/2006  . VAGINITIS, ATROPHIC 10/01/2006  . Vitamin D deficiency 07/14/2016    Past Surgical History:  Procedure Laterality Date  . DENTAL SURGERY  09/2014  . ROTATOR CUFF REPAIR Right   . TONSILLECTOMY      Family History  Problem Relation Age of Onset  . Inflammatory bowel disease Sister   . Alzheimer's disease Sister   . Pancreatic cancer Maternal Grandfather   . Alzheimer's disease Mother   . Hyperlipidemia Son   . Obesity Son   . Hyperlipidemia Son   . Heart disease Paternal Uncle   . Coronary artery disease Neg Hx     Social History   Social History  . Marital status: Widowed    Spouse name: N/A  . Number of children: 4  . Years of education: N/A   Occupational History  . retired Retired   Social History Main Topics  . Smoking status: Never Smoker  . Smokeless tobacco: Never Used  . Alcohol use No  . Drug use: No  . Sexual activity: Yes    Birth control/ protection: None     Comment: regular exercise, lives at the [facility], widowed in 2014   Other Topics Concern  . Not on file   Social History Narrative  . No narrative on file    Outpatient Medications Prior to Visit  Medication Sig Dispense Refill  . Ascorbic Acid (VITAMIN C) 1000 MG tablet Take 1,000 mg by mouth daily.      [name] Kitchen aspirin 81 MG tablet Take 81 mg by mouth daily.      [name] Kitchen  atenolol (TENORMIN) 25 MG tablet Take 0.5 tablets (12.5 mg total) by mouth daily. 45 tablet 1  . Calcium Carbonate-Vitamin D (CALCIUM PLUS VITAMIN D PO) Take 1,200 tablets by mouth daily.     [name] Kitchen conjugated estrogens (PREMARIN) vaginal cream Place 0.5 g vaginally once a week.     . fish oil-omega-3 fatty acids 1000 MG capsule Take 2 g by mouth daily.      . Glucosamine-Chondroit-Vit C-Mn (GLUCOSAMINE 1500 COMPLEX) CAPS Take 1,500 capsules by mouth 2 (two) times daily.      [name] Kitchen loperamide (IMODIUM) 2 MG capsule Take 2 mg by mouth 4 (four) times daily as needed. For diarrhea    . losartan  (COZAAR) 25 MG tablet TAKE 1 TABLET BY MOUTH  DAILY 90 tablet 1  . Multiple Vitamin (MULTIVITAMIN) tablet Take 1 tablet by mouth daily.      . simvastatin (ZOCOR) 10 MG tablet TAKE 1 TABLET BY MOUTH AT  BEDTIME 90 tablet 1  . benzonatate (TESSALON PERLES) 100 MG capsule Take 2 capsules (200 mg total) by mouth 2 (two) times daily as needed for cough. 20 capsule 0  . doxycycline (VIBRA-TABS) 100 MG tablet Take 1 tablet (100 mg total) by mouth 2 (two) times daily. 20 tablet 0  . Vitamin D, Ergocalciferol, (DRISDOL) 50000 units CAPS capsule Take 1 capsule (50,000 Units total) by mouth every 7 (seven) days. 12 capsule 0   No facility-administered medications prior to visit.     Allergies  Allergen Reactions  . Codeine     REACTION: Nausea  Vomiting    Review of Systems  Constitutional: Negative for chills, fever and malaise/fatigue.  HENT: Negative for congestion and hearing loss.   Eyes: Negative for discharge.  Respiratory: Negative for cough, sputum production and shortness of breath.   Cardiovascular: Negative for chest pain, palpitations and leg swelling.  Gastrointestinal: Negative for abdominal pain, blood in stool, constipation, diarrhea, heartburn, nausea and vomiting.  Genitourinary: Negative for dysuria, frequency, hematuria and urgency.  Musculoskeletal: Positive for back pain. Negative for falls and myalgias.  Skin: Negative for rash.  Neurological: Negative for dizziness, sensory change, loss of consciousness, weakness and headaches.  Endo/Heme/Allergies: Negative for environmental allergies. Does not bruise/bleed easily.  Psychiatric/Behavioral: Negative for depression and suicidal ideas. The patient is not nervous/anxious and does not have insomnia.        Objective:    Physical Exam  Constitutional: She is oriented to person, place, and time. She appears well-developed and well-nourished. No distress.  HENT:  Head: Normocephalic and atraumatic.  Eyes: Conjunctivae  are normal.  Neck: Neck supple. No thyromegaly present.  Cardiovascular: Normal rate, regular rhythm and normal heart sounds.   No murmur heard. Pulmonary/Chest: Effort normal and breath sounds normal. No respiratory distress.  Abdominal: Soft. Bowel sounds are normal. She exhibits no distension and no mass. There is no tenderness.  Musculoskeletal: She exhibits no edema.  Scoliosis in lumbar spine noted with left then right ward curves noted.   Lymphadenopathy:    She has no cervical adenopathy.  Neurological: She is alert and oriented to person, place, and time.  Skin: Skin is warm and dry.  Psychiatric: She has a normal mood and affect. Her behavior is normal.    BP 98/62 (BP Location: Left Arm, Patient Position: Sitting, Cuff Size: Normal)   Pulse 60   Temp 97.8 F (36.6 C) (Oral)   Resp 18   Wt 127 lb (57.6 kg)   SpO2 97%   BMI 21.80  kg/m  Wt Readings from Last 3 Encounters:  07/14/16 127 lb (57.6 kg)  06/03/16 124 lb (56.2 kg)  02/18/16 126 lb 4 oz (57.3 kg)   BP Readings from Last 3 Encounters:  07/14/16 98/62  06/03/16 (!) 165/96  02/18/16 (!) 160/95     Immunization History  Administered Date(s) Administered  . Influenza Split 12/08/2011, 12/05/2015  . Influenza Whole 12/11/2006, 12/14/2007, 12/26/2008, 11/06/2009, 11/11/2010  . Influenza, High Dose Seasonal PF 01/08/2015  . Influenza,inj,Quad PF,36+ Mos 11/12/2012, 12/30/2013  . Pneumococcal Conjugate-13 06/27/2013  . Pneumococcal Polysaccharide-23 03/03/2002, 10/06/2007  . Td 03/03/1997, 10/06/2007  . Tdap 09/27/2014  . Zoster 03/03/2014    Health Maintenance  Topic Date Due  . INFLUENZA VACCINE  10/01/2016  . MAMMOGRAM  01/06/2017  . TETANUS/TDAP  09/26/2024  . DEXA SCAN  Completed  . PNA vac Low Risk Adult  Completed    Lab Results  Component Value Date   WBC 7.5 01/10/2016   HGB 13.1 01/10/2016   HCT 39.2 01/10/2016   PLT 286.0 01/10/2016   GLUCOSE 111 (H) 01/10/2016   CHOL 174 01/10/2016     TRIG 79.0 01/10/2016   HDL 68.90 01/10/2016   LDLCALC 90 01/10/2016   ALT 20 01/10/2016   AST 28 01/10/2016   NA 139 01/10/2016   K 4.2 01/10/2016   CL 103 01/10/2016   CREATININE 1.03 01/10/2016   BUN 26 (H) 01/10/2016   CO2 30 01/10/2016   TSH 2.56 01/10/2016   HGBA1C 6.0 01/10/2016    Lab Results  Component Value Date   TSH 2.56 01/10/2016   Lab Results  Component Value Date   WBC 7.5 01/10/2016   HGB 13.1 01/10/2016   HCT 39.2 01/10/2016   MCV 95.9 01/10/2016   PLT 286.0 01/10/2016   Lab Results  Component Value Date   NA 139 01/10/2016   K 4.2 01/10/2016   CO2 30 01/10/2016   GLUCOSE 111 (H) 01/10/2016   BUN 26 (H) 01/10/2016   CREATININE 1.03 01/10/2016   BILITOT 1.2 01/10/2016   ALKPHOS 43 01/10/2016   AST 28 01/10/2016   ALT 20 01/10/2016   PROT 7.5 01/10/2016   ALBUMIN 4.3 01/10/2016   CALCIUM 9.9 01/10/2016   GFR 54.28 (L) 01/10/2016   Lab Results  Component Value Date   CHOL 174 01/10/2016   Lab Results  Component Value Date   HDL 68.90 01/10/2016   Lab Results  Component Value Date   LDLCALC 90 01/10/2016   Lab Results  Component Value Date   TRIG 79.0 01/10/2016   Lab Results  Component Value Date   CHOLHDL 3 01/10/2016   Lab Results  Component Value Date   HGBA1C 6.0 01/10/2016         Assessment & Plan:   Problem List Items Addressed This Visit    Hyperlipidemia    Tolerating statin, encouraged heart healthy diet, avoid trans fats, minimize simple carbs and saturated fats. Increase exercise as tolerated      Relevant Orders   Lipid panel   Essential hypertension    Well controlled, no changes to meds. Encouraged heart healthy diet such as the DASH diet and exercise as tolerated.       Relevant Orders   CBC   Comprehensive metabolic panel   TSH   Preventative health care    Patient encouraged to maintain heart healthy diet, regular exercise, adequate sleep. Consider daily probiotics. Take medications as  prescribed. Check labs today  Back pain    Right flank worse with use. When she works hard any given day doing hard work she has bad enough pain to keep her up at night. Has been using topical lidocaine gle with only partial relief. Used PT during bad back flare a couple years ago with a good response but has stopped those.       Relevant Orders   Vitamin D (25 hydroxy)   Hyperglycemia    minimize simple carbs. Increase exercise as tolerated.       Relevant Orders   Hemoglobin A1c   Vitamin D deficiency    Check level today      Relevant Orders   Vitamin D (25 hydroxy)      I have discontinued Ms. Kennan's Vitamin D (Ergocalciferol), doxycycline, and benzonatate. I am also having her maintain her aspirin, Calcium Carbonate-Vitamin D (CALCIUM PLUS VITAMIN D PO), multivitamin, fish oil-omega-3 fatty acids, GLUCOSAMINE 1500 COMPLEX, loperamide, vitamin C, conjugated estrogens, atenolol, simvastatin, and losartan.  No orders of the defined types were placed in this encounter.   CMA served as Education administrator during this visit. History, Physical and Plan performed by medical provider. Documentation and orders reviewed and attested to.  Penni Homans, MD

## 2016-07-14 NOTE — Assessment & Plan Note (Signed)
minimize simple carbs. Increase exercise as tolerated.  

## 2016-07-14 NOTE — Assessment & Plan Note (Signed)
Well controlled, no changes to meds. Encouraged heart healthy diet such as the DASH diet and exercise as tolerated.  °

## 2016-07-14 NOTE — Assessment & Plan Note (Signed)
Right flank worse with use. When she works hard any given day doing hard work she has bad enough pain to keep her up at night. Has been using topical lidocaine gle with only partial relief. Used PT during bad back flare a couple years ago with a good response but has stopped those.

## 2016-07-14 NOTE — Assessment & Plan Note (Signed)
Check level today 

## 2016-07-14 NOTE — Assessment & Plan Note (Signed)
Patient encouraged to maintain heart healthy diet, regular exercise, adequate sleep. Consider daily probiotics. Take medications as prescribed. Check labs today.  

## 2016-07-14 NOTE — Patient Instructions (Signed)
Preventive Care 65 Years and Older, Female Preventive care refers to lifestyle choices and visits with your health care provider that can promote health and wellness. What does preventive care include?  A yearly physical exam. This is also called an annual well check.  Dental exams once or twice a year.  Routine eye exams. Ask your health care provider how often you should have your eyes checked.  Personal lifestyle choices, including:  Daily care of your teeth and gums.  Regular physical activity.  Eating a healthy diet.  Avoiding tobacco and drug use.  Limiting alcohol use.  Practicing safe sex.  Taking low-dose aspirin every day.  Taking vitamin and mineral supplements as recommended by your health care provider. What happens during an annual well check? The services and screenings done by your health care provider during your annual well check will depend on your age, overall health, lifestyle risk factors, and family history of disease. Counseling  Your health care provider may ask you questions about your:  Alcohol use.  Tobacco use.  Drug use.  Emotional well-being.  Home and relationship well-being.  Sexual activity.  Eating habits.  History of falls.  Memory and ability to understand (cognition).  Work and work environment.  Reproductive health. Screening  You may have the following tests or measurements:  Height, weight, and BMI.  Blood pressure.  Lipid and cholesterol levels. These may be checked every 5 years, or more frequently if you are over 50 years old.  Skin check.  Lung cancer screening. You may have this screening every year starting at age 55 if you have a 30-pack-year history of smoking and currently smoke or have quit within the past 15 years.  Fecal occult blood test (FOBT) of the stool. You may have this test every year starting at age 50.  Flexible sigmoidoscopy or colonoscopy. You may have a sigmoidoscopy every 5 years or  a colonoscopy every 10 years starting at age 50.  Hepatitis C blood test.  Hepatitis B blood test.  Sexually transmitted disease (STD) testing.  Diabetes screening. This is done by checking your blood sugar (glucose) after you have not eaten for a while (fasting). You may have this done every 1-3 years.  Bone density scan. This is done to screen for osteoporosis. You may have this done starting at age 65.  Mammogram. This may be done every 1-2 years. Talk to your health care provider about how often you should have regular mammograms. Talk with your health care provider about your test results, treatment options, and if necessary, the need for more tests. Vaccines  Your health care provider may recommend certain vaccines, such as:  Influenza vaccine. This is recommended every year.  Tetanus, diphtheria, and acellular pertussis (Tdap, Td) vaccine. You may need a Td booster every 10 years.  Varicella vaccine. You may need this if you have not been vaccinated.  Zoster vaccine. You may need this after age 60.  Measles, mumps, and rubella (MMR) vaccine. You may need at least one dose of MMR if you were born in 1957 or later. You may also need a second dose.  Pneumococcal 13-valent conjugate (PCV13) vaccine. One dose is recommended after age 65.  Pneumococcal polysaccharide (PPSV23) vaccine. One dose is recommended after age 65.  Meningococcal vaccine. You may need this if you have certain conditions.  Hepatitis A vaccine. You may need this if you have certain conditions or if you travel or work in places where you may be exposed to   hepatitis A.  Hepatitis B vaccine. You may need this if you have certain conditions or if you travel or work in places where you may be exposed to hepatitis B.  Haemophilus influenzae type b (Hib) vaccine. You may need this if you have certain conditions. Talk to your health care provider about which screenings and vaccines you need and how often you need  them. This information is not intended to replace advice given to you by your health care provider. Make sure you discuss any questions you have with your health care provider. Document Released: 03/16/2015 Document Revised: 11/07/2015 Document Reviewed: 12/19/2014 Elsevier Interactive Patient Education  2017 [employer]  

## 2016-07-14 NOTE — Assessment & Plan Note (Signed)
Tolerating statin, encouraged heart healthy diet, avoid trans fats, minimize simple carbs and saturated fats. Increase exercise as tolerated 

## 2016-07-15 ENCOUNTER — Other Ambulatory Visit: Payer: Self-pay | Admitting: Family Medicine

## 2016-07-15 ENCOUNTER — Telehealth: Payer: Self-pay | Admitting: Emergency Medicine

## 2016-07-15 DIAGNOSIS — E785 Hyperlipidemia, unspecified: Secondary | ICD-10-CM

## 2016-07-15 DIAGNOSIS — E559 Vitamin D deficiency, unspecified: Secondary | ICD-10-CM

## 2016-07-15 LAB — VITAMIN D 25 HYDROXY (VIT D DEFICIENCY, FRACTURES): VITD: 101.04 ng/mL — AB (ref 30.00–100.00)

## 2016-07-15 NOTE — Telephone Encounter (Signed)
Patient already notified.   PC

## 2016-07-15 NOTE — Telephone Encounter (Signed)
Guill from [place] called Critical Vitamin D of 101. 07-15-16 @ 1505. KMP

## 2016-07-15 NOTE — Telephone Encounter (Signed)
Stop all vitamin D supplements and recheck in 3 months

## 2016-07-15 NOTE — Telephone Encounter (Signed)
CRITICAL VALUE STICKER  CRITICAL VALUE: 101.04 Vitamin D  RECEIVER (on-site recipient of call):Kristy  DATE & TIME NOTIFIED: 07/15/16  3:42pm  MESSENGER (representative from lab):  MD NOTIFIED: Charlett Blake  TIME OF NOTIFICATION:  RESPONSE:

## 2016-07-21 ENCOUNTER — Other Ambulatory Visit: Payer: Self-pay | Admitting: Family Medicine

## 2016-07-21 DIAGNOSIS — I10 Essential (primary) hypertension: Secondary | ICD-10-CM

## 2016-08-27 ENCOUNTER — Other Ambulatory Visit: Payer: Self-pay | Admitting: Family Medicine

## 2016-08-27 DIAGNOSIS — I10 Essential (primary) hypertension: Secondary | ICD-10-CM

## 2016-08-27 DIAGNOSIS — E785 Hyperlipidemia, unspecified: Secondary | ICD-10-CM

## 2016-09-02 ENCOUNTER — Ambulatory Visit: Payer: Medicare Other | Admitting: Podiatry

## 2016-10-22 ENCOUNTER — Encounter: Payer: Self-pay | Admitting: Family Medicine

## 2016-10-22 ENCOUNTER — Other Ambulatory Visit: Payer: Medicare Other

## 2016-10-22 ENCOUNTER — Ambulatory Visit (INDEPENDENT_AMBULATORY_CARE_PROVIDER_SITE_OTHER): Payer: Medicare Other | Admitting: Family Medicine

## 2016-10-22 VITALS — BP 130/76 | HR 73 | Temp 97.8°F | Ht 64.0 in | Wt 124.2 lb

## 2016-10-22 DIAGNOSIS — J069 Acute upper respiratory infection, unspecified: Secondary | ICD-10-CM

## 2016-10-22 DIAGNOSIS — B9789 Other viral agents as the cause of diseases classified elsewhere: Secondary | ICD-10-CM

## 2016-10-22 MED ORDER — BENZONATATE 100 MG PO CAPS: 100.0000 mg | ORAL_CAPSULE | Freq: Three times a day (TID) | ORAL | 0 refills | Status: DC | PRN

## 2016-10-22 MED FILL — BENZONATATE 100 MG CAP: 100 | 10 days supply | Qty: 30 | Fill #0

## 2016-10-22 NOTE — Patient Instructions (Addendum)
Continue to push fluids, practice good hand hygiene, and cover your mouth if you cough.  If you start having fevers, shaking or shortness of breath, seek immediate care.  Let us know if you need anything.  

## 2016-10-22 NOTE — Progress Notes (Signed)
Pre visit review using our clinic review tool, if applicable. No additional management support is needed unless otherwise documented below in the visit note. 

## 2016-10-22 NOTE — Progress Notes (Signed)
Chief Complaint  Patient presents with  . Sinusitis    Christine Henderson here for URI complaints.  Duration: 2 days  Associated symptoms: sinus congestion, rhinorrhea, sore throat and cough (largely dry) Denies: sinus pain, itchy watery eyes, ear pain, ear drainage, shortness of breath, myalgia and fevers/rigors Treatment to date: Mucinex DM x1 Sick contacts: No  ROS:  Const: Denies fevers HEENT: As noted in HPI Lungs: No SOB  Past Medical History:  Diagnosis Date  . Abdominal pain, unspecified site 06/26/2008  . ALLERGIC RHINITIS CAUSE UNSPECIFIED 02/02/2008  . Annual physical exam 12/26/2012  . Diarrhea 06/26/2008  . Fibrocystic breast disease   . Headache(784.0) 10/01/2006  . Hereditary and idiopathic peripheral neuropathy 01/10/2016  . HYPERLIPIDEMIA 10/01/2006  . HYPERTENSION 10/06/2007  . IBS (irritable bowel syndrome)   . Insomnia 11/14/2012  . Irritable bowel syndrome 09/10/2006  . Medicare annual wellness visit, subsequent 12/26/2012   Sees Dr Elinor Parkinson of opthamology.  MGM 5/14     . Metatarsal stress fracture 01/20/2016  . MIGRAINE HEADACHE 09/10/2006  . MVP (mitral valve prolapse)   . Onychomycosis 07/09/2014  . OSTEOPENIA 09/10/2006  . Osteopenia 11/12/2012  . Palpitations 09/10/2006  . PHOTOPHOBIA 07/20/2009  . SKIN TAG 11/09/2006  . VAGINITIS, ATROPHIC 10/01/2006  . Vitamin D deficiency 07/14/2016   Family History  Problem Relation Age of Onset  . Inflammatory bowel disease Sister   . Alzheimer's disease Sister   . Pancreatic cancer Maternal Grandfather   . Alzheimer's disease Mother   . Hyperlipidemia Son   . Obesity Son   . Hyperlipidemia Son   . Heart disease Paternal Uncle   . Coronary artery disease Neg Hx     BP 130/76 (BP Location: Left Arm, Patient Position: Sitting, Cuff Size: Normal)   Pulse 73   Temp 97.8 F (36.6 C) (Oral)   Ht 5\' 4"  (1.626 m)   Wt 124 lb 4 oz (56.4 kg)   SpO2 97%   BMI 21.33 kg/m  General: Awake, alert, appears stated  age HEENT: AT, [place], ears patent b/l and TM's neg, nares patent w/o discharge, pharynx pink and without exudates, MMM Neck: No masses or asymmetry Heart: RRR, no murmurs, no bruits Lungs: CTAB, no accessory muscle use Psych: Age appropriate judgment and insight, normal mood and affect  Viral URI with cough  Orders as above. Continue to push fluids, practice good hand hygiene, cover mouth when coughing. F/u prn. If starting to experience fevers, shaking, or shortness of breath, seek immediate care. Pt voiced understanding and agreement to the plan.  [place], DO 10/22/16 3:32 PM

## 2016-10-23 ENCOUNTER — Other Ambulatory Visit (INDEPENDENT_AMBULATORY_CARE_PROVIDER_SITE_OTHER): Payer: Medicare Other

## 2016-10-23 DIAGNOSIS — E785 Hyperlipidemia, unspecified: Secondary | ICD-10-CM | POA: Diagnosis not present

## 2016-10-23 DIAGNOSIS — E559 Vitamin D deficiency, unspecified: Secondary | ICD-10-CM

## 2016-10-23 LAB — CBC
HCT: 38.2 % (ref 36.0–46.0)
HEMOGLOBIN: 12.4 g/dL (ref 12.0–15.0)
MCHC: 32.6 g/dL (ref 30.0–36.0)
MCV: 99.6 fl (ref 78.0–100.0)
Platelets: 235 10*3/uL (ref 150.0–400.0)
RBC: 3.84 Mil/uL — AB (ref 3.87–5.11)
RDW: 14.2 % (ref 11.5–15.5)
WBC: 7.8 10*3/uL (ref 4.0–10.5)

## 2016-10-23 LAB — LIPID PANEL
Cholesterol: 164 mg/dL (ref 0–200)
HDL: 65.9 mg/dL (ref >39.00)
LDL Cholesterol: 85 mg/dL (ref 0–99)
NONHDL: 98.16
Total CHOL/HDL Ratio: 2
Triglycerides: 66 mg/dL (ref 0.0–149.0)
VLDL: 13.2 mg/dL (ref 0.0–40.0)

## 2016-10-23 LAB — COMPREHENSIVE METABOLIC PANEL
ALK PHOS: 42 U/L (ref 39–117)
ALT: 20 U/L (ref 0–35)
AST: 33 U/L (ref 0–37)
Albumin: 4.1 g/dL (ref 3.5–5.2)
BILIRUBIN TOTAL: 0.9 mg/dL (ref 0.2–1.2)
BUN: 22 mg/dL (ref 6–23)
CO2: 27 mEq/L (ref 19–32)
Calcium: 9.5 mg/dL (ref 8.4–10.5)
Chloride: 105 mEq/L (ref 96–112)
Creatinine, Ser: 1.19 mg/dL (ref 0.40–1.20)
GFR: 45.87 mL/min — ABNORMAL LOW (ref >60.00)
GLUCOSE: 133 mg/dL — AB (ref 70–99)
Potassium: 4.2 mEq/L (ref 3.5–5.1)
SODIUM: 139 meq/L (ref 135–145)
TOTAL PROTEIN: 7.4 g/dL (ref 6.0–8.3)

## 2016-10-23 LAB — VITAMIN D 25 HYDROXY (VIT D DEFICIENCY, FRACTURES): VITD: 53.89 ng/mL (ref 30.00–100.00)

## 2016-11-05 ENCOUNTER — Encounter: Payer: Self-pay | Admitting: Family Medicine

## 2016-11-05 ENCOUNTER — Ambulatory Visit (INDEPENDENT_AMBULATORY_CARE_PROVIDER_SITE_OTHER): Payer: Medicare Other | Admitting: Family Medicine

## 2016-11-05 VITALS — BP 132/84 | HR 92 | Temp 98.2°F | Ht 64.0 in | Wt 125.1 lb

## 2016-11-05 DIAGNOSIS — R058 Other specified cough: Secondary | ICD-10-CM

## 2016-11-05 DIAGNOSIS — R05 Cough: Secondary | ICD-10-CM

## 2016-11-05 MED ORDER — BENZONATATE 100 MG PO CAPS: 100.0000 mg | ORAL_CAPSULE | Freq: Three times a day (TID) | ORAL | 0 refills | Status: DC | PRN

## 2016-11-05 MED FILL — BENZONATATE 100 MG CAP: 100 | 10 days supply | Qty: 30 | Fill #0

## 2016-11-05 NOTE — Patient Instructions (Addendum)
This can last 4-6 weeks after your sickness resolves.  If you have a change in symptoms or new things like fevers, shaking or shortness of breath that arises, seek care.   Honey may be helpful.  Throat lozenges may be helpful, but don't continue to use if not beneficial.   Keep pushing fluids.  Let us know if you need anything.

## 2016-11-05 NOTE — Progress Notes (Signed)
Chief Complaint  Patient presents with  . Cough    Christine Henderson here for URI complaints.  I saw her on 8.22.18 for viral URI with cough and rx'd her supportive management. All of her sinus congestion/rhinorrhea has resolved, but she is having a lingering cough. No wheezing or SOB. She was rx'd Tessalon Perle's that were helpful. Denies ear/eye complaints, sinus issues, ST, CP, or fevers/rigors.   ROS:  Const: Denies fevers HEENT: As noted in HPI Lungs: No SOB  Past Medical History:  Diagnosis Date  . Abdominal pain, unspecified site 06/26/2008  . ALLERGIC RHINITIS CAUSE UNSPECIFIED 02/02/2008  . Annual physical exam 12/26/2012  . Diarrhea 06/26/2008  . Fibrocystic breast disease   . Headache(784.0) 10/01/2006  . Hereditary and idiopathic peripheral neuropathy 01/10/2016  . HYPERLIPIDEMIA 10/01/2006  . HYPERTENSION 10/06/2007  . IBS (irritable bowel syndrome)   . Insomnia 11/14/2012  . Irritable bowel syndrome 09/10/2006  . Medicare annual wellness visit, subsequent 12/26/2012   Sees Dr Elinor Parkinson of opthamology.  MGM 5/14     . Metatarsal stress fracture 01/20/2016  . MIGRAINE HEADACHE 09/10/2006  . MVP (mitral valve prolapse)   . Onychomycosis 07/09/2014  . OSTEOPENIA 09/10/2006  . Osteopenia 11/12/2012  . Palpitations 09/10/2006  . PHOTOPHOBIA 07/20/2009  . SKIN TAG 11/09/2006  . VAGINITIS, ATROPHIC 10/01/2006  . Vitamin D deficiency 07/14/2016   Family History  Problem Relation Age of Onset  . Inflammatory bowel disease Sister   . Alzheimer's disease Sister   . Pancreatic cancer Maternal Grandfather   . Alzheimer's disease Mother   . Hyperlipidemia Son   . Obesity Son   . Hyperlipidemia Son   . Heart disease Paternal Uncle   . Coronary artery disease Neg Hx     BP 132/84 (BP Location: Left Arm, Patient Position: Sitting, Cuff Size: Normal)   Pulse 92   Temp 98.2 F (36.8 C) (Oral)   Ht 5\' 4"  (1.626 m)   Wt 125 lb 2 oz (56.8 kg)   SpO2 93%   BMI 21.48 kg/m   General: Awake, alert, appears stated age HEENT: AT, [place], ears patent b/l and TM's neg, nares patent w/o discharge, pharynx pink and without exudates, MMM Neck: No masses or asymmetry Heart: RRR, no murmurs, no bruits Lungs: CTAB, no accessory muscle use Psych: Age appropriate judgment and insight, normal mood and affect  Post-viral cough syndrome - Plan: benzonatate (TESSALON) 100 MG capsule  Orders as above. Continue to push fluids, practice good hand hygiene, cover mouth when coughing. F/u prn. If starting to experience fevers, shaking, or shortness of breath, seek immediate care. Pt voiced understanding and agreement to the plan.  [place], DO 11/05/16 1:44 PM

## 2016-11-05 NOTE — Progress Notes (Signed)
Pre visit review using our clinic review tool, if applicable. No additional management support is needed unless otherwise documented below in the visit note. 

## 2017-01-07 DIAGNOSIS — Z1231 Encounter for screening mammogram for malignant neoplasm of breast: Secondary | ICD-10-CM | POA: Diagnosis not present

## 2017-01-07 LAB — HM MAMMOGRAPHY

## 2017-01-09 NOTE — Progress Notes (Signed)
Subjective:   Christine Henderson is a 83 y.o. female who presents for Medicare Annual (Subsequent) preventive examination.  Review of Systems:  No ROS.  Medicare Wellness Visit. Additional risk factors are reflected in the social history.  Sleep patterns: Difficulty falling asleep. Takes Melatonin 1mg .  Stays asleep for about 7-8 hrs.   Female:    Mammo- last 01/07/16-normal      Dexa scan-  Last 01/08/15: osteopenia          Objective:     Vitals: BP 136/80 (BP Location: Left Arm, Patient Position: Sitting, Cuff Size: Normal)   Wt 125 lb 9.6 oz (57 kg)   BMI 21.56 kg/m   Body mass index is 21.56 kg/m.   Tobacco Social History   Tobacco Use  Smoking Status Never Smoker  Smokeless Tobacco Never Used     Counseling given: Not Answered   Past Medical History:  Diagnosis Date  . Abdominal pain, unspecified site 06/26/2008  . ALLERGIC RHINITIS CAUSE UNSPECIFIED 02/02/2008  . Annual physical exam 12/26/2012  . Diarrhea 06/26/2008  . Fibrocystic breast disease   . Headache(784.0) 10/01/2006  . Hereditary and idiopathic peripheral neuropathy 01/10/2016  . HYPERLIPIDEMIA 10/01/2006  . HYPERTENSION 10/06/2007  . IBS (irritable bowel syndrome)   . Insomnia 11/14/2012  . Irritable bowel syndrome 09/10/2006  . Medicare annual wellness visit, subsequent 12/26/2012   Sees Dr Elinor Parkinson of opthamology.  MGM 5/14     . Metatarsal stress fracture 01/20/2016  . MIGRAINE HEADACHE 09/10/2006  . MVP (mitral valve prolapse)   . Onychomycosis 07/09/2014  . OSTEOPENIA 09/10/2006  . Osteopenia 11/12/2012  . Palpitations 09/10/2006  . PHOTOPHOBIA 07/20/2009  . SKIN TAG 11/09/2006  . VAGINITIS, ATROPHIC 10/01/2006  . Vitamin D deficiency 07/14/2016   Past Surgical History:  Procedure Laterality Date  . DENTAL SURGERY  09/2014  . ROTATOR CUFF REPAIR Right   . TONSILLECTOMY     Family History  Problem Relation Age of Onset  . Inflammatory bowel disease Sister   . Alzheimer's disease Sister     . Pancreatic cancer Maternal Grandfather   . Alzheimer's disease Mother   . Hyperlipidemia Son   . Obesity Son   . Hyperlipidemia Son   . Heart disease Paternal Uncle   . Coronary artery disease Neg Hx    Social History   Substance and Sexual Activity  Sexual Activity No  . Birth control/protection: None   Comment: regular exercise, lives at the [place], widowed in 2014    Outpatient Encounter Medications as of 01/13/2017  Medication Sig  . Ascorbic Acid (VITAMIN C) 1000 MG tablet Take 1,000 mg by mouth daily.    [name] Kitchen aspirin 81 MG tablet Take 81 mg by mouth daily.    [name] Kitchen atenolol (TENORMIN) 25 MG tablet TAKE ONE-HALF TABLET BY  MOUTH DAILY  . Glucosamine-Chondroit-Vit C-Mn (GLUCOSAMINE 1500 COMPLEX) CAPS Take 1,500 capsules by mouth 2 (two) times daily.    [name] Kitchen losartan (COZAAR) 25 MG tablet Take 1 tablet (25 mg total) by mouth daily.  . Multiple Vitamin (MULTIVITAMIN) tablet Take 1 tablet by mouth daily.    . simvastatin (ZOCOR) 10 MG tablet Take 1 tablet (10 mg total) by mouth at bedtime.  . benzonatate (TESSALON) 100 MG capsule Take 1 capsule (100 mg total) by mouth 3 (three) times daily as needed. (Patient not taking: Reported on 01/13/2017)  . Calcium Carbonate-Vitamin D (CALCIUM PLUS VITAMIN D PO) Take 1,200 tablets by mouth daily.   [name] Kitchen conjugated estrogens (  PREMARIN) vaginal cream Place 0.5 g vaginally once a week.   . fish oil-omega-3 fatty acids 1000 MG capsule Take 2 g by mouth daily.    [name] Kitchen loperamide (IMODIUM) 2 MG capsule Take 2 mg by mouth 4 (four) times daily as needed. For diarrhea   No facility-administered encounter medications on file as of 01/13/2017.     Activities of Daily Living In your present state of health, do you have any difficulty performing the following activities: 01/13/2017 07/14/2016  Hearing? N N  Vision? N N  Comment wearing glasses. Hx cataract sx. Dr.Mccuen yearly. -  Difficulty concentrating or making decisions? N N  Walking or climbing stairs?  N N  Dressing or bathing? N N  Doing errands, shopping? N N  Preparing Food and eating ? N -  Using the Toilet? N -  In the past six months, have you accidently leaked urine? N -  Do you have problems with loss of bowel control? N -  Managing your Medications? N -  Managing your Finances? N -  Housekeeping or managing your Housekeeping? N -  Some recent data might be hidden    Patient Care Team: Mosie Lukes, MD as PCP - General (Family Medicine)    Assessment:    Physical assessment deferred to PCP.  Exercise Activities and Dietary recommendations Current Exercise Habits: The patient does not participate in regular exercise at present, Exercise limited by: None identified Diet (meal preparation, eat out, water intake, caffeinated beverages, dairy products, fruits and vegetables): 24 hour recall Breakfast: cereal, hot tea, juice. Lunch: Bean soup, yogurt.  Dinner:  Cristy Friedlander and salad and breadstick. Tea. . Goal: Maintain current health and independence. Begin walking daily again  Fall Risk Fall Risk  01/13/2017 07/14/2016 01/10/2016 01/08/2015 12/30/2013  Falls in the past year? No No No Yes No  Number falls in past yr: - - - 1 -  Injury with Fall? - - - Yes -  Comment - - - Bruises and injury to her teeth and stitches in upper lip. -  Risk for fall due to : - - - (No Data) -  Risk for fall due to: Comment - - - patient does not have a history of falling -   Depression Screen PHQ 2/9 Scores 01/13/2017 07/14/2016 01/10/2016 01/08/2015  PHQ - 2 Score 0 0 0 0     Cognitive Function MMSE - Mini Mental State Exam 01/13/2017  Orientation to time 5  Orientation to Place 5  Registration 3  Attention/ Calculation 5  Recall 3  Language- name 2 objects 2  Language- repeat 1  Language- follow 3 step command 3  Language- read & follow direction 1  Write a sentence 1  Copy design 1  Total score 30        Immunization History  Administered Date(s) Administered  . Influenza  Split 12/08/2011, 12/05/2015  . Influenza Whole 12/11/2006, 12/14/2007, 12/26/2008, 11/06/2009, 11/11/2010  . Influenza, High Dose Seasonal PF 01/08/2015, 12/04/2016  . Influenza,inj,Quad PF,6+ Mos 11/12/2012, 12/30/2013  . Pneumococcal Conjugate-13 06/27/2013  . Pneumococcal Polysaccharide-23 03/03/2002, 10/06/2007  . Td 03/03/1997, 10/06/2007  . Tdap 09/27/2014  . Zoster 03/03/2014  . Zoster Recombinat (Shingrix) 01/07/2017   Screening Tests Health Maintenance  Topic Date Due  . MAMMOGRAM  01/06/2017  . TETANUS/TDAP  09/26/2024  . INFLUENZA VACCINE  Completed  . DEXA SCAN  Completed  . PNA vac Low Risk Adult  Completed      Plan:   Please  schedule appointment to follow up with Dr.Blyth prior to leaving today.  Continue to eat heart healthy diet (full of fruits, vegetables, whole grains, lean protein, water--limit salt, fat, and sugar intake) and increase physical activity as tolerated.  Continue doing brain stimulating activities (puzzles, reading, adult coloring books, staying active) to keep memory sharp.     I have personally reviewed and noted the following in the patient's chart:   . Medical and social history . Use of alcohol, tobacco or illicit drugs  . Current medications and supplements . Functional ability and status . Nutritional status . Physical activity . Advanced directives . List of other physicians . Hospitalizations, surgeries, and ER visits in previous 12 months . Vitals . Screenings to include cognitive, depression, and falls . Referrals and appointments  In addition, I have reviewed and discussed with patient certain preventive protocols, quality metrics, and best practice recommendations. A written personalized care plan for preventive services as well as general preventive health recommendations were provided to patient.     Shela Nevin, [place]  01/13/2017

## 2017-01-13 ENCOUNTER — Encounter: Payer: Self-pay | Admitting: *Deleted

## 2017-01-13 ENCOUNTER — Ambulatory Visit (INDEPENDENT_AMBULATORY_CARE_PROVIDER_SITE_OTHER): Payer: Medicare Other | Admitting: *Deleted

## 2017-01-13 VITALS — BP 136/80 | HR 63 | Wt 125.6 lb

## 2017-01-13 DIAGNOSIS — Z0000 Encounter for general adult medical examination without abnormal findings: Secondary | ICD-10-CM

## 2017-01-13 DIAGNOSIS — Z780 Asymptomatic menopausal state: Secondary | ICD-10-CM

## 2017-01-13 NOTE — Patient Instructions (Addendum)
Christine Henderson , Thank you for taking time to come for your Medicare Wellness Visit. I appreciate your ongoing commitment to your health goals. Please review the following plan we discussed and let me know if I can assist you in the future.   These are the goals we discussed: Maintain current health and independence. Begin walking daily again   This is a list of the screening recommended for you and due dates:  Health Maintenance  Topic Date Due  . Mammogram  01/06/2017  . Tetanus Vaccine  09/26/2024  . Flu Shot  Completed  . DEXA scan (bone density measurement)  Completed  . Pneumonia vaccines  Completed    Please schedule appointment to follow up with Dr.Blyth prior to leaving today.  Continue to eat heart healthy diet (full of fruits, vegetables, whole grains, lean protein, water--limit salt, fat, and sugar intake) and increase physical activity as tolerated.  Continue doing brain stimulating activities (puzzles, reading, adult coloring books, staying active) to keep memory sharp.    Health Maintenance for Postmenopausal Women Menopause is a normal process in which your reproductive ability comes to an end. This process happens gradually over a span of months to years, usually between the ages of 73 and 82. Menopause is complete when you have missed 12 consecutive menstrual periods. It is important to talk with your health care provider about some of the most common conditions that affect postmenopausal women, such as heart disease, cancer, and bone loss (osteoporosis). Adopting a healthy lifestyle and getting preventive care can help to promote your health and wellness. Those actions can also lower your chances of developing some of these common conditions. What should I know about menopause? During menopause, you may experience a number of symptoms, such as:  Moderate-to-severe hot flashes.  Night sweats.  Decrease in sex drive.  Mood  swings.  Headaches.  Tiredness.  Irritability.  Memory problems.  Insomnia.  Choosing to treat or not to treat menopausal changes is an individual decision that you make with your health care provider. What should I know about hormone replacement therapy and supplements? Hormone therapy products are effective for treating symptoms that are associated with menopause, such as hot flashes and night sweats. Hormone replacement carries certain risks, especially as you become older. If you are thinking about using estrogen or estrogen with progestin treatments, discuss the benefits and risks with your health care provider. What should I know about heart disease and stroke? Heart disease, heart attack, and stroke become more likely as you age. This may be due, in part, to the hormonal changes that your body experiences during menopause. These can affect how your body processes dietary fats, triglycerides, and cholesterol. Heart attack and stroke are both medical emergencies. There are many things that you can do to help prevent heart disease and stroke:  Have your blood pressure checked at least every 1-2 years. High blood pressure causes heart disease and increases the risk of stroke.  If you are 39-73 years old, ask your health care provider if you should take aspirin to prevent a heart attack or a stroke.  Do not use any tobacco products, including cigarettes, chewing tobacco, or electronic cigarettes. If you need help quitting, ask your health care provider.  It is important to eat a healthy diet and maintain a healthy weight. ? Be sure to include plenty of vegetables, fruits, low-fat dairy products, and lean protein. ? Avoid eating foods that are high in solid fats, added sugars, or salt (  sodium).  Get regular exercise. This is one of the most important things that you can do for your health. ? Try to exercise for at least 150 minutes each week. The type of exercise that you do should  increase your heart rate and make you sweat. This is known as moderate-intensity exercise. ? Try to do strengthening exercises at least twice each week. Do these in addition to the moderate-intensity exercise.  Know your numbers.Ask your health care provider to check your cholesterol and your blood glucose. Continue to have your blood tested as directed by your health care provider.  What should I know about cancer screening? There are several types of cancer. Take the following steps to reduce your risk and to catch any cancer development as early as possible. Breast Cancer  Practice breast self-awareness. ? This means understanding how your breasts normally appear and feel. ? It also means doing regular breast self-exams. Let your health care provider know about any changes, no matter how small.  If you are 57 or older, have a clinician do a breast exam (clinical breast exam or CBE) every year. Depending on your age, family history, and medical history, it may be recommended that you also have a yearly breast X-ray (mammogram).  If you have a family history of breast cancer, talk with your health care provider about genetic screening.  If you are at high risk for breast cancer, talk with your health care provider about having an MRI and a mammogram every year.  Breast cancer (BRCA) gene test is recommended for women who have family members with BRCA-related cancers. Results of the assessment will determine the need for genetic counseling and BRCA1 and for BRCA2 testing. BRCA-related cancers include these types: ? Breast. This occurs in males or females. ? Ovarian. ? Tubal. This may also be called fallopian tube cancer. ? Cancer of the abdominal or pelvic lining (peritoneal cancer). ? Prostate. ? Pancreatic.  Cervical, Uterine, and Ovarian Cancer Your health care provider may recommend that you be screened regularly for cancer of the pelvic organs. These include your ovaries, uterus,  and vagina. This screening involves a pelvic exam, which includes checking for microscopic changes to the surface of your cervix (Pap test).  For women ages 21-65, health care providers may recommend a pelvic exam and a Pap test every three years. For women ages 94-65, they may recommend the Pap test and pelvic exam, combined with testing for human papilloma virus (HPV), every five years. Some types of HPV increase your risk of cervical cancer. Testing for HPV may also be done on women of any age who have unclear Pap test results.  Other health care providers may not recommend any screening for nonpregnant women who are considered low risk for pelvic cancer and have no symptoms. Ask your health care provider if a screening pelvic exam is right for you.  If you have had past treatment for cervical cancer or a condition that could lead to cancer, you need Pap tests and screening for cancer for at least 20 years after your treatment. If Pap tests have been discontinued for you, your risk factors (such as having a new sexual partner) need to be reassessed to determine if you should start having screenings again. Some women have medical problems that increase the chance of getting cervical cancer. In these cases, your health care provider may recommend that you have screening and Pap tests more often.  If you have a family history of uterine cancer  or ovarian cancer, talk with your health care provider about genetic screening.  If you have vaginal bleeding after reaching menopause, tell your health care provider.  There are currently no reliable tests available to screen for ovarian cancer.  Lung Cancer Lung cancer screening is recommended for adults 76-86 years old who are at high risk for lung cancer because of a history of smoking. A yearly low-dose CT scan of the lungs is recommended if you:  Currently smoke.  Have a history of at least 30 pack-years of smoking and you currently smoke or have quit  within the past 15 years. A pack-year is smoking an average of one pack of cigarettes per day for one year.  Yearly screening should:  Continue until it has been 15 years since you quit.  Stop if you develop a health problem that would prevent you from having lung cancer treatment.  Colorectal Cancer  This type of cancer can be detected and can often be prevented.  Routine colorectal cancer screening usually begins at age 48 and continues through age 59.  If you have risk factors for colon cancer, your health care provider may recommend that you be screened at an earlier age.  If you have a family history of colorectal cancer, talk with your health care provider about genetic screening.  Your health care provider may also recommend using home test kits to check for hidden blood in your stool.  A small camera at the end of a tube can be used to examine your colon directly (sigmoidoscopy or colonoscopy). This is done to check for the earliest forms of colorectal cancer.  Direct examination of the colon should be repeated every 5-10 years until age 91. However, if early forms of precancerous polyps or small growths are found or if you have a family history or genetic risk for colorectal cancer, you may need to be screened more often.  Skin Cancer  Check your skin from head to toe regularly.  Monitor any moles. Be sure to tell your health care provider: ? About any new moles or changes in moles, especially if there is a change in a mole's shape or color. ? If you have a mole that is larger than the size of a pencil eraser.  If any of your family members has a history of skin cancer, especially at a young age, talk with your health care provider about genetic screening.  Always use sunscreen. Apply sunscreen liberally and repeatedly throughout the day.  Whenever you are outside, protect yourself by wearing long sleeves, pants, a wide-brimmed hat, and sunglasses.  What should I know  about osteoporosis? Osteoporosis is a condition in which bone destruction happens more quickly than new bone creation. After menopause, you may be at an increased risk for osteoporosis. To help prevent osteoporosis or the bone fractures that can happen because of osteoporosis, the following is recommended:  If you are 27-41 years old, get at least 1,000 mg of calcium and at least 600 mg of vitamin D per day.  If you are older than age 56 but younger than age 83, get at least 1,200 mg of calcium and at least 600 mg of vitamin D per day.  If you are older than age 65, get at least 1,200 mg of calcium and at least 800 mg of vitamin D per day.  Smoking and excessive alcohol intake increase the risk of osteoporosis. Eat foods that are rich in calcium and vitamin D, and do weight-bearing exercises  several times each week as directed by your health care provider. What should I know about how menopause affects my mental health? Depression may occur at any age, but it is more common as you become older. Common symptoms of depression include:  Low or sad mood.  Changes in sleep patterns.  Changes in appetite or eating patterns.  Feeling an overall lack of motivation or enjoyment of activities that you previously enjoyed.  Frequent crying spells.  Talk with your health care provider if you think that you are experiencing depression. What should I know about immunizations? It is important that you get and maintain your immunizations. These include:  Tetanus, diphtheria, and pertussis (Tdap) booster vaccine.  Influenza every year before the flu season begins.  Pneumonia vaccine.  Shingles vaccine.  Your health care provider may also recommend other immunizations. This information is not intended to replace advice given to you by your health care provider. Make sure you discuss any questions you have with your health care provider. Document Released: 04/11/2005 Document Revised: 09/07/2015  Document Reviewed: 11/21/2014 Elsevier Interactive Patient Education  2018 [employer].

## 2017-01-14 ENCOUNTER — Telehealth: Payer: Self-pay | Admitting: Family Medicine

## 2017-01-14 NOTE — Telephone Encounter (Signed)
Pt is not scheduled on 01/19/17 at 11:15 but there is a hold on that slot with the exception stating pt name and mrn.   Session limit is exceeded that day. Does dr Charlett Blake want to see pt at 11:15 on 01/19/17?  Call pt to confirm or reschedule this visit.

## 2017-01-16 NOTE — Telephone Encounter (Signed)
No we can not

## 2017-01-19 ENCOUNTER — Ambulatory Visit (HOSPITAL_BASED_OUTPATIENT_CLINIC_OR_DEPARTMENT_OTHER)
Admission: RE | Admit: 2017-01-19 | Discharge: 2017-01-19 | Disposition: A | Discharge status: Home / Self Care | Payer: Medicare Other | Source: Ambulatory Visit | Attending: Family Medicine | Admitting: Family Medicine

## 2017-01-19 DIAGNOSIS — Z780 Asymptomatic menopausal state: Secondary | ICD-10-CM

## 2017-01-19 DIAGNOSIS — M8588 Other specified disorders of bone density and structure, other site: Secondary | ICD-10-CM | POA: Diagnosis not present

## 2017-01-19 DIAGNOSIS — M8589 Other specified disorders of bone density and structure, multiple sites: Secondary | ICD-10-CM | POA: Diagnosis not present

## 2017-01-21 ENCOUNTER — Encounter: Payer: Self-pay | Admitting: Family Medicine

## 2017-02-18 ENCOUNTER — Other Ambulatory Visit: Payer: Self-pay | Admitting: Family Medicine

## 2017-02-18 DIAGNOSIS — I10 Essential (primary) hypertension: Secondary | ICD-10-CM

## 2017-02-18 DIAGNOSIS — E785 Hyperlipidemia, unspecified: Secondary | ICD-10-CM

## 2017-03-10 DIAGNOSIS — H26491 Other secondary cataract, right eye: Secondary | ICD-10-CM | POA: Diagnosis not present

## 2017-03-10 DIAGNOSIS — H52203 Unspecified astigmatism, bilateral: Secondary | ICD-10-CM | POA: Diagnosis not present

## 2017-03-10 DIAGNOSIS — D3131 Benign neoplasm of right choroid: Secondary | ICD-10-CM | POA: Diagnosis not present

## 2017-03-19 ENCOUNTER — Encounter: Payer: Self-pay | Admitting: *Deleted

## 2017-03-19 ENCOUNTER — Telehealth: Payer: Self-pay | Admitting: Family Medicine

## 2017-03-19 NOTE — Telephone Encounter (Signed)
This encounter was created in error - please disregard.

## 2017-03-19 NOTE — Telephone Encounter (Signed)
Called in wanting to know what to do about her Losartan being recalled.   I instructed her to call her pharmacy because they would check the batch and lot numbers to determine if she had medications that were part of the recall.   If so they would replace her Losartan with a new prescription .   She verbalized understanding and is going to call her pharmacy.

## 2017-05-19 ENCOUNTER — Telehealth: Payer: Self-pay | Admitting: Family Medicine

## 2017-05-19 NOTE — Telephone Encounter (Signed)
Call to pharmacy- they are going to send out the patient's prescriptions. Left message for patient that Rx will be sent out. Please ask for refills at next appointment.

## 2017-05-19 NOTE — Telephone Encounter (Addendum)
Copied from [place]. Topic: Quick Communication - Rx Refill/Question >> May 19, 2017 10:46 AM Synthia Innocent wrote: Medication:  atenolol (TENORMIN) 25 MG tablet,  losartan 25mg  and simvastatin 10mg    Has the patient contacted their pharmacy? Yes, unable to get a hold of them  (Agent: If no, request that the patient contact the pharmacy for the refill.)   Preferred Pharmacy (with phone number or street name): [facility]   Agent: Please be advised that RX refills may take up to 3 business days. We ask that you follow-up with your pharmacy.

## 2017-07-24 ENCOUNTER — Ambulatory Visit (INDEPENDENT_AMBULATORY_CARE_PROVIDER_SITE_OTHER): Payer: Medicare Other | Admitting: Family Medicine

## 2017-07-24 VITALS — BP 112/70 | HR 69 | Temp 97.9°F | Resp 18 | Wt 125.2 lb

## 2017-07-24 DIAGNOSIS — R739 Hyperglycemia, unspecified: Secondary | ICD-10-CM

## 2017-07-24 DIAGNOSIS — E785 Hyperlipidemia, unspecified: Secondary | ICD-10-CM | POA: Diagnosis not present

## 2017-07-24 DIAGNOSIS — I10 Essential (primary) hypertension: Secondary | ICD-10-CM | POA: Diagnosis not present

## 2017-07-24 DIAGNOSIS — Z0000 Encounter for general adult medical examination without abnormal findings: Secondary | ICD-10-CM | POA: Diagnosis not present

## 2017-07-24 DIAGNOSIS — E559 Vitamin D deficiency, unspecified: Secondary | ICD-10-CM | POA: Diagnosis not present

## 2017-07-24 LAB — COMPREHENSIVE METABOLIC PANEL
ALK PHOS: 40 U/L (ref 39–117)
ALT: 16 U/L (ref 0–35)
AST: 23 U/L (ref 0–37)
Albumin: 4.1 g/dL (ref 3.5–5.2)
BILIRUBIN TOTAL: 1.2 mg/dL (ref 0.2–1.2)
BUN: 33 mg/dL — AB (ref 6–23)
CO2: 28 mEq/L (ref 19–32)
CREATININE: 1.21 mg/dL — AB (ref 0.40–1.20)
Calcium: 9.5 mg/dL (ref 8.4–10.5)
Chloride: 102 mEq/L (ref 96–112)
GFR: 44.91 mL/min — ABNORMAL LOW (ref >60.00)
GLUCOSE: 134 mg/dL — AB (ref 70–99)
Potassium: 4.6 mEq/L (ref 3.5–5.1)
SODIUM: 139 meq/L (ref 135–145)
Total Protein: 6.8 g/dL (ref 6.0–8.3)

## 2017-07-24 LAB — LIPID PANEL
CHOL/HDL RATIO: 3
Cholesterol: 156 mg/dL (ref 0–200)
HDL: 61.3 mg/dL (ref >39.00)
LDL Cholesterol: 81 mg/dL (ref 0–99)
NONHDL: 95.14
Triglycerides: 71 mg/dL (ref 0.0–149.0)
VLDL: 14.2 mg/dL (ref 0.0–40.0)

## 2017-07-24 LAB — HEMOGLOBIN A1C: Hgb A1c MFr Bld: 6.2 % (ref 4.6–6.5)

## 2017-07-24 LAB — CBC
HEMATOCRIT: 39.5 % (ref 36.0–46.0)
Hemoglobin: 13.1 g/dL (ref 12.0–15.0)
MCHC: 33.2 g/dL (ref 30.0–36.0)
MCV: 97 fl (ref 78.0–100.0)
Platelets: 236 10*3/uL (ref 150.0–400.0)
RBC: 4.07 Mil/uL (ref 3.87–5.11)
RDW: 14.7 % (ref 11.5–15.5)
WBC: 6 10*3/uL (ref 4.0–10.5)

## 2017-07-24 LAB — VITAMIN D 25 HYDROXY (VIT D DEFICIENCY, FRACTURES): VITD: 67.01 ng/mL (ref 30.00–100.00)

## 2017-07-24 LAB — TSH: TSH: 3.15 u[IU]/mL (ref 0.35–4.50)

## 2017-07-24 MED ORDER — LOSARTAN POTASSIUM 25 MG PO TABS: 25.0000 mg | ORAL_TABLET | Freq: Every day | ORAL | 1 refills | Status: DC

## 2017-07-24 MED ORDER — ATENOLOL 25 MG PO TABS: 12.5000 mg | ORAL_TABLET | Freq: Every day | ORAL | 2 refills | Status: DC

## 2017-07-24 MED ORDER — SIMVASTATIN 10 MG PO TABS: 10.0000 mg | ORAL_TABLET | Freq: Every day | ORAL | 1 refills | Status: DC

## 2017-07-24 NOTE — Assessment & Plan Note (Signed)
Tolerating statin, encouraged heart healthy diet, avoid trans fats, minimize simple carbs and saturated fats. Increase exercise as tolerated 

## 2017-07-24 NOTE — Assessment & Plan Note (Signed)
Well controlled, no changes to meds. Encouraged heart healthy diet such as the DASH diet and exercise as tolerated.  °

## 2017-07-24 NOTE — Progress Notes (Signed)
Subjective:  I acted as a Education administrator for Dr. Charlett Blake. [place], [place]  Patient ID: Christine Henderson, female    DOB: 11-18-1930, 84 y.o.   MRN: [id number]  No chief complaint on file.   HPI  Patient is in today for a follow up and she reports she feels well. No recent febrile illness or hospitalizations. No acute concerns. She is eating well and she stays active. Denies CP/palp/SOB/HA/congestion/fevers/GI or GU c/o. Taking meds as prescribed  Patient Care Team: Mosie Lukes, MD as PCP - General (Family Medicine)   Past Medical History:  Diagnosis Date  . Abdominal pain, unspecified site 06/26/2008  . ALLERGIC RHINITIS CAUSE UNSPECIFIED 02/02/2008  . Annual physical exam 12/26/2012  . Diarrhea 06/26/2008  . Fibrocystic breast disease   . Headache(784.0) 10/01/2006  . Hereditary and idiopathic peripheral neuropathy 01/10/2016  . HYPERLIPIDEMIA 10/01/2006  . HYPERTENSION 10/06/2007  . IBS (irritable bowel syndrome)   . Insomnia 11/14/2012  . Irritable bowel syndrome 09/10/2006  . Medicare annual wellness visit, subsequent 12/26/2012   Sees Dr Elinor Parkinson of opthamology.  MGM 5/14     . Metatarsal stress fracture 01/20/2016  . MIGRAINE HEADACHE 09/10/2006  . MVP (mitral valve prolapse)   . Onychomycosis 07/09/2014  . OSTEOPENIA 09/10/2006  . Osteopenia 11/12/2012  . Palpitations 09/10/2006  . PHOTOPHOBIA 07/20/2009  . SKIN TAG 11/09/2006  . VAGINITIS, ATROPHIC 10/01/2006  . Vitamin D deficiency 07/14/2016    Past Surgical History:  Procedure Laterality Date  . DENTAL SURGERY  09/2014  . ROTATOR CUFF REPAIR Right   . TONSILLECTOMY      Family History  Problem Relation Age of Onset  . Inflammatory bowel disease Sister   . Alzheimer's disease Sister   . Pancreatic cancer Maternal Grandfather   . Alzheimer's disease Mother   . Hyperlipidemia Son   . Obesity Son   . Hyperlipidemia Son   . Heart disease Paternal Uncle   . Coronary artery disease Neg Hx     Social History    Socioeconomic History  . Marital status: Widowed    Spouse name: Not on file  . Number of children: 4  . Years of education: Not on file  . Highest education level: Not on file  Occupational History  . Occupation: retired    Fish farm manager: RETIRED  Social Needs  . Financial resource strain: Not on file  . Food insecurity:    Worry: Not on file    Inability: Not on file  . Transportation needs:    Medical: Not on file    Non-medical: Not on file  Tobacco Use  . Smoking status: Never Smoker  . Smokeless tobacco: Never Used  Substance and Sexual Activity  . Alcohol use: No  . Drug use: No  . Sexual activity: Never    Birth control/protection: None    Comment: regular exercise, lives at the [place], widowed in 2014  Lifestyle  . Physical activity:    Days per week: Not on file    Minutes per session: Not on file  . Stress: Not on file  Relationships  . Social connections:    Talks on phone: Not on file    Gets together: Not on file    Attends religious service: Not on file    Active member of club or organization: Not on file    Attends meetings of clubs or organizations: Not on file    Relationship status: Not on file  . Intimate partner violence:  Fear of current or ex partner: Not on file    Emotionally abused: Not on file    Physically abused: Not on file    Forced sexual activity: Not on file  Other Topics Concern  . Not on file  Social History Narrative  . Not on file    Outpatient Medications Prior to Visit  Medication Sig Dispense Refill  . Ascorbic Acid (VITAMIN C) 1000 MG tablet Take 1,000 mg by mouth daily.      [name] Kitchen aspirin 81 MG tablet Take 81 mg by mouth daily.      . Calcium Carbonate-Vitamin D (CALCIUM PLUS VITAMIN D PO) Take 1,200 tablets by mouth daily.     [name] Kitchen conjugated estrogens (PREMARIN) vaginal cream Place 0.5 g vaginally once a week.     . fish oil-omega-3 fatty acids 1000 MG capsule Take 2 g by mouth daily.      . Glucosamine-Chondroit-Vit  C-Mn (GLUCOSAMINE 1500 COMPLEX) CAPS Take 1,500 capsules by mouth 2 (two) times daily.      [name] Kitchen loperamide (IMODIUM) 2 MG capsule Take 2 mg by mouth 4 (four) times daily as needed. For diarrhea    . Multiple Vitamin (MULTIVITAMIN) tablet Take 1 tablet by mouth daily.      [name] Kitchen atenolol (TENORMIN) 25 MG tablet TAKE ONE-HALF TABLET BY  MOUTH DAILY 45 tablet 2  . losartan (COZAAR) 25 MG tablet TAKE 1 TABLET BY MOUTH  DAILY 90 tablet 1  . simvastatin (ZOCOR) 10 MG tablet TAKE 1 TABLET BY MOUTH AT  BEDTIME 90 tablet 1  . benzonatate (TESSALON) 100 MG capsule Take 1 capsule (100 mg total) by mouth 3 (three) times daily as needed. (Patient not taking: Reported on 01/13/2017) 30 capsule 0   No facility-administered medications prior to visit.     Allergies  Allergen Reactions  . Codeine     REACTION: Nausea  Vomiting    Review of Systems  Constitutional: Negative for fever and malaise/fatigue.  HENT: Negative for congestion.   Eyes: Negative for blurred vision.  Respiratory: Negative for shortness of breath.   Cardiovascular: Negative for chest pain, palpitations and leg swelling.  Gastrointestinal: Negative for abdominal pain, blood in stool and nausea.  Genitourinary: Negative for dysuria and frequency.  Musculoskeletal: Negative for falls.  Skin: Negative for rash.  Neurological: Negative for dizziness, loss of consciousness and headaches.  Endo/Heme/Allergies: Negative for environmental allergies.  Psychiatric/Behavioral: Negative for depression. The patient is not nervous/anxious.        Objective:    Physical Exam  Constitutional: She is oriented to person, place, and time. She appears well-developed and well-nourished. No distress.  HENT:  Head: Normocephalic and atraumatic.  Nose: Nose normal.  Eyes: Right eye exhibits no discharge. Left eye exhibits no discharge.  Neck: Normal range of motion. Neck supple.  Cardiovascular: Normal rate and regular rhythm.  No murmur  heard. Pulmonary/Chest: Effort normal and breath sounds normal.  Abdominal: Soft. Bowel sounds are normal. There is no tenderness.  Musculoskeletal: She exhibits no edema.  Neurological: She is alert and oriented to person, place, and time.  Skin: Skin is warm and dry.  Psychiatric: She has a normal mood and affect.  Nursing note and vitals reviewed.   BP 112/70 (BP Location: Left Arm, Patient Position: Sitting, Cuff Size: Normal)   Pulse 69   Temp 97.9 F (36.6 C) (Oral)   Resp 18   Wt 125 lb 3.2 oz (56.8 kg)   SpO2 97%   BMI 21.49 kg/m  Wt Readings from Last 3 Encounters:  07/24/17 125 lb 3.2 oz (56.8 kg)  01/13/17 125 lb 9.6 oz (57 kg)  11/05/16 125 lb 2 oz (56.8 kg)   BP Readings from Last 3 Encounters:  07/24/17 112/70  01/13/17 136/80  11/05/16 132/84     Immunization History  Administered Date(s) Administered  . Influenza Split 12/08/2011, 12/05/2015  . Influenza Whole 12/11/2006, 12/14/2007, 12/26/2008, 11/06/2009, 11/11/2010  . Influenza, High Dose Seasonal PF 01/08/2015, 12/04/2016  . Influenza,inj,Quad PF,6+ Mos 11/12/2012, 12/30/2013  . Pneumococcal Conjugate-13 06/27/2013  . Pneumococcal Polysaccharide-23 03/03/2002, 10/06/2007  . Td 03/03/1997, 10/06/2007  . Tdap 09/27/2014  . Zoster 03/03/2014  . Zoster Recombinat (Shingrix) 01/07/2017    Health Maintenance  Topic Date Due  . INFLUENZA VACCINE  10/01/2017  . MAMMOGRAM  01/07/2018  . TETANUS/TDAP  09/26/2024  . DEXA SCAN  Completed  . PNA vac Low Risk Adult  Completed    Lab Results  Component Value Date   WBC 7.8 10/23/2016   HGB 12.4 10/23/2016   HCT 38.2 10/23/2016   PLT 235.0 10/23/2016   GLUCOSE 133 (H) 10/23/2016   CHOL 164 10/23/2016   TRIG 66.0 10/23/2016   HDL 65.90 10/23/2016   LDLCALC 85 10/23/2016   ALT 20 10/23/2016   AST 33 10/23/2016   NA 139 10/23/2016   K 4.2 10/23/2016   CL 105 10/23/2016   CREATININE 1.19 10/23/2016   BUN 22 10/23/2016   CO2 27 10/23/2016   TSH  2.34 07/14/2016   HGBA1C 6.2 07/14/2016    Lab Results  Component Value Date   TSH 2.34 07/14/2016   Lab Results  Component Value Date   WBC 7.8 10/23/2016   HGB 12.4 10/23/2016   HCT 38.2 10/23/2016   MCV 99.6 10/23/2016   PLT 235.0 10/23/2016   Lab Results  Component Value Date   NA 139 10/23/2016   K 4.2 10/23/2016   CO2 27 10/23/2016   GLUCOSE 133 (H) 10/23/2016   BUN 22 10/23/2016   CREATININE 1.19 10/23/2016   BILITOT 0.9 10/23/2016   ALKPHOS 42 10/23/2016   AST 33 10/23/2016   ALT 20 10/23/2016   PROT 7.4 10/23/2016   ALBUMIN 4.1 10/23/2016   CALCIUM 9.5 10/23/2016   GFR 45.87 (L) 10/23/2016   Lab Results  Component Value Date   CHOL 164 10/23/2016   Lab Results  Component Value Date   HDL 65.90 10/23/2016   Lab Results  Component Value Date   LDLCALC 85 10/23/2016   Lab Results  Component Value Date   TRIG 66.0 10/23/2016   Lab Results  Component Value Date   CHOLHDL 2 10/23/2016   Lab Results  Component Value Date   HGBA1C 6.2 07/14/2016         Assessment & Plan:   Problem List Items Addressed This Visit    Hyperlipidemia    Tolerating statin, encouraged heart healthy diet, avoid trans fats, minimize simple carbs and saturated fats. Increase exercise as tolerated      Relevant Medications   losartan (COZAAR) 25 MG tablet   atenolol (TENORMIN) 25 MG tablet   simvastatin (ZOCOR) 10 MG tablet   Essential hypertension    Well controlled, no changes to meds. Encouraged heart healthy diet such as the DASH diet and exercise as tolerated.       Relevant Medications   losartan (COZAAR) 25 MG tablet   atenolol (TENORMIN) 25 MG tablet   simvastatin (ZOCOR) 10 MG tablet   Preventative  health care    Has gotten both shingrix shots. We have record of her first one in November 2018 will request the records from [facility] in [facility] where she got the second      Hyperglycemia    hgba1c acceptable, minimize simple carbs. Increase exercise  as tolerated.       Vitamin D deficiency    Check level, take daily supplements         I have discontinued Leona Singleton. Debenedetto's benzonatate. I have also changed her losartan, atenolol, and simvastatin. Additionally, I am having her maintain her aspirin, Calcium Carbonate-Vitamin D (CALCIUM PLUS VITAMIN D PO), multivitamin, fish oil-omega-3 fatty acids, GLUCOSAMINE 1500 COMPLEX, loperamide, vitamin C, and conjugated estrogens.  Meds ordered this encounter  Medications  . losartan (COZAAR) 25 MG tablet    Sig: Take 1 tablet (25 mg total) by mouth daily.    Dispense:  90 tablet    Refill:  1  . atenolol (TENORMIN) 25 MG tablet    Sig: Take 0.5 tablets (12.5 mg total) by mouth daily.    Dispense:  45 tablet    Refill:  2  . simvastatin (ZOCOR) 10 MG tablet    Sig: Take 1 tablet (10 mg total) by mouth at bedtime.    Dispense:  90 tablet    Refill:  1    CMA served as Education administrator during this visit. History, Physical and Plan performed by medical provider. Documentation and orders reviewed and attested to.  Penni Homans, MD

## 2017-07-24 NOTE — Assessment & Plan Note (Signed)
Check level, take daily supplements

## 2017-07-24 NOTE — Assessment & Plan Note (Signed)
hgba1c acceptable, minimize simple carbs. Increase exercise as tolerated.  

## 2017-07-24 NOTE — Assessment & Plan Note (Signed)
Has gotten both shingrix shots. We have record of her first one in November 2018 will request the records from [facility] in [facility] where she got the second

## 2017-07-24 NOTE — Patient Instructions (Signed)

## 2017-12-03 ENCOUNTER — Other Ambulatory Visit: Payer: Self-pay

## 2017-12-03 NOTE — Patient Outreach (Signed)
[facility]) Care Management  12/03/2017  Christine Henderson 05-29-1930 [id number]   Medication Adherence call to Mrs. Christine Henderson patient did not answer patient is due on Simvastatin 10 mg and Losartan 25 mg. Mrs. Christine Henderson is showing past due under [name].   [facility] Direct Dial [phone]  Fax [phone] [email or web address]

## 2017-12-28 ENCOUNTER — Other Ambulatory Visit: Payer: Self-pay | Admitting: Family Medicine

## 2017-12-28 DIAGNOSIS — I10 Essential (primary) hypertension: Secondary | ICD-10-CM

## 2018-01-08 DIAGNOSIS — Z1231 Encounter for screening mammogram for malignant neoplasm of breast: Secondary | ICD-10-CM | POA: Diagnosis not present

## 2018-01-08 LAB — HM MAMMOGRAPHY

## 2018-01-19 ENCOUNTER — Other Ambulatory Visit: Payer: Self-pay | Admitting: Family Medicine

## 2018-01-19 ENCOUNTER — Encounter: Payer: Self-pay | Admitting: Family Medicine

## 2018-01-19 DIAGNOSIS — E785 Hyperlipidemia, unspecified: Secondary | ICD-10-CM

## 2018-03-12 NOTE — Progress Notes (Addendum)
Subjective:   Christine Henderson is a 85 y.o. female who presents for Medicare Annual/Subsequent preventive examination.  Review of Systems: No ROS.  Medicare Wellness Visit. Additional risk factors are reflected in the social history. Cardiac Risk Factors include: advanced age (>50men, >1 women);dyslipidemia;hypertension Sleep patterns:  Takes Melatonin 1 mg and sleeps 7-8 hrs.  Home Safety/Smoke Alarms: Feels safe in home. Smoke alarms in place. Lives alone in 1 story home. Walk-in shower. Wears medical alert bracelet.   Eye- [place] yearly. Next appt. Wed.  Female:         Mammo-  01/08/18     Dexa scan-  01/19/17     Objective:    Vitals: BP 138/82 (BP Location: Left Arm, Patient Position: Sitting, Cuff Size: Normal)   Pulse 77   Ht 5\' 4"  (1.626 m)   Wt 127 lb 3.2 oz (57.7 kg)   SpO2 98%   BMI 21.83 kg/m   Body mass index is 21.83 kg/m.  Advanced Directives 03/15/2018 01/13/2017 01/10/2016 12/30/2013  Does Patient Have a Medical Advance Directive? Yes Yes Yes Yes  Type of Paramedic of [place];Living will [place];Living will Living will [place];Living will  Does patient want to make changes to medical advance directive? No - Patient declined - No - Patient declined Yes - information given  Copy of [place] in Chart? Yes - validated most recent copy scanned in chart (See row information) Yes Yes No - copy requested    Tobacco Social History   Tobacco Use  Smoking Status Never Smoker  Smokeless Tobacco Never Used     Counseling given: Not Answered   Clinical Intake: Pain : No/denies pain     Past Medical History:  Diagnosis Date  . Abdominal pain, unspecified site 06/26/2008  . ALLERGIC RHINITIS CAUSE UNSPECIFIED 02/02/2008  . Annual physical exam 12/26/2012  . Diarrhea 06/26/2008  . Fibrocystic breast disease   . Headache(784.0) 10/01/2006  . Hereditary and idiopathic  peripheral neuropathy 01/10/2016  . HYPERLIPIDEMIA 10/01/2006  . HYPERTENSION 10/06/2007  . IBS (irritable bowel syndrome)   . Insomnia 11/14/2012  . Irritable bowel syndrome 09/10/2006  . Medicare annual wellness visit, subsequent 12/26/2012   Sees Dr Elinor Parkinson of opthamology.  MGM 5/14     . Metatarsal stress fracture 01/20/2016  . MIGRAINE HEADACHE 09/10/2006  . MVP (mitral valve prolapse)   . Onychomycosis 07/09/2014  . OSTEOPENIA 09/10/2006  . Osteopenia 11/12/2012  . Palpitations 09/10/2006  . PHOTOPHOBIA 07/20/2009  . SKIN TAG 11/09/2006  . VAGINITIS, ATROPHIC 10/01/2006  . Vitamin D deficiency 07/14/2016   Past Surgical History:  Procedure Laterality Date  . DENTAL SURGERY  09/2014  . ROTATOR CUFF REPAIR Right   . TONSILLECTOMY     Family History  Problem Relation Age of Onset  . Inflammatory bowel disease Sister   . Alzheimer's disease Sister   . Pancreatic cancer Maternal Grandfather   . Alzheimer's disease Mother   . Hyperlipidemia Son   . Obesity Son   . Hyperlipidemia Son   . Heart disease Paternal Uncle   . Coronary artery disease Neg Hx    Social History   Socioeconomic History  . Marital status: Widowed    Spouse name: Not on file  . Number of children: 4  . Years of education: Not on file  . Highest education level: Not on file  Occupational History  . Occupation: retired    Fish farm manager: RETIRED  Social Needs  . Financial resource strain: Not on file  . Food insecurity:    Worry: Not on file    Inability: Not on file  . Transportation needs:    Medical: Not on file    Non-medical: Not on file  Tobacco Use  . Smoking status: Never Smoker  . Smokeless tobacco: Never Used  Substance and Sexual Activity  . Alcohol use: No  . Drug use: No  . Sexual activity: Never    Birth control/protection: None    Comment: regular exercise, lives at the [place], widowed in 2014  Lifestyle  . Physical activity:    Days per week: Not on file    Minutes per  session: Not on file  . Stress: Not on file  Relationships  . Social connections:    Talks on phone: Not on file    Gets together: Not on file    Attends religious service: Not on file    Active member of club or organization: Not on file    Attends meetings of clubs or organizations: Not on file    Relationship status: Not on file  Other Topics Concern  . Not on file  Social History Narrative  . Not on file    Outpatient Encounter Medications as of 03/15/2018  Medication Sig  . Ascorbic Acid (VITAMIN C) 1000 MG tablet Take 1,000 mg by mouth daily.    [name] Kitchen aspirin 81 MG tablet Take 81 mg by mouth daily.    [name] Kitchen atenolol (TENORMIN) 25 MG tablet Take 0.5 tablets (12.5 mg total) by mouth daily.  . Calcium Carbonate-Vitamin D (CALCIUM PLUS VITAMIN D PO) Take 1,200 tablets by mouth daily.   . fish oil-omega-3 fatty acids 1000 MG capsule Take 2 g by mouth daily.    . Glucosamine-Chondroit-Vit C-Mn (GLUCOSAMINE 1500 COMPLEX) CAPS Take 1,500 capsules by mouth 2 (two) times daily.    [name] Kitchen loperamide (IMODIUM) 2 MG capsule Take 2 mg by mouth 4 (four) times daily as needed. For diarrhea  . losartan (COZAAR) 25 MG tablet TAKE 1 TABLET BY MOUTH  DAILY  . Multiple Vitamin (MULTIVITAMIN) tablet Take 1 tablet by mouth daily.    . simvastatin (ZOCOR) 10 MG tablet TAKE 1 TABLET BY MOUTH AT  BEDTIME  . conjugated estrogens (PREMARIN) vaginal cream Place 0.5 g vaginally once a week.    No facility-administered encounter medications on file as of 03/15/2018.     Activities of Daily Living In your present state of health, do you have any difficulty performing the following activities: 03/15/2018  Hearing? N  Vision? N  Comment wearing glasses.  Difficulty concentrating or making decisions? N  Walking or climbing stairs? N  Dressing or bathing? N  Doing errands, shopping? N  Preparing Food and eating ? N  Using the Toilet? N  In the past six months, have you accidently leaked urine? N  Do you have problems  with loss of bowel control? N  Managing your Medications? N  Managing your Finances? N  Housekeeping or managing your Housekeeping? N  Some recent data might be hidden    Patient Care Team: Mosie Lukes, MD as PCP - General (Family Medicine)   Assessment:   This is a routine wellness examination for [facility]. Physical assessment deferred to PCP.  Exercise Activities and Dietary recommendations Current Exercise Habits: The patient does not participate in regular exercise at present, Exercise limited by: None identified Diet (meal preparation, eat out, water intake, caffeinated beverages, dairy products,  fruits and vegetables): in general, a "healthy" diet  , well balanced  Goals    . Increase physical activity     Begin walking again.     . Maintain current health (pt-stated)       Fall Risk Fall Risk  03/15/2018 01/13/2017 07/14/2016 01/10/2016 01/08/2015  Falls in the past year? 0 No No No Yes  Number falls in past yr: - - - - 1  Injury with Fall? - - - - Yes  Comment - - - - Bruises and injury to her teeth and stitches in upper lip.  Risk for fall due to : - - - - (No Data)  Risk for fall due to: Comment - - - - patient does not have a history of falling   Depression Screen PHQ 2/9 Scores 03/15/2018 01/13/2017 07/14/2016 01/10/2016  PHQ - 2 Score 0 0 0 0    Cognitive Function Ad8 score reviewed for issues:  Issues making decisions:no  Less interest in hobbies / activities:no  Repeats questions, stories (family complaining):no  Trouble using ordinary gadgets (microwave, computer, phone):no  Forgets the month or year: no  Mismanaging finances: no  Remembering appts:no  Daily problems with thinking and/or memory:no Ad8 score is=0   MMSE - Mini Mental State Exam 01/13/2017  Orientation to time 5  Orientation to Place 5  Registration 3  Attention/ Calculation 5  Recall 3  Language- name 2 objects 2  Language- repeat 1  Language- follow 3 step command 3    Language- read & follow direction 1  Write a sentence 1  Copy design 1  Total score 30        Immunization History  Administered Date(s) Administered  . Influenza Split 12/08/2011, 12/05/2015  . Influenza Whole 12/11/2006, 12/14/2007, 12/26/2008, 11/06/2009, 11/11/2010  . Influenza, High Dose Seasonal PF 01/08/2015, 12/04/2016  . Influenza,inj,Quad PF,6+ Mos 11/12/2012, 12/30/2013  . Pneumococcal Conjugate-13 06/27/2013  . Pneumococcal Polysaccharide-23 03/03/2002, 10/06/2007  . Td 03/03/1997, 10/06/2007  . Tdap 09/27/2014  . Zoster 03/03/2014  . Zoster Recombinat (Shingrix) 01/07/2017   Screening Tests Health Maintenance  Topic Date Due  . INFLUENZA VACCINE  10/01/2017  . MAMMOGRAM  01/09/2019  . TETANUS/TDAP  09/26/2024  . DEXA SCAN  Completed  . PNA vac Low Risk Adult  Completed         Plan:    Please schedule your next medicare wellness visit with me in 1 yr.  Continue to eat heart healthy diet (full of fruits, vegetables, whole grains, lean protein, water--limit salt, fat, and sugar intake) and increase physical activity as tolerated.  Continue doing brain stimulating activities (puzzles, reading, adult coloring books, staying active) to keep memory sharp.     I have personally reviewed and noted the following in the patient's chart:   . Medical and social history . Use of alcohol, tobacco or illicit drugs  . Current medications and supplements . Functional ability and status . Nutritional status . Physical activity . Advanced directives . List of other physicians . Hospitalizations, surgeries, and ER visits in previous 12 months . Vitals . Screenings to include cognitive, depression, and falls . Referrals and appointments  In addition, I have reviewed and discussed with patient certain preventive protocols, quality metrics, and best practice recommendations. A written personalized care plan for preventive services as well as general preventive health  recommendations were provided to patient.     Shela Nevin, [place]  03/15/2018  Medical screening examination/treatment was performed by qualified  clinical staff member and as supervising physician I was immediately available for consultation/collaboration. I have reviewed documentation and agree with assessment and plan.  Penni Homans, MD

## 2018-03-15 ENCOUNTER — Encounter: Payer: Self-pay | Admitting: *Deleted

## 2018-03-15 ENCOUNTER — Ambulatory Visit (INDEPENDENT_AMBULATORY_CARE_PROVIDER_SITE_OTHER): Payer: Medicare Other | Admitting: *Deleted

## 2018-03-15 VITALS — BP 138/82 | HR 77 | Ht 64.0 in | Wt 127.2 lb

## 2018-03-15 DIAGNOSIS — Z0000 Encounter for general adult medical examination without abnormal findings: Secondary | ICD-10-CM

## 2018-03-15 NOTE — Patient Instructions (Signed)
Please schedule your next medicare wellness visit with me in 1 yr.  Continue to eat heart healthy diet (full of fruits, vegetables, whole grains, lean protein, water--limit salt, fat, and sugar intake) and increase physical activity as tolerated.  Continue doing brain stimulating activities (puzzles, reading, adult coloring books, staying active) to keep memory sharp.    Christine Henderson , Thank you for taking time to come for your Medicare Wellness Visit. I appreciate your ongoing commitment to your health goals. Please review the following plan we discussed and let me know if I can assist you in the future.   These are the goals we discussed: Goals    . Increase physical activity     Begin walking again.     . Maintain current health (pt-stated)       This is a list of the screening recommended for you and due dates:  Health Maintenance  Topic Date Due  . Mammogram  01/09/2019  . Tetanus Vaccine  09/26/2024  . Flu Shot  Completed  . DEXA scan (bone density measurement)  Completed  . Pneumonia vaccines  Completed    Health Maintenance After Age 35 After age 48, you are at a higher risk for certain long-term diseases and infections as well as injuries from falls. Falls are a major cause of broken bones and head injuries in people who are older than age 76. Getting regular preventive care can help to keep you healthy and well. Preventive care includes getting regular testing and making lifestyle changes as recommended by your health care provider. Talk with your health care provider about:  Which screenings and tests you should have. A screening is a test that checks for a disease when you have no symptoms.  A diet and exercise plan that is right for you. What should I know about screenings and tests to prevent falls? Screening and testing are the best ways to find a health problem early. Early diagnosis and treatment give you the best chance of managing medical conditions that are  common after age 81. Certain conditions and lifestyle choices may make you more likely to have a fall. Your health care provider may recommend:  Regular vision checks. Poor vision and conditions such as cataracts can make you more likely to have a fall. If you wear glasses, make sure to get your prescription updated if your vision changes.  Medicine review. Work with your health care provider to regularly review all of the medicines you are taking, including over-the-counter medicines. Ask your health care provider about any side effects that may make you more likely to have a fall. Tell your health care provider if any medicines that you take make you feel dizzy or sleepy.  Osteoporosis screening. Osteoporosis is a condition that causes the bones to get weaker. This can make the bones weak and cause them to break more easily.  Blood pressure screening. Blood pressure changes and medicines to control blood pressure can make you feel dizzy.  Strength and balance checks. Your health care provider may recommend certain tests to check your strength and balance while standing, walking, or changing positions.  Foot health exam. Foot pain and numbness, as well as not wearing proper footwear, can make you more likely to have a fall.  Depression screening. You may be more likely to have a fall if you have a fear of falling, feel emotionally low, or feel unable to do activities that you used to do.  Alcohol use screening. Using too  much alcohol can affect your balance and may make you more likely to have a fall. What actions can I take to lower my risk of falls? General instructions  Talk with your health care provider about your risks for falling. Tell your health care provider if: ? You fall. Be sure to tell your health care provider about all falls, even ones that seem minor. ? You feel dizzy, sleepy, or off-balance.  Take over-the-counter and prescription medicines only as told by your health care  provider. These include any supplements.  Eat a healthy diet and maintain a healthy weight. A healthy diet includes low-fat dairy products, low-fat (lean) meats, and fiber from whole grains, beans, and lots of fruits and vegetables. Home safety  Remove any tripping hazards, such as rugs, cords, and clutter.  Install safety equipment such as grab bars in bathrooms and safety rails on stairs.  Keep rooms and walkways well-lit. Activity   Follow a regular exercise program to stay fit. This will help you maintain your balance. Ask your health care provider what types of exercise are appropriate for you.  If you need a cane or walker, use it as recommended by your health care provider.  Wear supportive shoes that have nonskid soles. Lifestyle  Do not drink alcohol if your health care provider tells you not to drink.  If you drink alcohol, limit how much you have: ? 0-1 drink a day for women. ? 0-2 drinks a day for men.  Be aware of how much alcohol is in your drink. In the [place] one drink equals one typical bottle of beer (12 oz), one-half glass of wine (5 oz), or one shot of hard liquor (1 oz).  Do not use any products that contain nicotine or tobacco, such as cigarettes and e-cigarettes. If you need help quitting, ask your health care provider. Summary  Having a healthy lifestyle and getting preventive care can help to protect your health and wellness after age 57.  Screening and testing are the best way to find a health problem early and help you avoid having a fall. Early diagnosis and treatment give you the best chance for managing medical conditions that are more common for people who are older than age 44.  Falls are a major cause of broken bones and head injuries in people who are older than age 34. Take precautions to prevent a fall at home.  Work with your health care provider to learn what changes you can make to improve your health and wellness and to prevent falls. This  information is not intended to replace advice given to you by your health care provider. Make sure you discuss any questions you have with your health care provider. Document Released: 12/31/2016 Document Revised: 12/31/2016 Document Reviewed: 12/31/2016 Elsevier Interactive Patient Education  2019 [employer].

## 2018-03-17 DIAGNOSIS — H26491 Other secondary cataract, right eye: Secondary | ICD-10-CM | POA: Diagnosis not present

## 2018-03-17 DIAGNOSIS — H52203 Unspecified astigmatism, bilateral: Secondary | ICD-10-CM | POA: Diagnosis not present

## 2018-03-17 DIAGNOSIS — D3131 Benign neoplasm of right choroid: Secondary | ICD-10-CM | POA: Diagnosis not present

## 2018-03-17 DIAGNOSIS — Z961 Presence of intraocular lens: Secondary | ICD-10-CM | POA: Diagnosis not present

## 2018-04-26 ENCOUNTER — Other Ambulatory Visit: Payer: Self-pay | Admitting: Family Medicine

## 2018-04-26 DIAGNOSIS — I10 Essential (primary) hypertension: Secondary | ICD-10-CM

## 2018-05-31 ENCOUNTER — Other Ambulatory Visit: Payer: Self-pay

## 2018-05-31 ENCOUNTER — Encounter (HOSPITAL_BASED_OUTPATIENT_CLINIC_OR_DEPARTMENT_OTHER): Payer: Self-pay | Admitting: Emergency Medicine

## 2018-05-31 ENCOUNTER — Emergency Department (HOSPITAL_BASED_OUTPATIENT_CLINIC_OR_DEPARTMENT_OTHER)
Admission: EM | Admit: 2018-05-31 | Discharge: 2018-05-31 | Disposition: A | Discharge status: Home / Self Care | Payer: Medicare Other | Attending: Emergency Medicine | Admitting: Emergency Medicine

## 2018-05-31 ENCOUNTER — Emergency Department (HOSPITAL_BASED_OUTPATIENT_CLINIC_OR_DEPARTMENT_OTHER): Payer: Medicare Other

## 2018-05-31 DIAGNOSIS — R05 Cough: Secondary | ICD-10-CM | POA: Insufficient documentation

## 2018-05-31 DIAGNOSIS — I10 Essential (primary) hypertension: Secondary | ICD-10-CM | POA: Insufficient documentation

## 2018-05-31 DIAGNOSIS — R0609 Other forms of dyspnea: Secondary | ICD-10-CM | POA: Insufficient documentation

## 2018-05-31 DIAGNOSIS — Z79899 Other long term (current) drug therapy: Secondary | ICD-10-CM | POA: Diagnosis not present

## 2018-05-31 DIAGNOSIS — Z7982 Long term (current) use of aspirin: Secondary | ICD-10-CM | POA: Insufficient documentation

## 2018-05-31 DIAGNOSIS — R0602 Shortness of breath: Secondary | ICD-10-CM | POA: Diagnosis not present

## 2018-05-31 LAB — CBC WITH DIFFERENTIAL/PLATELET
Abs Immature Granulocytes: 0.03 10*3/uL (ref 0.00–0.07)
BASOS PCT: 0 %
Basophils Absolute: 0 10*3/uL (ref 0.0–0.1)
EOS ABS: 0.2 10*3/uL (ref 0.0–0.5)
Eosinophils Relative: 2 %
HCT: 40.7 % (ref 36.0–46.0)
Hemoglobin: 12.9 g/dL (ref 12.0–15.0)
Immature Granulocytes: 0 %
Lymphocytes Relative: 14 %
Lymphs Abs: 1.8 10*3/uL (ref 0.7–4.0)
MCH: 30.8 pg (ref 26.0–34.0)
MCHC: 31.7 g/dL (ref 30.0–36.0)
MCV: 97.1 fL (ref 80.0–100.0)
Monocytes Absolute: 1 10*3/uL (ref 0.1–1.0)
Monocytes Relative: 8 %
Neutro Abs: 10.2 10*3/uL — ABNORMAL HIGH (ref 1.7–7.7)
Neutrophils Relative %: 76 %
PLATELETS: 423 10*3/uL — AB (ref 150–400)
RBC: 4.19 MIL/uL (ref 3.87–5.11)
RDW: 13.5 % (ref 11.5–15.5)
WBC: 13.4 10*3/uL — ABNORMAL HIGH (ref 4.0–10.5)
nRBC: 0 % (ref 0.0–0.2)

## 2018-05-31 LAB — COMPREHENSIVE METABOLIC PANEL
ALT: 16 U/L (ref 0–44)
AST: 24 U/L (ref 15–41)
Albumin: 3.5 g/dL (ref 3.5–5.0)
Alkaline Phosphatase: 58 U/L (ref 38–126)
Anion gap: 8 (ref 5–15)
BUN: 23 mg/dL (ref 8–23)
CO2: 26 mmol/L (ref 22–32)
Calcium: 10.6 mg/dL — ABNORMAL HIGH (ref 8.9–10.3)
Chloride: 99 mmol/L (ref 98–111)
Creatinine, Ser: 0.98 mg/dL (ref 0.44–1.00)
GFR calc non Af Amer: 52 mL/min — ABNORMAL LOW (ref >60)
Glucose, Bld: 134 mg/dL — ABNORMAL HIGH (ref 70–99)
Potassium: 4 mmol/L (ref 3.5–5.1)
Sodium: 133 mmol/L — ABNORMAL LOW (ref 135–145)
Total Bilirubin: 0.8 mg/dL (ref 0.3–1.2)
Total Protein: 7.9 g/dL (ref 6.5–8.1)

## 2018-05-31 LAB — TROPONIN I

## 2018-05-31 LAB — BRAIN NATRIURETIC PEPTIDE: B Natriuretic Peptide: 154.4 pg/mL — ABNORMAL HIGH (ref 0.0–100.0)

## 2018-05-31 MED ORDER — POTASSIUM CHLORIDE ER 10 MEQ PO TBCR: 10.0000 meq | EXTENDED_RELEASE_TABLET | Freq: Every day | ORAL | 0 refills | Status: DC

## 2018-05-31 MED ORDER — FUROSEMIDE 20 MG PO TABS: 20.0000 mg | ORAL_TABLET | Freq: Every day | ORAL | 0 refills | Status: DC

## 2018-05-31 MED ORDER — FUROSEMIDE 10 MG/ML IJ SOLN
20.0000 mg | Freq: Once | INTRAMUSCULAR | Status: AC
  Administered 2018-05-31: 20 mg via INTRAVENOUS
  Filled 2018-05-31: qty 2

## 2018-05-31 NOTE — ED Provider Notes (Signed)
[facility] EMERGENCY DEPARTMENT Provider Note   CSN: [id number] Arrival date & time: 05/31/18  1622    History   Chief Complaint Chief Complaint  Patient presents with  . Shortness of Breath    HPI Christine Henderson is a 85 y.o. female.     HPI Patient presents with several weeks of worsening shortness of breath especially with exertion.  She is had a nonproductive cough.  No new lower extremity swelling or pain.  Denies chest pain.  Denies any known sick contacts or recent travel.  Was seen at urgent care and referred to the emergency department. Past Medical History:  Diagnosis Date  . Abdominal pain, unspecified site 06/26/2008  . ALLERGIC RHINITIS CAUSE UNSPECIFIED 02/02/2008  . Annual physical exam 12/26/2012  . Diarrhea 06/26/2008  . Fibrocystic breast disease   . Headache(784.0) 10/01/2006  . Hereditary and idiopathic peripheral neuropathy 01/10/2016  . HYPERLIPIDEMIA 10/01/2006  . HYPERTENSION 10/06/2007  . IBS (irritable bowel syndrome)   . Insomnia 11/14/2012  . Irritable bowel syndrome 09/10/2006  . Medicare annual wellness visit, subsequent 12/26/2012   Sees Dr Elinor Parkinson of opthamology.  MGM 5/14     . Metatarsal stress fracture 01/20/2016  . MIGRAINE HEADACHE 09/10/2006  . MVP (mitral valve prolapse)   . Onychomycosis 07/09/2014  . OSTEOPENIA 09/10/2006  . Osteopenia 11/12/2012  . Palpitations 09/10/2006  . PHOTOPHOBIA 07/20/2009  . SKIN TAG 11/09/2006  . VAGINITIS, ATROPHIC 10/01/2006  . Vitamin D deficiency 07/14/2016    Patient Active Problem List   Diagnosis Date Noted  . Vitamin D deficiency 07/14/2016  . Hereditary and idiopathic peripheral neuropathy 01/10/2016  . Leg lesion 07/09/2014  . Paresthesia of foot, bilateral 07/09/2014  . Hyperglycemia 07/09/2014  . Onychomycosis 07/09/2014  . Lumbar back pain with radiculopathy affecting left lower extremity 11/13/2013  . Back pain 06/27/2013  . Preventative health care 12/26/2012  . Insomnia  11/14/2012  . ALLERGIC RHINITIS CAUSE UNSPECIFIED 02/02/2008  . Essential hypertension 10/06/2007  . Hyperlipidemia 10/01/2006  . VAGINITIS, ATROPHIC 10/01/2006  . Migraine headache 09/10/2006  . IRRITABLE BOWEL SYNDROME 09/10/2006  . Disorder of bone and cartilage 09/10/2006    Past Surgical History:  Procedure Laterality Date  . DENTAL SURGERY  09/2014  . ROTATOR CUFF REPAIR Right   . TONSILLECTOMY       OB History    Gravida  4   Para  4   Term  0   Preterm  0   AB  0   Living  0     SAB  0   TAB  0   Ectopic  0   Multiple  0   Live Births               Home Medications    Prior to Admission medications   Medication Sig Start Date End Date Taking? Authorizing Provider  Ascorbic Acid (VITAMIN C) 1000 MG tablet Take 1,000 mg by mouth daily.      [provider]  aspirin 81 MG tablet Take 81 mg by mouth daily.      [provider]  atenolol (TENORMIN) 25 MG tablet TAKE ONE-HALF TABLET BY  MOUTH DAILY 04/26/18   Mosie Lukes, MD  Calcium Carbonate-Vitamin D (CALCIUM PLUS VITAMIN D PO) Take 1,200 tablets by mouth daily.     [provider]  conjugated estrogens (PREMARIN) vaginal cream Place 0.5 g vaginally once a week.  11/11/10   Dorena Cookey, MD  fish oil-omega-3 fatty acids 1000 MG capsule Take 2 g by mouth daily.      [provider]  furosemide (LASIX) 20 MG tablet Take 1 tablet (20 mg total) by mouth daily. 05/31/18   Julianne Rice, MD  Glucosamine-Chondroit-Vit C-Mn (GLUCOSAMINE 1500 COMPLEX) CAPS Take 1,500 capsules by mouth 2 (two) times daily.      [provider]  loperamide (IMODIUM) 2 MG capsule Take 2 mg by mouth 4 (four) times daily as needed. For diarrhea    [provider]  losartan (COZAAR) 25 MG tablet TAKE 1 TABLET BY MOUTH  DAILY 12/28/17   Mosie Lukes, MD  Multiple Vitamin (MULTIVITAMIN) tablet Take 1 tablet by mouth daily.      [provider]  potassium chloride  (K-DUR) 10 MEQ tablet Take 1 tablet (10 mEq total) by mouth daily. 05/31/18   Julianne Rice, MD  simvastatin (ZOCOR) 10 MG tablet TAKE 1 TABLET BY MOUTH AT  BEDTIME 01/19/18   Mosie Lukes, MD    Family History Family History  Problem Relation Age of Onset  . Inflammatory bowel disease Sister   . Alzheimer's disease Sister   . Pancreatic cancer Maternal Grandfather   . Alzheimer's disease Mother   . Hyperlipidemia Son   . Obesity Son   . Hyperlipidemia Son   . Heart disease Paternal Uncle   . Coronary artery disease Neg Hx     Social History Social History   Tobacco Use  . Smoking status: Never Smoker  . Smokeless tobacco: Never Used  Substance Use Topics  . Alcohol use: No  . Drug use: No     Allergies   Codeine   Review of Systems Review of Systems  Constitutional: Negative for chills and fever.  HENT: Negative for congestion, sinus pressure and sore throat.   Respiratory: Positive for cough and shortness of breath.   Cardiovascular: Negative for chest pain, palpitations and leg swelling.  Gastrointestinal: Negative for abdominal pain, constipation, diarrhea and nausea.  Musculoskeletal: Negative for back pain and myalgias.  Skin: Negative for rash and wound.  Neurological: Negative for dizziness, weakness, light-headedness, numbness and headaches.  All other systems reviewed and are negative.    Physical Exam Updated Vital Signs BP (!) 165/95   Pulse 72   Temp 98.3 F (36.8 C) (Oral)   Resp (!) 22   Ht 5\' 3"  (1.6 m)   Wt 53.5 kg   SpO2 93%   BMI 20.90 kg/m   Physical Exam Vitals signs and nursing note reviewed.  Constitutional:      General: She is not in acute distress.    Appearance: Normal appearance. She is well-developed. She is not ill-appearing.  HENT:     Head: Normocephalic and atraumatic.  Eyes:     Pupils: Pupils are equal, round, and reactive to light.  Neck:     Musculoskeletal: Normal range of motion and neck supple. No neck  rigidity or muscular tenderness.  Cardiovascular:     Rate and Rhythm: Normal rate and regular rhythm.     Heart sounds: No murmur. No friction rub. No gallop.   Pulmonary:     Effort: Pulmonary effort is normal.     Breath sounds: Rhonchi present.     Comments: Few rhonchi in bilateral bases.  No respiratory distress. Abdominal:     General: Bowel sounds are normal.     Palpations: Abdomen is soft.     Tenderness: There is no abdominal tenderness. There is  no guarding or rebound.  Musculoskeletal: Normal range of motion.        General: No swelling, tenderness, deformity or signs of injury.     Right lower leg: No edema.     Left lower leg: No edema.  Lymphadenopathy:     Cervical: No cervical adenopathy.  Skin:    General: Skin is warm and dry.     Findings: No erythema or rash.  Neurological:     General: No focal deficit present.     Mental Status: She is alert and oriented to person, place, and time.  Psychiatric:        Mood and Affect: Mood normal.        Behavior: Behavior normal.      ED Treatments / Results  Labs (all labs ordered are listed, but only abnormal results are displayed) Labs Reviewed  CBC WITH DIFFERENTIAL/PLATELET - Abnormal; Notable for the following components:      Result Value   WBC 13.4 (*)    Platelets 423 (*)    Neutro Abs 10.2 (*)    All other components within normal limits  COMPREHENSIVE METABOLIC PANEL - Abnormal; Notable for the following components:   Sodium 133 (*)    Glucose, Bld 134 (*)    Calcium 10.6 (*)    GFR calc non Af Amer 52 (*)    All other components within normal limits  BRAIN NATRIURETIC PEPTIDE - Abnormal; Notable for the following components:   B Natriuretic Peptide 154.4 (*)    All other components within normal limits  TROPONIN I    EKG EKG Interpretation  Date/Time:  Monday May 31 2018 16:35:46 EDT Ventricular Rate:  81 PR Interval:    QRS Duration: 88 QT Interval:  375 QTC Calculation: 436 R  Axis:   -34 Text Interpretation:  Sinus rhythm Prolonged PR interval Left axis deviation Nonspecific T abnormalities, anterior leads Baseline wander in lead(s) V4 Confirmed by Julianne Rice [phone]) on 05/31/2018 4:55:00 PM   Radiology Dg Chest 2 View  Result Date: 05/31/2018 CLINICAL DATA:  Shortness of breath chronic cough for 3 days EXAM: CHEST - 2 VIEW COMPARISON:  02/18/2016 FINDINGS: Enlargement of cardiac silhouette with pulmonary vascular congestion. Mediastinal contours normal. Diffuse interstitial infiltrates, scattered in both lungs and associated with basilar Kerley B-lines, favor pulmonary edema. No segmental consolidation, pleural effusion or pneumothorax. Bones demineralized. IMPRESSION: Enlargement of cardiac silhouette with vascular congestion and probable mild pulmonary edema. Electronically Signed   By: Lavonia Dana M.D.   On: 05/31/2018 17:27    Procedures Procedures (including critical care time)  Medications Ordered in ED Medications  furosemide (LASIX) injection 20 mg (20 mg Intravenous Given 05/31/18 1745)     Initial Impression / Assessment and Plan / ED Course  I have reviewed the triage vital signs and the nursing notes.  Pertinent labs & imaging results that were available during my care of the patient were reviewed by me and considered in my medical decision making (see chart for details).        Patient is in no respiratory distress.  She is afebrile in the emergency department.  X-ray consistent with pulmonary edema.  BNP is mildly elevated.  Given dose of IV Lasix in the emergency department.  Patient has diuresed some.  Slightly improved oxygen saturations.  Patient does have elevated white blood cell count.  Uncertain significance.  Lower suspicion for pneumonia or coronavirus infection.  Will give short course of Lasix with potassium replacement.  Patient is advised to follow-up closely with her primary physician.  Strict return precautions have been  given.  Final Clinical Impressions(s) / ED Diagnoses   Final diagnoses:  Dyspnea on exertion    ED Discharge Orders         Ordered    furosemide (LASIX) 20 MG tablet  Daily     05/31/18 1852    potassium chloride (K-DUR) 10 MEQ tablet  Daily     05/31/18 1852           Julianne Rice, MD 05/31/18 [phone]

## 2018-05-31 NOTE — ED Triage Notes (Signed)
Pt reports shortness of breath, was seen at [facility], sent for further evaluation for hypoxia. Denies chest pain. Cough x 3 days . No fever.

## 2018-05-31 NOTE — ED Notes (Signed)
Pt amb to [facility]

## 2018-06-01 ENCOUNTER — Telehealth (HOSPITAL_BASED_OUTPATIENT_CLINIC_OR_DEPARTMENT_OTHER): Payer: Self-pay | Admitting: Emergency Medicine

## 2018-06-01 ENCOUNTER — Ambulatory Visit: Payer: Self-pay | Admitting: *Deleted

## 2018-06-01 MED ORDER — FUROSEMIDE 20 MG PO TABS: 20.0000 mg | ORAL_TABLET | Freq: Every day | ORAL | 0 refills | Status: DC

## 2018-06-01 MED ORDER — POTASSIUM CHLORIDE ER 10 MEQ PO TBCR: 10.0000 meq | EXTENDED_RELEASE_TABLET | Freq: Every day | ORAL | 0 refills | Status: DC

## 2018-06-01 MED FILL — POTASSIUM CL ER 10 MEQ TAB: 10 | 5 days supply | Qty: 5 | Fill #0

## 2018-06-01 MED FILL — FUROSEMIDE 20 MG TAB: 20 | 5 days supply | Qty: 5 | Fill #0

## 2018-06-01 NOTE — Telephone Encounter (Signed)
Patient can't find prescription and not at the pharmacy, will Rx her lasix/potassium she was given yesterday

## 2018-06-01 NOTE — Telephone Encounter (Signed)
Patient called and per NT note below, patient doesn't have internet access, no smart phone, so will not be able to do a webex visit, she says she can do a phone visit. I advised someone from the office will call tomorrow to set up a scheduled time, she verbalized understanding.

## 2018-06-01 NOTE — Telephone Encounter (Signed)
Contacted pt regarding her symptoms; she was seen in ED on 05/31/2018; she was directed to see her PCP; pt answered "no" to questions on [facility] Corona Virus Evaluation; the pt says that her symptoms started on 05/27/2018; the pt says the 1st weekend in March she was having body aches, fatigue, no appetitie and a runny nose; her temp on 05/31/2018 it was 99.3 at home, and 98.3 in ED; she does not have internet access; recommendations made per nurse triage protocol; will route to office for final disposition; he pt is seen by Dr Charlett Blake, [facility]; will route to office for notification.  Reason for Disposition . [1] MODERATE longstanding difficulty breathing (e.g., speaks in phrases, SOB even at rest, pulse 100-120) AND [2] SAME as normal  Answer Assessment - Initial Assessment Questions 1. RESPIRATORY STATUS: "Describe your breathing?" (e.g., wheezing, shortness of breath, unable to speak, severe coughing)      Shortness of breath 2. ONSET: "When did this breathing problem begin?"     05/25/2018 3. PATTERN "Does the difficult breathing come and go, or has it been constant since it started?"      Intermittent; with exertion 4. SEVERITY: "How bad is your breathing?" (e.g., mild, moderate, severe)    - MILD: No SOB at rest, mild SOB with walking, speaks normally in sentences, can lay down, no retractions, pulse < 100.    - MODERATE: SOB at rest, SOB with minimal exertion and prefers to sit, cannot lie down flat, speaks in phrases, mild retractions, audible wheezing, pulse 100-120.    - SEVERE: Very SOB at rest, speaks in single words, struggling to breathe, sitting hunched forward, retractions, pulse > 120    moderate 5. RECURRENT SYMPTOM: "Have you had difficulty breathing before?" If so, ask: "When was the last time?" and "What happened that time?"      no 6. CARDIAC HISTORY: "Do you have any history of heart disease?" (e.g., heart attack, angina, bypass surgery, angioplasty)     hypertension 7. LUNG  HISTORY: "Do you have any history of lung disease?"  (e.g., pulmonary embolus, asthma, emphysema)     no 8. CAUSE: "What do you think is causing the breathing problem?"      "I don't have any idea" 9. OTHER SYMPTOMS: "Do you have any other symptoms? (e.g., dizziness, runny nose, cough, chest pain, fever)     Coughing, hoarse 10. PREGNANCY: "Is there any chance you are pregnant?" "When was your last menstrual period?"       no 11. TRAVEL: "Have you traveled out of the country in the last month?" (e.g., travel history, exposures)       no  Protocols used: BREATHING DIFFICULTY-A-AH

## 2018-06-01 NOTE — Telephone Encounter (Signed)
Called patient unable to leave voicemail phone just rang.  Patient will need to schedule a webex visit

## 2018-06-02 ENCOUNTER — Other Ambulatory Visit: Payer: Self-pay

## 2018-06-02 ENCOUNTER — Telehealth: Payer: Self-pay | Admitting: *Deleted

## 2018-06-02 ENCOUNTER — Encounter (HOSPITAL_BASED_OUTPATIENT_CLINIC_OR_DEPARTMENT_OTHER): Payer: Self-pay

## 2018-06-02 ENCOUNTER — Emergency Department (HOSPITAL_BASED_OUTPATIENT_CLINIC_OR_DEPARTMENT_OTHER): Payer: Medicare Other

## 2018-06-02 ENCOUNTER — Ambulatory Visit: Payer: Self-pay | Admitting: *Deleted

## 2018-06-02 ENCOUNTER — Emergency Department (HOSPITAL_BASED_OUTPATIENT_CLINIC_OR_DEPARTMENT_OTHER)
Admission: EM | Admit: 2018-06-02 | Discharge: 2018-06-02 | Disposition: A | Discharge status: Home / Self Care | Payer: Medicare Other | Attending: Emergency Medicine | Admitting: Emergency Medicine

## 2018-06-02 DIAGNOSIS — R55 Syncope and collapse: Secondary | ICD-10-CM | POA: Diagnosis not present

## 2018-06-02 DIAGNOSIS — I10 Essential (primary) hypertension: Secondary | ICD-10-CM | POA: Insufficient documentation

## 2018-06-02 DIAGNOSIS — Z7982 Long term (current) use of aspirin: Secondary | ICD-10-CM | POA: Diagnosis not present

## 2018-06-02 DIAGNOSIS — Z79899 Other long term (current) drug therapy: Secondary | ICD-10-CM | POA: Diagnosis not present

## 2018-06-02 DIAGNOSIS — Z20828 Contact with and (suspected) exposure to other viral communicable diseases: Secondary | ICD-10-CM | POA: Insufficient documentation

## 2018-06-02 DIAGNOSIS — Z7984 Long term (current) use of oral hypoglycemic drugs: Secondary | ICD-10-CM | POA: Insufficient documentation

## 2018-06-02 DIAGNOSIS — J129 Viral pneumonia, unspecified: Secondary | ICD-10-CM | POA: Insufficient documentation

## 2018-06-02 DIAGNOSIS — R0602 Shortness of breath: Secondary | ICD-10-CM | POA: Diagnosis not present

## 2018-06-02 LAB — CBC WITH DIFFERENTIAL/PLATELET
Abs Immature Granulocytes: 0.06 10*3/uL (ref 0.00–0.07)
Basophils Absolute: 0.1 10*3/uL (ref 0.0–0.1)
Basophils Relative: 0 %
Eosinophils Absolute: 0.1 10*3/uL (ref 0.0–0.5)
Eosinophils Relative: 1 %
HCT: 39.9 % (ref 36.0–46.0)
Hemoglobin: 12.9 g/dL (ref 12.0–15.0)
Immature Granulocytes: 0 %
Lymphocytes Relative: 11 %
Lymphs Abs: 1.5 10*3/uL (ref 0.7–4.0)
MCH: 31.3 pg (ref 26.0–34.0)
MCHC: 32.3 g/dL (ref 30.0–36.0)
MCV: 96.8 fL (ref 80.0–100.0)
Monocytes Absolute: 0.7 10*3/uL (ref 0.1–1.0)
Monocytes Relative: 5 %
Neutro Abs: 11.6 10*3/uL — ABNORMAL HIGH (ref 1.7–7.7)
Neutrophils Relative %: 83 %
Platelets: 392 10*3/uL (ref 150–400)
RBC: 4.12 MIL/uL (ref 3.87–5.11)
RDW: 13.4 % (ref 11.5–15.5)
WBC: 14.1 10*3/uL — ABNORMAL HIGH (ref 4.0–10.5)
nRBC: 0 % (ref 0.0–0.2)

## 2018-06-02 LAB — TROPONIN I: Troponin I: <0.03 ng/mL (ref <0.03)

## 2018-06-02 LAB — COMPREHENSIVE METABOLIC PANEL
ALT: 21 U/L (ref 0–44)
AST: 34 U/L (ref 15–41)
Albumin: 3.3 g/dL — ABNORMAL LOW (ref 3.5–5.0)
Alkaline Phosphatase: 58 U/L (ref 38–126)
Anion gap: 11 (ref 5–15)
BUN: 30 mg/dL — ABNORMAL HIGH (ref 8–23)
CO2: 27 mmol/L (ref 22–32)
Calcium: 11.5 mg/dL — ABNORMAL HIGH (ref 8.9–10.3)
Chloride: 97 mmol/L — ABNORMAL LOW (ref 98–111)
Creatinine, Ser: 1.08 mg/dL — ABNORMAL HIGH (ref 0.44–1.00)
GFR calc Af Amer: 54 mL/min — ABNORMAL LOW (ref >60)
GFR calc non Af Amer: 46 mL/min — ABNORMAL LOW (ref >60)
Glucose, Bld: 170 mg/dL — ABNORMAL HIGH (ref 70–99)
Potassium: 4 mmol/L (ref 3.5–5.1)
Sodium: 135 mmol/L (ref 135–145)
Total Bilirubin: 1.1 mg/dL (ref 0.3–1.2)
Total Protein: 7.6 g/dL (ref 6.5–8.1)

## 2018-06-02 LAB — RESPIRATORY PANEL BY PCR

## 2018-06-02 LAB — BRAIN NATRIURETIC PEPTIDE: B Natriuretic Peptide: 78.1 pg/mL (ref 0.0–100.0)

## 2018-06-02 MED ORDER — IOHEXOL 350 MG/ML SOLN
100.0000 mL | Freq: Once | INTRAVENOUS | Status: AC | PRN
  Administered 2018-06-02: 16:00:00 100 mL via INTRAVENOUS

## 2018-06-02 MED ORDER — DOXYCYCLINE HYCLATE 100 MG PO CAPS: 100.0000 mg | ORAL_CAPSULE | Freq: Two times a day (BID) | ORAL | 0 refills | Status: DC

## 2018-06-02 MED FILL — DOXYCYCLINE HYCLATE 100 MG: 100 | 7 days supply | Qty: 14 | Fill #0

## 2018-06-02 NOTE — ED Notes (Signed)
Surgical mask, gloves and gown worn with initial pt contact. N95, gown, gloves and goggles worn after pt's CT scan came back and until pt d/c.

## 2018-06-02 NOTE — Telephone Encounter (Signed)
Pt's son calling initially. States he received a call from pt stating she had "Passed out" this am. States she was standing with the refrigerator open and when she woke up she was on the floor. States "I don't think it was for very long." States she has been lightheaded "Off and on" all day, not presently. Seen in ED 05/31/2018 'pulmonary edema.' Pt presently denies SOB, CP.  Speech non-falting during call. Denies dizziness presently, sitting on couch. States "May not be staying hydrated." Pt states let hip sore from fall, reports normal ROM. States did not hit head.  Directed pt to ED. TN called son Annie Main who will leave work and transport. Offered to call 911, son states he will have neighbor call and check on her and keep her online until he arrives. Pt verbalizes understanding. Offered EMS to pt, declines. "I'll be OK sitting here."  TN called and spoke to Robin at practice to alert of situation.   Reason for Disposition . [1] Fainted > 15 minutes ago AND [2] still feels too weak or dizzy to stand    Denies dizziness, weakness presently  Answer Assessment - Initial Assessment Questions 1. DESCRIPTION: "Describe your dizziness."     Lightheadedness, not presently 2. LIGHTHEADED: "Do you feel lightheaded?" (e.g., somewhat faint, woozy, weak upon standing)     yes 3. VERTIGO: "Do you feel like either you or the room is spinning or tilting?" (i.e. vertigo)     No "Floaty" 4. SEVERITY: "How bad is it?"  "Do you feel like you are going to faint?" "Can you stand and walk?"   - MILD - walking normally   - MODERATE - interferes with normal activities (e.g., work, school)    - SEVERE - unable to stand, requires support to walk, feels like passing out now.      Passed out this am 5. ONSET:  "When did the dizziness begin?"     This am 6. AGGRAVATING FACTORS: "Does anything make it worse?" (e.g., standing, change in head position)     No 7. HEART RATE: "Can you tell me your heart rate?" "How many beats  in 15 seconds?"  (Note: not all patients can do this)       no 8. CAUSE: "What do you think is causing the dizziness?"     unsure 9. RECURRENT SYMPTOM: "Have you had dizziness before?" If so, ask: "When was the last time?" "What happened that time?"     No. Seen in ED 05/31/2018  pulmonary edema 10. OTHER SYMPTOMS: "Do you have any other symptoms?" (e.g., fever, chest pain, vomiting, diarrhea, bleeding)       Nausea, mild, not presently. No vomiting.  Protocols used: DIZZINESS [facility]

## 2018-06-02 NOTE — Discharge Instructions (Addendum)
Take tylenol 2 pills 4 times a day and motrin 4 pills 3 times a day.  Drink plenty of fluids.  Return for worsening shortness of breath, headache, confusion. Follow up with your family doctor.   The CT scan is concerning for a viral pneumonia.  This could be that virus that everyone is currently worried about.  Please return for any worsening symptoms shortness of breath if you feel worse if you get a fever.  Please reach out to your family doctor and let them know that you are sick.     Person Under Monitoring Name: Christine Henderson  Location: [address] [id number]   Infection Prevention Recommendations for Individuals Confirmed to have, or Being Evaluated for, 2019 Novel Coronavirus (COVID-19) Infection Who Receive Care at Home  Individuals who are confirmed to have, or are being evaluated for, COVID-19 should follow the prevention steps below until a healthcare provider or local or state health department says they can return to normal activities.  Stay home except to get medical care You should restrict activities outside your home, except for getting medical care. Do not go to work, school, or public areas, and do not use public transportation or taxis.  Call ahead before visiting your doctor Before your medical appointment, call the healthcare provider and tell them that you have, or are being evaluated for, COVID-19 infection. This will help the healthcare providers office take steps to keep other people from getting infected. Ask your healthcare provider to call the local or state health department.  Monitor your symptoms Seek prompt medical attention if your illness is worsening (e.g., difficulty breathing). Before going to your medical appointment, call the healthcare provider and tell them that you have, or are being evaluated for, COVID-19 infection. Ask your healthcare provider to call the local or state health department.  Wear a facemask You  should wear a facemask that covers your nose and mouth when you are in the same room with other people and when you visit a healthcare provider. People who live with or visit you should also wear a facemask while they are in the same room with you.  Separate yourself from other people in your home As much as possible, you should stay in a different room from other people in your home. Also, you should use a separate bathroom, if available.  Avoid sharing household items You should not share dishes, drinking glasses, cups, eating utensils, towels, bedding, or other items with other people in your home. After using these items, you should wash them thoroughly with soap and water.  Cover your coughs and sneezes Cover your mouth and nose with a tissue when you cough or sneeze, or you can cough or sneeze into your sleeve. Throw used tissues in a lined trash can, and immediately wash your hands with soap and water for at least 20 seconds or use an alcohol-based hand rub.  Wash your [facility] your hands often and thoroughly with soap and water for at least 20 seconds. You can use an alcohol-based hand sanitizer if soap and water are not available and if your hands are not visibly dirty. Avoid touching your eyes, nose, and mouth with unwashed hands.   Prevention Steps for Caregivers and Household Members of Individuals Confirmed to have, or Being Evaluated for, COVID-19 Infection Being Cared for in the Home  If you live with, or provide care at home for, a person confirmed to have, or being evaluated for,  COVID-19 infection please follow these guidelines to prevent infection:  Follow healthcare providers instructions Make sure that you understand and can help the patient follow any healthcare provider instructions for all care.  Provide for the patients basic needs You should help the patient with basic needs in the home and provide support for getting groceries, prescriptions, and other  personal needs.  Monitor the patients symptoms If they are getting sicker, call his or her medical provider and tell them that the patient has, or is being evaluated for, COVID-19 infection. This will help the healthcare providers office take steps to keep other people from getting infected. Ask the healthcare provider to call the local or state health department.  Limit the number of people who have contact with the patient If possible, have only one caregiver for the patient. Other household members should stay in another home or place of residence. If this is not possible, they should stay in another room, or be separated from the patient as much as possible. Use a separate bathroom, if available. Restrict visitors who do not have an essential need to be in the home.  Keep older adults, very young children, and other sick people away from the patient Keep older adults, very young children, and those who have compromised immune systems or chronic health conditions away from the patient. This includes people with chronic heart, lung, or kidney conditions, diabetes, and cancer.  Ensure good ventilation Make sure that shared spaces in the home have good air flow, such as from an air conditioner or an opened window, weather permitting.  Wash your hands often Wash your hands often and thoroughly with soap and water for at least 20 seconds. You can use an alcohol based hand sanitizer if soap and water are not available and if your hands are not visibly dirty. Avoid touching your eyes, nose, and mouth with unwashed hands. Use disposable paper towels to dry your hands. If not available, use dedicated cloth towels and replace them when they become wet.  Wear a facemask and gloves Wear a disposable facemask at all times in the room and gloves when you touch or have contact with the patients blood, body fluids, and/or secretions or excretions, such as sweat, saliva, sputum, nasal mucus, vomit,  urine, or feces.  Ensure the mask fits over your nose and mouth tightly, and do not touch it during use. Throw out disposable facemasks and gloves after using them. Do not reuse. Wash your hands immediately after removing your facemask and gloves. If your personal clothing becomes contaminated, carefully remove clothing and launder. Wash your hands after handling contaminated clothing. Place all used disposable facemasks, gloves, and other waste in a lined container before disposing them with other household waste. Remove gloves and wash your hands immediately after handling these items.  Do not share dishes, glasses, or other household items with the patient Avoid sharing household items. You should not share dishes, drinking glasses, cups, eating utensils, towels, bedding, or other items with a patient who is confirmed to have, or being evaluated for, COVID-19 infection. After the person uses these items, you should wash them thoroughly with soap and water.  Wash laundry thoroughly Immediately remove and wash clothes or bedding that have blood, body fluids, and/or secretions or excretions, such as sweat, saliva, sputum, nasal mucus, vomit, urine, or feces, on them. Wear gloves when handling laundry from the patient. Read and follow directions on labels of laundry or clothing items and detergent. In general, wash and  dry with the warmest temperatures recommended on the label.  Clean all areas the individual has used often Clean all touchable surfaces, such as counters, tabletops, doorknobs, bathroom fixtures, toilets, phones, keyboards, tablets, and bedside tables, every day. Also, clean any surfaces that may have blood, body fluids, and/or secretions or excretions on them. Wear gloves when cleaning surfaces the patient has come in contact with. Use a diluted bleach solution (e.g., dilute bleach with 1 part bleach and 10 parts water) or a household disinfectant with a label that says  EPA-registered for coronaviruses. To make a bleach solution at home, add 1 tablespoon of bleach to 1 quart (4 cups) of water. For a larger supply, add  cup of bleach to 1 gallon (16 cups) of water. Read labels of cleaning products and follow recommendations provided on product labels. Labels contain instructions for safe and effective use of the cleaning product including precautions you should take when applying the product, such as wearing gloves or eye protection and making sure you have good ventilation during use of the product. Remove gloves and wash hands immediately after cleaning.  Monitor yourself for signs and symptoms of illness Caregivers and household members are considered close contacts, should monitor their health, and will be asked to limit movement outside of the home to the extent possible. Follow the monitoring steps for close contacts listed on the symptom monitoring form.   ? If you have additional questions, contact your local health department or call the epidemiologist on call at [phone] (available 24/7). ? This guidance is subject to change. For the most up-to-date guidance from [facility], please refer to their website: [email or web address]

## 2018-06-02 NOTE — Telephone Encounter (Signed)
Received patient UC paperwork from 05/31/18 visit that sent patient to ER with abnormal labs. She had syncope incident today and son is on way to take her to hospital. Tested Negative for A&B Influenza at [facility] on 05/31/18 per 06/02/18 Nurse triage note. Sent message to provider for review as she is out of office today/SLS 04/01

## 2018-06-02 NOTE — ED Triage Notes (Signed)
Pt c/o cont'd SOB-seen here for same 3/30-pt states she passed out in her kitchen floor ~10am-she was putting groceries away and she woke on her kitchen floor-to triage in w/c

## 2018-06-02 NOTE — Telephone Encounter (Signed)
Agree with ER evaluation

## 2018-06-02 NOTE — ED Notes (Signed)
Pt sts she woke up dizzy and lightheaded this am. Pt had syncopal episode, unknown length of time after putting something in refrigerator.

## 2018-06-02 NOTE — ED Notes (Signed)
Pt returned from CT. No needs identified.

## 2018-06-02 NOTE — ED Provider Notes (Addendum)
[facility] Provider Note   CSN: [id number] Arrival date & time: 06/02/18  1435    History   Chief Complaint Chief Complaint  Patient presents with  . Shortness of Breath  . Loss of Consciousness    HPI Christine Henderson is a 85 y.o. female.     85 yo F with a chief complaint of a syncopal event.  Patient woke up this morning and felt a bit lightheaded.  She had stopped getting dressed and take a break and then was able to later get dressed and was putting groceries away in her refrigerator when she woke up on the ground.  She thinks that she hurt her leg a little bit but has been ambulatory.  She was seen in the ED a couple days ago for shortness of breath on exertion.  And that has persisted she does not feel that is gotten better or worse.  She has had a mild cough for the past few days.  Feeling like her throat is little bit sore.  Denies sinus congestion denies fevers denies sick contacts.  She denies history of PE or DVT denies recent surgery or immobilization.  Denies history of cancer.  Denies estrogen use.  The history is provided by the patient.  Shortness of Breath  Severity:  Moderate Onset quality:  Sudden Duration:  4 days Timing:  Constant Progression:  Worsening Chronicity:  New Relieved by:  Nothing Worsened by:  Nothing Ineffective treatments:  None tried Associated symptoms: syncope   Associated symptoms: no chest pain, no fever, no headaches, no vomiting and no wheezing   Loss of Consciousness  Associated symptoms: shortness of breath   Associated symptoms: no chest pain, no dizziness, no fever, no headaches, no nausea, no palpitations and no vomiting     Past Medical History:  Diagnosis Date  . Abdominal pain, unspecified site 06/26/2008  . ALLERGIC RHINITIS CAUSE UNSPECIFIED 02/02/2008  . Annual physical exam 12/26/2012  . Diarrhea 06/26/2008  . Fibrocystic breast disease   . Headache(784.0) 10/01/2006  . Hereditary and  idiopathic peripheral neuropathy 01/10/2016  . HYPERLIPIDEMIA 10/01/2006  . HYPERTENSION 10/06/2007  . IBS (irritable bowel syndrome)   . Insomnia 11/14/2012  . Irritable bowel syndrome 09/10/2006  . Medicare annual wellness visit, subsequent 12/26/2012   Sees Dr Elinor Parkinson of opthamology.  MGM 5/14     . Metatarsal stress fracture 01/20/2016  . MIGRAINE HEADACHE 09/10/2006  . MVP (mitral valve prolapse)   . Onychomycosis 07/09/2014  . OSTEOPENIA 09/10/2006  . Osteopenia 11/12/2012  . Palpitations 09/10/2006  . PHOTOPHOBIA 07/20/2009  . SKIN TAG 11/09/2006  . VAGINITIS, ATROPHIC 10/01/2006  . Vitamin D deficiency 07/14/2016    Patient Active Problem List   Diagnosis Date Noted  . Vitamin D deficiency 07/14/2016  . Hereditary and idiopathic peripheral neuropathy 01/10/2016  . Leg lesion 07/09/2014  . Paresthesia of foot, bilateral 07/09/2014  . Hyperglycemia 07/09/2014  . Onychomycosis 07/09/2014  . Lumbar back pain with radiculopathy affecting left lower extremity 11/13/2013  . Back pain 06/27/2013  . Preventative health care 12/26/2012  . Insomnia 11/14/2012  . ALLERGIC RHINITIS CAUSE UNSPECIFIED 02/02/2008  . Essential hypertension 10/06/2007  . Hyperlipidemia 10/01/2006  . VAGINITIS, ATROPHIC 10/01/2006  . Migraine headache 09/10/2006  . IRRITABLE BOWEL SYNDROME 09/10/2006  . Disorder of bone and cartilage 09/10/2006    Past Surgical History:  Procedure Laterality Date  . DENTAL SURGERY  09/2014  . ROTATOR CUFF REPAIR Right   .  TONSILLECTOMY       OB History    Gravida  4   Para  4   Term  0   Preterm  0   AB  0   Living  0     SAB  0   TAB  0   Ectopic  0   Multiple  0   Live Births               Home Medications    Prior to Admission medications   Medication Sig Start Date End Date Taking? Authorizing Provider  Ascorbic Acid (VITAMIN C) 1000 MG tablet Take 1,000 mg by mouth daily.      [provider]  aspirin 81 MG tablet Take 81  mg by mouth daily.      [provider]  atenolol (TENORMIN) 25 MG tablet TAKE ONE-HALF TABLET BY  MOUTH DAILY 04/26/18   Mosie Lukes, MD  Calcium Carbonate-Vitamin D (CALCIUM PLUS VITAMIN D PO) Take 1,200 tablets by mouth daily.     [provider]  conjugated estrogens (PREMARIN) vaginal cream Place 0.5 g vaginally once a week.  11/11/10   Dorena Cookey, MD  doxycycline (VIBRAMYCIN) 100 MG capsule Take 1 capsule (100 mg total) by mouth 2 (two) times daily. One po bid x 7 days 06/02/18   Deno Etienne, DO  fish oil-omega-3 fatty acids 1000 MG capsule Take 2 g by mouth daily.      [provider]  furosemide (LASIX) 20 MG tablet Take 1 tablet (20 mg total) by mouth daily. 06/01/18   Sherwood Gambler, MD  Glucosamine-Chondroit-Vit C-Mn (GLUCOSAMINE 1500 COMPLEX) CAPS Take 1,500 capsules by mouth 2 (two) times daily.      [provider]  loperamide (IMODIUM) 2 MG capsule Take 2 mg by mouth 4 (four) times daily as needed. For diarrhea    [provider]  losartan (COZAAR) 25 MG tablet TAKE 1 TABLET BY MOUTH  DAILY 12/28/17   Mosie Lukes, MD  Multiple Vitamin (MULTIVITAMIN) tablet Take 1 tablet by mouth daily.      [provider]  potassium chloride (K-DUR) 10 MEQ tablet Take 1 tablet (10 mEq total) by mouth daily. 06/01/18   Sherwood Gambler, MD  simvastatin (ZOCOR) 10 MG tablet TAKE 1 TABLET BY MOUTH AT  BEDTIME 01/19/18   Mosie Lukes, MD    Family History Family History  Problem Relation Age of Onset  . Inflammatory bowel disease Sister   . Alzheimer's disease Sister   . Pancreatic cancer Maternal Grandfather   . Alzheimer's disease Mother   . Hyperlipidemia Son   . Obesity Son   . Hyperlipidemia Son   . Heart disease Paternal Uncle   . Coronary artery disease Neg Hx     Social History Social History   Tobacco Use  . Smoking status: Never Smoker  . Smokeless tobacco: Never Used  Substance Use Topics  . Alcohol use: No  .  Drug use: No     Allergies   Codeine   Review of Systems Review of Systems  Constitutional: Negative for chills and fever.  HENT: Negative for congestion and rhinorrhea.   Eyes: Negative for redness and visual disturbance.  Respiratory: Positive for shortness of breath. Negative for wheezing.   Cardiovascular: Positive for syncope. Negative for chest pain and palpitations.  Gastrointestinal: Negative for nausea and vomiting.  Genitourinary: Negative for dysuria and urgency.  Musculoskeletal: Negative for arthralgias and myalgias.  Skin:  Negative for pallor and wound.  Neurological: Negative for dizziness and headaches.     Physical Exam Updated Vital Signs BP 119/68   Pulse 73   Temp 98 F (36.7 C) (Oral)   Resp 14   SpO2 94%   Physical Exam Vitals signs and nursing note reviewed.  Constitutional:      General: She is not in acute distress.    Appearance: She is well-developed. She is not diaphoretic.  HENT:     Head: Normocephalic and atraumatic.     Comments: Swollen turbinates, posterior nasal drip, no noted sinus ttp, tm normal bilaterally.   Eyes:     Pupils: Pupils are equal, round, and reactive to light.  Neck:     Musculoskeletal: Normal range of motion and neck supple.     Vascular: JVD (just above clavicles) present.  Cardiovascular:     Rate and Rhythm: Normal rate and regular rhythm.     Heart sounds: No murmur. No friction rub. No gallop.   Pulmonary:     Effort: Pulmonary effort is normal.     Breath sounds: Rales (faint, bases) present. No wheezing.  Abdominal:     General: There is no distension.     Palpations: Abdomen is soft.     Tenderness: There is no abdominal tenderness.  Musculoskeletal:        General: No tenderness.  Skin:    General: Skin is warm and dry.  Neurological:     Mental Status: She is alert and oriented to person, place, and time.  Psychiatric:        Behavior: Behavior normal.      ED Treatments / Results   Labs (all labs ordered are listed, but only abnormal results are displayed) Labs Reviewed  CBC WITH DIFFERENTIAL/PLATELET - Abnormal; Notable for the following components:      Result Value   WBC 14.1 (*)    Neutro Abs 11.6 (*)    All other components within normal limits  COMPREHENSIVE METABOLIC PANEL - Abnormal; Notable for the following components:   Chloride 97 (*)    Glucose, Bld 170 (*)    BUN 30 (*)    Creatinine, Ser 1.08 (*)    Calcium 11.5 (*)    Albumin 3.3 (*)    GFR calc non Af Amer 46 (*)    GFR calc Af Amer 54 (*)    All other components within normal limits  RESPIRATORY PANEL BY PCR  NOVEL CORONAVIRUS, NAA (HOSPITAL ORDER, SEND-OUT TO REF LAB)  TROPONIN I  BRAIN NATRIURETIC PEPTIDE    EKG EKG Interpretation  Date/Time:  Wednesday June 02 2018 14:55:43 EDT Ventricular Rate:  68 PR Interval:    QRS Duration: 91 QT Interval:  392 QTC Calculation: 417 R Axis:   -35 Text Interpretation:  Sinus rhythm Ventricular premature complex Prolonged PR interval Left axis deviation No significant change since last tracing Confirmed by Deno Etienne [phone]) on 06/02/2018 3:18:54 PM   Radiology Dg Chest 2 View  Result Date: 05/31/2018 CLINICAL DATA:  Shortness of breath chronic cough for 3 days EXAM: CHEST - 2 VIEW COMPARISON:  02/18/2016 FINDINGS: Enlargement of cardiac silhouette with pulmonary vascular congestion. Mediastinal contours normal. Diffuse interstitial infiltrates, scattered in both lungs and associated with basilar Kerley B-lines, favor pulmonary edema. No segmental consolidation, pleural effusion or pneumothorax. Bones demineralized. IMPRESSION: Enlargement of cardiac silhouette with vascular congestion and probable mild pulmonary edema. Electronically Signed   By: Lavonia Dana M.D.   On:  05/31/2018 17:27   Ct Angio Chest Pe W And/or Wo Contrast  Result Date: 06/02/2018 CLINICAL DATA:  85 year old female with shortness of breath and recent syncope EXAM: CT  ANGIOGRAPHY CHEST WITH CONTRAST TECHNIQUE: Multidetector CT imaging of the chest was performed using the standard protocol during bolus administration of intravenous contrast. Multiplanar CT image reconstructions and MIPs were obtained to evaluate the vascular anatomy. CONTRAST:  145mL OMNIPAQUE IOHEXOL 350 MG/ML SOLN COMPARISON:  Chest x-ray 05/31/2018 FINDINGS: Cardiovascular: Adequate opacification of the pulmonary arteries to the segmental level. No evidence of acute pulmonary embolus. Aneurysmal dilatation of the tubular portion of the ascending thoracic aorta with a maximal transverse diameter of 4.0 cm. Elongation of the hard secondary to severe pectus excavatum. Mediastinum/Nodes: Distorted mediastinum secondary to severe pectus excavatum. Small 8 mm right thyroid nodule. No further follow-up required given patient age and nodule size less than 1.5 cm. No suspicious mediastinal or hilar adenopathy. Unremarkable thoracic esophagus. Lungs/Pleura: Nonspecific multifocal patchy ground-glass attenuation airspace opacity predominantly in the periphery of the lungs affecting the right upper and right lower lobes predominantly with minimal changes in the left upper lobe. No evidence of pulmonary edema, pleural effusion or pneumothorax. Diffuse mild bronchial wall thickening. Upper Abdomen: No acute abnormality. Musculoskeletal: Severe pectus excavatum. The Haller index is 8.1 no acute osseous abnormality. Incompletely imaged lumbar levoconvex scoliosis. Review of the MIP images confirms the above findings. IMPRESSION: 1. Negative for acute pulmonary embolus. 2. Patchy, predominantly peripheral ground-glass attenuation airspace opacities with small foci of more nodular ground-glass attenuation airspace opacity in the right upper, right lower and left upper lobes. Differential considerations include multifocal pneumonia as well as atypical and viral pneumonia. 3. Mild aneurysmal dilatation of the ascending thoracic  aorta with a maximal diameter of 4.0 cm. Recommend annual imaging followup by CTA or MRA. This recommendation follows 2010 ACCF/AHA/AATS/ACR/ASA/SCA/SCAI/SIR/STS/SVM Guidelines for the Diagnosis and Management of Patients with Thoracic Aortic Disease. Circulation. 2010; 121: G295-M841. Aortic aneurysm NOS (ICD10-I71.9) 4. Severe pectus excavatum with a Haller index of 8.1. This results in distortion of the mediastinum and displacement and elongation of the heart into the left chest. 5. Diffuse mild bronchial wall thickening. Electronically Signed   By: Jacqulynn Cadet M.D.   On: 06/02/2018 16:31    Procedures Procedures (including critical care time)  Medications Ordered in ED Medications  iohexol (OMNIPAQUE) 350 MG/ML injection 100 mL (100 mLs Intravenous Contrast Given 06/02/18 1557)     Initial Impression / Assessment and Plan / ED Course  I have reviewed the triage vital signs and the nursing notes.  Pertinent labs & imaging results that were available during my care of the patient were reviewed by me and considered in my medical decision making (see chart for details).  Clinical Course as of Jun 01 1641  Wed Jun 02, 2018  1556 Mild leukocytosis, bnp normal, trop negative, mild worsening of creatinine likely secondary to starting lasix   [DF]    Clinical Course User Index [DF] Deno Etienne, DO       85 yo F with a chief complaint of a syncopal event.  The patient unfortunately has been seen recently and was here for shortness of breath on exertion.  Now that she was short of breath on exertion and had a syncopal event is concerning for possible pulmonary embolism.  Will obtain a CT angiogram of the chest to further evaluate.  She also was found to recently have what was thought to be pulmonary edema on chest x-ray,  she was started on Lasix.  Has had no improvement at home.  The syncopal event could be multifactorial the patient could have passed out due to dehydration with recent Lasix  use but there is also concerned that the patient had a arrhythmia since she may have now undiagnosed heart failure and been started on Lasix as an outpatient.  CT scan shows no heart failure but is concerning for a atypical viral pneumonia versus multifocal pneumonia.  Patient continues to maintain a good oxygen saturation on room air.  She does not seem short of breath while I am talking with her she has no tachypnea.  Her syncopal event may be due to volume loss with the recent Lasix administration.  I discussed with her the results and offered her admission with concern for possible multifocal pneumonia versus coronavirus infection.  Patient at this point feels safe going home, I will start her on a course of doxycycline.  Cautioned her to return for any worsening symptoms.    4:43 PM:  I have discussed the diagnosis/risks/treatment options with the patient and believe the pt to be eligible for discharge home to follow-up with PCP. We also discussed returning to the ED immediately if new or worsening sx occur. We discussed the sx which are most concerning (e.g., sudden worsening pain, fever, inability to tolerate by mouth) that necessitate immediate return. Medications administered to the patient during their visit and any new prescriptions provided to the patient are listed below.  EIMI VINEY was evaluated in Emergency Department on 06/02/2018 for the symptoms described in the history of present illness. He/she was evaluated in the context of the global COVID-19 pandemic, which necessitated consideration that the patient might be at risk for infection with the SARS-CoV-2 virus that causes COVID-19. Institutional protocols and algorithms that pertain to the evaluation of patients at risk for COVID-19 are in a state of rapid change based on information released by regulatory bodies including the CDC and federal and state organizations. These policies and algorithms were followed during the patient's  care in the ED.   Medications given during this visit Medications  iohexol (OMNIPAQUE) 350 MG/ML injection 100 mL (100 mLs Intravenous Contrast Given 06/02/18 1557)     The patient appears reasonably screen and/or stabilized for discharge and I doubt any other medical condition or other [facility] requiring further screening, evaluation, or treatment in the ED at this time prior to discharge.    Final Clinical Impressions(s) / ED Diagnoses   Final diagnoses:  Viral pneumonia    ED Discharge Orders         Ordered    doxycycline (VIBRAMYCIN) 100 MG capsule  2 times daily     06/02/18 1641           Deno Etienne, DO 06/02/18 Fowler, [place], DO 06/02/18 1644

## 2018-06-03 NOTE — Telephone Encounter (Signed)
Called patient again to let her know we can set up phone visit  Phone just rang will try again later

## 2018-06-05 LAB — NOVEL CORONAVIRUS, NAA (HOSP ORDER, SEND-OUT TO REF LAB; TAT 18-24 HRS): SARS-CoV-2, NAA: NOT DETECTED

## 2018-06-07 ENCOUNTER — Other Ambulatory Visit: Payer: Self-pay

## 2018-06-07 ENCOUNTER — Emergency Department (HOSPITAL_BASED_OUTPATIENT_CLINIC_OR_DEPARTMENT_OTHER)
Admission: EM | Admit: 2018-06-07 | Discharge: 2018-06-07 | Disposition: A | Discharge status: Home / Self Care | Payer: Medicare Other | Attending: Emergency Medicine | Admitting: Emergency Medicine

## 2018-06-07 ENCOUNTER — Emergency Department (HOSPITAL_BASED_OUTPATIENT_CLINIC_OR_DEPARTMENT_OTHER): Payer: Medicare Other

## 2018-06-07 ENCOUNTER — Encounter (HOSPITAL_BASED_OUTPATIENT_CLINIC_OR_DEPARTMENT_OTHER): Payer: Self-pay | Admitting: Emergency Medicine

## 2018-06-07 DIAGNOSIS — S01511A Laceration without foreign body of lip, initial encounter: Secondary | ICD-10-CM | POA: Insufficient documentation

## 2018-06-07 DIAGNOSIS — R42 Dizziness and giddiness: Secondary | ICD-10-CM | POA: Insufficient documentation

## 2018-06-07 DIAGNOSIS — Z7982 Long term (current) use of aspirin: Secondary | ICD-10-CM | POA: Insufficient documentation

## 2018-06-07 DIAGNOSIS — W010XXA Fall on same level from slipping, tripping and stumbling without subsequent striking against object, initial encounter: Secondary | ICD-10-CM | POA: Insufficient documentation

## 2018-06-07 DIAGNOSIS — Y92003 Bedroom of unspecified non-institutional (private) residence as the place of occurrence of the external cause: Secondary | ICD-10-CM | POA: Diagnosis not present

## 2018-06-07 DIAGNOSIS — R55 Syncope and collapse: Secondary | ICD-10-CM | POA: Insufficient documentation

## 2018-06-07 DIAGNOSIS — Y999 Unspecified external cause status: Secondary | ICD-10-CM | POA: Insufficient documentation

## 2018-06-07 DIAGNOSIS — R739 Hyperglycemia, unspecified: Secondary | ICD-10-CM | POA: Diagnosis not present

## 2018-06-07 DIAGNOSIS — Z79899 Other long term (current) drug therapy: Secondary | ICD-10-CM | POA: Diagnosis not present

## 2018-06-07 DIAGNOSIS — I10 Essential (primary) hypertension: Secondary | ICD-10-CM | POA: Diagnosis not present

## 2018-06-07 DIAGNOSIS — Y939 Activity, unspecified: Secondary | ICD-10-CM | POA: Diagnosis not present

## 2018-06-07 DIAGNOSIS — S0181XA Laceration without foreign body of other part of head, initial encounter: Secondary | ICD-10-CM

## 2018-06-07 LAB — HEMOGLOBIN A1C
Hgb A1c MFr Bld: 6.6 % — ABNORMAL HIGH (ref 4.8–5.6)
Mean Plasma Glucose: 142.72 mg/dL

## 2018-06-07 LAB — BASIC METABOLIC PANEL
Anion gap: 8 (ref 5–15)
BUN: 32 mg/dL — ABNORMAL HIGH (ref 8–23)
CO2: 23 mmol/L (ref 22–32)
Calcium: 10.9 mg/dL — ABNORMAL HIGH (ref 8.9–10.3)
Chloride: 102 mmol/L (ref 98–111)
Creatinine, Ser: 1.34 mg/dL — ABNORMAL HIGH (ref 0.44–1.00)
GFR calc Af Amer: 41 mL/min — ABNORMAL LOW (ref >60)
GFR calc non Af Amer: 36 mL/min — ABNORMAL LOW (ref >60)
Glucose, Bld: 224 mg/dL — ABNORMAL HIGH (ref 70–99)
Potassium: 4 mmol/L (ref 3.5–5.1)
Sodium: 133 mmol/L — ABNORMAL LOW (ref 135–145)

## 2018-06-07 LAB — URINALYSIS, ROUTINE W REFLEX MICROSCOPIC
Bilirubin Urine: NEGATIVE
Glucose, UA: NEGATIVE mg/dL
Hgb urine dipstick: NEGATIVE
Ketones, ur: NEGATIVE mg/dL
Nitrite: NEGATIVE
Protein, ur: NEGATIVE mg/dL
Specific Gravity, Urine: 1.015 (ref 1.005–1.030)
pH: 6.5 (ref 5.0–8.0)

## 2018-06-07 LAB — CBC
HCT: 38.7 % (ref 36.0–46.0)
Hemoglobin: 12.7 g/dL (ref 12.0–15.0)
MCH: 31.9 pg (ref 26.0–34.0)
MCHC: 32.8 g/dL (ref 30.0–36.0)
MCV: 97.2 fL (ref 80.0–100.0)
Platelets: 338 10*3/uL (ref 150–400)
RBC: 3.98 MIL/uL (ref 3.87–5.11)
RDW: 13.6 % (ref 11.5–15.5)
WBC: 14.6 10*3/uL — ABNORMAL HIGH (ref 4.0–10.5)
nRBC: 0 % (ref 0.0–0.2)

## 2018-06-07 LAB — URINALYSIS, MICROSCOPIC (REFLEX)

## 2018-06-07 MED ORDER — LIDOCAINE-EPINEPHRINE (PF) 2 %-1:200000 IJ SOLN
10.0000 mL | Freq: Once | INTRAMUSCULAR | Status: AC
  Administered 2018-06-07: 10 mL
  Filled 2018-06-07 (×2): qty 10

## 2018-06-07 NOTE — ED Notes (Signed)
Patient transported to CT 

## 2018-06-07 NOTE — ED Provider Notes (Signed)
[facility] EMERGENCY DEPARTMENT Provider Note   CSN: [id number] Arrival date & time: 06/07/18  1411    History   Chief Complaint Chief Complaint  Patient presents with  . Fall    HPI Christine Henderson is a 85 y.o. female.     Patient presents to the emergency department today after an episode of lightheadedness causing a fall.  Patient fell on the carpet and sustained a laceration to her left upper lip.  She did not lose consciousness.  Currently she denies headache, neck pain, confusion.  She has not had any vomiting or vision changes.  No treatments prior to arrival.  Patient has been seen in the emergency department twice in the past week or so.  The first episode was for a syncopal episode.  The second time she was seen she had a CT scan of her chest which demonstrated a pneumonia for which she was started on doxycycline.  She had testing for coronavirus which returned negative today.  Patient states that she has not been drinking as much recently.  She denies any diarrhea or vomiting.  She does take lisinopril and atenolol for blood pressure.  No recent changes to these medications.  Onset of symptoms acute.  Course is constant.  Symptoms resolved.  Patient feels well at the current time.     Past Medical History:  Diagnosis Date  . Abdominal pain, unspecified site 06/26/2008  . ALLERGIC RHINITIS CAUSE UNSPECIFIED 02/02/2008  . Annual physical exam 12/26/2012  . Diarrhea 06/26/2008  . Fibrocystic breast disease   . Headache(784.0) 10/01/2006  . Hereditary and idiopathic peripheral neuropathy 01/10/2016  . HYPERLIPIDEMIA 10/01/2006  . HYPERTENSION 10/06/2007  . IBS (irritable bowel syndrome)   . Insomnia 11/14/2012  . Irritable bowel syndrome 09/10/2006  . Medicare annual wellness visit, subsequent 12/26/2012   Sees Dr Elinor Parkinson of opthamology.  MGM 5/14     . Metatarsal stress fracture 01/20/2016  . MIGRAINE HEADACHE 09/10/2006  . MVP (mitral valve prolapse)   .  Onychomycosis 07/09/2014  . OSTEOPENIA 09/10/2006  . Osteopenia 11/12/2012  . Palpitations 09/10/2006  . PHOTOPHOBIA 07/20/2009  . SKIN TAG 11/09/2006  . VAGINITIS, ATROPHIC 10/01/2006  . Vitamin D deficiency 07/14/2016    Patient Active Problem List   Diagnosis Date Noted  . Vitamin D deficiency 07/14/2016  . Hereditary and idiopathic peripheral neuropathy 01/10/2016  . Leg lesion 07/09/2014  . Paresthesia of foot, bilateral 07/09/2014  . Hyperglycemia 07/09/2014  . Onychomycosis 07/09/2014  . Lumbar back pain with radiculopathy affecting left lower extremity 11/13/2013  . Back pain 06/27/2013  . Preventative health care 12/26/2012  . Insomnia 11/14/2012  . ALLERGIC RHINITIS CAUSE UNSPECIFIED 02/02/2008  . Essential hypertension 10/06/2007  . Hyperlipidemia 10/01/2006  . VAGINITIS, ATROPHIC 10/01/2006  . Migraine headache 09/10/2006  . IRRITABLE BOWEL SYNDROME 09/10/2006  . Disorder of bone and cartilage 09/10/2006    Past Surgical History:  Procedure Laterality Date  . DENTAL SURGERY  09/2014  . ROTATOR CUFF REPAIR Right   . TONSILLECTOMY       OB History    Gravida  4   Para  4   Term  0   Preterm  0   AB  0   Living  0     SAB  0   TAB  0   Ectopic  0   Multiple  0   Live Births  Home Medications    Prior to Admission medications   Medication Sig Start Date End Date Taking? Authorizing Provider  Ascorbic Acid (VITAMIN C) 1000 MG tablet Take 1,000 mg by mouth daily.      [provider]  aspirin 81 MG tablet Take 81 mg by mouth daily.      [provider]  atenolol (TENORMIN) 25 MG tablet TAKE ONE-HALF TABLET BY  MOUTH DAILY 04/26/18   Mosie Lukes, MD  Calcium Carbonate-Vitamin D (CALCIUM PLUS VITAMIN D PO) Take 1,200 tablets by mouth daily.     [provider]  conjugated estrogens (PREMARIN) vaginal cream Place 0.5 g vaginally once a week.  11/11/10   Dorena Cookey, MD  doxycycline (VIBRAMYCIN) 100 MG  capsule Take 1 capsule (100 mg total) by mouth 2 (two) times daily. One po bid x 7 days 06/02/18   Deno Etienne, DO  fish oil-omega-3 fatty acids 1000 MG capsule Take 2 g by mouth daily.      [provider]  furosemide (LASIX) 20 MG tablet Take 1 tablet (20 mg total) by mouth daily. 06/01/18   Sherwood Gambler, MD  Glucosamine-Chondroit-Vit C-Mn (GLUCOSAMINE 1500 COMPLEX) CAPS Take 1,500 capsules by mouth 2 (two) times daily.      [provider]  loperamide (IMODIUM) 2 MG capsule Take 2 mg by mouth 4 (four) times daily as needed. For diarrhea    [provider]  losartan (COZAAR) 25 MG tablet TAKE 1 TABLET BY MOUTH  DAILY 12/28/17   Mosie Lukes, MD  Multiple Vitamin (MULTIVITAMIN) tablet Take 1 tablet by mouth daily.      [provider]  potassium chloride (K-DUR) 10 MEQ tablet Take 1 tablet (10 mEq total) by mouth daily. 06/01/18   Sherwood Gambler, MD  simvastatin (ZOCOR) 10 MG tablet TAKE 1 TABLET BY MOUTH AT  BEDTIME 01/19/18   Mosie Lukes, MD    Family History Family History  Problem Relation Age of Onset  . Inflammatory bowel disease Sister   . Alzheimer's disease Sister   . Pancreatic cancer Maternal Grandfather   . Alzheimer's disease Mother   . Hyperlipidemia Son   . Obesity Son   . Hyperlipidemia Son   . Heart disease Paternal Uncle   . Coronary artery disease Neg Hx     Social History Social History   Tobacco Use  . Smoking status: Never Smoker  . Smokeless tobacco: Never Used  Substance Use Topics  . Alcohol use: No  . Drug use: No     Allergies   Codeine   Review of Systems Review of Systems  Constitutional: Negative for fever.  HENT: Negative for rhinorrhea and sore throat.   Eyes: Negative for redness.  Respiratory: Negative for cough.   Cardiovascular: Negative for chest pain.  Gastrointestinal: Negative for abdominal pain, diarrhea, nausea and vomiting.  Genitourinary: Negative for dysuria.  Musculoskeletal:  Negative for myalgias and neck pain.  Skin: Positive for wound. Negative for rash.  Neurological: Positive for light-headedness. Negative for headaches.     Physical Exam Updated Vital Signs BP (!) 119/59 (BP Location: Right Arm)   Pulse 89   Temp 97.8 F (36.6 C) (Oral)   Resp (!) 22   Ht 5\' 3"  (1.6 m)   Wt 52.2 kg   SpO2 95%   BMI 20.37 kg/m   Physical Exam Vitals signs and nursing note reviewed.  Constitutional:      Appearance: She is well-developed.  HENT:  Head: Normocephalic. No raccoon eyes or Battle's sign.     Comments: Patient has a 2 cm, linear, non-gaping, clean through and through laceration of the left upper lip.  Mild oozing of blood.    Right Ear: Tympanic membrane, ear canal and external ear normal. No hemotympanum.     Left Ear: Tympanic membrane, ear canal and external ear normal. No hemotympanum.     Nose: Nose normal.     Mouth/Throat:     Pharynx: Uvula midline.  Eyes:     General: Lids are normal.        Right eye: No discharge.        Left eye: No discharge.     Extraocular Movements:     Right eye: No nystagmus.     Left eye: No nystagmus.     Conjunctiva/sclera: Conjunctivae normal.     Pupils: Pupils are equal, round, and reactive to light.     Comments: No visible hyphema noted  Neck:     Musculoskeletal: Normal range of motion and neck supple.  Cardiovascular:     Rate and Rhythm: Normal rate and regular rhythm.     Heart sounds: Normal heart sounds.  Pulmonary:     Effort: Pulmonary effort is normal.     Breath sounds: Normal breath sounds.  Abdominal:     Palpations: Abdomen is soft.     Tenderness: There is no abdominal tenderness.  Musculoskeletal:     Cervical back: She exhibits normal range of motion, no tenderness and no bony tenderness.     Thoracic back: She exhibits no tenderness and no bony tenderness.     Lumbar back: She exhibits no tenderness and no bony tenderness.  Skin:    General: Skin is warm and dry.   Neurological:     Mental Status: She is alert and oriented to person, place, and time.     GCS: GCS eye subscore is 4. GCS verbal subscore is 5. GCS motor subscore is 6.     Cranial Nerves: No cranial nerve deficit.     Sensory: No sensory deficit.     Coordination: Coordination normal.     Deep Tendon Reflexes: Reflexes are normal and symmetric.      ED Treatments / Results  Labs (all labs ordered are listed, but only abnormal results are displayed) Labs Reviewed  CBC - Abnormal; Notable for the following components:      Result Value   WBC 14.6 (*)    All other components within normal limits  BASIC METABOLIC PANEL - Abnormal; Notable for the following components:   Sodium 133 (*)    Glucose, Bld 224 (*)    BUN 32 (*)    Creatinine, Ser 1.34 (*)    Calcium 10.9 (*)    GFR calc non Af Amer 36 (*)    GFR calc Af Amer 41 (*)    All other components within normal limits  URINALYSIS, ROUTINE W REFLEX MICROSCOPIC - Abnormal; Notable for the following components:   Leukocytes,Ua SMALL (*)    All other components within normal limits  URINALYSIS, MICROSCOPIC (REFLEX) - Abnormal; Notable for the following components:   Bacteria, UA RARE (*)    All other components within normal limits  HEMOGLOBIN A1C    EKG EKG Interpretation  Date/Time:  Monday June 07 2018 15:11:13 EDT Ventricular Rate:  80 PR Interval:    QRS Duration: 91 QT Interval:  354 QTC Calculation: 409 R Axis:   44 Text  Interpretation:  Sinus rhythm Prolonged PR interval Borderline T abnormalities, anterior leads no significant change since June 02 2018 Confirmed by Sherwood Gambler [id number]) on 06/07/2018 3:20:40 PM   Radiology Ct Head Wo Contrast  Result Date: 06/07/2018 CLINICAL DATA:  Pain following fall.  Dizziness. EXAM: CT HEAD WITHOUT CONTRAST TECHNIQUE: Contiguous axial images were obtained from the base of the skull through the vertex without intravenous contrast. COMPARISON:  None. FINDINGS: Brain:  There is mild diffuse atrophy. There is no intracranial mass, hemorrhage, extra-axial fluid collection, or midline shift. There are foci of decreased attenuation in the centra semiovale bilaterally. No acute appearing infarct is demonstrable on this study. Vascular: There is no appreciable hyperdense vessel. There is calcification in each carotid siphon region. Skull: Bony calvarium appears intact. Sinuses/Orbits: There is a small retention cyst the anterior superior right sphenoid sinus region. There is slight mucosal thickening in several ethmoid air cells. Other visualized paranasal sinuses are clear. Visualized orbits appear symmetric bilaterally. Other: Mastoid air cells are clear. There is debris in each external auditory canal. IMPRESSION: Atrophy with patchy periventricular small vessel disease. No acute infarct evident. No mass or hemorrhage. Foci of arterial vascular calcification noted. There are areas of paranasal sinus disease. There is probable cerumen in each external auditory canal. Electronically Signed   By: Lowella Grip III M.D.   On: 06/07/2018 15:13    Procedures .[name] KitchenLaceration Repair Date/Time: 06/07/2018 4:17 PM Performed by: Carlisle Cater, PA-C Authorized by: Carlisle Cater, PA-C   Consent:    Consent obtained:  Verbal   Consent given by:  Patient   Risks discussed:  Pain   Alternatives discussed:  No treatment Anesthesia (see MAR for exact dosages):    Anesthesia method:  Local infiltration   Local anesthetic:  Lidocaine 2% WITH epi Laceration details:    Location:  Lip   Lip location:  Upper lip, full thickness (superior to left upper lip)   Vermilion border involved: no     Length (cm):  2 Repair type:    Repair type:  Simple Exploration:    Wound exploration: wound explored through full range of motion and entire depth of wound probed and visualized     Contaminated: no   Treatment:    Area cleansed with:  Shur-Clens   Amount of cleaning:  Standard Skin  repair:    Repair method:  Sutures   Suture size:  5-0   Suture material:  Nylon   Suture technique:  Simple interrupted   Number of sutures:  3 Approximation:    Approximation:  Close Post-procedure details:    Dressing:  Open (no dressing)   Patient tolerance of procedure:  Tolerated well, no immediate complications   (including critical care time)  Medications Ordered in ED Medications  lidocaine-EPINEPHrine (XYLOCAINE W/EPI) 2 %-1:200000 (PF) injection 10 mL (10 mLs Infiltration Given by Other 06/07/18 1512)     Initial Impression / Assessment and Plan / ED Course  I have reviewed the triage vital signs and the nursing notes.  Pertinent labs & imaging results that were available during my care of the patient were reviewed by me and considered in my medical decision making (see chart for details).        Patient seen and examined. Work-up initiated.  Patient with transient hypotension during orthostatic vital signs.  Patient is able to hydrate orally.  Vital signs reviewed and are as follows: BP (!) 119/59 (BP Location: Right Arm)   Pulse 89   Temp 97.8  F (36.6 C) (Oral)   Resp (!) 22   Ht 5\' 3"  (1.6 m)   Wt 52.2 kg   SpO2 95%   BMI 20.37 kg/m   4:16 PM wound repaired without any complications.  All results discussed with patient.  At this point, feel that it would be prudent to discontinue the patient's atenolol.  We will have her hydrate well at home and follow-up with her doctor.  She will continue treatment for pneumonia.  Her EKG is unchanged.    In addition, her blood sugars have been elevated recently.  No glycosuria on UA.  Will send hemoglobin A1c and ask PCP to follow-up on this.  Plan for discharged home at this time.  Will attempt to discuss results with patient's son.  4:38 PM Unable to contact son.   Note sent to PCP.  Hemoglobin A1c is pending.  Plan on discharging patient home after she has a ride.]  4:41 PM Patient counseled on wound care.  Patient counseled on need to return or see PCP/urgent care for suture removal in 5-7 days. Patient was urged to return to the Emergency Department urgently with worsening pain, swelling, expanding erythema especially if it streaks away from the affected area, fever, or if they have any other concerns. Patient verbalized understanding.    Final Clinical Impressions(s) / ED Diagnoses   Final diagnoses:  Postural dizziness with near syncope  Facial laceration, initial encounter  Blood glucose elevated   Dizziness: Ongoing, likely multifactorial.  Suspect element of dehydration.  Patient is able to rehydrate orally.  She does take Lasix.  Slight elevation in creatinine today which will need to be rechecked.  Blood sugars have also been elevated.  Mild orthostasis noted.  Normal head CT.  Will have patient hold atenolol at this time.  Would prefer if her blood pressures run a little high than low at this point to prevent injury from falls.  Facial laceration: Repaired without difficulty.  It is through and through.  Discussed making sure that she swishes her mouth with water after eating.    ED Discharge Orders    None       Carlisle Cater, [place] 06/07/18 1641    Sherwood Gambler, MD 06/09/18 1102

## 2018-06-07 NOTE — ED Notes (Signed)
Patient transported to X-ray, will get EKG after Pt returns. RN Adam informed

## 2018-06-07 NOTE — Discharge Instructions (Signed)
Please read and follow all provided instructions.  Your diagnoses today include:  1. Postural dizziness with near syncope   2. Facial laceration, initial encounter   3. Blood glucose elevated     Tests performed today include:  CT scan of your head that did not show any serious injury.  Blood counts and electrolytes -shows elevated blood sugar and signs of dehydration  Urine test  Vital signs. See below for your results today.   Medications prescribed:   None  Take any prescribed medications only as directed.  Home care instructions:  Follow any educational materials contained in this packet.  Please stop your atenolol until you can follow-up with your doctor.   Your blood sugar is also elevated today.  This may be a sign of diabetes.  This will take further evaluation by your doctor.  Make sure you are hydrating yourself well at home.  Follow-up instructions: Please follow-up with your primary care provider in the next 3 days for further evaluation of your symptoms.    You will need to have your sutures removed in 5-7 days.  Return instructions:  SEEK IMMEDIATE MEDICAL ATTENTION IF:  There is confusion or drowsiness (although children frequently become drowsy after injury).   You cannot awaken the injured person.   You have more than one episode of vomiting.   You notice dizziness or unsteadiness which is getting worse, or inability to walk.   You have convulsions or unconsciousness.   You experience severe, persistent headaches not relieved by Tylenol.  You cannot use arms or legs normally.   There are changes in pupil sizes. (This is the black center in the colored part of the eye)   There is clear or bloody discharge from the nose or ears.   You have change in speech, vision, swallowing, or understanding.   Localized weakness, numbness, tingling, or change in bowel or bladder control.  You have any other emergent concerns.  Additional  Information: You have had a head injury which does not appear to require admission at this time.  Your vital signs today were: BP (!) 119/59 (BP Location: Right Arm)    Pulse 89    Temp 97.8 F (36.6 C) (Oral)    Resp (!) 22    Ht 5\' 3"  (1.6 m)    Wt 52.2 kg    SpO2 95%    BMI 20.37 kg/m  If your blood pressure (BP) was elevated above 135/85 this visit, please have this repeated by your doctor within one month. --------------

## 2018-06-07 NOTE — ED Triage Notes (Addendum)
Per son, patient fell in bedroom after becoming lightheaded.  Denies LOC.  Denies use of blood thinners.  Laceration to left lip.  Patient from home.  Currently taking abx for viral pneumonia.  Tested negative for covid-19.  Son Remo [place]) would like call from provider as to plan of care and disposition.-[phone]

## 2018-06-10 ENCOUNTER — Ambulatory Visit (INDEPENDENT_AMBULATORY_CARE_PROVIDER_SITE_OTHER): Payer: Medicare Other | Admitting: Family Medicine

## 2018-06-10 ENCOUNTER — Other Ambulatory Visit: Payer: Self-pay

## 2018-06-10 DIAGNOSIS — S01511D Laceration without foreign body of lip, subsequent encounter: Secondary | ICD-10-CM | POA: Diagnosis not present

## 2018-06-10 DIAGNOSIS — R739 Hyperglycemia, unspecified: Secondary | ICD-10-CM | POA: Diagnosis not present

## 2018-06-10 DIAGNOSIS — I10 Essential (primary) hypertension: Secondary | ICD-10-CM

## 2018-06-10 DIAGNOSIS — S01511A Laceration without foreign body of lip, initial encounter: Secondary | ICD-10-CM | POA: Insufficient documentation

## 2018-06-10 DIAGNOSIS — B349 Viral infection, unspecified: Secondary | ICD-10-CM

## 2018-06-10 NOTE — Assessment & Plan Note (Signed)
She had sutures placed in lower lip after falling during a syncopal episode and falling in her home. She is healing well and has not had another fall. She will come in to office on 4/13 to have the sutures removed.

## 2018-06-10 NOTE — Assessment & Plan Note (Signed)
hgba1c acceptable, minimize simple carbs. Increase exercise as tolerated.  

## 2018-06-10 NOTE — Assessment & Plan Note (Signed)
Possible pneumonia CXR in ER on 4/1 showed Enlargement of cardiac silhouette with vascular congestion and probable mild pulmonary edema. Covid was negative and she is feeling much better, encouraged rest and increased hydration but also mobility as tolerated and deep breathing. Follow up as needed and on 4/13

## 2018-06-10 NOTE — Progress Notes (Signed)
Virtual Visit via Video Note  I connected with Christine Henderson on 06/10/18 at  8:40 AM EDT by a video enabled telemedicine application and verified that I am speaking with the correct person using two identifiers.   I discussed the limitations of evaluation and management by telemedicine and the availability of in person appointments. The patient expressed understanding and agreed to proceed. Christine Henderson, CMA was able to get patient set up on video platform. Patient's son was also present during the visit as well.     Subjective:    Patient ID: Christine Henderson, female    DOB: [birth year], 85 y.o.   MRN: [id number]  No chief complaint on file.   HPI Patient is in today for ER follow up. After suffering an episode of syncope and a lip laceration that required sutures on 4/1. She was acutely ill at that time. Possible pneumonia CXR in ER on 4/1 showed Enlargement of cardiac silhouette with vascular congestion and probable mild pulmonary edema. Covid was negative and she is feeling much better. No further falls or episodes of syncope  Past Medical History:  Diagnosis Date  . Abdominal pain, unspecified site 06/26/2008  . ALLERGIC RHINITIS CAUSE UNSPECIFIED 02/02/2008  . Annual physical exam 12/26/2012  . Diarrhea 06/26/2008  . Fibrocystic breast disease   . Headache(784.0) 10/01/2006  . Hereditary and idiopathic peripheral neuropathy 01/10/2016  . HYPERLIPIDEMIA 10/01/2006  . HYPERTENSION 10/06/2007  . IBS (irritable bowel syndrome)   . Insomnia 11/14/2012  . Irritable bowel syndrome 09/10/2006  . Medicare annual wellness visit, subsequent 12/26/2012   Sees Dr Elinor Parkinson of opthamology.  MGM 5/14     . Metatarsal stress fracture 01/20/2016  . MIGRAINE HEADACHE 09/10/2006  . MVP (mitral valve prolapse)   . Onychomycosis 07/09/2014  . OSTEOPENIA 09/10/2006  . Osteopenia 11/12/2012  . Palpitations 09/10/2006  . PHOTOPHOBIA 07/20/2009  . SKIN TAG 11/09/2006  . VAGINITIS, ATROPHIC 10/01/2006   . Vitamin D deficiency 07/14/2016    Past Surgical History:  Procedure Laterality Date  . DENTAL SURGERY  09/2014  . ROTATOR CUFF REPAIR Right   . TONSILLECTOMY      Family History  Problem Relation Age of Onset  . Inflammatory bowel disease Sister   . Alzheimer's disease Sister   . Pancreatic cancer Maternal Grandfather   . Alzheimer's disease Mother   . Hyperlipidemia Son   . Obesity Son   . Hyperlipidemia Son   . Heart disease Paternal Uncle   . Coronary artery disease Neg Hx     Social History   Socioeconomic History  . Marital status: Widowed    Spouse name: Not on file  . Number of children: 4  . Years of education: Not on file  . Highest education level: Not on file  Occupational History  . Occupation: retired    Fish farm manager: RETIRED  Social Needs  . Financial resource strain: Not on file  . Food insecurity:    Worry: Not on file    Inability: Not on file  . Transportation needs:    Medical: Not on file    Non-medical: Not on file  Tobacco Use  . Smoking status: Never Smoker  . Smokeless tobacco: Never Used  Substance and Sexual Activity  . Alcohol use: No  . Drug use: No  . Sexual activity: Never    Birth control/protection: None    Comment: regular exercise, lives at the [place], widowed in 2014  Lifestyle  . Physical activity:  Days per week: Not on file    Minutes per session: Not on file  . Stress: Not on file  Relationships  . Social connections:    Talks on phone: Not on file    Gets together: Not on file    Attends religious service: Not on file    Active member of club or organization: Not on file    Attends meetings of clubs or organizations: Not on file    Relationship status: Not on file  . Intimate partner violence:    Fear of current or ex partner: Not on file    Emotionally abused: Not on file    Physically abused: Not on file    Forced sexual activity: Not on file  Other Topics Concern  . Not on file  Social History  Narrative  . Not on file    Outpatient Medications Prior to Visit  Medication Sig Dispense Refill  . Ascorbic Acid (VITAMIN C) 1000 MG tablet Take 1,000 mg by mouth daily.      [name] Kitchen aspirin 81 MG tablet Take 81 mg by mouth daily.      [name] Kitchen atenolol (TENORMIN) 25 MG tablet TAKE ONE-HALF TABLET BY  MOUTH DAILY 45 tablet 2  . Calcium Carbonate-Vitamin D (CALCIUM PLUS VITAMIN D PO) Take 1,200 tablets by mouth daily.     [name] Kitchen conjugated estrogens (PREMARIN) vaginal cream Place 0.5 g vaginally once a week.     . doxycycline (VIBRAMYCIN) 100 MG capsule Take 1 capsule (100 mg total) by mouth 2 (two) times daily. One po bid x 7 days 14 capsule 0  . fish oil-omega-3 fatty acids 1000 MG capsule Take 2 g by mouth daily.      . furosemide (LASIX) 20 MG tablet Take 1 tablet (20 mg total) by mouth daily. 5 tablet 0  . Glucosamine-Chondroit-Vit C-Mn (GLUCOSAMINE 1500 COMPLEX) CAPS Take 1,500 capsules by mouth 2 (two) times daily.      [name] Kitchen loperamide (IMODIUM) 2 MG capsule Take 2 mg by mouth 4 (four) times daily as needed. For diarrhea    . losartan (COZAAR) 25 MG tablet TAKE 1 TABLET BY MOUTH  DAILY 90 tablet 1  . Multiple Vitamin (MULTIVITAMIN) tablet Take 1 tablet by mouth daily.      . potassium chloride (K-DUR) 10 MEQ tablet Take 1 tablet (10 mEq total) by mouth daily. 5 tablet 0  . simvastatin (ZOCOR) 10 MG tablet TAKE 1 TABLET BY MOUTH AT  BEDTIME 90 tablet 1   No facility-administered medications prior to visit.     Allergies  Allergen Reactions  . Codeine     REACTION: Nausea  Vomiting    Review of Systems  Constitutional: Positive for malaise/fatigue. Negative for chills and fever.  HENT: Negative for congestion.   Eyes: Negative for blurred vision.  Respiratory: Negative for shortness of breath.   Cardiovascular: Negative for chest pain, palpitations and leg swelling.  Gastrointestinal: Negative for abdominal pain, blood in stool and nausea.  Genitourinary: Negative for dysuria and frequency.   Musculoskeletal: Positive for falls and myalgias.  Skin: Negative for rash.  Neurological: Positive for dizziness and loss of consciousness. Negative for headaches.  Endo/Heme/Allergies: Negative for environmental allergies.  Psychiatric/Behavioral: Negative for depression. The patient is not nervous/anxious.        Objective:    Physical Exam Vitals signs reviewed.  Constitutional:      Appearance: Normal appearance. She is not ill-appearing.  HENT:     Head: Normocephalic and atraumatic.  Nose: Nose normal.  Pulmonary:     Effort: Pulmonary effort is normal.  Skin:    General: Skin is dry.     Comments: Sutures noted in lower lip, no surrounding fluctuance or discharge.  Neurological:     Mental Status: She is alert and oriented to person, place, and time.  Psychiatric:        Mood and Affect: Mood normal.        Behavior: Behavior normal.     There were no vitals taken for this visit. Wt Readings from Last 3 Encounters:  06/07/18 115 lb (52.2 kg)  05/31/18 118 lb (53.5 kg)  03/15/18 127 lb 3.2 oz (57.7 kg)    Diabetic Foot Exam - Simple   No data filed     Lab Results  Component Value Date   WBC 14.6 (H) 06/07/2018   HGB 12.7 06/07/2018   HCT 38.7 06/07/2018   PLT 338 06/07/2018   GLUCOSE 224 (H) 06/07/2018   CHOL 156 07/24/2017   TRIG 71.0 07/24/2017   HDL 61.30 07/24/2017   LDLCALC 81 07/24/2017   ALT 21 06/02/2018   AST 34 06/02/2018   NA 133 (L) 06/07/2018   K 4.0 06/07/2018   CL 102 06/07/2018   CREATININE 1.34 (H) 06/07/2018   BUN 32 (H) 06/07/2018   CO2 23 06/07/2018   TSH 3.15 07/24/2017   HGBA1C 6.6 (H) 06/07/2018    Lab Results  Component Value Date   TSH 3.15 07/24/2017   Lab Results  Component Value Date   WBC 14.6 (H) 06/07/2018   HGB 12.7 06/07/2018   HCT 38.7 06/07/2018   MCV 97.2 06/07/2018   PLT 338 06/07/2018   Lab Results  Component Value Date   NA 133 (L) 06/07/2018   K 4.0 06/07/2018   CO2 23 06/07/2018    GLUCOSE 224 (H) 06/07/2018   BUN 32 (H) 06/07/2018   CREATININE 1.34 (H) 06/07/2018   BILITOT 1.1 06/02/2018   ALKPHOS 58 06/02/2018   AST 34 06/02/2018   ALT 21 06/02/2018   PROT 7.6 06/02/2018   ALBUMIN 3.3 (L) 06/02/2018   CALCIUM 10.9 (H) 06/07/2018   ANIONGAP 8 06/07/2018   GFR 44.91 (L) 07/24/2017   Lab Results  Component Value Date   CHOL 156 07/24/2017   Lab Results  Component Value Date   HDL 61.30 07/24/2017   Lab Results  Component Value Date   LDLCALC 81 07/24/2017   Lab Results  Component Value Date   TRIG 71.0 07/24/2017   Lab Results  Component Value Date   CHOLHDL 3 07/24/2017   Lab Results  Component Value Date   HGBA1C 6.6 (H) 06/07/2018       Assessment & Plan:   Problem List Items Addressed This Visit    Essential hypertension    Well controlled, no changes to meds. Encouraged heart healthy diet such as the DASH diet and exercise as tolerated. They have been monitoring in her home      Hyperglycemia    hgba1c acceptable, minimize simple carbs. Increase exercise as tolerated.       Viral illness    Possible pneumonia CXR in ER on 4/1 showed Enlargement of cardiac silhouette with vascular congestion and probable mild pulmonary edema. Covid was negative and she is feeling much better, encouraged rest and increased hydration but also mobility as tolerated and deep breathing. Follow up as needed and on 4/13      Laceration of lower lip  She had sutures placed in lower lip after falling during a syncopal episode and falling in her home. She is healing well and has not had another fall. She will come in to office on 4/13 to have the sutures removed.          I am having Akyra Bouchie. [place] maintain her aspirin, Calcium Carbonate-Vitamin D (CALCIUM PLUS VITAMIN D PO), multivitamin, fish oil-omega-3 fatty acids, Glucosamine 1500 Complex, loperamide, vitamin C, conjugated estrogens, losartan, simvastatin, atenolol, furosemide, potassium  chloride, and doxycycline.  No orders of the defined types were placed in this encounter.   I discussed the assessment and treatment plan with the patient. The patient was provided an opportunity to ask questions and all were answered. The patient agreed with the plan and demonstrated an understanding of the instructions.   The patient was advised to call back or seek an in-person evaluation if the symptoms worsen or if the condition fails to improve as anticipated.  I provided 25 minutes of non-face-to-face time during this encounter.   Penni Homans, MD

## 2018-06-10 NOTE — Assessment & Plan Note (Signed)
Well controlled, no changes to meds. Encouraged heart healthy diet such as the DASH diet and exercise as tolerated. They have been monitoring in her home

## 2018-06-14 ENCOUNTER — Ambulatory Visit (INDEPENDENT_AMBULATORY_CARE_PROVIDER_SITE_OTHER): Payer: Medicare Other | Admitting: Family Medicine

## 2018-06-14 ENCOUNTER — Encounter: Payer: Self-pay | Admitting: Family Medicine

## 2018-06-14 ENCOUNTER — Other Ambulatory Visit: Payer: Self-pay

## 2018-06-14 VITALS — BP 132/85 | HR 78 | Temp 98.6°F | Resp 18 | Ht 63.0 in | Wt 120.0 lb

## 2018-06-14 DIAGNOSIS — Z4802 Encounter for removal of sutures: Secondary | ICD-10-CM

## 2018-06-14 NOTE — Progress Notes (Signed)
[facility] [address], [place], [place] [address] [phone]  Date:  06/14/2018   Name:  Christine Henderson   DOB:  September 15, 1930   MRN:  [id number]  PCP:  Mosie Lukes, MD    Chief Complaint: Suture / Staple Removal (upper lip)   History of Present Illness:  Christine Henderson is a 85 y.o. very pleasant female patient who presents with the following:  Seen in the ER 1 week ago with a laceration- here today to have a suture removal  She fell at home a week ago and cut her upper lip Her lip feels pretty ok- she can eat and drink ok She is not having significant pain  I also asked about her recent illness.  Rily feels that she is slowly but surely getting better.  Her respiratory symptoms are improving. She did test negative for COVID-19 2 weeks ago  Pulse Readings from Last 3 Encounters:  06/14/18 78  06/07/18 79  06/02/18 65   BP Readings from Last 3 Encounters:  06/14/18 132/85  06/07/18 113/73  06/02/18 136/69   Wt Readings from Last 3 Encounters:  06/14/18 120 lb (54.4 kg)  06/07/18 115 lb (52.2 kg)  05/31/18 118 lb (53.5 kg)     Patient Active Problem List   Diagnosis Date Noted  . Viral illness 06/10/2018  . Laceration of lower lip 06/10/2018  . Vitamin D deficiency 07/14/2016  . Hereditary and idiopathic peripheral neuropathy 01/10/2016  . Leg lesion 07/09/2014  . Paresthesia of foot, bilateral 07/09/2014  . Hyperglycemia 07/09/2014  . Onychomycosis 07/09/2014  . Lumbar back pain with radiculopathy affecting left lower extremity 11/13/2013  . Back pain 06/27/2013  . Preventative health care 12/26/2012  . Insomnia 11/14/2012  . ALLERGIC RHINITIS CAUSE UNSPECIFIED 02/02/2008  . Essential hypertension 10/06/2007  . Hyperlipidemia 10/01/2006  . VAGINITIS, ATROPHIC 10/01/2006  . Migraine headache 09/10/2006  . IRRITABLE BOWEL SYNDROME 09/10/2006  . Disorder of bone and cartilage 09/10/2006     Past Medical History:  Diagnosis Date  . Abdominal pain, unspecified site 06/26/2008  . ALLERGIC RHINITIS CAUSE UNSPECIFIED 02/02/2008  . Annual physical exam 12/26/2012  . Diarrhea 06/26/2008  . Fibrocystic breast disease   . Headache(784.0) 10/01/2006  . Hereditary and idiopathic peripheral neuropathy 01/10/2016  . HYPERLIPIDEMIA 10/01/2006  . HYPERTENSION 10/06/2007  . IBS (irritable bowel syndrome)   . Insomnia 11/14/2012  . Irritable bowel syndrome 09/10/2006  . Medicare annual wellness visit, subsequent 12/26/2012   Sees Dr Elinor Parkinson of opthamology.  MGM 5/14     . Metatarsal stress fracture 01/20/2016  . MIGRAINE HEADACHE 09/10/2006  . MVP (mitral valve prolapse)   . Onychomycosis 07/09/2014  . OSTEOPENIA 09/10/2006  . Osteopenia 11/12/2012  . Palpitations 09/10/2006  . PHOTOPHOBIA 07/20/2009  . SKIN TAG 11/09/2006  . VAGINITIS, ATROPHIC 10/01/2006  . Vitamin D deficiency 07/14/2016    Past Surgical History:  Procedure Laterality Date  . DENTAL SURGERY  09/2014  . ROTATOR CUFF REPAIR Right   . TONSILLECTOMY      Social History   Tobacco Use  . Smoking status: Never Smoker  . Smokeless tobacco: Never Used  Substance Use Topics  . Alcohol use: No  . Drug use: No    Family History  Problem Relation Age of Onset  . Inflammatory bowel disease Sister   . Alzheimer's disease Sister   . Pancreatic cancer Maternal Grandfather   .  Alzheimer's disease Mother   . Hyperlipidemia Son   . Obesity Son   . Hyperlipidemia Son   . Heart disease Paternal Uncle   . Coronary artery disease Neg Hx     Allergies  Allergen Reactions  . Codeine     REACTION: Nausea  Vomiting    Medication list has been reviewed and updated.  Current Outpatient Medications on File Prior to Visit  Medication Sig Dispense Refill  . Ascorbic Acid (VITAMIN C) 1000 MG tablet Take 1,000 mg by mouth daily.      [name] Kitchen aspirin 81 MG tablet Take 81 mg by mouth daily.      [name] Kitchen atenolol (TENORMIN) 25 MG  tablet TAKE ONE-HALF TABLET BY  MOUTH DAILY 45 tablet 2  . Calcium Carbonate-Vitamin D (CALCIUM PLUS VITAMIN D PO) Take 1,200 tablets by mouth daily.     [name] Kitchen conjugated estrogens (PREMARIN) vaginal cream Place 0.5 g vaginally once a week.     . doxycycline (VIBRAMYCIN) 100 MG capsule Take 1 capsule (100 mg total) by mouth 2 (two) times daily. One po bid x 7 days 14 capsule 0  . fish oil-omega-3 fatty acids 1000 MG capsule Take 2 g by mouth daily.      . furosemide (LASIX) 20 MG tablet Take 1 tablet (20 mg total) by mouth daily. 5 tablet 0  . Glucosamine-Chondroit-Vit C-Mn (GLUCOSAMINE 1500 COMPLEX) CAPS Take 1,500 capsules by mouth 2 (two) times daily.      [name] Kitchen loperamide (IMODIUM) 2 MG capsule Take 2 mg by mouth 4 (four) times daily as needed. For diarrhea    . losartan (COZAAR) 25 MG tablet TAKE 1 TABLET BY MOUTH  DAILY 90 tablet 1  . Multiple Vitamin (MULTIVITAMIN) tablet Take 1 tablet by mouth daily.      . potassium chloride (K-DUR) 10 MEQ tablet Take 1 tablet (10 mEq total) by mouth daily. 5 tablet 0  . simvastatin (ZOCOR) 10 MG tablet TAKE 1 TABLET BY MOUTH AT  BEDTIME 90 tablet 1   No current facility-administered medications on file prior to visit.     Review of Systems:  No current fever chills  Physical Examination: Vitals:   06/14/18 0821 06/14/18 0841  BP: (!) 152/80 132/85  Pulse: (!) 105 78  Resp: 18   Temp: 98.6 F (37 C)   SpO2: 95%    Vitals:   06/14/18 0821  Weight: 120 lb (54.4 kg)  Height: 5\' 3"  (1.6 m)   Body mass index is 21.26 kg/m. Ideal Body Weight: Weight in (lb) to have BMI = 25: 140.8  GEN: WDWN, NAD, Non-toxic, A & O x 3, older lady with petite build.  Looks well Well-healed laceration on the left aspect of her upper lip. HEENT: Atraumatic, Normocephalic. Neck supple. No masses, No LAD. Ears and Nose: No external deformity. CV: RRR, No M/G/R. No JVD. No thrill. No extra heart sounds. PULM: CTA B, no wheezes, crackles, rhonchi. No retractions. No  resp. distress. No accessory muscle use. EXTR: No c/c/e NEURO Normal gait.  PSYCH: Normally interactive. Conversant. Not depressed or anxious appearing.  Calm demeanor.   Verbal consent obtained.  I removed 3 simple interrupted sutures from her external left upper lip.  The wound appears to be well-healed.  I examined the inside of the upper lip, there is no apparent laceration  Assessment and Plan: Visit for suture removal  Removed 3 simple interrupted sutures as above.  Discussed wound care with the patient.  She may eat and  drink normally, avoid any trauma to the lip.  Asked her to let us know if any sign of infection or other concerns.  Signed Lamar Blinks, MD

## 2018-07-19 ENCOUNTER — Other Ambulatory Visit: Payer: Self-pay | Admitting: Family Medicine

## 2018-07-19 DIAGNOSIS — E785 Hyperlipidemia, unspecified: Secondary | ICD-10-CM

## 2018-07-19 DIAGNOSIS — I10 Essential (primary) hypertension: Secondary | ICD-10-CM

## 2018-08-20 ENCOUNTER — Other Ambulatory Visit: Payer: Self-pay

## 2018-08-20 NOTE — Patient Outreach (Signed)
Christine Henderson) Care Management  08/20/2018  Christine Henderson 1930-11-09 [id number]   Medication Adherence call to Christine Henderson HIPPA Compliant Voice message left with a call back number. Christine Henderson is showing past due on Simvastatin 10 mg and Losartan 25 mg under [facility].   [facility] Management Direct Dial [phone]  Fax [phone] [email or web address]

## 2018-09-02 ENCOUNTER — Other Ambulatory Visit: Payer: Self-pay

## 2018-09-02 NOTE — Patient Outreach (Signed)
[facility]) Care Management  09/02/2018  Christine Henderson 07/03/1930 [id number]   Medication Adherence call to [place] [facility] Voice message left with a call back number. [name] is showing past due under [facility].   [facility] Management Direct Dial [phone]  Fax [phone] [email or web address]

## 2018-09-27 ENCOUNTER — Telehealth: Payer: Self-pay | Admitting: Family Medicine

## 2018-09-27 ENCOUNTER — Other Ambulatory Visit: Payer: Self-pay

## 2018-09-27 ENCOUNTER — Ambulatory Visit (INDEPENDENT_AMBULATORY_CARE_PROVIDER_SITE_OTHER): Payer: Medicare Other | Admitting: Family Medicine

## 2018-09-27 DIAGNOSIS — I10 Essential (primary) hypertension: Secondary | ICD-10-CM | POA: Diagnosis not present

## 2018-09-27 DIAGNOSIS — R739 Hyperglycemia, unspecified: Secondary | ICD-10-CM | POA: Diagnosis not present

## 2018-09-27 DIAGNOSIS — M5416 Radiculopathy, lumbar region: Secondary | ICD-10-CM

## 2018-09-27 DIAGNOSIS — B351 Tinea unguium: Secondary | ICD-10-CM | POA: Diagnosis not present

## 2018-09-27 DIAGNOSIS — E785 Hyperlipidemia, unspecified: Secondary | ICD-10-CM

## 2018-09-27 DIAGNOSIS — B349 Viral infection, unspecified: Secondary | ICD-10-CM

## 2018-09-27 MED ORDER — SIMVASTATIN 10 MG PO TABS: 10.0000 mg | ORAL_TABLET | Freq: Every day | ORAL | 1 refills | Status: DC

## 2018-09-27 MED ORDER — LOSARTAN POTASSIUM 25 MG PO TABS: 12.5000 mg | ORAL_TABLET | Freq: Every day | ORAL | 1 refills | Status: DC

## 2018-09-27 MED ORDER — FUROSEMIDE 20 MG PO TABS: 20.0000 mg | ORAL_TABLET | Freq: Every day | ORAL | 0 refills | Status: DC

## 2018-09-27 MED ORDER — ESTROGENS, CONJUGATED 0.625 MG/GM VA CREA: 0.5000 g | TOPICAL_CREAM | VAGINAL | 1 refills | Status: DC

## 2018-09-27 NOTE — Assessment & Plan Note (Signed)
Well controlled, no changes to meds. Encouraged heart healthy diet such as the DASH diet and exercise as tolerated.  °

## 2018-09-27 NOTE — Patient Instructions (Signed)

## 2018-09-27 NOTE — Telephone Encounter (Signed)
LVM to schedule Return lab appt and nurse appt in mid september for flu shot, f/u Dr B 3 months from 09/27/18 appointment.

## 2018-09-27 NOTE — Assessment & Plan Note (Signed)
hgba1c acceptable, minimize simple carbs. Increase exercise as tolerated.  

## 2018-09-27 NOTE — Assessment & Plan Note (Signed)
Comes and goes, hurts at bed time but OTC Tylenol. Has some more symptoms to right hip.

## 2018-09-27 NOTE — Assessment & Plan Note (Signed)
Tolerating statin, encouraged heart healthy diet, avoid trans fats, minimize simple carbs and saturated fats. Increase exercise as tolerated 

## 2018-09-28 DIAGNOSIS — M25774 Osteophyte, right foot: Secondary | ICD-10-CM | POA: Diagnosis not present

## 2018-09-28 DIAGNOSIS — M79674 Pain in right toe(s): Secondary | ICD-10-CM | POA: Diagnosis not present

## 2018-09-28 DIAGNOSIS — M79675 Pain in left toe(s): Secondary | ICD-10-CM | POA: Diagnosis not present

## 2018-09-28 DIAGNOSIS — L600 Ingrowing nail: Secondary | ICD-10-CM | POA: Diagnosis not present

## 2018-09-28 DIAGNOSIS — M25775 Osteophyte, left foot: Secondary | ICD-10-CM | POA: Diagnosis not present

## 2018-09-29 NOTE — Progress Notes (Signed)
Virtual Visit via phone Note  I connected with Annice Jolly Propps on 09/27/18 at  2:00 PM EDT by a phone enabled telemedicine application and verified that I am speaking with the correct person using two identifiers. Magdalene Molly, CMA was able to get the patient set up on a visit via phone after being unable to set up a video visit.  Location: Patient: home Provider: office   I discussed the limitations of evaluation and management by telemedicine and the availability of in person appointments. The patient expressed understanding and agreed to proceed..    Subjective:    Patient ID: Christine Henderson, female    DOB: October 01, 1930, 85 y.o.   MRN: [id number]  No chief complaint on file.   HPI Patient is in today for follow up on chronic medical concerns including  Hypertension, hyperlipidemia and back pain. Her low back pain continues to worsen, no fall or trauma, no incontinence. She is noting a recent trip to the podiatrist to manage her thick toenails which is helpful. No recent febrile illness or hospitalizations. Denies CP/palp/SOB/HA/congestion/fevers/GI or GU c/o. Taking meds as prescribed  Past Medical History:  Diagnosis Date  . Abdominal pain, unspecified site 06/26/2008  . ALLERGIC RHINITIS CAUSE UNSPECIFIED 02/02/2008  . Annual physical exam 12/26/2012  . Diarrhea 06/26/2008  . Fibrocystic breast disease   . Headache(784.0) 10/01/2006  . Hereditary and idiopathic peripheral neuropathy 01/10/2016  . HYPERLIPIDEMIA 10/01/2006  . HYPERTENSION 10/06/2007  . IBS (irritable bowel syndrome)   . Insomnia 11/14/2012  . Irritable bowel syndrome 09/10/2006  . Medicare annual wellness visit, subsequent 12/26/2012   Sees Dr Elinor Parkinson of opthamology.  MGM 5/14     . Metatarsal stress fracture 01/20/2016  . MIGRAINE HEADACHE 09/10/2006  . MVP (mitral valve prolapse)   . Onychomycosis 07/09/2014  . OSTEOPENIA 09/10/2006  . Osteopenia 11/12/2012  . Palpitations 09/10/2006  . PHOTOPHOBIA  07/20/2009  . SKIN TAG 11/09/2006  . VAGINITIS, ATROPHIC 10/01/2006  . Vitamin D deficiency 07/14/2016    Past Surgical History:  Procedure Laterality Date  . DENTAL SURGERY  09/2014  . ROTATOR CUFF REPAIR Right   . TONSILLECTOMY      Family History  Problem Relation Age of Onset  . Inflammatory bowel disease Sister   . Alzheimer's disease Sister   . Pancreatic cancer Maternal Grandfather   . Alzheimer's disease Mother   . Hyperlipidemia Son   . Obesity Son   . Hyperlipidemia Son   . Heart disease Paternal Uncle   . Coronary artery disease Neg Hx     Social History   Socioeconomic History  . Marital status: Widowed    Spouse name: Not on file  . Number of children: 4  . Years of education: Not on file  . Highest education level: Not on file  Occupational History  . Occupation: retired    Fish farm manager: RETIRED  Social Needs  . Financial resource strain: Not on file  . Food insecurity    Worry: Not on file    Inability: Not on file  . Transportation needs    Medical: Not on file    Non-medical: Not on file  Tobacco Use  . Smoking status: Never Smoker  . Smokeless tobacco: Never Used  Substance and Sexual Activity  . Alcohol use: No  . Drug use: No  . Sexual activity: Never    Birth control/protection: None    Comment: regular exercise, lives at the [place], widowed in 2014  Lifestyle  . Physical  activity    Days per week: Not on file    Minutes per session: Not on file  . Stress: Not on file  Relationships  . Social Herbalist on phone: Not on file    Gets together: Not on file    Attends religious service: Not on file    Active member of club or organization: Not on file    Attends meetings of clubs or organizations: Not on file    Relationship status: Not on file  . Intimate partner violence    Fear of current or ex partner: Not on file    Emotionally abused: Not on file    Physically abused: Not on file    Forced sexual activity: Not on file   Other Topics Concern  . Not on file  Social History Narrative  . Not on file    Outpatient Medications Prior to Visit  Medication Sig Dispense Refill  . Ascorbic Acid (VITAMIN C) 1000 MG tablet Take 1,000 mg by mouth daily.      [name] Kitchen aspirin 81 MG tablet Take 81 mg by mouth daily.      . Calcium Carbonate-Vitamin D (CALCIUM PLUS VITAMIN D PO) Take 1,200 tablets by mouth daily.     . fish oil-omega-3 fatty acids 1000 MG capsule Take 2 g by mouth daily.      . Glucosamine-Chondroit-Vit C-Mn (GLUCOSAMINE 1500 COMPLEX) CAPS Take 1,500 capsules by mouth 2 (two) times daily.      [name] Kitchen loperamide (IMODIUM) 2 MG capsule Take 2 mg by mouth 4 (four) times daily as needed. For diarrhea    . Multiple Vitamin (MULTIVITAMIN) tablet Take 1 tablet by mouth daily.      [name] Kitchen atenolol (TENORMIN) 25 MG tablet TAKE ONE-HALF TABLET BY  MOUTH DAILY 45 tablet 2  . conjugated estrogens (PREMARIN) vaginal cream Place 0.5 g vaginally once a week.     . doxycycline (VIBRAMYCIN) 100 MG capsule Take 1 capsule (100 mg total) by mouth 2 (two) times daily. One po bid x 7 days 14 capsule 0  . furosemide (LASIX) 20 MG tablet Take 1 tablet (20 mg total) by mouth daily. 5 tablet 0  . losartan (COZAAR) 25 MG tablet TAKE 1 TABLET BY MOUTH  DAILY 90 tablet 1  . potassium chloride (K-DUR) 10 MEQ tablet Take 1 tablet (10 mEq total) by mouth daily. 5 tablet 0  . simvastatin (ZOCOR) 10 MG tablet TAKE 1 TABLET BY MOUTH AT  BEDTIME 90 tablet 1   No facility-administered medications prior to visit.     Allergies  Allergen Reactions  . Codeine     REACTION: Nausea  Vomiting    Review of Systems  Constitutional: Negative for fever and malaise/fatigue.  HENT: Positive for congestion.   Eyes: Negative for blurred vision.  Respiratory: Negative for shortness of breath.   Cardiovascular: Negative for chest pain, palpitations and leg swelling.  Gastrointestinal: Negative for abdominal pain, blood in stool and nausea.  Genitourinary:  Negative for dysuria and frequency.  Musculoskeletal: Positive for back pain and joint pain. Negative for falls.  Skin: Negative for rash.  Neurological: Negative for dizziness, loss of consciousness and headaches.  Endo/Heme/Allergies: Negative for environmental allergies.  Psychiatric/Behavioral: Negative for depression. The patient is not nervous/anxious.        Objective:    Physical Exam unable to obtain via phone  There were no vitals taken for this visit. Wt Readings from Last 3 Encounters:  06/14/18 120 lb (  54.4 kg)  06/07/18 115 lb (52.2 kg)  05/31/18 118 lb (53.5 kg)    Diabetic Foot Exam - Simple   No data filed     Lab Results  Component Value Date   WBC 14.6 (H) 06/07/2018   HGB 12.7 06/07/2018   HCT 38.7 06/07/2018   PLT 338 06/07/2018   GLUCOSE 224 (H) 06/07/2018   CHOL 156 07/24/2017   TRIG 71.0 07/24/2017   HDL 61.30 07/24/2017   LDLCALC 81 07/24/2017   ALT 21 06/02/2018   AST 34 06/02/2018   NA 133 (L) 06/07/2018   K 4.0 06/07/2018   CL 102 06/07/2018   CREATININE 1.34 (H) 06/07/2018   BUN 32 (H) 06/07/2018   CO2 23 06/07/2018   TSH 3.15 07/24/2017   HGBA1C 6.6 (H) 06/07/2018    Lab Results  Component Value Date   TSH 3.15 07/24/2017   Lab Results  Component Value Date   WBC 14.6 (H) 06/07/2018   HGB 12.7 06/07/2018   HCT 38.7 06/07/2018   MCV 97.2 06/07/2018   PLT 338 06/07/2018   Lab Results  Component Value Date   NA 133 (L) 06/07/2018   K 4.0 06/07/2018   CO2 23 06/07/2018   GLUCOSE 224 (H) 06/07/2018   BUN 32 (H) 06/07/2018   CREATININE 1.34 (H) 06/07/2018   BILITOT 1.1 06/02/2018   ALKPHOS 58 06/02/2018   AST 34 06/02/2018   ALT 21 06/02/2018   PROT 7.6 06/02/2018   ALBUMIN 3.3 (L) 06/02/2018   CALCIUM 10.9 (H) 06/07/2018   ANIONGAP 8 06/07/2018   GFR 44.91 (L) 07/24/2017   Lab Results  Component Value Date   CHOL 156 07/24/2017   Lab Results  Component Value Date   HDL 61.30 07/24/2017   Lab Results   Component Value Date   LDLCALC 81 07/24/2017   Lab Results  Component Value Date   TRIG 71.0 07/24/2017   Lab Results  Component Value Date   CHOLHDL 3 07/24/2017   Lab Results  Component Value Date   HGBA1C 6.6 (H) 06/07/2018       Assessment & Plan:   Problem List Items Addressed This Visit    Hyperlipidemia    Tolerating statin, encouraged heart healthy diet, avoid trans fats, minimize simple carbs and saturated fats. Increase exercise as tolerated      Relevant Medications   losartan (COZAAR) 25 MG tablet   simvastatin (ZOCOR) 10 MG tablet   Essential hypertension    Well controlled, no changes to meds. Encouraged heart healthy diet such as the DASH diet and exercise as tolerated.       Relevant Medications   losartan (COZAAR) 25 MG tablet   simvastatin (ZOCOR) 10 MG tablet   Lumbar back pain with radiculopathy affecting left lower extremity    Encouraged moist heat and gentle stretching as tolerated. May try NSAIDs and prescription meds as directed and report if symptoms worsen or seek immediate care. Will proceed with xray as pain is worsening       Relevant Orders   DG Lumbar Spine 2-3 Views   Hyperglycemia    hgba1c acceptable, minimize simple carbs. Increase exercise as tolerated.       Onychomycosis    Has seen podiatry and is doing well      Viral illness    Is feeling much better in this regard.         I have discontinued Jaylissa Felty. Lienemann's conjugated estrogens, atenolol, furosemide, potassium chloride, doxycycline,  conjugated estrogens, and furosemide. I have also changed her simvastatin. Additionally, I am having her maintain her aspirin, Calcium Carbonate-Vitamin D (CALCIUM PLUS VITAMIN D PO), multivitamin, fish oil-omega-3 fatty acids, Glucosamine 1500 Complex, loperamide, vitamin C, and losartan.  Meds ordered this encounter  Medications  . DISCONTD: conjugated estrogens (PREMARIN) vaginal cream    Sig: Place 3.50 Applicatorfuls  vaginally once a week.    Dispense:  42.5 g    Refill:  1  . DISCONTD: furosemide (LASIX) 20 MG tablet    Sig: Take 1 tablet (20 mg total) by mouth daily.    Dispense:  5 tablet    Refill:  0  . DISCONTD: losartan (COZAAR) 25 MG tablet    Sig: Take 0.5-1 tablets (12.5-25 mg total) by mouth daily.    Dispense:  90 tablet    Refill:  1  . losartan (COZAAR) 25 MG tablet    Sig: Take 0.5-1 tablets (12.5-25 mg total) by mouth daily.    Dispense:  90 tablet    Refill:  1  . simvastatin (ZOCOR) 10 MG tablet    Sig: Take 1 tablet (10 mg total) by mouth at bedtime.    Dispense:  90 tablet    Refill:  1     I discussed the assessment and treatment plan with the patient. The patient was provided an opportunity to ask questions and all were answered. The patient agreed with the plan and demonstrated an understanding of the instructions.   The patient was advised to call back or seek an in-person evaluation if the symptoms worsen or if the condition fails to improve as anticipated.  I provided 25 minutes of non-face-to-face time during this encounter.   Penni Homans, MD

## 2018-09-29 NOTE — Assessment & Plan Note (Addendum)
Encouraged moist heat and gentle stretching as tolerated. May try NSAIDs and prescription meds as directed and report if symptoms worsen or seek immediate care. Will proceed with xray as pain is worsening

## 2018-09-29 NOTE — Assessment & Plan Note (Signed)
Is feeling much better in this regard.

## 2018-09-29 NOTE — Assessment & Plan Note (Signed)
Has seen podiatry and is doing well

## 2018-10-07 ENCOUNTER — Other Ambulatory Visit: Payer: Self-pay

## 2018-10-07 ENCOUNTER — Ambulatory Visit (HOSPITAL_BASED_OUTPATIENT_CLINIC_OR_DEPARTMENT_OTHER)
Admission: RE | Admit: 2018-10-07 | Discharge: 2018-10-07 | Disposition: A | Discharge status: Home / Self Care | Payer: Medicare Other | Source: Ambulatory Visit | Attending: Family Medicine | Admitting: Family Medicine

## 2018-10-07 DIAGNOSIS — M5416 Radiculopathy, lumbar region: Secondary | ICD-10-CM | POA: Diagnosis not present

## 2018-10-07 DIAGNOSIS — M545 Low back pain: Secondary | ICD-10-CM | POA: Diagnosis not present

## 2018-11-11 ENCOUNTER — Other Ambulatory Visit (INDEPENDENT_AMBULATORY_CARE_PROVIDER_SITE_OTHER): Payer: Medicare Other

## 2018-11-11 ENCOUNTER — Other Ambulatory Visit: Payer: Self-pay | Admitting: Family Medicine

## 2018-11-11 ENCOUNTER — Other Ambulatory Visit: Payer: Self-pay

## 2018-11-11 ENCOUNTER — Ambulatory Visit (INDEPENDENT_AMBULATORY_CARE_PROVIDER_SITE_OTHER): Payer: Medicare Other

## 2018-11-11 DIAGNOSIS — E559 Vitamin D deficiency, unspecified: Secondary | ICD-10-CM

## 2018-11-11 DIAGNOSIS — I10 Essential (primary) hypertension: Secondary | ICD-10-CM

## 2018-11-11 DIAGNOSIS — E782 Mixed hyperlipidemia: Secondary | ICD-10-CM | POA: Diagnosis not present

## 2018-11-11 DIAGNOSIS — Z23 Encounter for immunization: Secondary | ICD-10-CM | POA: Diagnosis not present

## 2018-11-11 DIAGNOSIS — R739 Hyperglycemia, unspecified: Secondary | ICD-10-CM

## 2018-11-11 LAB — CBC
HCT: 37 % (ref 36.0–46.0)
Hemoglobin: 12.2 g/dL (ref 12.0–15.0)
MCHC: 33 g/dL (ref 30.0–36.0)
MCV: 96.4 fl (ref 78.0–100.0)
Platelets: 231 10*3/uL (ref 150.0–400.0)
RBC: 3.84 Mil/uL — ABNORMAL LOW (ref 3.87–5.11)
RDW: 14.6 % (ref 11.5–15.5)
WBC: 7.6 10*3/uL (ref 4.0–10.5)

## 2018-11-11 LAB — COMPREHENSIVE METABOLIC PANEL
ALT: 15 U/L (ref 0–35)
AST: 20 U/L (ref 0–37)
Albumin: 3.9 g/dL (ref 3.5–5.2)
Alkaline Phosphatase: 41 U/L (ref 39–117)
BUN: 34 mg/dL — ABNORMAL HIGH (ref 6–23)
CO2: 31 mEq/L (ref 19–32)
Calcium: 9.4 mg/dL (ref 8.4–10.5)
Chloride: 105 mEq/L (ref 96–112)
Creatinine, Ser: 0.98 mg/dL (ref 0.40–1.20)
GFR: 53.73 mL/min — ABNORMAL LOW (ref >60.00)
Glucose, Bld: 88 mg/dL (ref 70–99)
Potassium: 4.4 mEq/L (ref 3.5–5.1)
Sodium: 141 mEq/L (ref 135–145)
Total Bilirubin: 1 mg/dL (ref 0.2–1.2)
Total Protein: 6.8 g/dL (ref 6.0–8.3)

## 2018-11-11 LAB — LIPID PANEL
Cholesterol: 139 mg/dL (ref 0–200)
HDL: 57.1 mg/dL (ref >39.00)
LDL Cholesterol: 62 mg/dL (ref 0–99)
NonHDL: 81.58
Total CHOL/HDL Ratio: 2
Triglycerides: 98 mg/dL (ref 0.0–149.0)
VLDL: 19.6 mg/dL (ref 0.0–40.0)

## 2018-11-11 LAB — HEMOGLOBIN A1C: Hgb A1c MFr Bld: 6.2 % (ref 4.6–6.5)

## 2018-11-11 LAB — VITAMIN D 25 HYDROXY (VIT D DEFICIENCY, FRACTURES): VITD: 47.22 ng/mL (ref 30.00–100.00)

## 2018-11-11 LAB — TSH: TSH: 2.82 u[IU]/mL (ref 0.35–4.50)

## 2018-11-11 NOTE — Progress Notes (Signed)
Pt here today for flu vaccine.   Heather, CMA injected Fluad 0.62mL into R deltoid. Pt tolerated injection well.

## 2018-12-29 ENCOUNTER — Other Ambulatory Visit: Payer: Self-pay

## 2018-12-30 ENCOUNTER — Ambulatory Visit (INDEPENDENT_AMBULATORY_CARE_PROVIDER_SITE_OTHER): Payer: Medicare Other | Admitting: Family Medicine

## 2018-12-30 DIAGNOSIS — R739 Hyperglycemia, unspecified: Secondary | ICD-10-CM

## 2018-12-30 DIAGNOSIS — I10 Essential (primary) hypertension: Secondary | ICD-10-CM | POA: Diagnosis not present

## 2018-12-30 DIAGNOSIS — E559 Vitamin D deficiency, unspecified: Secondary | ICD-10-CM

## 2018-12-30 DIAGNOSIS — E782 Mixed hyperlipidemia: Secondary | ICD-10-CM

## 2018-12-30 NOTE — Assessment & Plan Note (Signed)
Supplement and monitor 

## 2018-12-30 NOTE — Patient Instructions (Addendum)
Pulse oximeter want oxygen in 90s. [place], [place], [facility], [employer] ...   Helps [place] figure out how sick you are if you get respiratory or gastrointestinal symptoms   Encouraged increased hydration and fiber in diet. Daily probiotics. If bowels not moving can use MOM 2 tbls po in 4 oz of warm prune juice by mouth every 2-3 days. If no results then repeat in 4 hours with  Dulcolax suppository pr, may repeat again in 4 more hours as needed. Seek care if symptoms worsen. Consider daily Miralax mixed with Benefiber once or twice daily and/or Dulcolax if symptoms persist.   Fiber gummies are another option  Take Vitamin D 1000 or 2000 IU daily  Multivitamin with minerals daily, selenium, zinc, vitamin c and vitamin d

## 2018-12-30 NOTE — Assessment & Plan Note (Signed)
Encouraged heart healthy diet, increase exercise, avoid trans fats, consider a krill oil cap daily 

## 2018-12-30 NOTE — Assessment & Plan Note (Signed)
Well controlled, no changes to meds. Encouraged heart healthy diet such as the DASH diet and exercise as tolerated.  °

## 2018-12-30 NOTE — Assessment & Plan Note (Signed)
hgba1c acceptable, minimize simple carbs. Increase exercise as tolerated.  

## 2019-01-02 NOTE — Progress Notes (Signed)
Subjective:    Patient ID: Christine Henderson, female    DOB: Jan 16, 1931, 85 y.o.   MRN: [id number]  No chief complaint on file.   HPI Patient is in today for follow up on chronic medical concerns including hyperlipidemia, hyperglycemia and hypertension. No polyuria or polydipsia. No recent febrile illness or hospitalizations. No acute concerns. She is maintaining quarantine well. Denies CP/palp/SOB/HA/congestion/fevers/GI or GU c/o. Taking meds as prescribed  Past Medical History:  Diagnosis Date  . Abdominal pain, unspecified site 06/26/2008  . ALLERGIC RHINITIS CAUSE UNSPECIFIED 02/02/2008  . Annual physical exam 12/26/2012  . Diarrhea 06/26/2008  . Fibrocystic breast disease   . Headache(784.0) 10/01/2006  . Hereditary and idiopathic peripheral neuropathy 01/10/2016  . HYPERLIPIDEMIA 10/01/2006  . HYPERTENSION 10/06/2007  . IBS (irritable bowel syndrome)   . Insomnia 11/14/2012  . Irritable bowel syndrome 09/10/2006  . Medicare annual wellness visit, subsequent 12/26/2012   Sees Dr Elinor Parkinson of opthamology.  MGM 5/14     . Metatarsal stress fracture 01/20/2016  . MIGRAINE HEADACHE 09/10/2006  . MVP (mitral valve prolapse)   . Onychomycosis 07/09/2014  . OSTEOPENIA 09/10/2006  . Osteopenia 11/12/2012  . Palpitations 09/10/2006  . PHOTOPHOBIA 07/20/2009  . SKIN TAG 11/09/2006  . VAGINITIS, ATROPHIC 10/01/2006  . Vitamin D deficiency 07/14/2016    Past Surgical History:  Procedure Laterality Date  . DENTAL SURGERY  09/2014  . ROTATOR CUFF REPAIR Right   . TONSILLECTOMY      Family History  Problem Relation Age of Onset  . Inflammatory bowel disease Sister   . Alzheimer's disease Sister   . Pancreatic cancer Maternal Grandfather   . Alzheimer's disease Mother   . Hyperlipidemia Son   . Obesity Son   . Hyperlipidemia Son   . Heart disease Paternal Uncle   . Coronary artery disease Neg Hx     Social History   Socioeconomic History  . Marital status: Widowed    Spouse  name: Not on file  . Number of children: 4  . Years of education: Not on file  . Highest education level: Not on file  Occupational History  . Occupation: retired    Fish farm manager: RETIRED  Social Needs  . Financial resource strain: Not on file  . Food insecurity    Worry: Not on file    Inability: Not on file  . Transportation needs    Medical: Not on file    Non-medical: Not on file  Tobacco Use  . Smoking status: Never Smoker  . Smokeless tobacco: Never Used  Substance and Sexual Activity  . Alcohol use: No  . Drug use: No  . Sexual activity: Never    Birth control/protection: None    Comment: regular exercise, lives at the [place], widowed in 2014  Lifestyle  . Physical activity    Days per week: Not on file    Minutes per session: Not on file  . Stress: Not on file  Relationships  . Social Herbalist on phone: Not on file    Gets together: Not on file    Attends religious service: Not on file    Active member of club or organization: Not on file    Attends meetings of clubs or organizations: Not on file    Relationship status: Not on file  . Intimate partner violence    Fear of current or ex partner: Not on file    Emotionally abused: Not on file    Physically  abused: Not on file    Forced sexual activity: Not on file  Other Topics Concern  . Not on file  Social History Narrative  . Not on file    Outpatient Medications Prior to Visit  Medication Sig Dispense Refill  . Ascorbic Acid (VITAMIN C) 1000 MG tablet Take 1,000 mg by mouth daily.      [name] Kitchen aspirin 81 MG tablet Take 81 mg by mouth daily.      . Calcium Carbonate-Vitamin D (CALCIUM PLUS VITAMIN D PO) Take 1,200 tablets by mouth daily.     . fish oil-omega-3 fatty acids 1000 MG capsule Take 2 g by mouth daily.      . Glucosamine-Chondroit-Vit C-Mn (GLUCOSAMINE 1500 COMPLEX) CAPS Take 1,500 capsules by mouth 2 (two) times daily.      [name] Kitchen loperamide (IMODIUM) 2 MG capsule Take 2 mg by mouth 4 (four)  times daily as needed. For diarrhea    . losartan (COZAAR) 25 MG tablet Take 0.5-1 tablets (12.5-25 mg total) by mouth daily. 90 tablet 1  . Multiple Vitamin (MULTIVITAMIN) tablet Take 1 tablet by mouth daily.      . simvastatin (ZOCOR) 10 MG tablet Take 1 tablet (10 mg total) by mouth at bedtime. 90 tablet 1   No facility-administered medications prior to visit.     Allergies  Allergen Reactions  . Codeine     REACTION: Nausea  Vomiting    Review of Systems  Constitutional: Negative for fever and malaise/fatigue.  HENT: Negative for congestion.   Eyes: Negative for blurred vision.  Respiratory: Negative for shortness of breath.   Cardiovascular: Negative for chest pain, palpitations and leg swelling.  Gastrointestinal: Negative for abdominal pain, blood in stool and nausea.  Genitourinary: Negative for dysuria and frequency.  Musculoskeletal: Negative for falls.  Skin: Negative for rash.  Neurological: Negative for dizziness, loss of consciousness and headaches.  Endo/Heme/Allergies: Negative for environmental allergies.  Psychiatric/Behavioral: Negative for depression. The patient is not nervous/anxious.        Objective:    Physical Exam Vitals signs and nursing note reviewed.  Constitutional:      General: She is not in acute distress.    Appearance: She is well-developed.  HENT:     Head: Normocephalic and atraumatic.     Nose: Nose normal.  Eyes:     General:        Right eye: No discharge.        Left eye: No discharge.  Neck:     Musculoskeletal: Normal range of motion and neck supple.  Cardiovascular:     Rate and Rhythm: Normal rate and regular rhythm.     Heart sounds: No murmur.  Pulmonary:     Effort: Pulmonary effort is normal.     Breath sounds: Normal breath sounds.  Abdominal:     General: Bowel sounds are normal.     Palpations: Abdomen is soft.     Tenderness: There is no abdominal tenderness.  Skin:    General: Skin is warm and dry.   Neurological:     Mental Status: She is alert and oriented to person, place, and time.     BP 140/80 (BP Location: Left Arm, Patient Position: Sitting, Cuff Size: Normal)   Pulse 82   Temp 98.5 F (36.9 C) (Temporal)   Resp 18   Wt 125 lb 6.4 oz (56.9 kg)   SpO2 97%   BMI 22.21 kg/m  Wt Readings from Last 3 Encounters:  12/30/18  125 lb 6.4 oz (56.9 kg)  06/14/18 120 lb (54.4 kg)  06/07/18 115 lb (52.2 kg)    Diabetic Foot Exam - Simple   No data filed     Lab Results  Component Value Date   WBC 7.6 11/11/2018   HGB 12.2 11/11/2018   HCT 37.0 11/11/2018   PLT 231.0 11/11/2018   GLUCOSE 88 11/11/2018   CHOL 139 11/11/2018   TRIG 98.0 11/11/2018   HDL 57.10 11/11/2018   LDLCALC 62 11/11/2018   ALT 15 11/11/2018   AST 20 11/11/2018   NA 141 11/11/2018   K 4.4 11/11/2018   CL 105 11/11/2018   CREATININE 0.98 11/11/2018   BUN 34 (H) 11/11/2018   CO2 31 11/11/2018   TSH 2.82 11/11/2018   HGBA1C 6.2 11/11/2018    Lab Results  Component Value Date   TSH 2.82 11/11/2018   Lab Results  Component Value Date   WBC 7.6 11/11/2018   HGB 12.2 11/11/2018   HCT 37.0 11/11/2018   MCV 96.4 11/11/2018   PLT 231.0 11/11/2018   Lab Results  Component Value Date   NA 141 11/11/2018   K 4.4 11/11/2018   CO2 31 11/11/2018   GLUCOSE 88 11/11/2018   BUN 34 (H) 11/11/2018   CREATININE 0.98 11/11/2018   BILITOT 1.0 11/11/2018   ALKPHOS 41 11/11/2018   AST 20 11/11/2018   ALT 15 11/11/2018   PROT 6.8 11/11/2018   ALBUMIN 3.9 11/11/2018   CALCIUM 9.4 11/11/2018   ANIONGAP 8 06/07/2018   GFR 53.73 (L) 11/11/2018   Lab Results  Component Value Date   CHOL 139 11/11/2018   Lab Results  Component Value Date   HDL 57.10 11/11/2018   Lab Results  Component Value Date   LDLCALC 62 11/11/2018   Lab Results  Component Value Date   TRIG 98.0 11/11/2018   Lab Results  Component Value Date   CHOLHDL 2 11/11/2018   Lab Results  Component Value Date   HGBA1C 6.2  11/11/2018       Assessment & Plan:   Problem List Items Addressed This Visit    Hyperlipidemia    Encouraged heart healthy diet, increase exercise, avoid trans fats, consider a krill oil cap daily      Essential hypertension    Well controlled, no changes to meds. Encouraged heart healthy diet such as the DASH diet and exercise as tolerated.       Hyperglycemia    hgba1c acceptable, minimize simple carbs. Increase exercise as tolerated      Vitamin D deficiency    Supplement and monitor         I am having Barnett Applebaum D. Turley maintain her aspirin, Calcium Carbonate-Vitamin D (CALCIUM PLUS VITAMIN D PO), multivitamin, fish oil-omega-3 fatty acids, Glucosamine 1500 Complex, loperamide, vitamin C, losartan, and simvastatin.  No orders of the defined types were placed in this encounter.    Penni Homans, MD

## 2019-01-19 DIAGNOSIS — Z1231 Encounter for screening mammogram for malignant neoplasm of breast: Secondary | ICD-10-CM | POA: Diagnosis not present

## 2019-02-07 ENCOUNTER — Other Ambulatory Visit: Payer: Self-pay | Admitting: Family Medicine

## 2019-02-07 DIAGNOSIS — I10 Essential (primary) hypertension: Secondary | ICD-10-CM

## 2019-02-07 DIAGNOSIS — E785 Hyperlipidemia, unspecified: Secondary | ICD-10-CM

## 2019-03-17 NOTE — Progress Notes (Signed)
Virtual Visit via Audio Note  I connected with patient on 03/18/19 at 10:15 AM EST by audio enabled telemedicine application and verified that I am speaking with the correct person using two identifiers.   THIS ENCOUNTER IS A VIRTUAL VISIT DUE TO COVID-19 - PATIENT WAS NOT SEEN IN THE OFFICE. PATIENT HAS CONSENTED TO VIRTUAL VISIT / TELEMEDICINE VISIT   Location of patient: home  Location of provider: office  I discussed the limitations of evaluation and management by telemedicine and the availability of in person appointments. The patient expressed understanding and agreed to proceed.   Subjective:   Christine Henderson is a 86 y.o. female who presents for Medicare Annual (Subsequent) preventive examination.  Review of Systems:  Home Safety/Smoke Alarms: Feels safe in home. Smoke alarms in place.  Lives alone in a 1 story home. Wears medical alert bracelet.  Female:    Mammo-pt reports done at [facility] 01/2019. Will request report. Pt reports it was normal. Dexa scan- 01/08/15. Ordered per pt request      Eyes- Dr.McCuen yearly. UTD per pt.     Objective:     Vitals: Unable to assess. This visit is enabled though telemedicine due to Covid 19.   Advanced Directives 03/18/2019 06/07/2018 06/02/2018 03/15/2018 01/13/2017 01/10/2016 12/30/2013  Does Patient Have a Medical Advance Directive? Yes Yes Yes Yes Yes Yes Yes  Type of Paramedic of [place];Living will [place];Living will [place];Living will [place];Living will [place];Living will Living will [place];Living will  Does patient want to make changes to medical advance directive? No - Patient declined - - No - Patient declined - No - Patient declined Yes - information given  Copy of [place] in Chart? Yes - validated most recent copy scanned in chart (See row information) Yes - validated most  recent copy scanned in chart (See row information) - Yes - validated most recent copy scanned in chart (See row information) Yes Yes No - copy requested    Tobacco Social History   Tobacco Use  Smoking Status Never Smoker  Smokeless Tobacco Never Used     Counseling given: Not Answered   Clinical Intake: Pain : No/denies pain     Past Medical History:  Diagnosis Date  . Abdominal pain, unspecified site 06/26/2008  . ALLERGIC RHINITIS CAUSE UNSPECIFIED 02/02/2008  . Annual physical exam 12/26/2012  . Diarrhea 06/26/2008  . Fibrocystic breast disease   . Headache(784.0) 10/01/2006  . Hereditary and idiopathic peripheral neuropathy 01/10/2016  . HYPERLIPIDEMIA 10/01/2006  . HYPERTENSION 10/06/2007  . IBS (irritable bowel syndrome)   . Insomnia 11/14/2012  . Irritable bowel syndrome 09/10/2006  . Medicare annual wellness visit, subsequent 12/26/2012   Sees Dr Elinor Parkinson of opthamology.  MGM 5/14     . Metatarsal stress fracture 01/20/2016  . MIGRAINE HEADACHE 09/10/2006  . MVP (mitral valve prolapse)   . Onychomycosis 07/09/2014  . OSTEOPENIA 09/10/2006  . Osteopenia 11/12/2012  . Palpitations 09/10/2006  . PHOTOPHOBIA 07/20/2009  . SKIN TAG 11/09/2006  . VAGINITIS, ATROPHIC 10/01/2006  . Vitamin D deficiency 07/14/2016   Past Surgical History:  Procedure Laterality Date  . DENTAL SURGERY  09/2014  . ROTATOR CUFF REPAIR Right   . TONSILLECTOMY     Family History  Problem Relation Age of Onset  . Inflammatory bowel disease Sister   . Alzheimer's disease Sister   . Pancreatic cancer Maternal Grandfather   .  Alzheimer's disease Mother   . Hyperlipidemia Son   . Obesity Son   . Hyperlipidemia Son   . Heart disease Paternal Uncle   . Coronary artery disease Neg Hx    Social History   Socioeconomic History  . Marital status: Widowed    Spouse name: Not on file  . Number of children: 4  . Years of education: Not on file  . Highest education level: Not on file    Occupational History  . Occupation: retired    Fish farm manager: RETIRED  Tobacco Use  . Smoking status: Never Smoker  . Smokeless tobacco: Never Used  Substance and Sexual Activity  . Alcohol use: No  . Drug use: No  . Sexual activity: Never    Birth control/protection: None    Comment: regular exercise, lives at the [place], widowed in 2014  Other Topics Concern  . Not on file  Social History Narrative  . Not on file   Social Determinants of Health   Financial Resource Strain:   . Difficulty of Paying Living Expenses: Not on file  Food Insecurity:   . Worried About Charity fundraiser in the Last Year: Not on file  . Ran Out of Food in the Last Year: Not on file  Transportation Needs:   . Lack of Transportation (Medical): Not on file  . Lack of Transportation (Non-Medical): Not on file  Physical Activity:   . Days of Exercise per Week: Not on file  . Minutes of Exercise per Session: Not on file  Stress:   . Feeling of Stress : Not on file  Social Connections:   . Frequency of Communication with Friends and Family: Not on file  . Frequency of Social Gatherings with Friends and Family: Not on file  . Attends Religious Services: Not on file  . Active Member of Clubs or Organizations: Not on file  . Attends Archivist Meetings: Not on file  . Marital Status: Not on file    Outpatient Encounter Medications as of 03/18/2019  Medication Sig  . Ascorbic Acid (VITAMIN C) 1000 MG tablet Take 1,000 mg by mouth daily.    [name] Kitchen aspirin 81 MG tablet Take 81 mg by mouth daily.    . Calcium Carbonate-Vitamin D (CALCIUM PLUS VITAMIN D PO) Take 1,200 tablets by mouth daily.   . fish oil-omega-3 fatty acids 1000 MG capsule Take 2 g by mouth daily.    . Glucosamine-Chondroit-Vit C-Mn (GLUCOSAMINE 1500 COMPLEX) CAPS Take 1,500 capsules by mouth 2 (two) times daily.    [name] Kitchen loperamide (IMODIUM) 2 MG capsule Take 2 mg by mouth 4 (four) times daily as needed. For diarrhea  . losartan  (COZAAR) 25 MG tablet TAKE 1/2 TO 1 TABLET BY  MOUTH DAILY  . Multiple Vitamin (MULTIVITAMIN) tablet Take 1 tablet by mouth daily.    . simvastatin (ZOCOR) 10 MG tablet TAKE 1 TABLET BY MOUTH AT  BEDTIME   No facility-administered encounter medications on file as of 03/18/2019.    Activities of Daily Living In your present state of health, do you have any difficulty performing the following activities: 03/18/2019  Hearing? N  Vision? N  Difficulty concentrating or making decisions? N  Walking or climbing stairs? N  Dressing or bathing? N  Doing errands, shopping? N  Preparing Food and eating ? N  Using the Toilet? N  In the past six months, have you accidently leaked urine? N  Do you have problems with loss of bowel control? N  Managing your Medications? N  Managing your Finances? N  Housekeeping or managing your Housekeeping? N  Some recent data might be hidden    Patient Care Team: Mosie Lukes, MD as PCP - General (Family Medicine)    Assessment:   This is a routine wellness examination for [facility]. Physical assessment deferred to PCP.  Exercise Activities and Dietary recommendations Current Exercise Habits: Home exercise routine, Type of exercise: walking, Time (Minutes): 15, Frequency (Times/Week): 3, Weekly Exercise (Minutes/Week): 45, Intensity: Mild, Exercise limited by: None identified   Diet (meal preparation, eat out, water intake, caffeinated beverages, dairy products, fruits and vegetables): 24 hr recall Breakfast: raisin bran, OJ Lunch: yogurt, granola bar Dinner:  BBQ, slaw, hushpuppies.  Goals    . Increase physical activity     Begin walking again.     . Maintain current health (pt-stated)       Fall Risk Fall Risk  03/18/2019 03/15/2018 01/13/2017 07/14/2016 01/10/2016  Falls in the past year? 1 0 No No No  Number falls in past yr: 0 - - - -  Injury with Fall? 0 - - - -  Comment - - - - -  Risk for fall due to : - - - - -  Risk for fall due to:  Comment - - - - -  Follow up Education provided;Falls prevention discussed - - - -   Depression Screen PHQ 2/9 Scores 03/18/2019 03/15/2018 01/13/2017 07/14/2016  PHQ - 2 Score 0 0 0 0     Cognitive Function Ad8 score reviewed for issues:  Issues making decisions:no  Less interest in hobbies / activities:no  Repeats questions, stories (family complaining):no  Trouble using ordinary gadgets (microwave, computer, phone):no  Forgets the month or year: no  Mismanaging finances: no  Remembering appts:no  Daily problems with thinking and/or memory:no Ad8 score is=0  MMSE - Mini Mental State Exam 01/13/2017  Orientation to time 5  Orientation to Place 5  Registration 3  Attention/ Calculation 5  Recall 3  Language- name 2 objects 2  Language- repeat 1  Language- follow 3 step command 3  Language- read & follow direction 1  Write a sentence 1  Copy design 1  Total score 30        Immunization History  Administered Date(s) Administered  . Fluad Quad(high Dose 65+) 11/11/2018  . Influenza Split 12/08/2011, 12/05/2015  . Influenza Whole 12/11/2006, 12/14/2007, 12/26/2008, 11/06/2009, 11/11/2010  . Influenza, High Dose Seasonal PF 01/08/2015, 12/04/2016  . Influenza,inj,Quad PF,6+ Mos 11/12/2012, 12/30/2013  . Influenza-Unspecified 12/01/2017  . Pneumococcal Conjugate-13 06/27/2013  . Pneumococcal Polysaccharide-23 03/03/2002, 10/06/2007  . Td 03/03/1997, 10/06/2007  . Tdap 09/27/2014  . Zoster 03/03/2014  . Zoster Recombinat (Shingrix) 01/07/2017    Screening Tests Health Maintenance  Topic Date Due  . MAMMOGRAM  01/09/2019  . TETANUS/TDAP  09/26/2024  . INFLUENZA VACCINE  Completed  . DEXA SCAN  Completed  . PNA vac Low Risk Adult  Completed        Plan:    Please schedule your next medicare wellness visit with me in 1 yr.  Continue to eat heart healthy diet (full of fruits, vegetables, whole grains, lean protein, water--limit salt, fat, and sugar intake)  and increase physical activity as tolerated.  Continue doing brain stimulating activities (puzzles, reading, adult coloring books, staying active) to keep memory sharp.   I have ordered a bone density scan as you requested. Please schedule.   I have personally reviewed and noted  the following in the patient's chart:   . Medical and social history . Use of alcohol, tobacco or illicit drugs  . Current medications and supplements . Functional ability and status . Nutritional status . Physical activity . Advanced directives . List of other physicians . Hospitalizations, surgeries, and ER visits in previous 12 months . Vitals . Screenings to include cognitive, depression, and falls . Referrals and appointments  In addition, I have reviewed and discussed with patient certain preventive protocols, quality metrics, and best practice recommendations. A written personalized care plan for preventive services as well as general preventive health recommendations were provided to patient.     Shela Nevin, [place]  03/18/2019

## 2019-03-18 ENCOUNTER — Encounter: Payer: Self-pay | Admitting: *Deleted

## 2019-03-18 ENCOUNTER — Other Ambulatory Visit: Payer: Self-pay

## 2019-03-18 ENCOUNTER — Ambulatory Visit (INDEPENDENT_AMBULATORY_CARE_PROVIDER_SITE_OTHER): Payer: Medicare Other | Admitting: *Deleted

## 2019-03-18 DIAGNOSIS — Z0000 Encounter for general adult medical examination without abnormal findings: Secondary | ICD-10-CM | POA: Diagnosis not present

## 2019-03-18 DIAGNOSIS — Z780 Asymptomatic menopausal state: Secondary | ICD-10-CM

## 2019-03-18 NOTE — Patient Instructions (Signed)
Please schedule your next medicare wellness visit with me in 1 yr.  Continue to eat heart healthy diet (full of fruits, vegetables, whole grains, lean protein, water--limit salt, fat, and sugar intake) and increase physical activity as tolerated.  Continue doing brain stimulating activities (puzzles, reading, adult coloring books, staying active) to keep memory sharp.   I have ordered a bone density scan as you requested. Please schedule.   [name] , Thank you for taking time to come for your Medicare Wellness Visit. I appreciate your ongoing commitment to your health goals. Please review the following plan we discussed and let me know if I can assist you in the future.   These are the goals we discussed: Goals    . Increase physical activity     Begin walking again.     . Maintain current health (pt-stated)       This is a list of the screening recommended for you and due dates:  Health Maintenance  Topic Date Due  . Mammogram  01/02/2020  . Tetanus Vaccine  09/26/2024  . Flu Shot  Completed  . DEXA scan (bone density measurement)  Completed  . Pneumonia vaccines  Completed    Preventive Care 84 Years and Older, Female Preventive care refers to lifestyle choices and visits with your health care provider that can promote health and wellness. This includes:  A yearly physical exam. This is also called an annual well check.  Regular dental and eye exams.  Immunizations.  Screening for certain conditions.  Healthy lifestyle choices, such as diet and exercise. What can I expect for my preventive care visit? Physical exam Your health care provider will check:  Height and weight. These may be used to calculate body mass index (BMI), which is a measurement that tells if you are at a healthy weight.  Heart rate and blood pressure.  Your skin for abnormal spots. Counseling Your health care provider may ask you questions about:  Alcohol, tobacco, and drug use.   Emotional well-being.  Home and relationship well-being.  Sexual activity.  Eating habits.  History of falls.  Memory and ability to understand (cognition).  Work and work Statistician.  Pregnancy and menstrual history. What immunizations do I need?  Influenza (flu) vaccine  This is recommended every year. Tetanus, diphtheria, and pertussis (Tdap) vaccine  You may need a Td booster every 10 years. Varicella (chickenpox) vaccine  You may need this vaccine if you have not already been vaccinated. Zoster (shingles) vaccine  You may need this after age 84. Pneumococcal conjugate (PCV13) vaccine  One dose is recommended after age 84. Pneumococcal polysaccharide (PPSV23) vaccine  One dose is recommended after age 84. Measles, mumps, and rubella (MMR) vaccine  You may need at least one dose of MMR if you were born in 1957 or later. You may also need a second dose. Meningococcal conjugate (MenACWY) vaccine  You may need this if you have certain conditions. Hepatitis A vaccine  You may need this if you have certain conditions or if you travel or work in places where you may be exposed to hepatitis A. Hepatitis B vaccine  You may need this if you have certain conditions or if you travel or work in places where you may be exposed to hepatitis B. Haemophilus influenzae type b (Hib) vaccine  You may need this if you have certain conditions. You may receive vaccines as individual doses or as more than one vaccine together in one shot (combination vaccines). Talk  with your health care provider about the risks and benefits of combination vaccines. What tests do I need? Blood tests  Lipid and cholesterol levels. These may be checked every 5 years, or more frequently depending on your overall health.  Hepatitis C test.  Hepatitis B test. Screening  Lung cancer screening. You may have this screening every year starting at age 84 if you have a 30-pack-year history of smoking  and currently smoke or have quit within the past 15 years.  Colorectal cancer screening. All adults should have this screening starting at age 84 and continuing until age 85. Your health care provider may recommend screening at age 20 if you are at increased risk. You will have tests every 1-10 years, depending on your results and the type of screening test.  Diabetes screening. This is done by checking your blood sugar (glucose) after you have not eaten for a while (fasting). You may have this done every 1-3 years.  Mammogram. This may be done every 1-2 years. Talk with your health care provider about how often you should have regular mammograms.  BRCA-related cancer screening. This may be done if you have a family history of breast, ovarian, tubal, or peritoneal cancers. Other tests  Sexually transmitted disease (STD) testing.  Bone density scan. This is done to screen for osteoporosis. You may have this done starting at age 84. Follow these instructions at home: Eating and drinking  Eat a diet that includes fresh fruits and vegetables, whole grains, lean protein, and low-fat dairy products. Limit your intake of foods with high amounts of sugar, saturated fats, and salt.  Take vitamin and mineral supplements as recommended by your health care provider.  Do not drink alcohol if your health care provider tells you not to drink.  If you drink alcohol: ? Limit how much you have to 0-1 drink a day. ? Be aware of how much alcohol is in your drink. In the [place] one drink equals one 12 oz bottle of beer (355 mL), one 5 oz glass of wine (148 mL), or one 1 oz glass of hard liquor (44 mL). Lifestyle  Take daily care of your teeth and gums.  Stay active. Exercise for at least 30 minutes on 5 or more days each week.  Do not use any products that contain nicotine or tobacco, such as cigarettes, e-cigarettes, and chewing tobacco. If you need help quitting, ask your health care provider.  If you  are sexually active, practice safe sex. Use a condom or other form of protection in order to prevent STIs (sexually transmitted infections).  Talk with your health care provider about taking a low-dose aspirin or statin. What's next?  Go to your health care provider once a year for a well check visit.  Ask your health care provider how often you should have your eyes and teeth checked.  Stay up to date on all vaccines. This information is not intended to replace advice given to you by your health care provider. Make sure you discuss any questions you have with your health care provider. Document Revised: 02/11/2018 Document Reviewed: 02/11/2018 Elsevier Patient Education  2020 [employer].

## 2019-03-22 DIAGNOSIS — H52203 Unspecified astigmatism, bilateral: Secondary | ICD-10-CM | POA: Diagnosis not present

## 2019-03-22 DIAGNOSIS — D3131 Benign neoplasm of right choroid: Secondary | ICD-10-CM | POA: Diagnosis not present

## 2019-03-22 DIAGNOSIS — Z961 Presence of intraocular lens: Secondary | ICD-10-CM | POA: Diagnosis not present

## 2019-03-29 ENCOUNTER — Other Ambulatory Visit: Payer: Self-pay

## 2019-03-29 ENCOUNTER — Ambulatory Visit (HOSPITAL_BASED_OUTPATIENT_CLINIC_OR_DEPARTMENT_OTHER)
Admission: RE | Admit: 2019-03-29 | Discharge: 2019-03-29 | Disposition: A | Discharge status: Home / Self Care | Payer: Medicare Other | Source: Ambulatory Visit | Attending: Family Medicine | Admitting: Family Medicine

## 2019-03-29 DIAGNOSIS — Z780 Asymptomatic menopausal state: Secondary | ICD-10-CM | POA: Diagnosis not present

## 2019-03-29 DIAGNOSIS — M8589 Other specified disorders of bone density and structure, multiple sites: Secondary | ICD-10-CM | POA: Diagnosis not present

## 2019-04-18 ENCOUNTER — Ambulatory Visit: Payer: Medicare Other | Attending: Internal Medicine

## 2019-04-18 ENCOUNTER — Other Ambulatory Visit: Payer: Self-pay

## 2019-04-18 DIAGNOSIS — Z23 Encounter for immunization: Secondary | ICD-10-CM | POA: Insufficient documentation

## 2019-04-18 NOTE — Progress Notes (Signed)
   Covid-19 Vaccination Clinic  Name:  Christine Henderson    MRN: [id number] DOB: 07/25/1930  04/18/2019  [name] was observed post Covid-19 immunization for 15 minutes without incidence. She was provided with Vaccine Information Sheet and instruction to access the V-Safe system.   [name] was instructed to call 911 with any severe reactions post vaccine: [name] Kitchen Difficulty breathing  . Swelling of your face and throat  . A fast heartbeat  . A bad rash all over your body  . Dizziness and weakness    Immunizations Administered    Name Date Dose VIS Date Route   Moderna COVID-19 Vaccine 04/18/2019  9:40 AM 0.5 mL 02/01/2019 Intramuscular   Manufacturer: Moderna   Lot: [id number]   [id number]

## 2019-05-17 ENCOUNTER — Ambulatory Visit: Payer: Medicare Other | Attending: Internal Medicine

## 2019-05-17 DIAGNOSIS — Z23 Encounter for immunization: Secondary | ICD-10-CM

## 2019-05-17 NOTE — Progress Notes (Signed)
   Covid-19 Vaccination Clinic  Name:  Christine Henderson    MRN: [id number] DOB: 1930/07/21  05/17/2019  [name] was observed post Covid-19 immunization for 15 minutes without incident. She was provided with Vaccine Information Sheet and instruction to access the V-Safe system.   [name] was instructed to call 911 with any severe reactions post vaccine: [name] Kitchen Difficulty breathing  . Swelling of face and throat  . A fast heartbeat  . A bad rash all over body  . Dizziness and weakness   Immunizations Administered    Name Date Dose VIS Date Route   Moderna COVID-19 Vaccine 05/17/2019  9:36 AM 0.5 mL 02/01/2019 Intramuscular   Manufacturer: Moderna   Lot: [id number]   [id number]

## 2019-05-29 ENCOUNTER — Other Ambulatory Visit: Payer: Self-pay | Admitting: Family Medicine

## 2019-05-29 DIAGNOSIS — I10 Essential (primary) hypertension: Secondary | ICD-10-CM

## 2019-08-10 ENCOUNTER — Encounter: Payer: Self-pay | Admitting: *Deleted

## 2019-09-22 ENCOUNTER — Other Ambulatory Visit: Payer: Self-pay | Admitting: Family Medicine

## 2019-09-22 DIAGNOSIS — I10 Essential (primary) hypertension: Secondary | ICD-10-CM

## 2020-02-14 ENCOUNTER — Other Ambulatory Visit: Payer: Self-pay | Admitting: Family Medicine

## 2020-02-14 DIAGNOSIS — I10 Essential (primary) hypertension: Secondary | ICD-10-CM

## 2020-02-14 DIAGNOSIS — E785 Hyperlipidemia, unspecified: Secondary | ICD-10-CM

## 2020-03-13 ENCOUNTER — Other Ambulatory Visit: Payer: Self-pay | Admitting: Family Medicine

## 2020-03-13 ENCOUNTER — Telehealth: Payer: Self-pay | Admitting: Family Medicine

## 2020-03-13 DIAGNOSIS — I10 Essential (primary) hypertension: Secondary | ICD-10-CM

## 2020-03-13 DIAGNOSIS — E785 Hyperlipidemia, unspecified: Secondary | ICD-10-CM

## 2020-03-13 MED ORDER — SIMVASTATIN 10 MG PO TABS: 10.0000 mg | ORAL_TABLET | Freq: Every day | ORAL | 1 refills | Status: DC

## 2020-03-13 MED ORDER — LOSARTAN POTASSIUM 25 MG PO TABS: 12.5000 mg | ORAL_TABLET | Freq: Every day | ORAL | 1 refills | Status: DC

## 2020-03-13 NOTE — Telephone Encounter (Signed)
Medication: simvastatin (ZOCOR) 10 MG tablet [[id number]]   losartan (COZAAR) 25 MG tablet [[id number]]   Has the patient contacted their pharmacy? No. (If no, request that the patient contact the pharmacy for the refill.) (If yes, when and what did the pharmacy advise?)  Preferred Pharmacy (with phone number or street name): [facility], [address], [address], [place], [place] [address]  Phone:  [phone] Fax:  [phone]   Agent: Please be advised that RX refills may take up to 3 business days. We ask that you follow-up with your pharmacy.

## 2020-03-13 NOTE — Telephone Encounter (Signed)
I sent them to [facility]

## 2020-03-13 NOTE — Telephone Encounter (Signed)
Is it ok to and these in

## 2020-03-13 NOTE — Telephone Encounter (Signed)
appt scheduled on May 01 2020

## 2020-03-14 NOTE — Telephone Encounter (Signed)
Pt aware of medication being sent in.

## 2020-03-27 DIAGNOSIS — Z961 Presence of intraocular lens: Secondary | ICD-10-CM | POA: Diagnosis not present

## 2020-03-27 DIAGNOSIS — D3131 Benign neoplasm of right choroid: Secondary | ICD-10-CM | POA: Diagnosis not present

## 2020-03-27 DIAGNOSIS — H52203 Unspecified astigmatism, bilateral: Secondary | ICD-10-CM | POA: Diagnosis not present

## 2020-04-19 DIAGNOSIS — Z1231 Encounter for screening mammogram for malignant neoplasm of breast: Secondary | ICD-10-CM | POA: Diagnosis not present

## 2020-04-19 LAB — HM MAMMOGRAPHY

## 2020-04-24 ENCOUNTER — Encounter: Payer: Self-pay | Admitting: *Deleted

## 2020-05-01 ENCOUNTER — Telehealth: Payer: Self-pay

## 2020-05-01 ENCOUNTER — Telehealth (INDEPENDENT_AMBULATORY_CARE_PROVIDER_SITE_OTHER): Payer: Medicare Other | Admitting: Family Medicine

## 2020-05-01 ENCOUNTER — Other Ambulatory Visit: Payer: Self-pay

## 2020-05-01 VITALS — Temp 98.7°F

## 2020-05-01 DIAGNOSIS — E782 Mixed hyperlipidemia: Secondary | ICD-10-CM | POA: Diagnosis not present

## 2020-05-01 DIAGNOSIS — I10 Essential (primary) hypertension: Secondary | ICD-10-CM | POA: Diagnosis not present

## 2020-05-01 DIAGNOSIS — E559 Vitamin D deficiency, unspecified: Secondary | ICD-10-CM | POA: Diagnosis not present

## 2020-05-01 DIAGNOSIS — R739 Hyperglycemia, unspecified: Secondary | ICD-10-CM | POA: Diagnosis not present

## 2020-05-01 NOTE — Telephone Encounter (Signed)
Attempted to call pt to get her scheduled for lab and BP check next week and f/u in 3 months

## 2020-05-06 NOTE — Assessment & Plan Note (Signed)
Encouraged heart healthy diet, increase exercise, avoid trans fats, consider a krill oil cap daily. Tolerating Simvastatin

## 2020-05-06 NOTE — Progress Notes (Signed)
Virtual Visit via Phone Note  I connected with Christine Henderson on3/1/22 at  3:00 PM EST by a phone enabled telemedicine application and verified that I am speaking with the correct person using two identifiers.  Location: Patient: home, patient and provider in visit Provider: home   I discussed the limitations of evaluation and management by telemedicine and the availability of in person appointments. The patient expressed understanding and agreed to proceed. S Chism, CMA  was able to get the patient set up on a phone visit after being unable to set up a video visit  Subjective:    Patient ID: ELICE CRIGGER, female    DOB: 05-28-1930, 87 y.o.   MRN: [id number]  Chief Complaint  Patient presents with  . Follow-up    HPI Patient is in today for follow up on chronic medical concerns. No recent illness or hospitalizations. She notes her blood pressure is low today and she is feeling a little light headed and weak. She has not been hydrating well but she denies any recent concerns or illness otherwise. She reports until today she has been feeling well. Denies CP/palp/SOB/HA/congestion/fevers/GI or GU c/o. Taking meds as prescribed  Past Medical History:  Diagnosis Date  . Abdominal pain, unspecified site 06/26/2008  . ALLERGIC RHINITIS CAUSE UNSPECIFIED 02/02/2008  . Annual physical exam 12/26/2012  . Diarrhea 06/26/2008  . Fibrocystic breast disease   . Headache(784.0) 10/01/2006  . Hereditary and idiopathic peripheral neuropathy 01/10/2016  . HYPERLIPIDEMIA 10/01/2006  . HYPERTENSION 10/06/2007  . IBS (irritable bowel syndrome)   . Insomnia 11/14/2012  . Irritable bowel syndrome 09/10/2006  . Medicare annual wellness visit, subsequent 12/26/2012   Sees Dr Elinor Parkinson of opthamology.  MGM 5/14     . Metatarsal stress fracture 01/20/2016  . MIGRAINE HEADACHE 09/10/2006  . MVP (mitral valve prolapse)   . Onychomycosis 07/09/2014  . OSTEOPENIA 09/10/2006  . Osteopenia 11/12/2012  .  Palpitations 09/10/2006  . PHOTOPHOBIA 07/20/2009  . SKIN TAG 11/09/2006  . VAGINITIS, ATROPHIC 10/01/2006  . Vitamin D deficiency 07/14/2016    Past Surgical History:  Procedure Laterality Date  . DENTAL SURGERY  09/2014  . ROTATOR CUFF REPAIR Right   . TONSILLECTOMY      Family History  Problem Relation Age of Onset  . Inflammatory bowel disease Sister   . Alzheimer's disease Sister   . Pancreatic cancer Maternal Grandfather   . Alzheimer's disease Mother   . Hyperlipidemia Son   . Obesity Son   . Hyperlipidemia Son   . Heart disease Paternal Uncle   . Coronary artery disease Neg Hx     Social History   Socioeconomic History  . Marital status: Widowed    Spouse name: Not on file  . Number of children: 4  . Years of education: Not on file  . Highest education level: Not on file  Occupational History  . Occupation: retired    Fish farm manager: RETIRED  Tobacco Use  . Smoking status: Never Smoker  . Smokeless tobacco: Never Used  Substance and Sexual Activity  . Alcohol use: No  . Drug use: No  . Sexual activity: Never    Birth control/protection: None    Comment: regular exercise, lives at the [place], widowed in 2014  Other Topics Concern  . Not on file  Social History Narrative  . Not on file   Social Determinants of Health   Financial Resource Strain: Not on file  Food Insecurity: Not on file  Transportation Needs:  Not on file  Physical Activity: Not on file  Stress: Not on file  Social Connections: Not on file  Intimate Partner Violence: Not on file    Outpatient Medications Prior to Visit  Medication Sig Dispense Refill  . Ascorbic Acid (VITAMIN C) 1000 MG tablet Take 1,000 mg by mouth daily.    . Calcium Carbonate-Vitamin D (CALCIUM PLUS VITAMIN D PO) Take 1,200 tablets by mouth daily.    . Glucosamine-Chondroit-Vit C-Mn (GLUCOSAMINE 1500 COMPLEX) CAPS Take 1,500 capsules by mouth 2 (two) times daily.    [name] Kitchen losartan (COZAAR) 25 MG tablet Take 0.5-1 tablets  (12.5-25 mg total) by mouth daily. 90 tablet 1  . Multiple Vitamin (MULTIVITAMIN) tablet Take 1 tablet by mouth daily.    . simvastatin (ZOCOR) 10 MG tablet Take 1 tablet (10 mg total) by mouth at bedtime. 90 tablet 1  . fish oil-omega-3 fatty acids 1000 MG capsule Take 2 g by mouth daily.      [name] Kitchen aspirin 81 MG tablet Take 81 mg by mouth daily.    [name] Kitchen loperamide (IMODIUM) 2 MG capsule Take 2 mg by mouth 4 (four) times daily as needed. For diarrhea     No facility-administered medications prior to visit.    Allergies  Allergen Reactions  . Codeine     REACTION: Nausea  Vomiting    Review of Systems  Constitutional: Positive for malaise/fatigue. Negative for fever.  HENT: Negative for congestion.   Eyes: Negative for blurred vision.  Respiratory: Negative for shortness of breath.   Cardiovascular: Negative for chest pain, palpitations and leg swelling.  Gastrointestinal: Negative for abdominal pain, blood in stool and nausea.  Genitourinary: Negative for dysuria and frequency.  Musculoskeletal: Negative for falls.  Skin: Negative for rash.  Neurological: Positive for dizziness. Negative for loss of consciousness and headaches.  Endo/Heme/Allergies: Negative for environmental allergies.  Psychiatric/Behavioral: Negative for depression. The patient is not nervous/anxious.        Objective:    Physical Exam unable to obtain  Temp 98.7 F (37.1 C)  Wt Readings from Last 3 Encounters:  07/25/19 120 lb (54.4 kg)  12/30/18 125 lb 6.4 oz (56.9 kg)  06/14/18 120 lb (54.4 kg)    Diabetic Foot Exam - Simple   No data filed    Lab Results  Component Value Date   WBC 7.6 11/11/2018   HGB 12.2 11/11/2018   HCT 37.0 11/11/2018   PLT 231.0 11/11/2018   GLUCOSE 88 11/11/2018   CHOL 139 11/11/2018   TRIG 98.0 11/11/2018   HDL 57.10 11/11/2018   LDLCALC 62 11/11/2018   ALT 15 11/11/2018   AST 20 11/11/2018   NA 141 11/11/2018   K 4.4 11/11/2018   CL 105 11/11/2018    CREATININE 0.98 11/11/2018   BUN 34 (H) 11/11/2018   CO2 31 11/11/2018   TSH 2.82 11/11/2018   HGBA1C 6.2 11/11/2018    Lab Results  Component Value Date   TSH 2.82 11/11/2018   Lab Results  Component Value Date   WBC 7.6 11/11/2018   HGB 12.2 11/11/2018   HCT 37.0 11/11/2018   MCV 96.4 11/11/2018   PLT 231.0 11/11/2018   Lab Results  Component Value Date   NA 141 11/11/2018   K 4.4 11/11/2018   CO2 31 11/11/2018   GLUCOSE 88 11/11/2018   BUN 34 (H) 11/11/2018   CREATININE 0.98 11/11/2018   BILITOT 1.0 11/11/2018   ALKPHOS 41 11/11/2018   AST 20 11/11/2018  ALT 15 11/11/2018   PROT 6.8 11/11/2018   ALBUMIN 3.9 11/11/2018   CALCIUM 9.4 11/11/2018   ANIONGAP 8 06/07/2018   GFR 53.73 (L) 11/11/2018   Lab Results  Component Value Date   CHOL 139 11/11/2018   Lab Results  Component Value Date   HDL 57.10 11/11/2018   Lab Results  Component Value Date   LDLCALC 62 11/11/2018   Lab Results  Component Value Date   TRIG 98.0 11/11/2018   Lab Results  Component Value Date   CHOLHDL 2 11/11/2018   Lab Results  Component Value Date   HGBA1C 6.2 11/11/2018       Assessment & Plan:   Problem List Items Addressed This Visit    Hyperlipidemia - Primary    Encouraged heart healthy diet, increase exercise, avoid trans fats, consider a krill oil cap daily. Tolerating Simvastatin      Relevant Orders   Lipid panel   Essential hypertension    She reports it is generally well controlled but today when she checked it for her visit it was 91/60 and she has felt a little weak and light headed today. She will hold her blood pressure meds today and if still low again tomorrow. She acknowledges she has not been eating well or hydrating well. She agrees to increase her hydration and eat protein every 4-5 hours. Report if symptoms do not resolve      Relevant Orders   CBC   Comprehensive metabolic panel   TSH   Hyperglycemia    hgba1c acceptable, minimize simple  carbs. Increase exercise as tolerated.       Relevant Orders   Hemoglobin A1c   Vitamin D deficiency    Supplement and monitor      Relevant Orders   VITAMIN D 25 Hydroxy (Vit-D Deficiency, Fractures)      I have discontinued Leona Singleton. Bucaro's aspirin and loperamide. I am also having her maintain her Calcium Carbonate-Vitamin D (CALCIUM PLUS VITAMIN D PO), multivitamin, fish oil-omega-3 fatty acids, Glucosamine 1500 Complex, vitamin C, simvastatin, and losartan.  No orders of the defined types were placed in this encounter. I discussed the assessment and treatment plan with the patient. The patient was provided an opportunity to ask questions and all were answered. The patient agreed with the plan and demonstrated an understanding of the instructions.   The patient was advised to call back or seek an in-person evaluation if the symptoms worsen or if the condition fails to improve as anticipated.  I provided 25 minutes of non-face-to-face time during this encounter.    Penni Homans, MD

## 2020-05-06 NOTE — Assessment & Plan Note (Signed)
Supplement and monitor 

## 2020-05-06 NOTE — Assessment & Plan Note (Signed)
She reports it is generally well controlled but today when she checked it for her visit it was 91/60 and she has felt a little weak and light headed today. She will hold her blood pressure meds today and if still low again tomorrow. She acknowledges she has not been eating well or hydrating well. She agrees to increase her hydration and eat protein every 4-5 hours. Report if symptoms do not resolve

## 2020-05-06 NOTE — Assessment & Plan Note (Signed)
hgba1c acceptable, minimize simple carbs. Increase exercise as tolerated.  

## 2020-05-08 ENCOUNTER — Other Ambulatory Visit (INDEPENDENT_AMBULATORY_CARE_PROVIDER_SITE_OTHER): Payer: Medicare Other

## 2020-05-08 ENCOUNTER — Other Ambulatory Visit: Payer: Self-pay

## 2020-05-08 DIAGNOSIS — E782 Mixed hyperlipidemia: Secondary | ICD-10-CM | POA: Diagnosis not present

## 2020-05-08 DIAGNOSIS — R739 Hyperglycemia, unspecified: Secondary | ICD-10-CM

## 2020-05-08 DIAGNOSIS — I10 Essential (primary) hypertension: Secondary | ICD-10-CM | POA: Diagnosis not present

## 2020-05-08 DIAGNOSIS — E559 Vitamin D deficiency, unspecified: Secondary | ICD-10-CM | POA: Diagnosis not present

## 2020-05-08 LAB — LIPID PANEL
Cholesterol: 157 mg/dL (ref 0–200)
HDL: 68.4 mg/dL (ref >39.00)
LDL Cholesterol: 73 mg/dL (ref 0–99)
NonHDL: 89.05
Total CHOL/HDL Ratio: 2
Triglycerides: 78 mg/dL (ref 0.0–149.0)
VLDL: 15.6 mg/dL (ref 0.0–40.0)

## 2020-05-08 LAB — CBC
HCT: 37.6 % (ref 36.0–46.0)
Hemoglobin: 12.8 g/dL (ref 12.0–15.0)
MCHC: 34 g/dL (ref 30.0–36.0)
MCV: 95.8 fl (ref 78.0–100.0)
Platelets: 251 10*3/uL (ref 150.0–400.0)
RBC: 3.93 Mil/uL (ref 3.87–5.11)
RDW: 13.6 % (ref 11.5–15.5)
WBC: 6.7 10*3/uL (ref 4.0–10.5)

## 2020-05-08 LAB — COMPREHENSIVE METABOLIC PANEL
ALT: 18 U/L (ref 0–35)
AST: 26 U/L (ref 0–37)
Albumin: 4.1 g/dL (ref 3.5–5.2)
Alkaline Phosphatase: 43 U/L (ref 39–117)
BUN: 24 mg/dL — ABNORMAL HIGH (ref 6–23)
CO2: 31 mEq/L (ref 19–32)
Calcium: 9.9 mg/dL (ref 8.4–10.5)
Chloride: 104 mEq/L (ref 96–112)
Creatinine, Ser: 0.99 mg/dL (ref 0.40–1.20)
GFR: 51.04 mL/min — ABNORMAL LOW (ref >60.00)
Glucose, Bld: 82 mg/dL (ref 70–99)
Potassium: 4.1 mEq/L (ref 3.5–5.1)
Sodium: 142 mEq/L (ref 135–145)
Total Bilirubin: 1.1 mg/dL (ref 0.2–1.2)
Total Protein: 6.8 g/dL (ref 6.0–8.3)

## 2020-05-08 LAB — VITAMIN D 25 HYDROXY (VIT D DEFICIENCY, FRACTURES): VITD: 62.49 ng/mL (ref 30.00–100.00)

## 2020-05-08 LAB — TSH: TSH: 3.02 u[IU]/mL (ref 0.35–4.50)

## 2020-05-08 LAB — HEMOGLOBIN A1C: Hgb A1c MFr Bld: 6 % (ref 4.6–6.5)

## 2020-07-24 ENCOUNTER — Emergency Department (HOSPITAL_BASED_OUTPATIENT_CLINIC_OR_DEPARTMENT_OTHER)
Admission: EM | Admit: 2020-07-24 | Discharge: 2020-07-24 | Disposition: A | Discharge status: Home / Self Care | Payer: Medicare Other | Attending: Emergency Medicine | Admitting: Emergency Medicine

## 2020-07-24 ENCOUNTER — Emergency Department (HOSPITAL_BASED_OUTPATIENT_CLINIC_OR_DEPARTMENT_OTHER): Payer: Medicare Other

## 2020-07-24 ENCOUNTER — Other Ambulatory Visit: Payer: Self-pay

## 2020-07-24 ENCOUNTER — Encounter (HOSPITAL_BASED_OUTPATIENT_CLINIC_OR_DEPARTMENT_OTHER): Payer: Self-pay | Admitting: Emergency Medicine

## 2020-07-24 DIAGNOSIS — R059 Cough, unspecified: Secondary | ICD-10-CM | POA: Insufficient documentation

## 2020-07-24 DIAGNOSIS — R0981 Nasal congestion: Secondary | ICD-10-CM | POA: Insufficient documentation

## 2020-07-24 DIAGNOSIS — R42 Dizziness and giddiness: Secondary | ICD-10-CM | POA: Diagnosis not present

## 2020-07-24 DIAGNOSIS — I517 Cardiomegaly: Secondary | ICD-10-CM | POA: Diagnosis not present

## 2020-07-24 DIAGNOSIS — Z79899 Other long term (current) drug therapy: Secondary | ICD-10-CM | POA: Insufficient documentation

## 2020-07-24 DIAGNOSIS — Z20822 Contact with and (suspected) exposure to covid-19: Secondary | ICD-10-CM | POA: Insufficient documentation

## 2020-07-24 DIAGNOSIS — I10 Essential (primary) hypertension: Secondary | ICD-10-CM | POA: Diagnosis not present

## 2020-07-24 LAB — RESP PANEL BY RT-PCR (FLU A&B, COVID) ARPGX2
Influenza A by PCR: NEGATIVE
Influenza B by PCR: NEGATIVE
SARS Coronavirus 2 by RT PCR: NEGATIVE

## 2020-07-24 LAB — CBC WITH DIFFERENTIAL/PLATELET
Abs Immature Granulocytes: 0.02 10*3/uL (ref 0.00–0.07)
Basophils Absolute: 0 10*3/uL (ref 0.0–0.1)
Basophils Relative: 1 %
Eosinophils Absolute: 0.2 10*3/uL (ref 0.0–0.5)
Eosinophils Relative: 2 %
HCT: 37.5 % (ref 36.0–46.0)
Hemoglobin: 12.6 g/dL (ref 12.0–15.0)
Immature Granulocytes: 0 %
Lymphocytes Relative: 13 %
Lymphs Abs: 1.1 10*3/uL (ref 0.7–4.0)
MCH: 32.1 pg (ref 26.0–34.0)
MCHC: 33.6 g/dL (ref 30.0–36.0)
MCV: 95.7 fL (ref 80.0–100.0)
Monocytes Absolute: 0.5 10*3/uL (ref 0.1–1.0)
Monocytes Relative: 6 %
Neutro Abs: 6.6 10*3/uL (ref 1.7–7.7)
Neutrophils Relative %: 78 %
Platelets: 233 10*3/uL (ref 150–400)
RBC: 3.92 MIL/uL (ref 3.87–5.11)
RDW: 14.3 % (ref 11.5–15.5)
WBC: 8.5 10*3/uL (ref 4.0–10.5)
nRBC: 0 % (ref 0.0–0.2)

## 2020-07-24 LAB — COMPREHENSIVE METABOLIC PANEL
ALT: 21 U/L (ref 0–44)
AST: 33 U/L (ref 15–41)
Albumin: 3.8 g/dL (ref 3.5–5.0)
Alkaline Phosphatase: 42 U/L (ref 38–126)
Anion gap: 9 (ref 5–15)
BUN: 20 mg/dL (ref 8–23)
CO2: 21 mmol/L — ABNORMAL LOW (ref 22–32)
Calcium: 8.6 mg/dL — ABNORMAL LOW (ref 8.9–10.3)
Chloride: 104 mmol/L (ref 98–111)
Creatinine, Ser: 0.92 mg/dL (ref 0.44–1.00)
GFR, Estimated: 60 mL/min — ABNORMAL LOW (ref >60)
Glucose, Bld: 124 mg/dL — ABNORMAL HIGH (ref 70–99)
Potassium: 4 mmol/L (ref 3.5–5.1)
Sodium: 134 mmol/L — ABNORMAL LOW (ref 135–145)
Total Bilirubin: 1 mg/dL (ref 0.3–1.2)
Total Protein: 7.3 g/dL (ref 6.5–8.1)

## 2020-07-24 LAB — URINALYSIS, MICROSCOPIC (REFLEX)

## 2020-07-24 LAB — URINALYSIS, ROUTINE W REFLEX MICROSCOPIC
Bilirubin Urine: NEGATIVE
Glucose, UA: NEGATIVE mg/dL
Hgb urine dipstick: NEGATIVE
Ketones, ur: NEGATIVE mg/dL
Nitrite: NEGATIVE
Protein, ur: NEGATIVE mg/dL
Specific Gravity, Urine: <1.005 — ABNORMAL LOW (ref 1.005–1.030)
pH: 6 (ref 5.0–8.0)

## 2020-07-24 LAB — TROPONIN I (HIGH SENSITIVITY)
Troponin I (High Sensitivity): 8 ng/L (ref <18)
Troponin I (High Sensitivity): 10 ng/L (ref <18)

## 2020-07-24 MED ORDER — LABETALOL HCL 5 MG/ML IV SOLN
10.0000 mg | Freq: Once | INTRAVENOUS | Status: AC
  Administered 2020-07-24: 10 mg via INTRAVENOUS
  Filled 2020-07-24: qty 4

## 2020-07-24 NOTE — ED Notes (Signed)
ED Provider at bedside. 

## 2020-07-24 NOTE — ED Notes (Signed)
Patient transported to CT 

## 2020-07-24 NOTE — ED Provider Notes (Signed)
[facility] EMERGENCY DEPARTMENT Provider Note   CSN: [id number] Arrival date & time: 07/24/20  2595     History Chief Complaint  Patient presents with  . Dizziness    Christine Henderson is a 87 y.o. female.  Patient presents to the emergency department with onset of dizziness around 10:30 PM.  Patient reports that she was watching TV when she became very dizzy.  She felt extremely off balance and had to hold onto things in order to walk.  No associated headache.  Patient did check her blood pressure and it was elevated around 638 systolic.  She has had similar reactions to elevated blood pressures in the past.  She does report that here in the emergency room her symptoms are improved.  Patient has been sick with cold symptoms for 3 days.  She has had cough and congestion.  She has been taking Mucinex and Coricidin HBP for the symptoms.  She is not short of breath.        Past Medical History:  Diagnosis Date  . Abdominal pain, unspecified site 06/26/2008  . ALLERGIC RHINITIS CAUSE UNSPECIFIED 02/02/2008  . Annual physical exam 12/26/2012  . Diarrhea 06/26/2008  . Fibrocystic breast disease   . Headache(784.0) 10/01/2006  . Hereditary and idiopathic peripheral neuropathy 01/10/2016  . HYPERLIPIDEMIA 10/01/2006  . HYPERTENSION 10/06/2007  . IBS (irritable bowel syndrome)   . Insomnia 11/14/2012  . Irritable bowel syndrome 09/10/2006  . Medicare annual wellness visit, subsequent 12/26/2012   Sees Dr Elinor Parkinson of opthamology.  MGM 5/14     . Metatarsal stress fracture 01/20/2016  . MIGRAINE HEADACHE 09/10/2006  . MVP (mitral valve prolapse)   . Onychomycosis 07/09/2014  . OSTEOPENIA 09/10/2006  . Osteopenia 11/12/2012  . Palpitations 09/10/2006  . PHOTOPHOBIA 07/20/2009  . SKIN TAG 11/09/2006  . VAGINITIS, ATROPHIC 10/01/2006  . Vitamin D deficiency 07/14/2016    Patient Active Problem List   Diagnosis Date Noted  . Vitamin D deficiency 07/14/2016  . Hereditary and  idiopathic peripheral neuropathy 01/10/2016  . Leg lesion 07/09/2014  . Paresthesia of foot, bilateral 07/09/2014  . Hyperglycemia 07/09/2014  . Onychomycosis 07/09/2014  . Lumbar back pain with radiculopathy affecting left lower extremity 11/13/2013  . Back pain 06/27/2013  . Preventative health care 12/26/2012  . Insomnia 11/14/2012  . ALLERGIC RHINITIS CAUSE UNSPECIFIED 02/02/2008  . Essential hypertension 10/06/2007  . Hyperlipidemia 10/01/2006  . VAGINITIS, ATROPHIC 10/01/2006  . Migraine headache 09/10/2006  . IRRITABLE BOWEL SYNDROME 09/10/2006  . Disorder of bone and cartilage 09/10/2006    Past Surgical History:  Procedure Laterality Date  . DENTAL SURGERY  09/2014  . ROTATOR CUFF REPAIR Right   . TONSILLECTOMY       OB History    Gravida  4   Para  4   Term  0   Preterm  0   AB  0   Living  0     SAB  0   IAB  0   Ectopic  0   Multiple  0   Live Births              Family History  Problem Relation Age of Onset  . Inflammatory bowel disease Sister   . Alzheimer's disease Sister   . Pancreatic cancer Maternal Grandfather   . Alzheimer's disease Mother   . Hyperlipidemia Son   . Obesity Son   . Hyperlipidemia Son   . Heart disease Paternal Uncle   .  Coronary artery disease Neg Hx     Social History   Tobacco Use  . Smoking status: Never Smoker  . Smokeless tobacco: Never Used  Substance Use Topics  . Alcohol use: No  . Drug use: No    Home Medications Prior to Admission medications   Medication Sig Start Date End Date Taking? Authorizing Provider  Ascorbic Acid (VITAMIN C) 1000 MG tablet Take 1,000 mg by mouth daily.    [provider]  Calcium Carbonate-Vitamin D (CALCIUM PLUS VITAMIN D PO) Take 1,200 tablets by mouth daily.    [provider]  fish oil-omega-3 fatty acids 1000 MG capsule Take 2 g by mouth daily.      [provider]  Glucosamine-Chondroit-Vit C-Mn (GLUCOSAMINE 1500 COMPLEX) CAPS Take  1,500 capsules by mouth 2 (two) times daily.    [provider]  losartan (COZAAR) 25 MG tablet Take 0.5-1 tablets (12.5-25 mg total) by mouth daily. 03/13/20   Mosie Lukes, MD  Multiple Vitamin (MULTIVITAMIN) tablet Take 1 tablet by mouth daily.    [provider]  simvastatin (ZOCOR) 10 MG tablet Take 1 tablet (10 mg total) by mouth at bedtime. 03/13/20   Mosie Lukes, MD    Allergies    Codeine  Review of Systems   Review of Systems  Respiratory: Positive for cough.   Neurological: Positive for dizziness.  All other systems reviewed and are negative.   Physical Exam Updated Vital Signs BP (!) 152/77   Pulse 66   Temp 98.2 F (36.8 C) (Oral)   Resp 18   Ht 5\' 4"  (1.626 m)   Wt 54.4 kg   SpO2 94%   BMI 20.60 kg/m   Physical Exam Vitals and nursing note reviewed.  Constitutional:      General: She is not in acute distress.    Appearance: Normal appearance. She is well-developed.  HENT:     Head: Normocephalic and atraumatic.     Right Ear: Hearing normal.     Left Ear: Hearing normal.     Nose: Nose normal.  Eyes:     Conjunctiva/sclera: Conjunctivae normal.     Pupils: Pupils are equal, round, and reactive to light.  Cardiovascular:     Rate and Rhythm: Regular rhythm.     Heart sounds: S1 normal and S2 normal. No murmur heard. No friction rub. No gallop.   Pulmonary:     Effort: Pulmonary effort is normal. No respiratory distress.     Breath sounds: Normal breath sounds.  Chest:     Chest wall: No tenderness.  Abdominal:     General: Bowel sounds are normal.     Palpations: Abdomen is soft.     Tenderness: There is no abdominal tenderness. There is no guarding or rebound. Negative signs include Murphy's sign and McBurney's sign.     Hernia: No hernia is present.  Musculoskeletal:        General: Normal range of motion.     Cervical back: Normal range of motion and neck supple.  Skin:    General: Skin is warm and dry.     Findings:  No rash.  Neurological:     Mental Status: She is alert and oriented to person, place, and time.     GCS: GCS eye subscore is 4. GCS verbal subscore is 5. GCS motor subscore is 6.     Cranial Nerves: No cranial nerve deficit.     Sensory: No sensory deficit.  Coordination: Coordination normal.  Psychiatric:        Speech: Speech normal.        Behavior: Behavior normal.        Thought Content: Thought content normal.     ED Results / Procedures / Treatments   Labs (all labs ordered are listed, but only abnormal results are displayed) Labs Reviewed  COMPREHENSIVE METABOLIC PANEL - Abnormal; Notable for the following components:      Result Value   Sodium 134 (*)    CO2 21 (*)    Glucose, Bld 124 (*)    Calcium 8.6 (*)    GFR, Estimated 60 (*)    All other components within normal limits  URINALYSIS, ROUTINE W REFLEX MICROSCOPIC - Abnormal; Notable for the following components:   Specific Gravity, Urine <1.005 (*)    Leukocytes,Ua SMALL (*)    All other components within normal limits  URINALYSIS, MICROSCOPIC (REFLEX) - Abnormal; Notable for the following components:   Bacteria, UA RARE (*)    All other components within normal limits  RESP PANEL BY RT-PCR (FLU A&B, COVID) [id number]  CBC WITH DIFFERENTIAL/PLATELET  TROPONIN I (HIGH SENSITIVITY)  TROPONIN I (HIGH SENSITIVITY)    EKG EKG Interpretation  Date/Time:  Tuesday Jul 24 2020 02:03:06 EDT Ventricular Rate:  79 PR Interval:  256 QRS Duration: 89 QT Interval:  386 QTC Calculation: 443 R Axis:   -42 Text Interpretation: Sinus rhythm Prolonged PR interval Left axis deviation No significant change since last tracing Confirmed by Orpah Greek [id number]) on 07/24/2020 2:06:53 AM   Radiology CT HEAD WO CONTRAST  Result Date: 07/24/2020 CLINICAL DATA:  Dizziness EXAM: CT HEAD WITHOUT CONTRAST TECHNIQUE: Contiguous axial images were obtained from the base of the skull through the vertex without intravenous  contrast. COMPARISON:  06/07/2018 FINDINGS: Brain: Normal anatomic configuration. Parenchymal volume loss is commensurate with the patient's age. Moderate periventricular white matter changes are present likely reflecting the sequela of small vessel ischemia. No abnormal intra or extra-axial mass lesion or fluid collection. No abnormal mass effect or midline shift. No evidence of acute intracranial hemorrhage or infarct. Ventricular size is normal. Cerebellum unremarkable. Vascular: No asymmetric hyperdense vasculature at the skull base. Skull: Intact Sinuses/Orbits: Paranasal sinuses are clear. Orbits are unremarkable. Other: Mastoid air cells and middle ear cavities are clear. IMPRESSION: No acute intracranial abnormality. Moderate senescent change, progressive since prior examination. Electronically Signed   By: Fidela Salisbury MD   On: 07/24/2020 02:49   DG Chest Port 1 View  Result Date: 07/24/2020 CLINICAL DATA:  Cough EXAM: PORTABLE CHEST 1 VIEW COMPARISON:  05/31/2018 FINDINGS: Lungs are clear. No pneumothorax or pleural effusion. Mild cardiomegaly is stable. Pulmonary vascularity is normal. No acute bone abnormality. Thoracolumbar scoliosis again noted. IMPRESSION: No active disease.  Stable cardiomegaly. Electronically Signed   By: Fidela Salisbury MD   On: 07/24/2020 02:46    Procedures Procedures   Medications Ordered in ED Medications  labetalol (NORMODYNE) injection 10 mg (10 mg Intravenous Given 07/24/20 0336)    ED Course  I have reviewed the triage vital signs and the nursing notes.  Pertinent labs & imaging results that were available during my care of the patient were reviewed by me and considered in my medical decision making (see chart for details).    MDM Rules/Calculators/A&P                          Patient with complaints of  dizziness and elevated blood pressure.  Symptoms began around 10:30 PM.  Dizziness is improved at arrival, as is her blood pressure.  Patient has  done well here in the emergency department.  She is ambulatory without difficulty.  She has a normal neurologic exam.  Work-up has been very reassuring.  She has had some labile blood pressures.  This was treated with a small amount of labetalol.  Pressure is improved.  Patient is felt to be safe for discharge, follow-up for repeat blood pressure check at primary care. Final Clinical Impression(s) / ED Diagnoses Final diagnoses:  Dizziness  Primary hypertension    Rx / DC Orders ED Discharge Orders    None       Olusegun Gerstenberger, Gwenyth Allegra, MD 07/24/20 [phone]

## 2020-07-24 NOTE — ED Triage Notes (Addendum)
Pt presents to ED POV. Pt c/o constant dizziness since 2300. Pt reports HTN at home. Took home bp meds and it has come down since. Dizziness has slightly subsided but is still there. Pt AAO x4. Also reports rhinorrhea and cough since fri

## 2020-07-27 ENCOUNTER — Telehealth: Payer: Self-pay

## 2020-07-27 NOTE — Telephone Encounter (Signed)
Pt is requesting to transfer FROM: Dr. Charlett Blake Pt is requesting to transfer TO: Dr. Raoul Pitch Reason for requested transfer: Location - patient son lives in [place] - will be staying with him if needed Best contact number: [phone]   Dr. Raoul Pitch gave verbal okay before she left office yesterday.    Thank you, Hinton Dyer

## 2020-07-29 ENCOUNTER — Other Ambulatory Visit: Payer: Self-pay

## 2020-07-29 ENCOUNTER — Emergency Department (HOSPITAL_BASED_OUTPATIENT_CLINIC_OR_DEPARTMENT_OTHER): Payer: Medicare Other

## 2020-07-29 ENCOUNTER — Emergency Department (HOSPITAL_BASED_OUTPATIENT_CLINIC_OR_DEPARTMENT_OTHER)
Admission: EM | Admit: 2020-07-29 | Discharge: 2020-07-29 | Disposition: A | Discharge status: Home / Self Care | Payer: Medicare Other | Attending: Emergency Medicine | Admitting: Emergency Medicine

## 2020-07-29 ENCOUNTER — Encounter (HOSPITAL_BASED_OUTPATIENT_CLINIC_OR_DEPARTMENT_OTHER): Payer: Self-pay

## 2020-07-29 DIAGNOSIS — J189 Pneumonia, unspecified organism: Secondary | ICD-10-CM | POA: Insufficient documentation

## 2020-07-29 DIAGNOSIS — Z79899 Other long term (current) drug therapy: Secondary | ICD-10-CM | POA: Insufficient documentation

## 2020-07-29 DIAGNOSIS — I10 Essential (primary) hypertension: Secondary | ICD-10-CM | POA: Diagnosis not present

## 2020-07-29 DIAGNOSIS — R21 Rash and other nonspecific skin eruption: Secondary | ICD-10-CM | POA: Diagnosis not present

## 2020-07-29 DIAGNOSIS — R059 Cough, unspecified: Secondary | ICD-10-CM | POA: Diagnosis not present

## 2020-07-29 MED ORDER — DOXYCYCLINE HYCLATE 100 MG PO CAPS
100.0000 mg | ORAL_CAPSULE | Freq: Two times a day (BID) | ORAL | 0 refills | Status: DC
  Filled 2020-07-29: qty 20, 10d supply, fill #0

## 2020-07-29 MED ORDER — DOXYCYCLINE HYCLATE 100 MG PO TABS
100.0000 mg | ORAL_TABLET | Freq: Once | ORAL | Status: AC
  Administered 2020-07-29: 100 mg via ORAL
  Filled 2020-07-29: qty 1

## 2020-07-29 MED ORDER — DOXYCYCLINE HYCLATE 100 MG PO CAPS: 100.0000 mg | ORAL_CAPSULE | Freq: Two times a day (BID) | ORAL | 0 refills | Status: AC

## 2020-07-29 MED ORDER — DOXYCYCLINE HYCLATE 100 MG PO CAPS: 100.0000 mg | ORAL_CAPSULE | Freq: Two times a day (BID) | ORAL | 0 refills | Status: DC

## 2020-07-29 NOTE — ED Notes (Signed)
ED Provider at bedside. 

## 2020-07-29 NOTE — ED Provider Notes (Signed)
[facility] EMERGENCY DEPARTMENT Provider Note   CSN: [id number] Arrival date & time: 07/29/20  1519     History Chief Complaint  Patient presents with  . Cough    Christine Henderson is a 87 y.o. female.  The history is provided by the patient.  Cough Cough characteristics:  Non-productive Sputum characteristics:  Nondescript Severity:  Mild Onset quality:  Gradual Timing:  Constant Progression:  Unchanged Chronicity:  New Context: upper respiratory infection   Relieved by:  Nothing Worsened by:  Nothing Associated symptoms: no chest pain, no chills, no diaphoresis, no ear fullness, no ear pain, no eye discharge, no fever, no headaches, no myalgias, no rash, no rhinorrhea, no shortness of breath, no sinus congestion, no sore throat, no weight loss and no wheezing        Past Medical History:  Diagnosis Date  . Abdominal pain, unspecified site 06/26/2008  . ALLERGIC RHINITIS CAUSE UNSPECIFIED 02/02/2008  . Annual physical exam 12/26/2012  . Diarrhea 06/26/2008  . Fibrocystic breast disease   . Headache(784.0) 10/01/2006  . Hereditary and idiopathic peripheral neuropathy 01/10/2016  . HYPERLIPIDEMIA 10/01/2006  . HYPERTENSION 10/06/2007  . IBS (irritable bowel syndrome)   . Insomnia 11/14/2012  . Irritable bowel syndrome 09/10/2006  . Medicare annual wellness visit, subsequent 12/26/2012   Sees Dr Elinor Parkinson of opthamology.  MGM 5/14     . Metatarsal stress fracture 01/20/2016  . MIGRAINE HEADACHE 09/10/2006  . MVP (mitral valve prolapse)   . Onychomycosis 07/09/2014  . OSTEOPENIA 09/10/2006  . Osteopenia 11/12/2012  . Palpitations 09/10/2006  . PHOTOPHOBIA 07/20/2009  . SKIN TAG 11/09/2006  . VAGINITIS, ATROPHIC 10/01/2006  . Vitamin D deficiency 07/14/2016    Patient Active Problem List   Diagnosis Date Noted  . Vitamin D deficiency 07/14/2016  . Hereditary and idiopathic peripheral neuropathy 01/10/2016  . Leg lesion 07/09/2014  . Paresthesia of foot,  bilateral 07/09/2014  . Hyperglycemia 07/09/2014  . Onychomycosis 07/09/2014  . Lumbar back pain with radiculopathy affecting left lower extremity 11/13/2013  . Back pain 06/27/2013  . Preventative health care 12/26/2012  . Insomnia 11/14/2012  . ALLERGIC RHINITIS CAUSE UNSPECIFIED 02/02/2008  . Essential hypertension 10/06/2007  . Hyperlipidemia 10/01/2006  . VAGINITIS, ATROPHIC 10/01/2006  . Migraine headache 09/10/2006  . IRRITABLE BOWEL SYNDROME 09/10/2006  . Disorder of bone and cartilage 09/10/2006    Past Surgical History:  Procedure Laterality Date  . DENTAL SURGERY  09/2014  . ROTATOR CUFF REPAIR Right   . TONSILLECTOMY       OB History    Gravida  4   Para  4   Term  0   Preterm  0   AB  0   Living  0     SAB  0   IAB  0   Ectopic  0   Multiple  0   Live Births              Family History  Problem Relation Age of Onset  . Inflammatory bowel disease Sister   . Alzheimer's disease Sister   . Pancreatic cancer Maternal Grandfather   . Alzheimer's disease Mother   . Hyperlipidemia Son   . Obesity Son   . Hyperlipidemia Son   . Heart disease Paternal Uncle   . Coronary artery disease Neg Hx     Social History   Tobacco Use  . Smoking status: Never Smoker  . Smokeless tobacco: Never Used  Substance Use Topics  .  Alcohol use: No  . Drug use: No    Home Medications Prior to Admission medications   Medication Sig Start Date End Date Taking? Authorizing Provider  Ascorbic Acid (VITAMIN C) 1000 MG tablet Take 1,000 mg by mouth daily.    [provider]  Calcium Carbonate-Vitamin D (CALCIUM PLUS VITAMIN D PO) Take 1,200 tablets by mouth daily.    [provider]  doxycycline (VIBRAMYCIN) 100 MG capsule Take 1 capsule (100 mg total) by mouth 2 (two) times daily for 10 days. 07/29/20 08/08/20  Courtlynn Holloman, DO  fish oil-omega-3 fatty acids 1000 MG capsule Take 2 g by mouth daily.      [provider]   Glucosamine-Chondroit-Vit C-Mn (GLUCOSAMINE 1500 COMPLEX) CAPS Take 1,500 capsules by mouth 2 (two) times daily.    [provider]  losartan (COZAAR) 25 MG tablet Take 0.5-1 tablets (12.5-25 mg total) by mouth daily. 03/13/20   Mosie Lukes, MD  Multiple Vitamin (MULTIVITAMIN) tablet Take 1 tablet by mouth daily.    [provider]  simvastatin (ZOCOR) 10 MG tablet Take 1 tablet (10 mg total) by mouth at bedtime. 03/13/20   Mosie Lukes, MD    Allergies    Codeine  Review of Systems   Review of Systems  Constitutional: Negative for chills, diaphoresis, fever and weight loss.  HENT: Negative for ear pain, rhinorrhea and sore throat.   Eyes: Negative for pain, discharge and visual disturbance.  Respiratory: Positive for cough. Negative for shortness of breath and wheezing.   Cardiovascular: Negative for chest pain and palpitations.  Gastrointestinal: Negative for abdominal pain and vomiting.  Genitourinary: Negative for dysuria and hematuria.  Musculoskeletal: Negative for arthralgias, back pain and myalgias.  Skin: Negative for color change and rash.  Neurological: Negative for seizures, syncope and headaches.  All other systems reviewed and are negative.   Physical Exam Updated Vital Signs  ED Triage Vitals  Enc Vitals Group     BP 07/29/20 1528 (!) 166/98     Pulse Rate 07/29/20 1528 89     Resp 07/29/20 1528 18     Temp 07/29/20 1528 98.5 F (36.9 C)     Temp Source 07/29/20 1528 Oral     SpO2 07/29/20 1528 95 %     Weight 07/29/20 1528 120 lb (54.4 kg)     Height 07/29/20 1528 5\' 4"  (1.626 m)     Head Circumference --      Peak Flow --      Pain Score 07/29/20 1527 0     Pain Loc --      Pain Edu? --      Excl. in [place]? --      Physical Exam Vitals and nursing note reviewed.  Constitutional:      General: She is not in acute distress.    Appearance: She is well-developed. She is not ill-appearing.  HENT:     Head: Normocephalic and  atraumatic.     Nose: Nose normal.     Mouth/Throat:     Mouth: Mucous membranes are moist.  Eyes:     Extraocular Movements: Extraocular movements intact.     Conjunctiva/sclera: Conjunctivae normal.     Pupils: Pupils are equal, round, and reactive to light.  Cardiovascular:     Rate and Rhythm: Normal rate and regular rhythm.     Pulses: Normal pulses.     Heart sounds: Normal heart sounds. No murmur heard.   Pulmonary:  Effort: Pulmonary effort is normal. No respiratory distress.     Breath sounds: Normal breath sounds.  Abdominal:     Palpations: Abdomen is soft.     Tenderness: There is no abdominal tenderness.  Musculoskeletal:     Cervical back: Normal range of motion and neck supple.  Skin:    General: Skin is warm and dry.     Capillary Refill: Capillary refill takes less than 2 seconds.  Neurological:     General: No focal deficit present.     Mental Status: She is alert.  Psychiatric:        Mood and Affect: Mood normal.     ED Results / Procedures / Treatments   Labs (all labs ordered are listed, but only abnormal results are displayed) Labs Reviewed - No data to display  EKG None  Radiology DG Chest 2 View  Result Date: 07/29/2020 CLINICAL DATA:  87 year old female with cough. EXAM: CHEST - 2 VIEW COMPARISON:  Chest radiograph dated 07/24/2020. FINDINGS: Linear and streaky densities at the left lung base may represent atelectasis/scarring or infiltrate. Additionally there is a faint focal rounded density in the right mid lung field, new since the prior radiograph which may represent developing infiltrate. Clinical correlation is recommended. No pleural effusion or pneumothorax. Stable cardiac silhouette. No acute osseous pathology. Degenerative changes of the spine and scoliosis. IMPRESSION: 1. Left lung base atelectasis/scarring or infiltrate. 2. Faint focal rounded density in the right mid lung field may represent developing infiltrate. Electronically  Signed   By: Anner Crete M.D.   On: 07/29/2020 16:27    Procedures Procedures   Medications Ordered in ED Medications  doxycycline (VIBRA-TABS) tablet 100 mg (100 mg Oral Given 07/29/20 1659)    ED Course  I have reviewed the triage vital signs and the nursing notes.  Pertinent labs & imaging results that were available during my care of the patient were reviewed by me and considered in my medical decision making (see chart for details).    MDM Rules/Calculators/A&P                          Tiawana Forgy Belitz is here with cough.  History of high cholesterol, high blood pressure.  Nondescript cough for the last several days.  Normal vitals.  No fever.  No respiratory distress.  Had a negative COVID test 5 days ago.  Chest x-ray here concerning for possible pneumonia.  Will treat with doxycycline.  No concern for sepsis.  Overall she appears well.  She understands return precautions and was discharged in the ED in good condition.  This chart was dictated using voice recognition software.  Despite best efforts to proofread,  errors can occur which can change the documentation meaning.    Final Clinical Impression(s) / ED Diagnoses Final diagnoses:  Community acquired pneumonia, unspecified laterality    Rx / DC Orders ED Discharge Orders         Ordered    doxycycline (VIBRAMYCIN) 100 MG capsule  2 times daily,   Status:  Discontinued        07/29/20 1644    doxycycline (VIBRAMYCIN) 100 MG capsule  2 times daily        07/29/20 [place], [place], DO 07/29/20 1713

## 2020-07-29 NOTE — Discharge Instructions (Signed)
Follow-up with your primary care doctor in about a week for reevaluation.  Please return if you develop fever, fatigue, other concerning symptoms as discussed.

## 2020-07-29 NOTE — ED Notes (Signed)
ED Provider at bedside. Dr. Curatolo 

## 2020-07-29 NOTE — ED Triage Notes (Signed)
Pt arrives c/o hypertension, cough, congestion x 1 week. Two episodes of diarrhea today. Seen here for same on 5/24, negative for COVID. Hasnt followed up with PCP to adjust meds.

## 2020-07-30 NOTE — Telephone Encounter (Signed)
OK with me for transfer.  

## 2020-07-31 ENCOUNTER — Telehealth: Payer: Self-pay

## 2020-07-31 ENCOUNTER — Other Ambulatory Visit (HOSPITAL_BASED_OUTPATIENT_CLINIC_OR_DEPARTMENT_OTHER): Payer: Self-pay

## 2020-07-31 NOTE — Telephone Encounter (Signed)
If they feel they need me I could see her in person at 3 today or do a VV tomorrow morning with her.

## 2020-07-31 NOTE — Telephone Encounter (Signed)
Appointment scheduled for Thursday

## 2020-07-31 NOTE — Telephone Encounter (Addendum)
Patient / Member / Family / Caregiver Call Type Triage / Clinical Caller Name Lindie Roberson Relationship To Patient Son Return Phone Number [phone] (Secondary) Chief Complaint Blood Pressure High Reason for Call Symptomatic / Request for [place] states that his mother was at the med center for 4 hours. Her latest BP was 174/95, she has a cold with cough and no energy. Negative for flu and covid Translation No Disp. Time Eilene Ghazi Time) Disposition Final User 07/28/2020 2:54:15 PM Attempt made - message left Newhart, RN, Beth 07/28/2020 3:13:30 PM Attempt made - message left Newhart, RN, Eustaquio Maize 07/28/2020 3:14:47 PM FINAL ATTEMPT MADE - message left Yes Newhart, RN, Beth  Another call that Day  Caller missed the nurse's call and would like to speak to nurse about his mother's BP which is low now - 92/67. The pt is tired and has a cold with cough. Pt is neg for Covid19 and Flu

## 2020-07-31 NOTE — Telephone Encounter (Signed)
Patient was in ER on 07/29/20.

## 2020-08-01 NOTE — Telephone Encounter (Signed)
Please schedule patient at her earliest convenience for transfer of care and I am assuming emergency room follow-up for her pneumonia/dizziness.   Dana-this does not need to count and my "new patient" slots.  Thanks.

## 2020-08-02 ENCOUNTER — Ambulatory Visit (INDEPENDENT_AMBULATORY_CARE_PROVIDER_SITE_OTHER): Payer: Medicare Other | Admitting: Family Medicine

## 2020-08-02 ENCOUNTER — Ambulatory Visit (HOSPITAL_BASED_OUTPATIENT_CLINIC_OR_DEPARTMENT_OTHER)
Admission: RE | Admit: 2020-08-02 | Discharge: 2020-08-02 | Disposition: A | Discharge status: Home / Self Care | Payer: Medicare Other | Source: Ambulatory Visit | Attending: Family Medicine | Admitting: Family Medicine

## 2020-08-02 ENCOUNTER — Other Ambulatory Visit: Payer: Self-pay

## 2020-08-02 VITALS — BP 120/78 | HR 79 | Temp 98.0°F | Resp 12 | Ht 63.0 in | Wt 120.0 lb

## 2020-08-02 DIAGNOSIS — R739 Hyperglycemia, unspecified: Secondary | ICD-10-CM | POA: Diagnosis not present

## 2020-08-02 DIAGNOSIS — E559 Vitamin D deficiency, unspecified: Secondary | ICD-10-CM | POA: Diagnosis not present

## 2020-08-02 DIAGNOSIS — R059 Cough, unspecified: Secondary | ICD-10-CM | POA: Diagnosis not present

## 2020-08-02 DIAGNOSIS — E782 Mixed hyperlipidemia: Secondary | ICD-10-CM

## 2020-08-02 DIAGNOSIS — R918 Other nonspecific abnormal finding of lung field: Secondary | ICD-10-CM

## 2020-08-02 DIAGNOSIS — I517 Cardiomegaly: Secondary | ICD-10-CM | POA: Diagnosis not present

## 2020-08-02 DIAGNOSIS — R0989 Other specified symptoms and signs involving the circulatory and respiratory systems: Secondary | ICD-10-CM | POA: Diagnosis not present

## 2020-08-02 DIAGNOSIS — I10 Essential (primary) hypertension: Secondary | ICD-10-CM | POA: Diagnosis not present

## 2020-08-02 DIAGNOSIS — J189 Pneumonia, unspecified organism: Secondary | ICD-10-CM

## 2020-08-02 MED ORDER — ALBUTEROL SULFATE HFA 108 (90 BASE) MCG/ACT IN AERS: 2.0000 | INHALATION_SPRAY | Freq: Four times a day (QID) | RESPIRATORY_TRACT | 0 refills | Status: DC | PRN

## 2020-08-02 NOTE — Assessment & Plan Note (Signed)
She reports her blood pressure is labile with highs and lows since she became ill with pneumonia. She is encouraged to increase her water and protein intake and if her blood pressure is low can hold blood pressure meds

## 2020-08-02 NOTE — Patient Instructions (Addendum)
Increase protein in diet eat some form of protein every 4-5 hours and increase fluid intake to 60-80 ounces daily   Community-Acquired Pneumonia, Adult Pneumonia is a lung infection that causes inflammation and the buildup of mucus and fluids in the lungs. This may cause coughing and difficulty breathing. Community-acquired pneumonia is pneumonia that develops in people who are not, and have not recently been, in a hospital or other health care facility. Usually, pneumonia develops as a result of an illness that is caused by a virus, such as the common cold and the flu (influenza). It can also be caused by bacteria or fungi. While the common cold and influenza can pass from person to person (are contagious), pneumonia itself is not considered contagious. What are the causes? This condition may be caused by:  Viruses.  Bacteria.  Fungi, such as molds or mushrooms.   What increases the risk? The following factors may make you more likely to develop this condition:  Having certain medical conditions, such as: ? A long-term (chronic) disease, which may include chronic obstructive pulmonary disease (COPD), asthma, heart failure, cystic fibrosis, diabetes, kidney disease, sickle cell disease, and human immunodeficiency virus (HIV). ? A condition that increases the risk of breathing in (aspirating) mucus and other fluids from your mouth and nose. ? A weakened body defense system (immune system).  Having had your spleen removed (splenectomy). The spleen is the organ that helps fight germs and infections.  Not cleaning your teeth and gums well (poor dental hygiene).  Using tobacco products.  Traveling to places where germs that cause pneumonia are present.  Being near certain animals, or animal habitats, that have germs that cause pneumonia.  Being older than 85 years of age. What are the signs or symptoms? Symptoms of this condition include:  A dry cough or a wet (productive) cough.  A  fever.  Sweating or chills.  Chest pain, especially when breathing deeply or coughing.  Fast breathing, difficulty breathing, or shortness of breath.  Tiredness (fatigue).  Muscle aches. How is this diagnosed? This condition may be diagnosed based on your medical history or a physical exam. You may also have tests, including:  Chest X-rays.  Tests of the level of oxygen and other gases in your blood.  Tests of: ? Your blood. ? Mucus from your lungs (sputum). ? Fluid around your lungs (pleural fluid). ? Your urine. If your pneumonia is severe, other tests may be done to learn more about the cause.   How is this treated? Treatment for this condition depends on many factors, such as the cause of your pneumonia, your medicines, and other medical conditions that you have. For most adults, pneumonia may be treated at home. In some cases, treatment must happen in a hospital and may include:  Medicines that are given by mouth (orally) or through an IV, including: ? Antibiotic medicines, if bacteria caused the pneumonia. ? Medicines that kill viruses (antiviral medicines), if a virus caused the pneumonia.  Oxygen therapy. Severe pneumonia, although rare, may require the following treatments:  Mechanical ventilation.This procedure uses a machine to help you breathe if you cannot breathe well on your own or maintain a safe level of blood oxygen.  Thoracentesis. This procedure removes any buildup of pleural fluid to help with breathing. Follow these instructions at home: Medicines  Take over-the-counter and prescription medicines only as told by your health care provider.  Take cough medicine only if you have trouble sleeping. Cough medicine can prevent your body  from removing mucus from your lungs.  If you were prescribed an antibiotic medicine, take it as told by your health care provider. Do not stop taking the antibiotic even if you start to feel better. Lifestyle  Do not  drink alcohol.  Do not use any products that contain nicotine or tobacco, such as cigarettes, e-cigarettes, and chewing tobacco. If you need help quitting, ask your health care provider.  Eat a healthy diet. This includes plenty of vegetables, fruits, whole grains, low-fat dairy products, and lean protein.      General instructions  Rest a lot and get at least 8 hours of sleep each night.  Sleep in a partly upright position at night. Place a few pillows under your head or sleep in a reclining chair.  Return to your normal activities as told by your health care provider. Ask your health care provider what activities are safe for you.  Drink enough fluid to keep your urine pale yellow. This helps to thin the mucus in your lungs.  If your throat is sore, gargle with a salt-water mixture 3-4 times a day or as needed. To make a salt-water mixture, completely dissolve -1 tsp (3-6 g) of salt in 1 cup (237 mL) of warm water.  Keep all follow-up visits as told by your health care provider. This is important.   How is this prevented? You can lower your risk of developing community-acquired pneumonia by:  Getting the pneumonia vaccine. There are different types and schedules of pneumonia vaccines. Ask your health care provider which option is best for you. Consider getting the pneumonia vaccine if: ? You are older than 85 years of age. ? You are 6-62 years of age and are receiving cancer treatment, have chronic lung disease, or have other medical conditions that affect your immune system. Ask your health care provider if this applies to you.  Getting your influenza vaccine every year. Ask your health care provider which type of vaccine is best for you.  Getting regular dental checkups.  Washing your hands often with soap and water for at least 20 seconds. If soap and water are not available, use hand sanitizer. Contact a health care provider if you have:  A fever.  Trouble sleeping because  you cannot control your cough with cough medicine. Get help right away if:  Your shortness of breath becomes worse.  Your chest pain increases.  Your sickness becomes worse, especially if you are an older adult or have a weak immune system.  You cough up blood. These symptoms may represent a serious problem that is an emergency. Do not wait to see if the symptoms will go away. Get medical help right away. Call your local emergency services (911 in the [place] Do not drive yourself to the hospital. Summary  Pneumonia is an infection of the lungs.  Community-acquired pneumonia develops in people who have not been in the hospital. It can be caused by bacteria, viruses, or fungi.  This condition may be treated with antibiotics or antiviral medicines.  Severe pneumonia may require a hospital stay and treatment to help with breathing. This information is not intended to replace advice given to you by your health care provider. Make sure you discuss any questions you have with your health care provider. Document Revised: 11/30/2018 Document Reviewed: 11/30/2018 Elsevier Patient Education  [place].

## 2020-08-02 NOTE — Progress Notes (Signed)
Patient ID: Christine Henderson, female    DOB: November 24, 1930  Age: 87 y.o. MRN: [id number]    Subjective:  Subjective  HPI Christine Henderson presents for office visit today for follow up on pneumonia and recent ER visit. She reports that she is feeling better when compared to last week and states that she has been getting better since her ER visit. She states that her BP was high during her ER visit. She denies any chest pain, fatigue, SOB, fever, abdominal pain, chills, sore throat, appetite changes, dysuria, urinary incontinence, back pain, HA, or N/VD. She reports that when she was sick a week ago and felt lightheaded. She states that her BP at the time was 166/95 which she states was her highest.   Review of Systems  Constitutional: Negative for appetite change, chills, fatigue and fever.  HENT: Negative for congestion, rhinorrhea, sinus pressure, sinus pain and sore throat.   Eyes: Negative for pain.  Respiratory: Positive for cough. Negative for shortness of breath.   Cardiovascular: Negative for chest pain, palpitations and leg swelling.  Gastrointestinal: Negative for abdominal pain, blood in stool, diarrhea, nausea and vomiting.  Genitourinary: Negative for decreased urine volume, flank pain, frequency, vaginal bleeding and vaginal discharge.  Musculoskeletal: Negative for back pain.  Neurological: Negative for headaches.    History Past Medical History:  Diagnosis Date  . Abdominal pain, unspecified site 06/26/2008  . ALLERGIC RHINITIS CAUSE UNSPECIFIED 02/02/2008  . Annual physical exam 12/26/2012  . Diarrhea 06/26/2008  . Fibrocystic breast disease   . Headache(784.0) 10/01/2006  . Hereditary and idiopathic peripheral neuropathy 01/10/2016  . HYPERLIPIDEMIA 10/01/2006  . HYPERTENSION 10/06/2007  . IBS (irritable bowel syndrome)   . Insomnia 11/14/2012  . Irritable bowel syndrome 09/10/2006  . Medicare annual wellness visit, subsequent 12/26/2012   Sees Dr Elinor Parkinson of  opthamology.  MGM 5/14     . Metatarsal stress fracture 01/20/2016  . MIGRAINE HEADACHE 09/10/2006  . MVP (mitral valve prolapse)   . Onychomycosis 07/09/2014  . OSTEOPENIA 09/10/2006  . Osteopenia 11/12/2012  . Palpitations 09/10/2006  . PHOTOPHOBIA 07/20/2009  . SKIN TAG 11/09/2006  . VAGINITIS, ATROPHIC 10/01/2006  . Vitamin D deficiency 07/14/2016    She has a past surgical history that includes Rotator cuff repair (Right); Tonsillectomy; and Dental surgery (09/2014).   Her family history includes Alzheimer's disease in her mother and sister; Heart disease in her paternal uncle; Hyperlipidemia in her son and son; Inflammatory bowel disease in her sister; Obesity in her son; Pancreatic cancer in her maternal grandfather.She reports that she has never smoked. She has never used smokeless tobacco. She reports that she does not drink alcohol and does not use drugs.  Current Outpatient Medications on File Prior to Visit  Medication Sig Dispense Refill  . Ascorbic Acid (VITAMIN C) 1000 MG tablet Take 1,000 mg by mouth daily.    . Calcium Carbonate-Vitamin D (CALCIUM PLUS VITAMIN D PO) Take 1,200 tablets by mouth daily.    [name] Kitchen doxycycline (VIBRAMYCIN) 100 MG capsule Take 1 capsule (100 mg total) by mouth 2 (two) times daily for 10 days. 20 capsule 0  . fish oil-omega-3 fatty acids 1000 MG capsule Take 2 g by mouth daily.    . Glucosamine-Chondroit-Vit C-Mn (GLUCOSAMINE 1500 COMPLEX) CAPS Take 1,500 capsules by mouth 2 (two) times daily.    [name] Kitchen losartan (COZAAR) 25 MG tablet Take 0.5-1 tablets (12.5-25 mg total) by mouth daily. 90 tablet 1  . Multiple Vitamin (MULTIVITAMIN) tablet Take 1  tablet by mouth daily.    . simvastatin (ZOCOR) 10 MG tablet Take 1 tablet (10 mg total) by mouth at bedtime. 90 tablet 1   No current facility-administered medications on file prior to visit.     Objective:  Objective  Physical Exam Constitutional:      General: She is not in acute distress.    Appearance: Normal  appearance. She is not ill-appearing or toxic-appearing.  HENT:     Head: Normocephalic and atraumatic.     Right Ear: Tympanic membrane, ear canal and external ear normal.     Left Ear: Tympanic membrane, ear canal and external ear normal.     Nose: No congestion or rhinorrhea.  Eyes:     Extraocular Movements: Extraocular movements intact.     Pupils: Pupils are equal, round, and reactive to light.  Cardiovascular:     Rate and Rhythm: Normal rate and regular rhythm.     Pulses: Normal pulses.     Heart sounds: Normal heart sounds. No murmur heard.   Pulmonary:     Effort: Pulmonary effort is normal. No respiratory distress.     Breath sounds: Rhonchi (right side) present. No wheezing or rales.  Abdominal:     General: Bowel sounds are normal.     Palpations: Abdomen is soft. There is no mass.     Tenderness: There is no abdominal tenderness. There is no guarding.     Hernia: No hernia is present.  Musculoskeletal:        General: Normal range of motion.     Cervical back: Normal range of motion and neck supple.  Skin:    General: Skin is warm and dry.  Neurological:     Mental Status: She is alert and oriented to person, place, and time.  Psychiatric:        Behavior: Behavior normal.    BP 120/78 (BP Location: Right Arm, Cuff Size: Normal)   Pulse 79   Temp 98 F (36.7 C) (Oral)   Resp 12   Ht 5\' 3"  (1.6 m)   Wt 120 lb (54.4 kg)   SpO2 90%   BMI 21.26 kg/m  Wt Readings from Last 3 Encounters:  08/02/20 120 lb (54.4 kg)  07/29/20 120 lb (54.4 kg)  07/24/20 120 lb (54.4 kg)     Lab Results  Component Value Date   WBC 8.5 07/24/2020   HGB 12.6 07/24/2020   HCT 37.5 07/24/2020   PLT 233 07/24/2020   GLUCOSE 124 (H) 07/24/2020   CHOL 157 05/08/2020   TRIG 78.0 05/08/2020   HDL 68.40 05/08/2020   LDLCALC 73 05/08/2020   ALT 21 07/24/2020   AST 33 07/24/2020   NA 134 (L) 07/24/2020   K 4.0 07/24/2020   CL 104 07/24/2020   CREATININE 0.92 07/24/2020    BUN 20 07/24/2020   CO2 21 (L) 07/24/2020   TSH 3.02 05/08/2020   HGBA1C 6.0 05/08/2020    DG Chest 2 View  Result Date: 07/29/2020 CLINICAL DATA:  87 year old female with cough. EXAM: CHEST - 2 VIEW COMPARISON:  Chest radiograph dated 07/24/2020. FINDINGS: Linear and streaky densities at the left lung base may represent atelectasis/scarring or infiltrate. Additionally there is a faint focal rounded density in the right mid lung field, new since the prior radiograph which may represent developing infiltrate. Clinical correlation is recommended. No pleural effusion or pneumothorax. Stable cardiac silhouette. No acute osseous pathology. Degenerative changes of the spine and scoliosis. IMPRESSION: 1. Left lung  base atelectasis/scarring or infiltrate. 2. Faint focal rounded density in the right mid lung field may represent developing infiltrate. Electronically Signed   By: Anner Crete M.D.   On: 07/29/2020 16:27     Assessment & Plan:  Plan    Meds ordered this encounter  Medications  . albuterol (VENTOLIN HFA) 108 (90 Base) MCG/ACT inhaler    Sig: Inhale 2 puffs into the lungs every 6 (six) hours as needed for wheezing or shortness of breath.    Dispense:  8 g    Refill:  0    Problem List Items Addressed This Visit    Hyperlipidemia    Encouraged heart healthy diet, increase exercise, avoid trans fats, consider a krill oil cap daily      Essential hypertension    She reports her blood pressure is labile with highs and lows since she became ill with pneumonia. She is encouraged to increase her water and protein intake and if her blood pressure is low can hold blood pressure meds      Hyperglycemia    hgba1c acceptable, minimize simple carbs. Increase exercise as tolerated.       Vitamin D deficiency    Supplement and monitor      Pneumonia    Recently released from the hospital with pneumonia. She is feeling improved since returning home but still tired and her appetite is  only improving slowly. She has tolerated her antibiotic. CXR today shows improvement. Some rhonchi and hypoxia on evaluation, given an Albuterol to use prn and she will report worsening symptoms or seek care. Spoke with her son Remo Lipps at her request and he expresses understanding.       Relevant Medications   albuterol (VENTOLIN HFA) 108 (90 Base) MCG/ACT inhaler    Other Visit Diagnoses    Pulmonary infiltrate    -  Primary   Relevant Orders   CBC with Differential/Platelet   Comprehensive metabolic panel   DG Chest 2 View (Completed)   Cough       Relevant Medications   albuterol (VENTOLIN HFA) 108 (90 Base) MCG/ACT inhaler   Other Relevant Orders   CBC with Differential/Platelet   Comprehensive metabolic panel   DG Chest 2 View (Completed)   Rhonchi       Relevant Medications   albuterol (VENTOLIN HFA) 108 (90 Base) MCG/ACT inhaler   Hypocalcemia       Relevant Orders   VITAMIN D 25 Hydroxy (Vit-D Deficiency, Fractures)      Follow-up: Return in about 2 months (around 10/02/2020).   I,David Hanna,acting as a scribe for Penni Homans, MD.,have documented all relevant documentation on the behalf of Penni Homans, MD,as directed by  Penni Homans, MD while in the presence of Penni Homans, MD.  I, Mosie Lukes, MD personally performed the services described in this documentation. All medical record entries made by the scribe were at my direction and in my presence. I have reviewed the chart and agree that the record reflects my personal performance and is accurate and complete

## 2020-08-03 LAB — CBC WITH DIFFERENTIAL/PLATELET
Basophils Absolute: 0.1 10*3/uL (ref 0.0–0.1)
Basophils Relative: 0.8 % (ref 0.0–3.0)
Eosinophils Absolute: 0.2 10*3/uL (ref 0.0–0.7)
Eosinophils Relative: 1.7 % (ref 0.0–5.0)
HCT: 36 % (ref 36.0–46.0)
Hemoglobin: 12.1 g/dL (ref 12.0–15.0)
Lymphocytes Relative: 20.2 % (ref 12.0–46.0)
Lymphs Abs: 2 10*3/uL (ref 0.7–4.0)
MCHC: 33.7 g/dL (ref 30.0–36.0)
MCV: 95.1 fl (ref 78.0–100.0)
Monocytes Absolute: 0.8 10*3/uL (ref 0.1–1.0)
Monocytes Relative: 7.9 % (ref 3.0–12.0)
Neutro Abs: 7 10*3/uL (ref 1.4–7.7)
Neutrophils Relative %: 69.4 % (ref 43.0–77.0)
Platelets: 293 10*3/uL (ref 150.0–400.0)
RBC: 3.78 Mil/uL — ABNORMAL LOW (ref 3.87–5.11)
RDW: 14 % (ref 11.5–15.5)
WBC: 10.1 10*3/uL (ref 4.0–10.5)

## 2020-08-03 LAB — VITAMIN D 25 HYDROXY (VIT D DEFICIENCY, FRACTURES): VITD: 46.24 ng/mL (ref 30.00–100.00)

## 2020-08-05 DIAGNOSIS — J189 Pneumonia, unspecified organism: Secondary | ICD-10-CM | POA: Insufficient documentation

## 2020-08-05 NOTE — Assessment & Plan Note (Signed)
Encouraged heart healthy diet, increase exercise, avoid trans fats, consider a krill oil cap daily 

## 2020-08-05 NOTE — Assessment & Plan Note (Signed)
Supplement and monitor 

## 2020-08-05 NOTE — Assessment & Plan Note (Signed)
hgba1c acceptable, minimize simple carbs. Increase exercise as tolerated.  

## 2020-08-05 NOTE — Assessment & Plan Note (Signed)
Recently released from the hospital with pneumonia. She is feeling improved since returning home but still tired and her appetite is only improving slowly. She has tolerated her antibiotic. CXR today shows improvement. Some rhonchi and hypoxia on evaluation, given an Albuterol to use prn and she will report worsening symptoms or seek care. Spoke with her son Christine Henderson at her request and he expresses understanding.

## 2020-08-06 LAB — COMPREHENSIVE METABOLIC PANEL
ALT: 17 U/L (ref 0–35)
AST: 27 U/L (ref 0–37)
Albumin: 4.1 g/dL (ref 3.5–5.2)
Alkaline Phosphatase: 47 U/L (ref 39–117)
BUN: 42 mg/dL — ABNORMAL HIGH (ref 6–23)
CO2: 25 mEq/L (ref 19–32)
Calcium: 9.4 mg/dL (ref 8.4–10.5)
Chloride: 102 mEq/L (ref 96–112)
Creatinine, Ser: 1.2 mg/dL (ref 0.40–1.20)
GFR: 40.45 mL/min — ABNORMAL LOW (ref >60.00)
Glucose, Bld: 106 mg/dL — ABNORMAL HIGH (ref 70–99)
Potassium: 4.9 mEq/L (ref 3.5–5.1)
Sodium: 139 mEq/L (ref 135–145)
Total Bilirubin: 0.8 mg/dL (ref 0.2–1.2)
Total Protein: 7.1 g/dL (ref 6.0–8.3)

## 2020-08-15 NOTE — Telephone Encounter (Signed)
I called patient regarding transfer of care appt with Dr. Raoul Pitch.  She has decided at this time not to schedule appt but will call when she needs an appt.

## 2020-08-26 ENCOUNTER — Other Ambulatory Visit: Payer: Self-pay | Admitting: Family Medicine

## 2020-08-26 DIAGNOSIS — I10 Essential (primary) hypertension: Secondary | ICD-10-CM

## 2020-08-26 DIAGNOSIS — E785 Hyperlipidemia, unspecified: Secondary | ICD-10-CM

## 2020-09-02 ENCOUNTER — Emergency Department (HOSPITAL_COMMUNITY)
Admission: EM | Admit: 2020-09-02 | Discharge: 2020-09-02 | Disposition: A | Discharge status: Home / Self Care | Payer: Medicare Other | Attending: Emergency Medicine | Admitting: Emergency Medicine

## 2020-09-02 ENCOUNTER — Emergency Department (HOSPITAL_COMMUNITY): Payer: Medicare Other

## 2020-09-02 ENCOUNTER — Other Ambulatory Visit: Payer: Self-pay

## 2020-09-02 ENCOUNTER — Encounter (HOSPITAL_COMMUNITY): Payer: Self-pay | Admitting: Emergency Medicine

## 2020-09-02 DIAGNOSIS — I739 Peripheral vascular disease, unspecified: Secondary | ICD-10-CM | POA: Diagnosis not present

## 2020-09-02 DIAGNOSIS — Z79899 Other long term (current) drug therapy: Secondary | ICD-10-CM | POA: Diagnosis not present

## 2020-09-02 DIAGNOSIS — N1831 Chronic kidney disease, stage 3a: Secondary | ICD-10-CM | POA: Diagnosis not present

## 2020-09-02 DIAGNOSIS — R4182 Altered mental status, unspecified: Secondary | ICD-10-CM | POA: Diagnosis not present

## 2020-09-02 DIAGNOSIS — R9431 Abnormal electrocardiogram [ECG] [EKG]: Secondary | ICD-10-CM | POA: Diagnosis not present

## 2020-09-02 DIAGNOSIS — I129 Hypertensive chronic kidney disease with stage 1 through stage 4 chronic kidney disease, or unspecified chronic kidney disease: Secondary | ICD-10-CM | POA: Diagnosis not present

## 2020-09-02 DIAGNOSIS — R410 Disorientation, unspecified: Secondary | ICD-10-CM | POA: Diagnosis not present

## 2020-09-02 DIAGNOSIS — I10 Essential (primary) hypertension: Secondary | ICD-10-CM

## 2020-09-02 LAB — COMPREHENSIVE METABOLIC PANEL
ALT: 20 U/L (ref 0–44)
AST: 35 U/L (ref 15–41)
Albumin: 3.6 g/dL (ref 3.5–5.0)
Alkaline Phosphatase: 39 U/L (ref 38–126)
Anion gap: 7 (ref 5–15)
BUN: 27 mg/dL — ABNORMAL HIGH (ref 8–23)
CO2: 25 mmol/L (ref 22–32)
Calcium: 9.3 mg/dL (ref 8.9–10.3)
Chloride: 106 mmol/L (ref 98–111)
Creatinine, Ser: 1.11 mg/dL — ABNORMAL HIGH (ref 0.44–1.00)
GFR, Estimated: 48 mL/min — ABNORMAL LOW (ref >60)
Glucose, Bld: 120 mg/dL — ABNORMAL HIGH (ref 70–99)
Potassium: 4.1 mmol/L (ref 3.5–5.1)
Sodium: 138 mmol/L (ref 135–145)
Total Bilirubin: 1.5 mg/dL — ABNORMAL HIGH (ref 0.3–1.2)
Total Protein: 6.8 g/dL (ref 6.5–8.1)

## 2020-09-02 LAB — CBC WITH DIFFERENTIAL/PLATELET
Abs Immature Granulocytes: 0.04 10*3/uL (ref 0.00–0.07)
Basophils Absolute: 0 10*3/uL (ref 0.0–0.1)
Basophils Relative: 1 %
Eosinophils Absolute: 0.1 10*3/uL (ref 0.0–0.5)
Eosinophils Relative: 2 %
HCT: 38.6 % (ref 36.0–46.0)
Hemoglobin: 12.9 g/dL (ref 12.0–15.0)
Immature Granulocytes: 1 %
Lymphocytes Relative: 20 %
Lymphs Abs: 1.7 10*3/uL (ref 0.7–4.0)
MCH: 32.3 pg (ref 26.0–34.0)
MCHC: 33.4 g/dL (ref 30.0–36.0)
MCV: 96.5 fL (ref 80.0–100.0)
Monocytes Absolute: 0.7 10*3/uL (ref 0.1–1.0)
Monocytes Relative: 8 %
Neutro Abs: 5.6 10*3/uL (ref 1.7–7.7)
Neutrophils Relative %: 68 %
Platelets: 258 10*3/uL (ref 150–400)
RBC: 4 MIL/uL (ref 3.87–5.11)
RDW: 14.2 % (ref 11.5–15.5)
WBC: 8.1 10*3/uL (ref 4.0–10.5)
nRBC: 0 % (ref 0.0–0.2)

## 2020-09-02 NOTE — ED Provider Notes (Signed)
[facility] EMERGENCY DEPARTMENT Provider Note   CSN: [id number] Arrival date & time: 09/02/20  1717     History Chief Complaint  Patient presents with   Altered Mental Status    Christine Henderson is a 87 y.o. female.  Christine Henderson ate lunch with her family in [place] today.  She typically only drives short distances and in very familiar areas.  She was confused about how to get on [address], and instead of pulling over and calling her son, she kept attempting to drive and find something that she recognized.  She was brought in by EMS after police noticed that she was driving slowly and erratically in [place].  The patient states that she was aware of her situation at all times, and she simply did not want to bother her son.  She reports absolutely no symptoms.  She does have an elevated blood pressure reading, and she takes losartan for her blood pressure.  She takes this in the evenings, and she has had some erratic readings in the past.  The history is provided by the patient.  Altered Mental Status Presenting symptoms: confusion   Severity:  Mild Most recent episode:  Today Episode history:  Single Duration:  3 hours Timing:  Constant Progression:  Resolved Chronicity:  New Context: not dementia, not head injury, not recent change in medication and not recent illness   Associated symptoms: no abdominal pain, no difficulty breathing, no fever, no headaches, no light-headedness, no nausea, no palpitations, no rash, no seizures, no slurred speech, no visual change, no vomiting and no weakness       Past Medical History:  Diagnosis Date   Abdominal pain, unspecified site 06/26/2008   ALLERGIC RHINITIS CAUSE UNSPECIFIED 02/02/2008   Annual physical exam 12/26/2012   Diarrhea 06/26/2008   Fibrocystic breast disease    Headache(784.0) 10/01/2006   Hereditary and idiopathic peripheral neuropathy 01/10/2016   HYPERLIPIDEMIA 10/01/2006   HYPERTENSION 10/06/2007    IBS (irritable bowel syndrome)    Insomnia 11/14/2012   Irritable bowel syndrome 09/10/2006   Medicare annual wellness visit, subsequent 12/26/2012   Sees Dr Elinor Parkinson of opthamology.  MGM 5/14      Metatarsal stress fracture 01/20/2016   MIGRAINE HEADACHE 09/10/2006   MVP (mitral valve prolapse)    Onychomycosis 07/09/2014   OSTEOPENIA 09/10/2006   Osteopenia 11/12/2012   Palpitations 09/10/2006   PHOTOPHOBIA 07/20/2009   SKIN TAG 11/09/2006   VAGINITIS, ATROPHIC 10/01/2006   Vitamin D deficiency 07/14/2016    Patient Active Problem List   Diagnosis Date Noted   Pneumonia 08/05/2020   Vitamin D deficiency 07/14/2016   Hereditary and idiopathic peripheral neuropathy 01/10/2016   Leg lesion 07/09/2014   Paresthesia of foot, bilateral 07/09/2014   Hyperglycemia 07/09/2014   Onychomycosis 07/09/2014   Lumbar back pain with radiculopathy affecting left lower extremity 11/13/2013   Back pain 06/27/2013   Preventative health care 12/26/2012   Insomnia 11/14/2012   ALLERGIC RHINITIS CAUSE UNSPECIFIED 02/02/2008   Essential hypertension 10/06/2007   Hyperlipidemia 10/01/2006   VAGINITIS, ATROPHIC 10/01/2006   Migraine headache 09/10/2006   IRRITABLE BOWEL SYNDROME 09/10/2006   Disorder of bone and cartilage 09/10/2006    Past Surgical History:  Procedure Laterality Date   DENTAL SURGERY  09/2014   ROTATOR CUFF REPAIR Right    TONSILLECTOMY       OB History     Gravida  4   Para  4   Term  0  Preterm  0   AB  0   Living  0      SAB  0   IAB  0   Ectopic  0   Multiple  0   Live Births              Family History  Problem Relation Age of Onset   Inflammatory bowel disease Sister    Alzheimer's disease Sister    Pancreatic cancer Maternal Grandfather    Alzheimer's disease Mother    Hyperlipidemia Son    Obesity Son    Hyperlipidemia Son    Heart disease Paternal Uncle    Coronary artery disease Neg Hx     Social History   Tobacco Use    Smoking status: Never   Smokeless tobacco: Never  Substance Use Topics   Alcohol use: No   Drug use: No    Home Medications Prior to Admission medications   Medication Sig Start Date End Date Taking? Authorizing Provider  albuterol (VENTOLIN HFA) 108 (90 Base) MCG/ACT inhaler Inhale 2 puffs into the lungs every 6 (six) hours as needed for wheezing or shortness of breath. 08/02/20   Mosie Lukes, MD  Ascorbic Acid (VITAMIN C) 1000 MG tablet Take 1,000 mg by mouth daily.    [provider]  Calcium Carbonate-Vitamin D (CALCIUM PLUS VITAMIN D PO) Take 1,200 tablets by mouth daily.    [provider]  fish oil-omega-3 fatty acids 1000 MG capsule Take 2 g by mouth daily.    [provider]  Glucosamine-Chondroit-Vit C-Mn (GLUCOSAMINE 1500 COMPLEX) CAPS Take 1,500 capsules by mouth 2 (two) times daily.    [provider]  losartan (COZAAR) 25 MG tablet TAKE 1/2 TO 1 TABLET BY  MOUTH DAILY 08/27/20   Mosie Lukes, MD  Multiple Vitamin (MULTIVITAMIN) tablet Take 1 tablet by mouth daily.    [provider]  simvastatin (ZOCOR) 10 MG tablet TAKE 1 TABLET BY MOUTH AT  BEDTIME 08/27/20   Mosie Lukes, MD    Allergies    Codeine  Review of Systems   Review of Systems  Constitutional:  Negative for chills and fever.  HENT:  Negative for ear pain and sore throat.   Eyes:  Negative for pain and visual disturbance.  Respiratory:  Negative for cough and shortness of breath.   Cardiovascular:  Negative for chest pain and palpitations.  Gastrointestinal:  Negative for abdominal pain, nausea and vomiting.  Genitourinary:  Negative for dysuria and hematuria.  Musculoskeletal:  Negative for arthralgias and back pain.  Skin:  Negative for color change and rash.  Neurological:  Negative for seizures, syncope, weakness, light-headedness and headaches.  Psychiatric/Behavioral:  Positive for confusion.   All other systems reviewed and are  negative.  Physical Exam Updated Vital Signs BP (!) 190/117   Pulse 91   Temp 98.2 F (36.8 C) (Oral)   Resp 19   SpO2 96%   Physical Exam Vitals and nursing note reviewed.  Constitutional:      Appearance: She is well-developed.  HENT:     Head: Normocephalic and atraumatic.  Eyes:     Extraocular Movements: Extraocular movements intact.     Pupils: Pupils are equal, round, and reactive to light.  Cardiovascular:     Rate and Rhythm: Normal rate and regular rhythm.     Heart sounds: Normal heart sounds.  Pulmonary:     Effort: Pulmonary effort is normal. No tachypnea.     Breath  sounds: Normal breath sounds.  Abdominal:     Palpations: Abdomen is soft.     Tenderness: There is no abdominal tenderness.  Musculoskeletal:     Right lower leg: No edema.     Left lower leg: No edema.  Skin:    General: Skin is warm and dry.  Neurological:     General: No focal deficit present.     Mental Status: She is alert and oriented to person, place, and time.     Cranial Nerves: No cranial nerve deficit.     Sensory: No sensory deficit.     Motor: No weakness.     Coordination: Coordination normal.  Psychiatric:        Mood and Affect: Mood normal.        Behavior: Behavior normal.    ED Results / Procedures / Treatments   Labs (all labs ordered are listed, but only abnormal results are displayed) Labs Reviewed  COMPREHENSIVE METABOLIC PANEL - Abnormal; Notable for the following components:      Result Value   Glucose, Bld 120 (*)    BUN 27 (*)    Creatinine, Ser 1.11 (*)    Total Bilirubin 1.5 (*)    GFR, Estimated 48 (*)    All other components within normal limits  CBC WITH DIFFERENTIAL/PLATELET    EKG None  Radiology CT Head Wo Contrast  Result Date: 09/02/2020 CLINICAL DATA:  Mental status change.  Hypertensive. EXAM: CT HEAD WITHOUT CONTRAST TECHNIQUE: Contiguous axial images were obtained from the base of the skull through the vertex without intravenous  contrast. COMPARISON:  07/24/2020 FINDINGS: Brain: No evidence of acute infarction, hemorrhage, hydrocephalus, extra-axial collection or mass lesion/mass effect. Diffuse cerebral atrophy. Ventricular dilatation consistent with central atrophy. Patchy low-attenuation changes throughout the deep white matter consistent with small vessel ischemia. Vascular: Moderate intracranial arterial vascular calcifications. Skull: The calvarium appears intact. Sinuses/Orbits: Paranasal sinuses and mastoid air cells are clear. Other: None. IMPRESSION: No acute intracranial abnormalities. Chronic atrophy and small vessel ischemic changes. Electronically Signed   By: Lucienne Capers M.D.   On: 09/02/2020 19:10    Procedures Procedures   Medications Ordered in ED Medications - No data to display  ED Course  I have reviewed the triage vital signs and the nursing notes.  Pertinent labs & imaging results that were available during my care of the patient were reviewed by me and considered in my medical decision making (see chart for details).    MDM Rules/Calculators/A&P                          Christine Henderson presented after an episode of confusion during which time she had difficulty navigating back home.  She was found to have evidence of small vessel disease on her head CT.  I talked to her and her son about the importance of risk factor modification, and she was advised to follow-up with her doctor.  They have also discussed limiting her driving to situations that are safe.  Her lab work-up also reveals some mild chronic kidney disease, and they have also been counseled to follow-up with her doctor with regards to this.  Given that she is asymptomatic, and that she has a clear recollection of the entire event, no further testing was performed, and she was discharged home. Final Clinical Impression(s) / ED Diagnoses Final diagnoses:  Small vessel disease ([place])  Primary hypertension  Stage 3a chronic kidney  disease (  [facility])    Rx / DC Orders ED Discharge Orders     None        Arnaldo Natal, MD 09/02/20 [phone]

## 2020-09-02 NOTE — ED Triage Notes (Signed)
Pt BIB [employer], pt found driving slowly and erratically in [place], pt lives in [place]. Last seen between 1p-2p by her family while having lunch. EMS reports pt was initially hypertensive SBP 200, at this time pt answering questions appropriately.

## 2020-10-11 ENCOUNTER — Ambulatory Visit (INDEPENDENT_AMBULATORY_CARE_PROVIDER_SITE_OTHER): Payer: Medicare Other

## 2020-10-11 ENCOUNTER — Other Ambulatory Visit: Payer: Self-pay

## 2020-10-11 VITALS — BP 140/90 | HR 83 | Temp 97.7°F | Resp 16 | Ht 63.0 in | Wt 118.4 lb

## 2020-10-11 DIAGNOSIS — Z0000 Encounter for general adult medical examination without abnormal findings: Secondary | ICD-10-CM | POA: Diagnosis not present

## 2020-10-11 NOTE — Patient Instructions (Signed)
Christine Henderson , Thank you for taking time to come for your Medicare Wellness Visit. I appreciate your ongoing commitment to your health goals. Please review the following plan we discussed and let me know if I can assist you in the future.   Screening recommendations/referrals: Colonoscopy: No longer required Mammogram: Completed 04/19/2020-Due 04/19/2021 Bone Density: Completed 03/29/2019-Due 03/28/2021 Recommended yearly ophthalmology/optometry visit for glaucoma screening and checkup Recommended yearly dental visit for hygiene and checkup  Vaccinations: Influenza vaccine: Up to date Pneumococcal vaccine: Up to date Tdap vaccine: Up to date-Due-09/26/2024 Shingles vaccine: Completed vaccines   Covid-19:Up to date  Advanced directives: Copy in chart  Conditions/risks identified: See problem list  Next appointment: Follow up in one year for your annual wellness visit    Preventive Care 2 Years and Older, Female Preventive care refers to lifestyle choices and visits with your health care provider that can promote health and wellness. What does preventive care include? A yearly physical exam. This is also called an annual well check. Dental exams once or twice a year. Routine eye exams. Ask your health care provider how often you should have your eyes checked. Personal lifestyle choices, including: Daily care of your teeth and gums. Regular physical activity. Eating a healthy diet. Avoiding tobacco and drug use. Limiting alcohol use. Practicing safe sex. Taking low-dose aspirin every day. Taking vitamin and mineral supplements as recommended by your health care provider. What happens during an annual well check? The services and screenings done by your health care provider during your annual well check will depend on your age, overall health, lifestyle risk factors, and family history of disease. Counseling  Your health care provider may ask you questions about your: Alcohol  use. Tobacco use. Drug use. Emotional well-being. Home and relationship well-being. Sexual activity. Eating habits. History of falls. Memory and ability to understand (cognition). Work and work Statistician. Reproductive health. Screening  You may have the following tests or measurements: Height, weight, and BMI. Blood pressure. Lipid and cholesterol levels. These may be checked every 5 years, or more frequently if you are over 72 years old. Skin check. Lung cancer screening. You may have this screening every year starting at age 38 if you have a 30-pack-year history of smoking and currently smoke or have quit within the past 15 years. Fecal occult blood test (FOBT) of the stool. You may have this test every year starting at age 74. Flexible sigmoidoscopy or colonoscopy. You may have a sigmoidoscopy every 5 years or a colonoscopy every 10 years starting at age 20. Hepatitis C blood test. Hepatitis B blood test. Sexually transmitted disease (STD) testing. Diabetes screening. This is done by checking your blood sugar (glucose) after you have not eaten for a while (fasting). You may have this done every 1-3 years. Bone density scan. This is done to screen for osteoporosis. You may have this done starting at age 60. Mammogram. This may be done every 1-2 years. Talk to your health care provider about how often you should have regular mammograms. Talk with your health care provider about your test results, treatment options, and if necessary, the need for more tests. Vaccines  Your health care provider may recommend certain vaccines, such as: Influenza vaccine. This is recommended every year. Tetanus, diphtheria, and acellular pertussis (Tdap, Td) vaccine. You may need a Td booster every 10 years. Zoster vaccine. You may need this after age 74. Pneumococcal 13-valent conjugate (PCV13) vaccine. One dose is recommended after age 7. Pneumococcal polysaccharide (PPSV23) vaccine.  One dose is  recommended after age 36. Talk to your health care provider about which screenings and vaccines you need and how often you need them. This information is not intended to replace advice given to you by your health care provider. Make sure you discuss any questions you have with your health care provider. Document Released: 03/16/2015 Document Revised: 11/07/2015 Document Reviewed: 12/19/2014 Elsevier Interactive Patient Education  2017 [place] Prevention in the Home Falls can cause injuries. They can happen to people of all ages. There are many things you can do to make your home safe and to help prevent falls. What can I do on the outside of my home? Regularly fix the edges of walkways and driveways and fix any cracks. Remove anything that might make you trip as you walk through a door, such as a raised step or threshold. Trim any bushes or trees on the path to your home. Use bright outdoor lighting. Clear any walking paths of anything that might make someone trip, such as rocks or tools. Regularly check to see if handrails are loose or broken. Make sure that both sides of any steps have handrails. Any raised decks and porches should have guardrails on the edges. Have any leaves, snow, or ice cleared regularly. Use sand or salt on walking paths during winter. Clean up any spills in your garage right away. This includes oil or grease spills. What can I do in the bathroom? Use night lights. Install grab bars by the toilet and in the tub and shower. Do not use towel bars as grab bars. Use non-skid mats or decals in the tub or shower. If you need to sit down in the shower, use a plastic, non-slip stool. Keep the floor dry. Clean up any water that spills on the floor as soon as it happens. Remove soap buildup in the tub or shower regularly. Attach bath mats securely with double-sided non-slip rug tape. Do not have throw rugs and other things on the floor that can make you  trip. What can I do in the bedroom? Use night lights. Make sure that you have a light by your bed that is easy to reach. Do not use any sheets or blankets that are too big for your bed. They should not hang down onto the floor. Have a firm chair that has side arms. You can use this for support while you get dressed. Do not have throw rugs and other things on the floor that can make you trip. What can I do in the kitchen? Clean up any spills right away. Avoid walking on wet floors. Keep items that you use a lot in easy-to-reach places. If you need to reach something above you, use a strong step stool that has a grab bar. Keep electrical cords out of the way. Do not use floor polish or wax that makes floors slippery. If you must use wax, use non-skid floor wax. Do not have throw rugs and other things on the floor that can make you trip. What can I do with my stairs? Do not leave any items on the stairs. Make sure that there are handrails on both sides of the stairs and use them. Fix handrails that are broken or loose. Make sure that handrails are as long as the stairways. Check any carpeting to make sure that it is firmly attached to the stairs. Fix any carpet that is loose or worn. Avoid having throw rugs at the top or bottom of  the stairs. If you do have throw rugs, attach them to the floor with carpet tape. Make sure that you have a light switch at the top of the stairs and the bottom of the stairs. If you do not have them, ask someone to add them for you. What else can I do to help prevent falls? Wear shoes that: Do not have high heels. Have rubber bottoms. Are comfortable and fit you well. Are closed at the toe. Do not wear sandals. If you use a stepladder: Make sure that it is fully opened. Do not climb a closed stepladder. Make sure that both sides of the stepladder are locked into place. Ask someone to hold it for you, if possible. Clearly mark and make sure that you can  see: Any grab bars or handrails. First and last steps. Where the edge of each step is. Use tools that help you move around (mobility aids) if they are needed. These include: Canes. Walkers. Scooters. Crutches. Turn on the lights when you go into a dark area. Replace any light bulbs as soon as they burn out. Set up your furniture so you have a clear path. Avoid moving your furniture around. If any of your floors are uneven, fix them. If there are any pets around you, be aware of where they are. Review your medicines with your doctor. Some medicines can make you feel dizzy. This can increase your chance of falling. Ask your doctor what other things that you can do to help prevent falls. This information is not intended to replace advice given to you by your health care provider. Make sure you discuss any questions you have with your health care provider. Document Released: 12/14/2008 Document Revised: 07/26/2015 Document Reviewed: 03/24/2014 Elsevier Interactive Patient Education  2017 [employer].

## 2020-10-11 NOTE — Progress Notes (Signed)
Subjective:   KELEA FERO is a 87 y.o. female who presents for Medicare Annual (Subsequent) preventive examination.  Review of Systems     Cardiac Risk Factors include: advanced age (>13mn, >>21women);dyslipidemia     Objective:    Today's Vitals   10/11/20 1133  BP: 140/90  Pulse: 83  Resp: 16  Temp: 97.7 F (36.5 C)  TempSrc: Temporal  SpO2: 95%  Weight: 118 lb 6.4 oz (53.7 kg)  Height: '5\' 3"'$  (1.6 m)   Body mass index is 20.97 kg/m.  Advanced Directives 10/11/2020 07/29/2020 07/24/2020 03/18/2019 06/07/2018 06/02/2018 03/15/2018  Does Patient Have a Medical Advance Directive? Yes Yes Yes Yes Yes Yes Yes  Type of AParamedicof AMaramecLiving will Healthcare Power of A[place]teof AHerricksLiving will HColumbusLiving will HGrayson ValleyLiving will HWarrenLiving will  Does patient want to make changes to medical advance directive? - - - No - Patient declined - - No - Patient declined  Copy of HPlentywoodin Chart? Yes - validated most recent copy scanned in chart (See row information) No - copy requested No - copy requested Yes - validated most recent copy scanned in chart (See row information) Yes - validated most recent copy scanned in chart (See row information) - Yes - validated most recent copy scanned in chart (See row information)    Current Medications (verified) Outpatient Encounter Medications as of 10/11/2020  Medication Sig   albuterol (VENTOLIN HFA) 108 (90 Base) MCG/ACT inhaler Inhale 2 puffs into the lungs every 6 (six) hours as needed for wheezing or shortness of breath.   Ascorbic Acid (VITAMIN C) 1000 MG tablet Take 1,000 mg by mouth daily.   Calcium Carbonate-Vitamin D (CALCIUM PLUS VITAMIN D PO) Take 1,200 tablets by mouth daily.   fish oil-omega-3 fatty acids 1000 MG capsule Take 2 g by mouth daily.    Glucosamine-Chondroit-Vit C-Mn (GLUCOSAMINE 1500 COMPLEX) CAPS Take 1,500 capsules by mouth 2 (two) times daily.   losartan (COZAAR) 25 MG tablet TAKE 1/2 TO 1 TABLET BY  MOUTH DAILY   Multiple Vitamin (MULTIVITAMIN) tablet Take 1 tablet by mouth daily.   simvastatin (ZOCOR) 10 MG tablet TAKE 1 TABLET BY MOUTH AT  BEDTIME   No facility-administered encounter medications on file as of 10/11/2020.    Allergies (verified) Codeine   History: Past Medical History:  Diagnosis Date   Abdominal pain, unspecified site 06/26/2008   ALLERGIC RHINITIS CAUSE UNSPECIFIED 02/02/2008   Annual physical exam 12/26/2012   Diarrhea 06/26/2008   Fibrocystic breast disease    Headache(784.0) 10/01/2006   Hereditary and idiopathic peripheral neuropathy 01/10/2016   HYPERLIPIDEMIA 10/01/2006   HYPERTENSION 10/06/2007   IBS (irritable bowel syndrome)    Insomnia 11/14/2012   Irritable bowel syndrome 09/10/2006   Medicare annual wellness visit, subsequent 12/26/2012   Sees Dr CElinor Parkinsonof opthamology.  MGM 5/14      Metatarsal stress fracture 01/20/2016   MIGRAINE HEADACHE 09/10/2006   MVP (mitral valve prolapse)    Onychomycosis 07/09/2014   OSTEOPENIA 09/10/2006   Osteopenia 11/12/2012   Palpitations 09/10/2006   PHOTOPHOBIA 07/20/2009   SKIN TAG 11/09/2006   VAGINITIS, ATROPHIC 10/01/2006   Vitamin D deficiency 07/14/2016   Past Surgical History:  Procedure Laterality Date   DENTAL SURGERY  09/2014   ROTATOR CUFF REPAIR Right    TONSILLECTOMY     Family History  Problem Relation  Age of Onset   Inflammatory bowel disease Sister    Alzheimer's disease Sister    Pancreatic cancer Maternal Grandfather    Alzheimer's disease Mother    Hyperlipidemia Son    Obesity Son    Hyperlipidemia Son    Heart disease Paternal Uncle    Coronary artery disease Neg Hx    Social History   Socioeconomic History   Marital status: Widowed    Spouse name: Not on file   Number of children: 4   Years of education:  Not on file   Highest education level: Not on file  Occupational History   Occupation: retired    Fish farm manager: RETIRED  Tobacco Use   Smoking status: Never   Smokeless tobacco: Never  Substance and Sexual Activity   Alcohol use: No   Drug use: No   Sexual activity: Never    Birth control/protection: None    Comment: regular exercise, lives at the [place], widowed in 2014  Other Topics Concern   Not on file  Social History Narrative   Not on file   Social Determinants of Health   Financial Resource Strain: Low Risk    Difficulty of Paying Living Expenses: Not hard at all  Food Insecurity: No Food Insecurity   Worried About Charity fundraiser in the Last Year: Never true   Arboriculturist in the Last Year: Never true  Transportation Needs: No Transportation Needs   Lack of Transportation (Medical): No   Lack of Transportation (Non-Medical): No  Physical Activity: Insufficiently Active   Days of Exercise per Week: 4 days   Minutes of Exercise per Session: 30 min  Stress: No Stress Concern Present   Feeling of Stress : Not at all  Social Connections: Moderately Isolated   Frequency of Communication with Friends and Family: More than three times a week   Frequency of Social Gatherings with Friends and Family: More than three times a week   Attends Religious Services: More than 4 times per year   Active Member of [employer] or Organizations: No   Attends Archivist Meetings: Never   Marital Status: Widowed    Tobacco Counseling Counseling given: Not Answered   Clinical Intake:  Pre-visit preparation completed: Yes  Pain : No/denies pain     Nutritional Status: BMI of 19-24  Normal Nutritional Risks: None Diabetes: No  How often do you need to have someone help you when you read instructions, pamphlets, or other written materials from your doctor or pharmacy?: 1 - Never  Diabetic?No  Interpreter Needed?: No  Information entered by :: Caroleen Hamman  LPN   Activities of Daily Living In your present state of health, do you have any difficulty performing the following activities: 10/11/2020 08/02/2020  Hearing? N N  Vision? N N  Difficulty concentrating or making decisions? N N  Walking or climbing stairs? N N  Dressing or bathing? N N  Doing errands, shopping? N N  Preparing Food and eating ? N -  Using the Toilet? N -  In the past six months, have you accidently leaked urine? N -  Do you have problems with loss of bowel control? N -  Managing your Medications? N -  Managing your Finances? N -  Housekeeping or managing your Housekeeping? N -  Some recent data might be hidden    Patient Care Team: Mosie Lukes, MD as PCP - General (Family Medicine)  Indicate any recent Medical Services you may have  received from other than [facility] providers in the past year (date may be approximate).     Assessment:   This is a routine wellness examination for [facility].  Hearing/Vision screen Hearing Screening - Comments:: C/o mild hearing loss Vision Screening - Comments:: Last eye exam-03/2020-Dr. McCuen  Dietary issues and exercise activities discussed: Current Exercise Habits: Home exercise routine, Type of exercise: walking, Time (Minutes): 25, Frequency (Times/Week): 4, Weekly Exercise (Minutes/Week): 100, Intensity: Mild, Exercise limited by: None identified   Goals Addressed               This Visit's Progress     Patient Stated     Maintain current health (pt-stated)   On track     Other     Increase physical activity   On track     Begin walking again.       Patient Stated        Drink more water       Depression Screen PHQ 2/9 Scores 10/11/2020 08/02/2020 05/01/2020 03/18/2019 03/15/2018 01/13/2017 07/14/2016  PHQ - 2 Score 0 2 0 0 0 0 0  PHQ- 9 Score - 2 - - - - -    Fall Risk Fall Risk  10/11/2020 08/02/2020 03/18/2019 03/15/2018 01/13/2017  Falls in the past year? 0 1 1 0 No  Number falls in past yr: 0 - 0 - -  Injury  with Fall? 0 - 0 - -  Comment - - - - -  Risk for fall due to : - - - - -  Risk for fall due to: Comment - - - - -  Follow up Falls prevention discussed - Education provided;Falls prevention discussed - -    FALL RISK PREVENTION PERTAINING TO THE HOME:  Any stairs in or around the home? No  Home free of loose throw rugs in walkways, pet beds, electrical cords, etc? Yes  Adequate lighting in your home to reduce risk of falls? Yes   ASSISTIVE DEVICES UTILIZED TO PREVENT FALLS:  Life alert? No  Use of a cane, walker or w/c? No  Grab bars in the bathroom? No  Shower chair or bench in shower? Yes  Elevated toilet seat or a handicapped toilet? No   TIMED UP AND GO:  Was the test performed? Yes .  Length of time to ambulate 10 feet: 11 sec.   Gait slow and steady without use of assistive device  Cognitive Function:Normal cognitive status assessed by direct observation by this Nurse Health Advisor. No abnormalities found.   MMSE - Mini Mental State Exam 01/13/2017  Orientation to time 5  Orientation to Place 5  Registration 3  Attention/ Calculation 5  Recall 3  Language- name 2 objects 2  Language- repeat 1  Language- follow 3 step command 3  Language- read & follow direction 1  Write a sentence 1  Copy design 1  Total score 30        Immunizations Immunization History  Administered Date(s) Administered   Fluad Quad(high Dose 65+) 11/11/2018   Influenza Split 12/08/2011, 12/05/2015   Influenza Whole 12/11/2006, 12/14/2007, 12/26/2008, 11/06/2009, 11/11/2010   Influenza, High Dose Seasonal PF 01/08/2015, 12/04/2016   Influenza,inj,Quad PF,6+ Mos 11/12/2012, 12/30/2013   Influenza-Unspecified 12/01/2017, 01/25/2020   Moderna Sars-Covid-2 Vaccination 04/18/2019, 05/17/2019, 01/25/2020   Pneumococcal Conjugate-13 06/27/2013   Pneumococcal Polysaccharide-23 03/03/2002, 10/06/2007   Td 03/03/1997, 10/06/2007   Tdap 09/27/2014   Zoster Recombinat (Shingrix) 01/07/2017,  05/18/2017   Zoster, Live 03/03/2014  TDAP status: Up to date  Flu Vaccine status: Up to date  Pneumococcal vaccine status: Up to date  Covid-19 vaccine status: Completed vaccines  Qualifies for Shingles Vaccine? No   Zostavax completed Yes   Shingrix Completed?: Yes  Screening Tests Health Maintenance  Topic Date Due   COVID-19 Vaccine (4 - Booster for Moderna series) 05/24/2020   INFLUENZA VACCINE  10/01/2020   DEXA SCAN  03/28/2021   MAMMOGRAM  04/19/2021   TETANUS/TDAP  09/26/2024   PNA vac Low Risk Adult  Completed   Zoster Vaccines- Shingrix  Completed   HPV VACCINES  Aged Out    Health Maintenance  Health Maintenance Due  Topic Date Due   COVID-19 Vaccine (4 - Booster for Moderna series) 05/24/2020   INFLUENZA VACCINE  10/01/2020    Colorectal cancer screening: No longer required.   Mammogram status: Completed Bilateral 04/19/2020. Repeat every year  Bone Density status: Completed 03/29/2019. Results reflect: Bone density results: OSTEOPENIA. Repeat every 2 years.  Lung Cancer Screening: (Low Dose CT Chest recommended if Age 68-80 years, 30 pack-year currently smoking OR have quit w/in 15years.) does not qualify.     Additional Screening:  Hepatitis C Screening: does not qualify  Vision Screening: Recommended annual ophthalmology exams for early detection of glaucoma and other disorders of the eye. Is the patient up to date with their annual eye exam?  Yes  Who is the provider or what is the name of the office in which the patient attends annual eye exams? Dr. Ellie Lunch   Dental Screening: Recommended annual dental exams for proper oral hygiene  Community Resource Referral / Chronic Care Management: CRR required this visit?  No   CCM required this visit?  No      Plan:     I have personally reviewed and noted the following in the patient's chart:   Medical and social history Use of alcohol, tobacco or illicit drugs  Current medications and  supplements including opioid prescriptions.  Functional ability and status Nutritional status Physical activity Advanced directives List of other physicians Hospitalizations, surgeries, and ER visits in previous 12 months Vitals Screenings to include cognitive, depression, and falls Referrals and appointments  In addition, I have reviewed and discussed with patient certain preventive protocols, quality metrics, and best practice recommendations. A written personalized care plan for preventive services as well as general preventive health recommendations were provided to patient.     Marta Antu, LPN   [id number]  Nurse Health Advisor  Nurse Notes: None

## 2020-10-15 ENCOUNTER — Other Ambulatory Visit: Payer: Self-pay

## 2020-10-15 ENCOUNTER — Ambulatory Visit (INDEPENDENT_AMBULATORY_CARE_PROVIDER_SITE_OTHER): Payer: Medicare Other | Admitting: Family Medicine

## 2020-10-15 ENCOUNTER — Encounter: Payer: Self-pay | Admitting: Family Medicine

## 2020-10-15 VITALS — BP 126/72 | HR 97 | Temp 98.0°F | Resp 16 | Wt 119.4 lb

## 2020-10-15 DIAGNOSIS — E559 Vitamin D deficiency, unspecified: Secondary | ICD-10-CM | POA: Diagnosis not present

## 2020-10-15 DIAGNOSIS — R739 Hyperglycemia, unspecified: Secondary | ICD-10-CM | POA: Diagnosis not present

## 2020-10-15 DIAGNOSIS — I10 Essential (primary) hypertension: Secondary | ICD-10-CM | POA: Diagnosis not present

## 2020-10-15 DIAGNOSIS — K5900 Constipation, unspecified: Secondary | ICD-10-CM | POA: Diagnosis not present

## 2020-10-15 DIAGNOSIS — H6123 Impacted cerumen, bilateral: Secondary | ICD-10-CM | POA: Diagnosis not present

## 2020-10-15 DIAGNOSIS — E782 Mixed hyperlipidemia: Secondary | ICD-10-CM

## 2020-10-15 MED ORDER — NEOMYCIN-POLYMYXIN-HC 3.5-10000-1 OT SOLN: 4.0000 [drp] | Freq: Three times a day (TID) | OTIC | 0 refills | Status: DC

## 2020-10-15 NOTE — Patient Instructions (Addendum)
Plain mucinex 600-800 mg twice a day for 14 days  Hydrate with 60-80 ounces of water daily  Encouraged increased hydration and fiber in diet. Daily probiotics. If bowels not moving can use MOM 2 tbls po in 4 oz of warm prune juice by mouth every 2-3 days. If no results then repeat in 4 hours with  Dulcolax suppository pr, may repeat again in 4 more hours as needed. Seek care if symptoms worsen.   Consider daily Miralax with benefiber in beverage   Constipation, Adult Constipation is when a person has trouble pooping (having a bowel movement). When you have this condition, you may poop fewer than 3 times a week. Your poop (stool) may also be dry, hard, or bigger than normal. Follow these instructions at home: Eating and drinking  Eat foods that have a lot of fiber, such as: Fresh fruits and vegetables. Whole grains. Beans. Eat less of foods that are low in fiber and high in fat and sugar, such as: [place] fries. Hamburgers. Cookies. Candy. Soda. Drink enough fluid to keep your pee (urine) pale yellow.  General instructions Exercise regularly or as told by your doctor. Try to do 150 minutes of exercise each week. Go to the restroom when you feel like you need to poop. Do not hold it in. Take over-the-counter and prescription medicines only as told by your doctor. These include any fiber supplements. When you poop: Do deep breathing while relaxing your lower belly (abdomen). Relax your pelvic floor. The pelvic floor is a group of muscles that support the rectum, bladder, and intestines (as well as the uterus in women). Watch your condition for any changes. Tell your doctor if you notice any. Keep all follow-up visits as told by your doctor. This is important. Contact a doctor if: You have pain that gets worse. You have a fever. You have not pooped for 4 days. You vomit. You are not hungry. You lose weight. You are bleeding from the opening of the butt (anus). You have thin,  pencil-like poop. Get help right away if: You have a fever, and your symptoms suddenly get worse. You leak poop or have blood in your poop. Your belly feels hard or bigger than normal (bloated). You have very bad belly pain. You feel dizzy or you faint. Summary Constipation is when a person poops fewer than 3 times a week, has trouble pooping, or has poop that is dry, hard, or bigger than normal. Eat foods that have a lot of fiber. Drink enough fluid to keep your pee (urine) pale yellow. Take over-the-counter and prescription medicines only as told by your doctor. These include any fiber supplements. This information is not intended to replace advice given to you by your health care provider. Make sure you discuss any questions you have with your healthcare provider. Document Revised: 01/05/2019 Document Reviewed: 01/05/2019 Elsevier Patient Education  [place].

## 2020-10-15 NOTE — Progress Notes (Signed)
Subjective:   By signing my name below, I, Zite Okoli, attest that this documentation has been prepared under the direction and in the presence of Mosie Lukes, MD 10/15/2020    Patient ID: Christine Henderson, female    DOB: 02-27-1931, 87 y.o.   MRN: [id number]  Chief Complaint  Patient presents with   2 month f/u   Hyperlipidemia    HPI Patient is in today for an office visit.  She mentions that in the last few days, she has felt a lot of wax in her right ear. She explains she cannot hear in the ear and has tried to clear it up.  She had pneumonia in May 2022 and reports that since the, she always has phlegm in her throat and has to clear it often.  She reports she finds it hard to eat because nothing is appetizing. She also gets occasional constipation and bowel movements.  Past Medical History:  Diagnosis Date   Abdominal pain, unspecified site 06/26/2008   ALLERGIC RHINITIS CAUSE UNSPECIFIED 02/02/2008   Annual physical exam 12/26/2012   Diarrhea 06/26/2008   Fibrocystic breast disease    Headache(784.0) 10/01/2006   Hereditary and idiopathic peripheral neuropathy 01/10/2016   HYPERLIPIDEMIA 10/01/2006   HYPERTENSION 10/06/2007   IBS (irritable bowel syndrome)    Insomnia 11/14/2012   Irritable bowel syndrome 09/10/2006   Medicare annual wellness visit, subsequent 12/26/2012   Sees Dr Elinor Parkinson of opthamology.  MGM 5/14      Metatarsal stress fracture 01/20/2016   MIGRAINE HEADACHE 09/10/2006   MVP (mitral valve prolapse)    Onychomycosis 07/09/2014   OSTEOPENIA 09/10/2006   Osteopenia 11/12/2012   Palpitations 09/10/2006   PHOTOPHOBIA 07/20/2009   SKIN TAG 11/09/2006   VAGINITIS, ATROPHIC 10/01/2006   Vitamin D deficiency 07/14/2016    Past Surgical History:  Procedure Laterality Date   DENTAL SURGERY  09/2014   ROTATOR CUFF REPAIR Right    TONSILLECTOMY      Family History  Problem Relation Age of Onset   Inflammatory bowel disease Sister    Alzheimer's disease  Sister    Pancreatic cancer Maternal Grandfather    Alzheimer's disease Mother    Hyperlipidemia Son    Obesity Son    Hyperlipidemia Son    Heart disease Paternal Uncle    Coronary artery disease Neg Hx     Social History   Socioeconomic History   Marital status: Widowed    Spouse name: Not on file   Number of children: 4   Years of education: Not on file   Highest education level: Not on file  Occupational History   Occupation: retired    Fish farm manager: RETIRED  Tobacco Use   Smoking status: Never   Smokeless tobacco: Never  Substance and Sexual Activity   Alcohol use: No   Drug use: No   Sexual activity: Never    Birth control/protection: None    Comment: regular exercise, lives at the [facility], widowed in 2014  Other Topics Concern   Not on file  Social History Narrative   Not on file   Social Determinants of Health   Financial Resource Strain: Low Risk    Difficulty of Paying Living Expenses: Not hard at all  Food Insecurity: No Food Insecurity   Worried About Charity fundraiser in the Last Year: Never true   [place] in the Last Year: Never true  Transportation Needs: No Transportation Needs   Lack of Transportation (  Medical): No   Lack of Transportation (Non-Medical): No  Physical Activity: Insufficiently Active   Days of Exercise per Week: 4 days   Minutes of Exercise per Session: 30 min  Stress: No Stress Concern Present   Feeling of Stress : Not at all  Social Connections: Moderately Isolated   Frequency of Communication with Friends and Family: More than three times a week   Frequency of Social Gatherings with Friends and Family: More than three times a week   Attends Religious Services: More than 4 times per year   Active Member of [employer] or Organizations: No   Attends Archivist Meetings: Never   Marital Status: Widowed  Human resources officer Violence: Not At Risk   Fear of Current or Ex-Partner: No   Emotionally Abused: No   Physically  Abused: No   Sexually Abused: No    Outpatient Medications Prior to Visit  Medication Sig Dispense Refill   albuterol (VENTOLIN HFA) 108 (90 Base) MCG/ACT inhaler Inhale 2 puffs into the lungs every 6 (six) hours as needed for wheezing or shortness of breath. 8 g 0   Ascorbic Acid (VITAMIN C) 1000 MG tablet Take 1,000 mg by mouth daily.     Calcium Carbonate-Vitamin D (CALCIUM PLUS VITAMIN D PO) Take 1,200 tablets by mouth daily.     fish oil-omega-3 fatty acids 1000 MG capsule Take 2 g by mouth daily.     Glucosamine-Chondroit-Vit C-Mn (GLUCOSAMINE 1500 COMPLEX) CAPS Take 1,500 capsules by mouth 2 (two) times daily.     losartan (COZAAR) 25 MG tablet TAKE 1/2 TO 1 TABLET BY  MOUTH DAILY 90 tablet 3   Multiple Vitamin (MULTIVITAMIN) tablet Take 1 tablet by mouth daily.     simvastatin (ZOCOR) 10 MG tablet TAKE 1 TABLET BY MOUTH AT  BEDTIME 90 tablet 3   No facility-administered medications prior to visit.    Allergies  Allergen Reactions   Codeine     REACTION: Nausea  Vomiting    Review of Systems  Constitutional:  Negative for fever and malaise/fatigue.       (+) loss of appetite  HENT:  Positive for hearing loss (right ear). Negative for congestion.   Eyes:  Negative for redness.  Respiratory:  Negative for shortness of breath.   Cardiovascular:  Negative for chest pain, palpitations and leg swelling.  Gastrointestinal:  Positive for constipation. Negative for abdominal pain, blood in stool and nausea.  Genitourinary:  Negative for dysuria and frequency.  Musculoskeletal:  Negative for falls.  Skin:  Negative for rash.  Neurological:  Negative for dizziness, loss of consciousness and headaches.  Endo/Heme/Allergies:  Negative for polydipsia.  Psychiatric/Behavioral:  Negative for depression. The patient is not nervous/anxious.       Objective:    Physical Exam Constitutional:      General: She is not in acute distress.    Appearance: She is well-developed.  HENT:      Head: Normocephalic and atraumatic.     Ears:     Comments: Cerumen in both ears. Eyes:     Conjunctiva/sclera: Conjunctivae normal.  Neck:     Thyroid: No thyromegaly.  Cardiovascular:     Rate and Rhythm: Normal rate and regular rhythm.     Heart sounds: Normal heart sounds. No murmur heard. Pulmonary:     Effort: Pulmonary effort is normal. No respiratory distress.     Breath sounds: Normal breath sounds.  Abdominal:     General: Bowel sounds are normal. There is  no distension.     Palpations: Abdomen is soft. There is no mass.     Tenderness: There is no abdominal tenderness.  Musculoskeletal:     Cervical back: Neck supple.  Lymphadenopathy:     Cervical: No cervical adenopathy.  Skin:    General: Skin is warm and dry.  Neurological:     Mental Status: She is alert and oriented to person, place, and time.  Psychiatric:        Behavior: Behavior normal.    BP 126/72   Pulse 97   Temp 98 F (36.7 C)   Resp 16   Wt 119 lb 6.4 oz (54.2 kg)   SpO2 98%   BMI 21.15 kg/m  Wt Readings from Last 3 Encounters:  10/15/20 119 lb 6.4 oz (54.2 kg)  10/11/20 118 lb 6.4 oz (53.7 kg)  08/02/20 120 lb (54.4 kg)    Diabetic Foot Exam - Simple   No data filed    Lab Results  Component Value Date   WBC 8.1 09/02/2020   HGB 12.9 09/02/2020   HCT 38.6 09/02/2020   PLT 258 09/02/2020   GLUCOSE 120 (H) 09/02/2020   CHOL 157 05/08/2020   TRIG 78.0 05/08/2020   HDL 68.40 05/08/2020   LDLCALC 73 05/08/2020   ALT 20 09/02/2020   AST 35 09/02/2020   NA 138 09/02/2020   K 4.1 09/02/2020   CL 106 09/02/2020   CREATININE 1.11 (H) 09/02/2020   BUN 27 (H) 09/02/2020   CO2 25 09/02/2020   TSH 3.02 05/08/2020   HGBA1C 6.0 05/08/2020    Lab Results  Component Value Date   TSH 3.02 05/08/2020   Lab Results  Component Value Date   WBC 8.1 09/02/2020   HGB 12.9 09/02/2020   HCT 38.6 09/02/2020   MCV 96.5 09/02/2020   PLT 258 09/02/2020   Lab Results  Component Value  Date   NA 138 09/02/2020   K 4.1 09/02/2020   CO2 25 09/02/2020   GLUCOSE 120 (H) 09/02/2020   BUN 27 (H) 09/02/2020   CREATININE 1.11 (H) 09/02/2020   BILITOT 1.5 (H) 09/02/2020   ALKPHOS 39 09/02/2020   AST 35 09/02/2020   ALT 20 09/02/2020   PROT 6.8 09/02/2020   ALBUMIN 3.6 09/02/2020   CALCIUM 9.3 09/02/2020   ANIONGAP 7 09/02/2020   GFR 40.45 (L) 08/02/2020   Lab Results  Component Value Date   CHOL 157 05/08/2020   Lab Results  Component Value Date   HDL 68.40 05/08/2020   Lab Results  Component Value Date   LDLCALC 73 05/08/2020   Lab Results  Component Value Date   TRIG 78.0 05/08/2020   Lab Results  Component Value Date   CHOLHDL 2 05/08/2020   Lab Results  Component Value Date   HGBA1C 6.0 05/08/2020       Assessment & Plan:   Problem List Items Addressed This Visit     Hyperlipidemia    Encourage heart healthy diet such as MIND or DASH diet, increase exercise, avoid trans fats, simple carbohydrates and processed foods, consider a krill or fish or flaxseed oil cap daily.       Relevant Orders   Lipid panel   Essential hypertension    Well controlled, no changes to meds. Encouraged heart healthy diet such as the DASH diet and exercise as tolerated.       Relevant Orders   CBC   TSH   Urinalysis   Hyperglycemia  hgba1c acceptable, minimize simple carbs. Increase exercise as tolerated.       Relevant Orders   Comprehensive metabolic panel   Hemoglobin A1c   Vitamin D deficiency    Supplement and monitor      Relevant Orders   VITAMIN D 25 Hydroxy (Vit-D Deficiency, Fractures)   Bilateral impacted cerumen    Her right ear is disimpacted and she tolerates it well but the skin is erythematous and fragile. Started on Cortisporin otic drops. She will let us know if she would like the left ear disimpacted and apply 3-5 drops of hydrogen peroxide in left ear qhs for 7 days prior to visit      Other Visit Diagnoses     Constipation,  unspecified constipation type    -  Primary   Relevant Orders   Urinalysis        Meds ordered this encounter  Medications   neomycin-polymyxin-hydrocortisone (CORTISPORIN) OTIC solution    Sig: Place 4 drops into the right ear 3 (three) times daily.    Dispense:  10 mL    Refill:  0    I,Zite Okoli,acting as a scribe for Penni Homans, MD.,have documented all relevant documentation on the behalf of Penni Homans, MD,as directed by  Penni Homans, MD while in the presence of Penni Homans, MD.   I, Mosie Lukes, MD , personally preformed the services described in this documentation.  All medical record entries made by the scribe were at my direction and in my presence.  I have reviewed the chart and discharge instructions (if applicable) and agree that the record reflects my personal performance and is accurate and complete. 10/15/2020

## 2020-10-21 DIAGNOSIS — H6123 Impacted cerumen, bilateral: Secondary | ICD-10-CM | POA: Insufficient documentation

## 2020-10-21 HISTORY — DX: Impacted cerumen, bilateral: H61.23

## 2020-10-21 NOTE — Assessment & Plan Note (Signed)
Encourage heart healthy diet such as MIND or DASH diet, increase exercise, avoid trans fats, simple carbohydrates and processed foods, consider a krill or fish or flaxseed oil cap daily.  °

## 2020-10-21 NOTE — Assessment & Plan Note (Signed)
Her right ear is disimpacted and she tolerates it well but the skin is erythematous and fragile. Started on Cortisporin otic drops. She will let us know if she would like the left ear disimpacted and apply 3-5 drops of hydrogen peroxide in left ear qhs for 7 days prior to visit

## 2020-10-21 NOTE — Assessment & Plan Note (Signed)
Supplement and monitor 

## 2020-10-21 NOTE — Assessment & Plan Note (Signed)
hgba1c acceptable, minimize simple carbs. Increase exercise as tolerated.  

## 2020-10-21 NOTE — Assessment & Plan Note (Signed)
Well controlled, no changes to meds. Encouraged heart healthy diet such as the DASH diet and exercise as tolerated.  °

## 2020-10-23 ENCOUNTER — Emergency Department (HOSPITAL_BASED_OUTPATIENT_CLINIC_OR_DEPARTMENT_OTHER)
Admission: EM | Admit: 2020-10-23 | Discharge: 2020-10-23 | Disposition: A | Discharge status: Home / Self Care | Payer: Medicare Other | Attending: Emergency Medicine | Admitting: Emergency Medicine

## 2020-10-23 ENCOUNTER — Encounter (HOSPITAL_BASED_OUTPATIENT_CLINIC_OR_DEPARTMENT_OTHER): Payer: Self-pay

## 2020-10-23 ENCOUNTER — Other Ambulatory Visit: Payer: Self-pay

## 2020-10-23 ENCOUNTER — Emergency Department (HOSPITAL_BASED_OUTPATIENT_CLINIC_OR_DEPARTMENT_OTHER): Payer: Medicare Other

## 2020-10-23 DIAGNOSIS — I10 Essential (primary) hypertension: Secondary | ICD-10-CM | POA: Diagnosis not present

## 2020-10-23 DIAGNOSIS — G319 Degenerative disease of nervous system, unspecified: Secondary | ICD-10-CM | POA: Diagnosis not present

## 2020-10-23 DIAGNOSIS — R42 Dizziness and giddiness: Secondary | ICD-10-CM | POA: Diagnosis not present

## 2020-10-23 LAB — BASIC METABOLIC PANEL
Anion gap: 8 (ref 5–15)
BUN: 27 mg/dL — ABNORMAL HIGH (ref 8–23)
CO2: 27 mmol/L (ref 22–32)
Calcium: 9.5 mg/dL (ref 8.9–10.3)
Chloride: 103 mmol/L (ref 98–111)
Creatinine, Ser: 0.9 mg/dL (ref 0.44–1.00)
GFR, Estimated: >60 mL/min (ref >60)
Glucose, Bld: 115 mg/dL — ABNORMAL HIGH (ref 70–99)
Potassium: 4.2 mmol/L (ref 3.5–5.1)
Sodium: 138 mmol/L (ref 135–145)

## 2020-10-23 LAB — CBC WITH DIFFERENTIAL/PLATELET
Abs Immature Granulocytes: 0.02 10*3/uL (ref 0.00–0.07)
Basophils Absolute: 0 10*3/uL (ref 0.0–0.1)
Basophils Relative: 1 %
Eosinophils Absolute: 0.3 10*3/uL (ref 0.0–0.5)
Eosinophils Relative: 4 %
HCT: 37.9 % (ref 36.0–46.0)
Hemoglobin: 13 g/dL (ref 12.0–15.0)
Immature Granulocytes: 0 %
Lymphocytes Relative: 34 %
Lymphs Abs: 2.6 10*3/uL (ref 0.7–4.0)
MCH: 32.7 pg (ref 26.0–34.0)
MCHC: 34.3 g/dL (ref 30.0–36.0)
MCV: 95.2 fL (ref 80.0–100.0)
Monocytes Absolute: 0.7 10*3/uL (ref 0.1–1.0)
Monocytes Relative: 9 %
Neutro Abs: 4 10*3/uL (ref 1.7–7.7)
Neutrophils Relative %: 52 %
Platelets: 230 10*3/uL (ref 150–400)
RBC: 3.98 MIL/uL (ref 3.87–5.11)
RDW: 14.3 % (ref 11.5–15.5)
WBC: 7.6 10*3/uL (ref 4.0–10.5)
nRBC: 0 % (ref 0.0–0.2)

## 2020-10-23 MED ORDER — ACETAMINOPHEN 325 MG PO TABS
650.0000 mg | ORAL_TABLET | Freq: Once | ORAL | Status: AC
  Administered 2020-10-23: 650 mg via ORAL
  Filled 2020-10-23: qty 2

## 2020-10-23 MED ORDER — LABETALOL HCL 5 MG/ML IV SOLN
5.0000 mg | Freq: Once | INTRAVENOUS | Status: AC
  Administered 2020-10-23: 5 mg via INTRAVENOUS
  Filled 2020-10-23: qty 4

## 2020-10-23 NOTE — Discharge Instructions (Addendum)
You were seen today for elevated blood pressures and dizziness with headache.  Your work-up is largely reassuring.  You have been noted to have some labile blood pressures.  You need to follow-up closely with your primary doctor for blood pressure recheck and potential adjustment of your medication.  If you have any new or worsening symptoms, you should be reevaluated.

## 2020-10-23 NOTE — ED Provider Notes (Signed)
[facility] Provider Note   CSN: [id number] Arrival date & time: 10/23/20  0028     History Chief Complaint  Patient presents with   Hypertension    Christine Henderson is a 87 y.o. female.  HPI     This is an 87 year old female with a history of hypertension, hyperlipidemia, migraine who presents with elevated blood pressures and lightheadedness.  Patient reports that she did not feel well earlier this evening.  She describes a posterior headache and some lightheadedness.  She took her blood pressure and noted it to be elevated.  She reports 1 week ago at her primary office her blood pressure was normal.  She normally takes losartan and has been taking losartan as prescribed.  Denies weakness, numbness, speech difficulty, strokelike symptoms.  She describes her lightheadedness as just that and no room spinning dizziness.  She states she has a history of headaches but has not had WITH headaches in several years.  She reports that it is posterior.  It is currently 4 out of 10.  She denies chest pain, shortness of breath.  Past Medical History:  Diagnosis Date   Abdominal pain, unspecified site 06/26/2008   ALLERGIC RHINITIS CAUSE UNSPECIFIED 02/02/2008   Annual physical exam 12/26/2012   Diarrhea 06/26/2008   Fibrocystic breast disease    Headache(784.0) 10/01/2006   Hereditary and idiopathic peripheral neuropathy 01/10/2016   HYPERLIPIDEMIA 10/01/2006   HYPERTENSION 10/06/2007   IBS (irritable bowel syndrome)    Insomnia 11/14/2012   Irritable bowel syndrome 09/10/2006   Medicare annual wellness visit, subsequent 12/26/2012   Sees Dr Elinor Parkinson of opthamology.  MGM 5/14      Metatarsal stress fracture 01/20/2016   MIGRAINE HEADACHE 09/10/2006   MVP (mitral valve prolapse)    Onychomycosis 07/09/2014   OSTEOPENIA 09/10/2006   Osteopenia 11/12/2012   Palpitations 09/10/2006   PHOTOPHOBIA 07/20/2009   SKIN TAG 11/09/2006   VAGINITIS, ATROPHIC 10/01/2006    Vitamin D deficiency 07/14/2016    Patient Active Problem List   Diagnosis Date Noted   Bilateral impacted cerumen 10/21/2020   Pneumonia 08/05/2020   Vitamin D deficiency 07/14/2016   Hereditary and idiopathic peripheral neuropathy 01/10/2016   Leg lesion 07/09/2014   Paresthesia of foot, bilateral 07/09/2014   Hyperglycemia 07/09/2014   Onychomycosis 07/09/2014   Lumbar back pain with radiculopathy affecting left lower extremity 11/13/2013   Back pain 06/27/2013   Preventative health care 12/26/2012   Insomnia 11/14/2012   ALLERGIC RHINITIS CAUSE UNSPECIFIED 02/02/2008   Essential hypertension 10/06/2007   Hyperlipidemia 10/01/2006   VAGINITIS, ATROPHIC 10/01/2006   Migraine headache 09/10/2006   IRRITABLE BOWEL SYNDROME 09/10/2006   Disorder of bone and cartilage 09/10/2006    Past Surgical History:  Procedure Laterality Date   DENTAL SURGERY  09/2014   ROTATOR CUFF REPAIR Right    TONSILLECTOMY       OB History     Gravida  4   Para  4   Term  0   Preterm  0   AB  0   Living  0      SAB  0   IAB  0   Ectopic  0   Multiple  0   Live Births              Family History  Problem Relation Age of Onset   Inflammatory bowel disease Sister    Alzheimer's disease Sister    Pancreatic cancer Maternal Grandfather  Alzheimer's disease Mother    Hyperlipidemia Son    Obesity Son    Hyperlipidemia Son    Heart disease Paternal Uncle    Coronary artery disease Neg Hx     Social History   Tobacco Use   Smoking status: Never   Smokeless tobacco: Never  Substance Use Topics   Alcohol use: No   Drug use: No    Home Medications Prior to Admission medications   Medication Sig Start Date End Date Taking? Authorizing Provider  albuterol (VENTOLIN HFA) 108 (90 Base) MCG/ACT inhaler Inhale 2 puffs into the lungs every 6 (six) hours as needed for wheezing or shortness of breath. 08/02/20   Mosie Lukes, MD  Ascorbic Acid (VITAMIN C) 1000 MG tablet  Take 1,000 mg by mouth daily.    [provider]  Calcium Carbonate-Vitamin D (CALCIUM PLUS VITAMIN D PO) Take 1,200 tablets by mouth daily.    [provider]  fish oil-omega-3 fatty acids 1000 MG capsule Take 2 g by mouth daily.    [provider]  Glucosamine-Chondroit-Vit C-Mn (GLUCOSAMINE 1500 COMPLEX) CAPS Take 1,500 capsules by mouth 2 (two) times daily.    [provider]  losartan (COZAAR) 25 MG tablet TAKE 1/2 TO 1 TABLET BY  MOUTH DAILY 08/27/20   Mosie Lukes, MD  Multiple Vitamin (MULTIVITAMIN) tablet Take 1 tablet by mouth daily.    [provider]  neomycin-polymyxin-hydrocortisone (CORTISPORIN) OTIC solution Place 4 drops into the right ear 3 (three) times daily. 10/15/20   Mosie Lukes, MD  simvastatin (ZOCOR) 10 MG tablet TAKE 1 TABLET BY MOUTH AT  BEDTIME 08/27/20   Mosie Lukes, MD    Allergies    Codeine  Review of Systems   Review of Systems  Constitutional:  Negative for fever.  Respiratory:  Negative for shortness of breath.   Cardiovascular:  Negative for chest pain.  Gastrointestinal:  Negative for abdominal pain.  Neurological:  Positive for dizziness, light-headedness and headaches. Negative for speech difficulty, weakness and numbness.  All other systems reviewed and are negative.  Physical Exam Updated Vital Signs BP (!) 182/106   Pulse 71   Temp 98.7 F (37.1 C) (Oral)   Resp 19   Ht 1.626 m ('5\' 4"'$ )   Wt 54.4 kg   SpO2 93%   BMI 20.60 kg/m   Physical Exam Vitals and nursing note reviewed.  Constitutional:      Appearance: She is well-developed. She is not ill-appearing.  HENT:     Head: Normocephalic and atraumatic.     Mouth/Throat:     Mouth: Mucous membranes are moist.  Eyes:     Pupils: Pupils are equal, round, and reactive to light.  Cardiovascular:     Rate and Rhythm: Normal rate and regular rhythm.     Heart sounds: Normal heart sounds.  Pulmonary:     Effort: Pulmonary effort is  normal. No respiratory distress.     Breath sounds: No wheezing.  Abdominal:     General: Bowel sounds are normal.     Palpations: Abdomen is soft.  Musculoskeletal:     Cervical back: Neck supple.     Right lower leg: No edema.     Left lower leg: No edema.  Skin:    General: Skin is warm and dry.  Neurological:     Mental Status: She is alert and oriented to person, place, and time.     Comments: Cranial nerves II through XII intact,  5 out of 5 strength in all 4 extremities, no dysmetria to finger-nose-finger, fluent speech  Psychiatric:        Mood and Affect: Mood normal.    ED Results / Procedures / Treatments   Labs (all labs ordered are listed, but only abnormal results are displayed) Labs Reviewed  BASIC METABOLIC PANEL - Abnormal; Notable for the following components:      Result Value   Glucose, Bld 115 (*)    BUN 27 (*)    All other components within normal limits  CBC WITH DIFFERENTIAL/PLATELET    EKG EKG Interpretation  Date/Time:  Tuesday October 23 2020 00:46:18 EDT Ventricular Rate:  73 PR Interval:  234 QRS Duration: 87 QT Interval:  393 QTC Calculation: 433 R Axis:   -74 Text Interpretation: Sinus rhythm Atrial premature complex Prolonged PR interval LAD, consider left anterior fascicular block Confirmed by Thayer Jew [phone]) on 10/23/2020 1:59:17 AM  Radiology CT HEAD WO CONTRAST (5MM)  Result Date: 10/23/2020 CLINICAL DATA:  Headache, intracranial hemorrhage suspected. Lightheadedness, hypertension. EXAM: CT HEAD WITHOUT CONTRAST TECHNIQUE: Contiguous axial images were obtained from the base of the skull through the vertex without intravenous contrast. COMPARISON:  09/02/2020 FINDINGS: Brain: Normal anatomic configuration. Parenchymal volume loss is commensurate with the patient's age. Moderate periventricular white matter changes are present likely reflecting the sequela of small vessel ischemia. No abnormal intra or extra-axial mass lesion or  fluid collection. No abnormal mass effect or midline shift. No evidence of acute intracranial hemorrhage or infarct. Ventricular size is stable and commensurate with the degree of cerebral atrophy. Cerebellum unremarkable. Vascular: No asymmetric hyperdense vasculature at the skull base. Skull: Intact Sinuses/Orbits: Paranasal sinuses are clear. Orbits are unremarkable. Other: Mastoid air cells and middle ear cavities are clear. IMPRESSION: No acute intracranial abnormality. Stable senescent change. Electronically Signed   By: Fidela Salisbury M.D.   On: 10/23/2020 01:15    Procedures Procedures   Medications Ordered in ED Medications  acetaminophen (TYLENOL) tablet 650 mg (650 mg Oral Given 10/23/20 0210)  labetalol (NORMODYNE) injection 5 mg (5 mg Intravenous Given 10/23/20 [id number])    ED Course  I have reviewed the triage vital signs and the nursing notes.  Pertinent labs & imaging results that were available during my care of the patient were reviewed by me and considered in my medical decision making (see chart for details).    MDM Rules/Calculators/A&P                           Patient presents with lightheadedness and concerns for high blood pressure.  She is nontoxic.  Initial blood pressures are elevated.  I reviewed her prior office notes and ED visits.  Last week her blood pressure was normal in office.  However, she has had multiple ED visits in the past with labile blood pressures and similar symptoms.  Her physical exam is reassuring.  Neurologic exam is normal.  No evidence of cerebellar dysfunction or stroke.  CT head shows no evidence of acute intracranial bleed.  EKG is without acute ischemic or arrhythmic changes.  Basic lab work is reassuring without metabolic derangements or endorgan damage noted.  Patient was given a dose of labetalol and Tylenol.  Headache overall improved as did dizziness.  Blood pressure is marginally improved at best.  Patient now asymptomatic.  She denies any  over-the-counter medications which could be causing elevated blood pressures.  Discussed with patient that she needs to  follow-up closely with her primary physician for blood pressure recheck and potential adjustment.  She was given strict return precautions.  At this time no signs or symptoms of hypertensive urgency or emergency.  After history, exam, and medical workup I feel the patient has been appropriately medically screened and is safe for discharge home. Pertinent diagnoses were discussed with the patient. Patient was given return precautions.  Final Clinical Impression(s) / ED Diagnoses Final diagnoses:  Dizziness  Hypertension, unspecified type    Rx / DC Orders ED Discharge Orders     None        Keefe Zawistowski, Barbette Hair, MD 10/23/20 [phone]

## 2020-10-23 NOTE — ED Triage Notes (Signed)
Pt with hx of HTN reports she was routinely checking her BP tonight and it was elevated at 195/117. She took Losartan tonight around 1130pm. She endorses mild HA and lightheadedness when walking. Denies numbness, tingling or weakness. A&OX4, ambulatory to room with independent steady gait.

## 2020-10-23 NOTE — ED Notes (Signed)
Due to pt being dizzy while standing did not do the standing at 3 min.

## 2020-10-23 NOTE — ED Notes (Signed)
Pt laid flat at 124 for orthostatic vitals

## 2020-10-25 ENCOUNTER — Other Ambulatory Visit: Payer: Self-pay

## 2020-10-25 ENCOUNTER — Ambulatory Visit (INDEPENDENT_AMBULATORY_CARE_PROVIDER_SITE_OTHER): Payer: Medicare Other | Admitting: Medical

## 2020-10-25 VITALS — BP 150/88 | HR 84 | Temp 98.0°F | Resp 18 | Ht 63.0 in | Wt 118.6 lb

## 2020-10-25 DIAGNOSIS — I10 Essential (primary) hypertension: Secondary | ICD-10-CM

## 2020-10-25 DIAGNOSIS — H6122 Impacted cerumen, left ear: Secondary | ICD-10-CM | POA: Diagnosis not present

## 2020-10-25 NOTE — Patient Instructions (Addendum)
History of hypertension with recent spike in blood pressure on Monday when seen in the emergency department.  Today initial blood pressure reading done by MA was close to ideal.  On second check by myself manually blood pressure was 150/88.  Recommend checking blood pressure daily over the next week.  If blood pressure levels are over 140/90 then go ahead and increase losartan to 50 mg / 2 of the 25 mg tablets.    If any recurrent severe spike as before or any cardiac or neurologic signs or symptoms and recommend ED evaluation.  For cerumen impaction we did ear lavage today. Partial succesfull but still good amount present post lavage. Advise debrox over the counter to ear 4-5 days. Follow up for repeat wash out in 6-7 days. If can't remove at that point then refer to ENT.  Follow up in 7 days recheck bp or sooner if needed

## 2020-10-25 NOTE — Progress Notes (Signed)
Subjective:    Patient ID: Christine Henderson, female    DOB: 11-13-1930, 87 y.o.   MRN: [id number]  HPI  Pt states that she feels well presently. No ha, no dizziness, no nausea or vomiting. No chest pain or neurologic signs/symptoms.  Bp today 132/82.    Monday ED evaluation.   Arrival date & time: 10/23/20  0028     History    Chief Complaint  Patient presents with   Hypertension      Christine Henderson is a 87 y.o. female.   HPI     "This is an 87 year old female with a history of hypertension, hyperlipidemia, migraine who presents with elevated blood pressures and lightheadedness.  Patient reports that she did not feel well earlier this evening.  She describes a posterior headache and some lightheadedness.  She took her blood pressure and noted it to be elevated.  She reports 1 week ago at her primary office her blood pressure was normal.  She normally takes losartan and has been taking losartan as prescribed.  Denies weakness, numbness, speech difficulty, strokelike symptoms.  She describes her lightheadedness as just that and no room spinning dizziness.  She states she has a history of headaches but has not had WITH headaches in several years.  She reports that it is posterior.  It is currently 4 out of 10.  She denies chest pain, shortness of breath.  Bp in ED 182/106.     Patient presents with lightheadedness and concerns for high blood pressure.  She is nontoxic.  Initial blood pressures are elevated.  I reviewed her prior office notes and ED visits.  Last week her blood pressure was normal in office.  However, she has had multiple ED visits in the past with labile blood pressures and similar symptoms.  Her physical exam is reassuring.  Neurologic exam is normal.  No evidence of cerebellar dysfunction or stroke.  CT head shows no evidence of acute intracranial bleed.  EKG is without acute ischemic or arrhythmic changes.  Basic lab work is reassuring without metabolic  derangements or endorgan damage noted.  Patient was given a dose of labetalol and Tylenol.  Headache overall improved as did dizziness.  Blood pressure is marginally improved at best.  Patient now asymptomatic.  She denies any over-the-counter medications which could be causing elevated blood pressures.  Discussed with patient that she needs to follow-up closely with her primary physician for blood pressure recheck and potential adjustment.  She was given strict return precautions.  At this time no signs or symptoms of hypertensive urgency or emergency.   After history, exam, and medical workup I feel the patient has been appropriately medically screened and is safe for discharge home. Pertinent diagnoses were discussed with the patient. Patient was given return precautions."  Pt was given injection fo labetolol 5 mg im.  Pt is on losartan 25 mg daily.   Pt has bp cuff at home. More than 85 years old machine. Pt reports no issues with bp readings.   Pt checked bp yesterday at home. She can't remember those readings.   Review of Systems  Constitutional:  Negative for chills, fatigue and fever.  HENT:  Negative for congestion and drooling.   Respiratory:  Negative for cough, chest tightness, shortness of breath and wheezing.   Cardiovascular:  Negative for chest pain and palpitations.  Gastrointestinal:  Negative for abdominal pain.  Musculoskeletal:  Negative for back pain and myalgias.  Skin:  Negative for  rash.  Psychiatric/Behavioral:  Negative for behavioral problems, decreased concentration, dysphoric mood, sleep disturbance and suicidal ideas. The patient is not nervous/anxious.    Past Medical History:  Diagnosis Date   Abdominal pain, unspecified site 06/26/2008   ALLERGIC RHINITIS CAUSE UNSPECIFIED 02/02/2008   Annual physical exam 12/26/2012   Diarrhea 06/26/2008   Fibrocystic breast disease    Headache(784.0) 10/01/2006   Hereditary and idiopathic peripheral neuropathy 01/10/2016    HYPERLIPIDEMIA 10/01/2006   HYPERTENSION 10/06/2007   IBS (irritable bowel syndrome)    Insomnia 11/14/2012   Irritable bowel syndrome 09/10/2006   Medicare annual wellness visit, subsequent 12/26/2012   Sees Dr Elinor Parkinson of opthamology.  MGM 5/14      Metatarsal stress fracture 01/20/2016   MIGRAINE HEADACHE 09/10/2006   MVP (mitral valve prolapse)    Onychomycosis 07/09/2014   OSTEOPENIA 09/10/2006   Osteopenia 11/12/2012   Palpitations 09/10/2006   PHOTOPHOBIA 07/20/2009   SKIN TAG 11/09/2006   VAGINITIS, ATROPHIC 10/01/2006   Vitamin D deficiency 07/14/2016     Social History   Socioeconomic History   Marital status: Widowed    Spouse name: Not on file   Number of children: 4   Years of education: Not on file   Highest education level: Not on file  Occupational History   Occupation: retired    Fish farm manager: RETIRED  Tobacco Use   Smoking status: Never   Smokeless tobacco: Never  Substance and Sexual Activity   Alcohol use: No   Drug use: No   Sexual activity: Never    Birth control/protection: None    Comment: regular exercise, lives at the [place], widowed in 2014  Other Topics Concern   Not on file  Social History Narrative   Not on file   Social Determinants of Health   Financial Resource Strain: Low Risk    Difficulty of Paying Living Expenses: Not hard at all  Food Insecurity: No Food Insecurity   Worried About Charity fundraiser in the Last Year: Never true   Arboriculturist in the Last Year: Never true  Transportation Needs: No Transportation Needs   Lack of Transportation (Medical): No   Lack of Transportation (Non-Medical): No  Physical Activity: Insufficiently Active   Days of Exercise per Week: 4 days   Minutes of Exercise per Session: 30 min  Stress: No Stress Concern Present   Feeling of Stress : Not at all  Social Connections: Moderately Isolated   Frequency of Communication with Friends and Family: More than three times a week   Frequency of  Social Gatherings with Friends and Family: More than three times a week   Attends Religious Services: More than 4 times per year   Active Member of [employer] or Organizations: No   Attends Archivist Meetings: Never   Marital Status: Widowed  Intimate Partner Violence: Not At Risk   Fear of Current or Ex-Partner: No   Emotionally Abused: No   Physically Abused: No   Sexually Abused: No    Past Surgical History:  Procedure Laterality Date   DENTAL SURGERY  09/2014   ROTATOR CUFF REPAIR Right    TONSILLECTOMY      Family History  Problem Relation Age of Onset   Inflammatory bowel disease Sister    Alzheimer's disease Sister    Pancreatic cancer Maternal Grandfather    Alzheimer's disease Mother    Hyperlipidemia Son    Obesity Son    Hyperlipidemia Son    Heart  disease Paternal Uncle    Coronary artery disease Neg Hx     Allergies  Allergen Reactions   Codeine     REACTION: Nausea  Vomiting    Current Outpatient Medications on File Prior to Visit  Medication Sig Dispense Refill   albuterol (VENTOLIN HFA) 108 (90 Base) MCG/ACT inhaler Inhale 2 puffs into the lungs every 6 (six) hours as needed for wheezing or shortness of breath. 8 g 0   Ascorbic Acid (VITAMIN C) 1000 MG tablet Take 1,000 mg by mouth daily.     Calcium Carbonate-Vitamin D (CALCIUM PLUS VITAMIN D PO) Take 1,200 tablets by mouth daily.     fish oil-omega-3 fatty acids 1000 MG capsule Take 2 g by mouth daily.     Glucosamine-Chondroit-Vit C-Mn (GLUCOSAMINE 1500 COMPLEX) CAPS Take 1,500 capsules by mouth 2 (two) times daily.     losartan (COZAAR) 25 MG tablet TAKE 1/2 TO 1 TABLET BY  MOUTH DAILY 90 tablet 3   Multiple Vitamin (MULTIVITAMIN) tablet Take 1 tablet by mouth daily.     neomycin-polymyxin-hydrocortisone (CORTISPORIN) OTIC solution Place 4 drops into the right ear 3 (three) times daily. 10 mL 0   simvastatin (ZOCOR) 10 MG tablet TAKE 1 TABLET BY MOUTH AT  BEDTIME 90 tablet 3   No current  facility-administered medications on file prior to visit.    BP (!) 150/88 Comment: i checked manually espac.  Pulse 84   Temp 98 F (36.7 C) (Oral)   Resp 18   Ht '5\' 3"'$  (1.6 m)   Wt 118 lb 9.6 oz (53.8 kg)   SpO2 98%   BMI 21.01 kg/m        Objective:   Physical Exam  General Mental Status- Alert. General Appearance- Not in acute distress.   Skin General: Color- Normal Color. Moisture- Normal Moisture.  Neck Carotid Arteries- Normal color. Moisture- Normal Moisture. No carotid bruits. No JVD.  Chest and Lung Exam Auscultation: Breath Sounds:-Normal.  Cardiovascular Auscultation:Rythm- Regular. Murmurs & Other Heart Sounds:Auscultation of the heart reveals- No Murmurs.  Abdomen Inspection:-Inspeection Normal. Palpation/Percussion:Note:No mass. Palpation and Percussion of the abdomen reveal- Non Tender, Non Distended + BS, no rebound or guarding.    Neurologic Cranial Nerve exam:- CN III-XII intact(No nystagmus), symmetric smile. Drift Test:- No drift. Finger to Nose:- Normal/Intact Strength:- 5/5 equal and symmetric strength both upper and lower extremities. (Transient dizziness during lavage. MA stopped. Dizziness resolved)  Heent- rt canal clear except for flaky minmal wax. Can see tm. Left canal- moderate wax obsutrction prelavage. Post lavage wax still present. Could not see tm.      Assessment & Plan:   History of hypertension with recent spike in blood pressure on Monday when seen in the emergency department.  Today initial blood pressure reading done by MA was close to ideal.  On second check by myself manually blood pressure was 150/88.  Recommend checking blood pressure daily over the next week.  If blood pressure levels are over 140/90 then go ahead and increase losartan to 50 mg / 2 of the 25 mg tablets.    If any recurrent severe spike as before or any cardiac or neurologic signs or symptoms and recommend ED evaluation.  For cerumen impaction we  did ear lavage today.  Follow up in 7 days recheck bp or sooner if needed  Mackie Pai, PA-C   Time spent with patient today was 43  minutes which consisted of chart revdiew, discussing diagnosis, work up treatment and documentation.

## 2020-10-31 ENCOUNTER — Telehealth: Payer: Self-pay | Admitting: Family Medicine

## 2020-10-31 NOTE — Telephone Encounter (Signed)
Please advise if okay?

## 2020-10-31 NOTE — Telephone Encounter (Signed)
Please see message below

## 2020-10-31 NOTE — Telephone Encounter (Signed)
Yes. She was approved in May for transfer.

## 2020-10-31 NOTE — Telephone Encounter (Signed)
Patient requesting to transfer care from Dr. Charlett Blake to Dr. Raoul Pitch due to scheduling difficulties at Dr. Frederik Pear office. Please advise if ok to transfer patient.

## 2020-11-07 ENCOUNTER — Encounter: Payer: Self-pay | Admitting: Medical

## 2020-11-07 ENCOUNTER — Ambulatory Visit (INDEPENDENT_AMBULATORY_CARE_PROVIDER_SITE_OTHER): Payer: Medicare Other | Admitting: Medical

## 2020-11-07 ENCOUNTER — Other Ambulatory Visit: Payer: Self-pay

## 2020-11-07 VITALS — BP 118/70 | HR 94 | Resp 18 | Ht 63.0 in | Wt 118.2 lb

## 2020-11-07 DIAGNOSIS — R413 Other amnesia: Secondary | ICD-10-CM

## 2020-11-07 DIAGNOSIS — R739 Hyperglycemia, unspecified: Secondary | ICD-10-CM

## 2020-11-07 DIAGNOSIS — I10 Essential (primary) hypertension: Secondary | ICD-10-CM

## 2020-11-07 DIAGNOSIS — R5383 Other fatigue: Secondary | ICD-10-CM

## 2020-11-07 NOTE — Patient Instructions (Addendum)
You blood pressure is very good today. I want you to get new bp machine and check bp daily. Continue losartan daily.  For elevated blood sugar get a1c today.  For fatigue will get cbc, cmp, tsh, t4 and iron level.  You do appear to have some dizziness associated with changes positions. I want you to monitor if occurring on standing. Get balance before ambulating and use walker.   For wax obstruction left side we attempted lavage.  Follow up in 2-3 weeks or sooner if needed.

## 2020-11-07 NOTE — Progress Notes (Signed)
Subjective:    Patient ID: Christine Henderson, female    DOB: Jan 02, 1931, 87 y.o.   MRN: [id number]  HPI  Pt in today with son. Medical power of attorney.  Pt states she feels weak. Pt does use walker. Usually after sitting a while  Pt has hx of htn. She has 87 year old. She has bp that vary at home. Various visit over months per son with high blood pressure and feeling fatigued.  At home [id number] systolic. Majority of numerous readings [id number] systolic and [id number] diastolic.    I saw pt after her last ED evaluation. She had negative CT head at that was negative.   Last week bp readings per pt. 124/91, 150/8, 133/86, 115/73, 139/89, 125/84, 125/85, 137/90,141/94, 135/94 and 134/87.   Hx of wax obstruction. Failed lavage on last visit.   Son has concerns that pt is forgetful.   Pt thinks statin might be causing dizziness.   Son expresses that mom is forgetting persons names. Pt remembers what she had for breakfast. Pt aware of person, place and time.  3 item recall good.   Review of Systems  Constitutional:  Positive for fatigue. Negative for chills and fever.  HENT:  Negative for dental problem.   Respiratory:  Negative for cough, chest tightness, shortness of breath and wheezing.   Cardiovascular:  Negative for chest pain and palpitations.  Gastrointestinal:  Negative for abdominal pain and blood in stool.  Genitourinary:  Negative for dysuria, frequency, hematuria and vaginal pain.       No  hx of uti per pt. No symptoms.  Musculoskeletal:  Negative for back pain.  Skin:  Negative for rash.  Hematological:  Negative for adenopathy. Does not bruise/bleed easily.  Psychiatric/Behavioral:  Negative for behavioral problems and confusion.     Past Medical History:  Diagnosis Date   Abdominal pain, unspecified site 06/26/2008   ALLERGIC RHINITIS CAUSE UNSPECIFIED 02/02/2008   Annual physical exam 12/26/2012   Diarrhea 06/26/2008   Fibrocystic breast disease    Headache(784.0)  10/01/2006   Hereditary and idiopathic peripheral neuropathy 01/10/2016   HYPERLIPIDEMIA 10/01/2006   HYPERTENSION 10/06/2007   IBS (irritable bowel syndrome)    Insomnia 11/14/2012   Irritable bowel syndrome 09/10/2006   Medicare annual wellness visit, subsequent 12/26/2012   Sees Dr Elinor Parkinson of opthamology.  MGM 5/14      Metatarsal stress fracture 01/20/2016   MIGRAINE HEADACHE 09/10/2006   MVP (mitral valve prolapse)    Onychomycosis 07/09/2014   OSTEOPENIA 09/10/2006   Osteopenia 11/12/2012   Palpitations 09/10/2006   PHOTOPHOBIA 07/20/2009   SKIN TAG 11/09/2006   VAGINITIS, ATROPHIC 10/01/2006   Vitamin D deficiency 07/14/2016     Social History   Socioeconomic History   Marital status: Widowed    Spouse name: Not on file   Number of children: 4   Years of education: Not on file   Highest education level: Not on file  Occupational History   Occupation: retired    Fish farm manager: RETIRED  Tobacco Use   Smoking status: Never   Smokeless tobacco: Never  Substance and Sexual Activity   Alcohol use: No   Drug use: No   Sexual activity: Never    Birth control/protection: None    Comment: regular exercise, lives at the [place], widowed in 2014  Other Topics Concern   Not on file  Social History Narrative   Not on file   Social Determinants of Health   Financial Resource Strain: Low  Risk    Difficulty of Paying Living Expenses: Not hard at all  Food Insecurity: No Food Insecurity   Worried About [place] in the Last Year: Never true   Ran Out of Food in the Last Year: Never true  Transportation Needs: No Transportation Needs   Lack of Transportation (Medical): No   Lack of Transportation (Non-Medical): No  Physical Activity: Insufficiently Active   Days of Exercise per Week: 4 days   Minutes of Exercise per Session: 30 min  Stress: No Stress Concern Present   Feeling of Stress : Not at all  Social Connections: Moderately Isolated   Frequency of Communication  with Friends and Family: More than three times a week   Frequency of Social Gatherings with Friends and Family: More than three times a week   Attends Religious Services: More than 4 times per year   Active Member of [employer] or Organizations: No   Attends Archivist Meetings: Never   Marital Status: Widowed  Human resources officer Violence: Not At Risk   Fear of Current or Ex-Partner: No   Emotionally Abused: No   Physically Abused: No   Sexually Abused: No    Past Surgical History:  Procedure Laterality Date   DENTAL SURGERY  09/2014   ROTATOR CUFF REPAIR Right    TONSILLECTOMY      Family History  Problem Relation Age of Onset   Inflammatory bowel disease Sister    Alzheimer's disease Sister    Pancreatic cancer Maternal Grandfather    Alzheimer's disease Mother    Hyperlipidemia Son    Obesity Son    Hyperlipidemia Son    Heart disease Paternal Uncle    Coronary artery disease Neg Hx     Allergies  Allergen Reactions   Codeine     REACTION: Nausea  Vomiting    Current Outpatient Medications on File Prior to Visit  Medication Sig Dispense Refill   albuterol (VENTOLIN HFA) 108 (90 Base) MCG/ACT inhaler Inhale 2 puffs into the lungs every 6 (six) hours as needed for wheezing or shortness of breath. 8 g 0   Ascorbic Acid (VITAMIN C) 1000 MG tablet Take 1,000 mg by mouth daily.     Calcium Carbonate-Vitamin D (CALCIUM PLUS VITAMIN D PO) Take 1,200 tablets by mouth daily.     fish oil-omega-3 fatty acids 1000 MG capsule Take 2 g by mouth daily.     Glucosamine-Chondroit-Vit C-Mn (GLUCOSAMINE 1500 COMPLEX) CAPS Take 1,500 capsules by mouth 2 (two) times daily.     losartan (COZAAR) 25 MG tablet TAKE 1/2 TO 1 TABLET BY  MOUTH DAILY 90 tablet 3   Multiple Vitamin (MULTIVITAMIN) tablet Take 1 tablet by mouth daily.     neomycin-polymyxin-hydrocortisone (CORTISPORIN) OTIC solution Place 4 drops into the right ear 3 (three) times daily. 10 mL 0   simvastatin (ZOCOR) 10 MG  tablet TAKE 1 TABLET BY MOUTH AT  BEDTIME 90 tablet 3   No current facility-administered medications on file prior to visit.    BP 118/70   Pulse 94   Resp 18   Ht '5\' 3"'$  (1.6 m)   Wt 118 lb 3.2 oz (53.6 kg)   SpO2 93%   BMI 20.94 kg/m       Objective:   Physical Exam  General Mental Status- Alert. General Appearance- Not in acute distress.   Skin General: Color- Normal Color. Moisture- Normal Moisture.  Neck Carotid Arteries- Normal color. Moisture- Normal Moisture. No carotid bruits. No  JVD.  Chest and Lung Exam Auscultation: Breath Sounds:-Normal.  Cardiovascular Auscultation:Rythm- Regular. Murmurs & Other Heart Sounds:Auscultation of the heart reveals- No Murmurs.  Abdomen Inspection:-Inspeection Normal. Palpation/Percussion:Note:No mass. Palpation and Percussion of the abdomen reveal- Non Tender, Non Distended + BS, no rebound or guarding.   Neurologic Cranial Nerve exam:- CN III-XII intact(No nystagmus), symmetric smile. Drift Test:- No drift. Finger to Nose:- Normal/Intact Strength:- 5/5 equal and symmetric strength both upper and lower extremities.    Back- no cva tenderness.     Assessment & Plan:   Patient Instructions  You blood pressure is very good today. I want you to get new bp machine and check bp daily. Continue losartan daily.  For elevated blood sugar get a1c today.  For fatigue will get cbc, cmp, tsh, t4 and iron level.  You do appear to have some dizziness associated with changes positions. I want you to monitor if occurring on standing. Get balance before ambulating and use walker.   For wax obstruction left side we attempted lavage.  Follow up in 2-3 weeks or sooner if needed.   Mackie Pai, PA-C   Time spent with patient today was  42 minutes which consisted of chart review, discussing diagnosis, work up treatment and documentation.

## 2020-11-08 LAB — CBC WITH DIFFERENTIAL/PLATELET
Basophils Absolute: 0.1 10*3/uL (ref 0.0–0.1)
Basophils Relative: 1.3 % (ref 0.0–3.0)
Eosinophils Absolute: 0.1 10*3/uL (ref 0.0–0.7)
Eosinophils Relative: 1.8 % (ref 0.0–5.0)
HCT: 39.1 % (ref 36.0–46.0)
Hemoglobin: 12.8 g/dL (ref 12.0–15.0)
Lymphocytes Relative: 20.8 % (ref 12.0–46.0)
Lymphs Abs: 1.5 10*3/uL (ref 0.7–4.0)
MCHC: 32.7 g/dL (ref 30.0–36.0)
MCV: 96.6 fl (ref 78.0–100.0)
Monocytes Absolute: 0.5 10*3/uL (ref 0.1–1.0)
Monocytes Relative: 7.2 % (ref 3.0–12.0)
Neutro Abs: 5 10*3/uL (ref 1.4–7.7)
Neutrophils Relative %: 68.9 % (ref 43.0–77.0)
Platelets: 265 10*3/uL (ref 150.0–400.0)
RBC: 4.05 Mil/uL (ref 3.87–5.11)
RDW: 14.8 % (ref 11.5–15.5)
WBC: 7.3 10*3/uL (ref 4.0–10.5)

## 2020-11-08 LAB — COMPREHENSIVE METABOLIC PANEL
ALT: 15 U/L (ref 0–35)
AST: 24 U/L (ref 0–37)
Albumin: 4.3 g/dL (ref 3.5–5.2)
Alkaline Phosphatase: 36 U/L — ABNORMAL LOW (ref 39–117)
BUN: 34 mg/dL — ABNORMAL HIGH (ref 6–23)
CO2: 29 mEq/L (ref 19–32)
Calcium: 10.1 mg/dL (ref 8.4–10.5)
Chloride: 102 mEq/L (ref 96–112)
Creatinine, Ser: 1.04 mg/dL (ref 0.40–1.20)
GFR: 47.94 mL/min — ABNORMAL LOW (ref >60.00)
Glucose, Bld: 104 mg/dL — ABNORMAL HIGH (ref 70–99)
Potassium: 4.7 mEq/L (ref 3.5–5.1)
Sodium: 140 mEq/L (ref 135–145)
Total Bilirubin: 0.9 mg/dL (ref 0.2–1.2)
Total Protein: 7.3 g/dL (ref 6.0–8.3)

## 2020-11-08 LAB — T4, FREE: Free T4: 0.98 ng/dL (ref 0.60–1.60)

## 2020-11-08 LAB — HEMOGLOBIN A1C: Hgb A1c MFr Bld: 6.2 % (ref 4.6–6.5)

## 2020-11-08 LAB — TSH: TSH: 2.31 u[IU]/mL (ref 0.35–5.50)

## 2020-11-08 LAB — IRON: Iron: 81 ug/dL (ref 42–145)

## 2020-11-14 ENCOUNTER — Encounter: Payer: Self-pay | Admitting: Family Medicine

## 2020-11-15 ENCOUNTER — Other Ambulatory Visit: Payer: Self-pay | Admitting: Family Medicine

## 2020-11-15 DIAGNOSIS — R42 Dizziness and giddiness: Secondary | ICD-10-CM

## 2020-11-15 DIAGNOSIS — T7840XD Allergy, unspecified, subsequent encounter: Secondary | ICD-10-CM

## 2020-11-15 DIAGNOSIS — H6123 Impacted cerumen, bilateral: Secondary | ICD-10-CM

## 2020-11-23 ENCOUNTER — Ambulatory Visit (INDEPENDENT_AMBULATORY_CARE_PROVIDER_SITE_OTHER): Payer: Medicare Other | Admitting: Medical

## 2020-11-23 ENCOUNTER — Other Ambulatory Visit: Payer: Self-pay

## 2020-11-23 VITALS — BP 139/82 | HR 86 | Resp 18 | Ht 63.0 in | Wt 117.4 lb

## 2020-11-23 DIAGNOSIS — R42 Dizziness and giddiness: Secondary | ICD-10-CM

## 2020-11-23 DIAGNOSIS — R739 Hyperglycemia, unspecified: Secondary | ICD-10-CM | POA: Diagnosis not present

## 2020-11-23 DIAGNOSIS — I10 Essential (primary) hypertension: Secondary | ICD-10-CM

## 2020-11-23 NOTE — Patient Instructions (Signed)
History of hypertension and recent checks since last visit with new machine so majority of blood pressure readings are in tightly controlled range.  Occasional mild systolic elevation and 1 day blood pressure was abnormally low but that was 2 weeks ago.  Today at home blood pressure 139/82.  Some high blood pressures here but that does appear to be a whitecoat hypertension like as repeat blood pressure checks today showed gradual decrease in blood pressure.  Continue losartan 25 mg daily.  Prior labs done on past visit recently were all negative not showing any obvious etiology for fatigue.  Blood sugar level was mild elevated but A1c was not in diabetic range.  Some dizziness described 2 weeks ago but now that has resolved.  No motor or sensory function deficits presently.  On last visit had discussed for you do want to make sure not having postural low blood pressure changes.  Make sure when you change position that you get your balance if you are feeling dizzy.  If you do have dizziness with any gross motor or sensory function deficits/neurologic type symptoms then be seen in the emergency department.  Keep appointment with ENT for next Friday.  Follow-up in 2 to 3 months with or sooner if needed.  Please go ahead and try to schedule that follow-up appointment.

## 2020-11-23 NOTE — Progress Notes (Signed)
Subjective:    Patient ID: Christine Henderson, female    DOB: 14-Sep-1930, 87 y.o.   MRN: [id number]  HPI Pt in for follow up. She got new bp machine. Hx of htn. Has bp readings for past 10 days.   Pt has checked bp numerous times daily. Majority are in the 120-130/70-80 range. One day had low 93/63. That low reading was 2 weeks.   Pt also notes 2 weeks ago felt dizzy but this week that is resolving. Did not have vertigo. Just states felt off balance.   On last visit we did discuss her having some dizziness with changes of position. But 2 weeks ago she did not report that to be the case.   Pt son 2 weeks ago was trying to get in with ENT. She does have appt next Friday. Dr. Yancey Flemings put in that referral.  On last visit had some fatigue as well as mentioned prior dizziness on position change so labs done.   Pt indicates hx of some white coat htn. Her bp today at home with new machine earlier 139/82.   Review of Systems  Constitutional:  Negative for fatigue and fever.  HENT:  Negative for congestion, drooling and ear pain.   Respiratory:  Negative for cough, shortness of breath and wheezing.   Cardiovascular:  Negative for chest pain and palpitations.  Gastrointestinal:  Negative for abdominal pain, constipation and nausea.  Genitourinary:  Negative for dyspareunia, dysuria, hematuria, menstrual problem and urgency.  Musculoskeletal:  Negative for back pain.  Skin:  Negative for rash.  Neurological:  Negative for numbness.       No dizziness prsently. About one week ago dizziness that started to subside. Now resolved.     Past Medical History:  Diagnosis Date   Abdominal pain, unspecified site 06/26/2008   ALLERGIC RHINITIS CAUSE UNSPECIFIED 02/02/2008   Annual physical exam 12/26/2012   Diarrhea 06/26/2008   Fibrocystic breast disease    Headache(784.0) 10/01/2006   Hereditary and idiopathic peripheral neuropathy 01/10/2016   HYPERLIPIDEMIA 10/01/2006   HYPERTENSION 10/06/2007   IBS  (irritable bowel syndrome)    Insomnia 11/14/2012   Irritable bowel syndrome 09/10/2006   Medicare annual wellness visit, subsequent 12/26/2012   Sees Dr Elinor Parkinson of opthamology.  MGM 5/14      Metatarsal stress fracture 01/20/2016   MIGRAINE HEADACHE 09/10/2006   MVP (mitral valve prolapse)    Onychomycosis 07/09/2014   OSTEOPENIA 09/10/2006   Osteopenia 11/12/2012   Palpitations 09/10/2006   PHOTOPHOBIA 07/20/2009   SKIN TAG 11/09/2006   VAGINITIS, ATROPHIC 10/01/2006   Vitamin D deficiency 07/14/2016     Social History   Socioeconomic History   Marital status: Widowed    Spouse name: Not on file   Number of children: 4   Years of education: Not on file   Highest education level: Not on file  Occupational History   Occupation: retired    Fish farm manager: RETIRED  Tobacco Use   Smoking status: Never   Smokeless tobacco: Never  Substance and Sexual Activity   Alcohol use: No   Drug use: No   Sexual activity: Never    Birth control/protection: None    Comment: regular exercise, lives at the [place], widowed in 2014  Other Topics Concern   Not on file  Social History Narrative   Not on file   Social Determinants of Health   Financial Resource Strain: Low Risk    Difficulty of Paying Living Expenses: Not hard at  all  Food Insecurity: No Food Insecurity   Worried About Charity fundraiser in the Last Year: Never true   Ran Out of Food in the Last Year: Never true  Transportation Needs: No Transportation Needs   Lack of Transportation (Medical): No   Lack of Transportation (Non-Medical): No  Physical Activity: Insufficiently Active   Days of Exercise per Week: 4 days   Minutes of Exercise per Session: 30 min  Stress: No Stress Concern Present   Feeling of Stress : Not at all  Social Connections: Moderately Isolated   Frequency of Communication with Friends and Family: More than three times a week   Frequency of Social Gatherings with Friends and Family: More than three  times a week   Attends Religious Services: More than 4 times per year   Active Member of [employer] or Organizations: No   Attends Archivist Meetings: Never   Marital Status: Widowed  Human resources officer Violence: Not At Risk   Fear of Current or Ex-Partner: No   Emotionally Abused: No   Physically Abused: No   Sexually Abused: No    Past Surgical History:  Procedure Laterality Date   DENTAL SURGERY  09/2014   ROTATOR CUFF REPAIR Right    TONSILLECTOMY      Family History  Problem Relation Age of Onset   Inflammatory bowel disease Sister    Alzheimer's disease Sister    Pancreatic cancer Maternal Grandfather    Alzheimer's disease Mother    Hyperlipidemia Son    Obesity Son    Hyperlipidemia Son    Heart disease Paternal Uncle    Coronary artery disease Neg Hx     Allergies  Allergen Reactions   Codeine     REACTION: Nausea  Vomiting    Current Outpatient Medications on File Prior to Visit  Medication Sig Dispense Refill   albuterol (VENTOLIN HFA) 108 (90 Base) MCG/ACT inhaler Inhale 2 puffs into the lungs every 6 (six) hours as needed for wheezing or shortness of breath. 8 g 0   Ascorbic Acid (VITAMIN C) 1000 MG tablet Take 1,000 mg by mouth daily.     Calcium Carbonate-Vitamin D (CALCIUM PLUS VITAMIN D PO) Take 1,200 tablets by mouth daily.     fish oil-omega-3 fatty acids 1000 MG capsule Take 2 g by mouth daily.     Glucosamine-Chondroit-Vit C-Mn (GLUCOSAMINE 1500 COMPLEX) CAPS Take 1,500 capsules by mouth 2 (two) times daily.     losartan (COZAAR) 25 MG tablet TAKE 1/2 TO 1 TABLET BY  MOUTH DAILY 90 tablet 3   Multiple Vitamin (MULTIVITAMIN) tablet Take 1 tablet by mouth daily.     neomycin-polymyxin-hydrocortisone (CORTISPORIN) OTIC solution Place 4 drops into the right ear 3 (three) times daily. 10 mL 0   simvastatin (ZOCOR) 10 MG tablet TAKE 1 TABLET BY MOUTH AT  BEDTIME 90 tablet 3   No current facility-administered medications on file prior to visit.     BP (!) 170/90   Pulse 86   Resp 18   Ht 5\' 3"  (1.6 m)   Wt 117 lb 6.4 oz (53.3 kg)   SpO2 96%   BMI 20.80 kg/m       Objective:   Physical Exam  General Mental Status- Alert. General Appearance- Not in acute distress.   Skin General: Color- Normal Color. Moisture- Normal Moisture.  Neck Carotid Arteries- Normal color. Moisture- Normal Moisture. No carotid bruits. No JVD.  Chest and Lung Exam Auscultation: Breath Sounds:-Normal.  Cardiovascular  Auscultation:Rythm- Regular. Murmurs & Other Heart Sounds:Auscultation of the heart reveals- No Murmurs.  Abdomen Inspection:-Inspeection Normal. Palpation/Percussion:Note:No mass. Palpation and Percussion of the abdomen reveal- Non Tender, Non Distended + BS, no rebound or guarding.    Neurologic Cranial Nerve exam:- CN III-XII intact(No nystagmus), symmetric smile. Strength:- 5/5 equal and symmetric strength both upper and lower extremities.   Heent-canal mosty clear today. Tm are normal.    Assessment & Plan:   Patient Instructions  History of hypertension and recent checks since last visit with new machine so majority of blood pressure readings are in tightly controlled range.  Occasional mild systolic elevation and 1 day blood pressure was abnormally low but that was 2 weeks ago.  Today at home blood pressure 139/82.  Some high blood pressures here but that does appear to be a whitecoat hypertension like as repeat blood pressure checks today showed gradual decrease in blood pressure.  Continue losartan 25 mg daily.  Prior labs done on past visit recently were all negative not showing any obvious etiology for fatigue.  Blood sugar level was mild elevated but A1c was not in diabetic range.  Some dizziness described 2 weeks ago but now that has resolved.  No motor or sensory function deficits presently.  On last visit had discussed for you do want to make sure not having postural low blood pressure changes.  Make sure  when you change position that you get your balance if you are feeling dizzy.  If you do have dizziness with any gross motor or sensory function deficits/neurologic type symptoms then be seen in the emergency department.  Keep appointment with ENT for next Friday.  Follow-up in 2 to 3 months with or sooner if needed.  Please go ahead and try to schedule that follow-up appointment.   Mackie Pai, PA-C

## 2020-11-30 DIAGNOSIS — R2681 Unsteadiness on feet: Secondary | ICD-10-CM | POA: Diagnosis not present

## 2020-12-25 ENCOUNTER — Ambulatory Visit: Payer: Medicare Other | Admitting: Neurology

## 2021-04-02 DIAGNOSIS — H52203 Unspecified astigmatism, bilateral: Secondary | ICD-10-CM | POA: Diagnosis not present

## 2021-04-02 DIAGNOSIS — H26491 Other secondary cataract, right eye: Secondary | ICD-10-CM | POA: Diagnosis not present

## 2021-04-02 DIAGNOSIS — D3131 Benign neoplasm of right choroid: Secondary | ICD-10-CM | POA: Diagnosis not present

## 2021-06-25 ENCOUNTER — Telehealth: Payer: Self-pay | Admitting: Family Medicine

## 2021-06-25 DIAGNOSIS — Z1231 Encounter for screening mammogram for malignant neoplasm of breast: Secondary | ICD-10-CM | POA: Diagnosis not present

## 2021-06-25 LAB — HM MAMMOGRAPHY: HM Mammogram: NORMAL (ref 0–4)

## 2021-06-25 NOTE — Telephone Encounter (Signed)
Mammogram ordered for [facility]. ?

## 2021-06-25 NOTE — Telephone Encounter (Signed)
Pt would like recommendations on places for referrals for mammograms.  ? ?Please advise.  ?

## 2021-08-26 ENCOUNTER — Other Ambulatory Visit: Payer: Self-pay | Admitting: Family Medicine

## 2021-08-26 DIAGNOSIS — I10 Essential (primary) hypertension: Secondary | ICD-10-CM

## 2021-09-12 IMAGING — CT CT HEAD W/O CM
4 series · 16 of 47 positions shown, 18 images · non-contrast
Comparison: 07/24/2020

CLINICAL DATA: Mental status change.  Hypertensive.

EXAM:
CT HEAD WITHOUT CONTRAST
TECHNIQUE: Contiguous axial images were obtained from the base of the skull
through the vertex without intravenous contrast.

[Series 3: head without · axial · non-contrast · 0.39mm/px · z∈[+1287,+1392]mm · 7 of 29 slices shown, 9 images]
[im 4/29  brain]
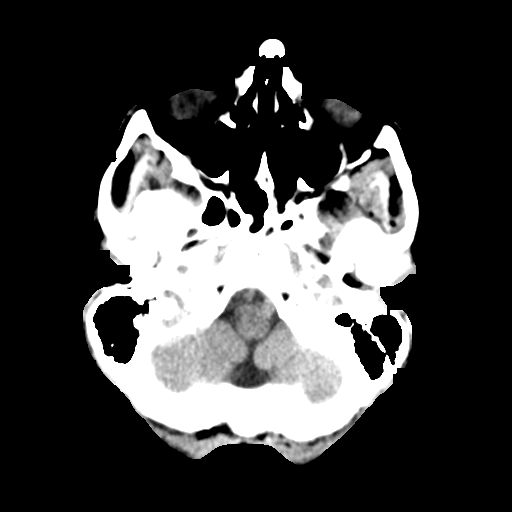
[im 4/29  bone]
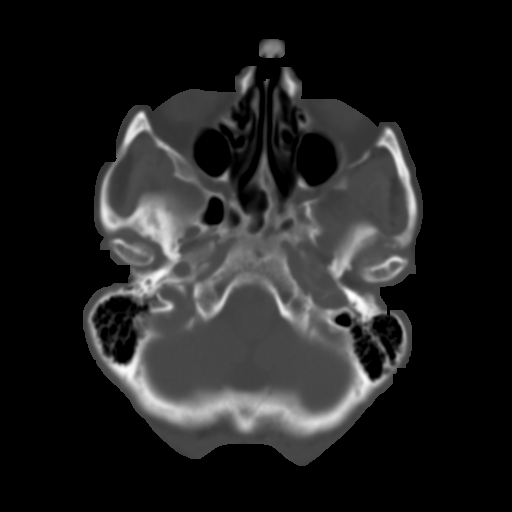
[im 8/29  brain]
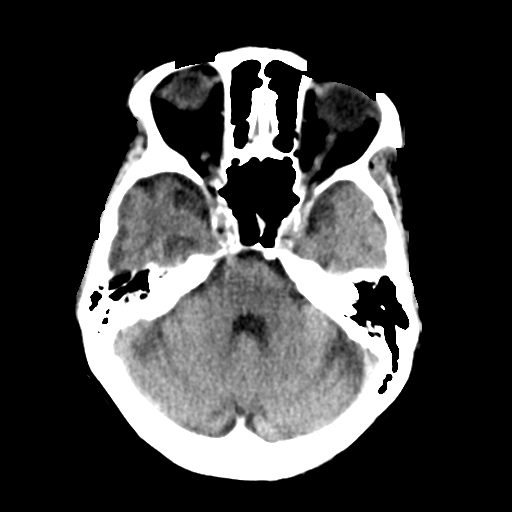
[im 11/29  brain]
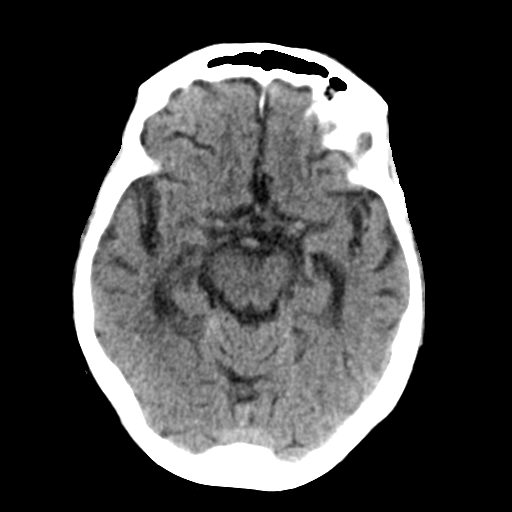
[im 15/29  brain]
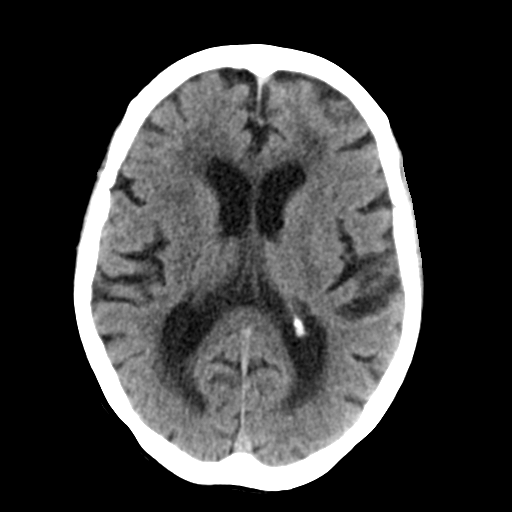
[im 18/29  brain]
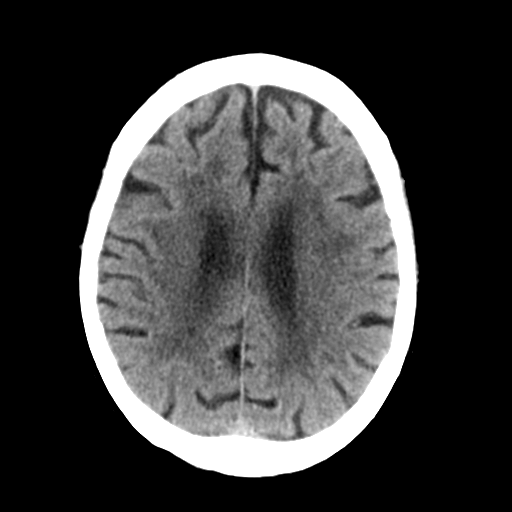
[im 18/29  bone]
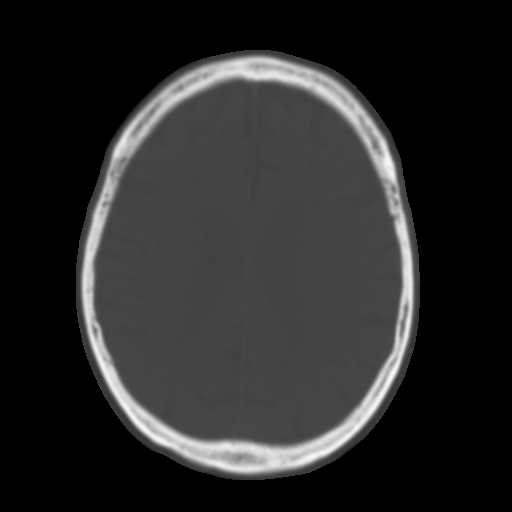
[im 22/29  brain]
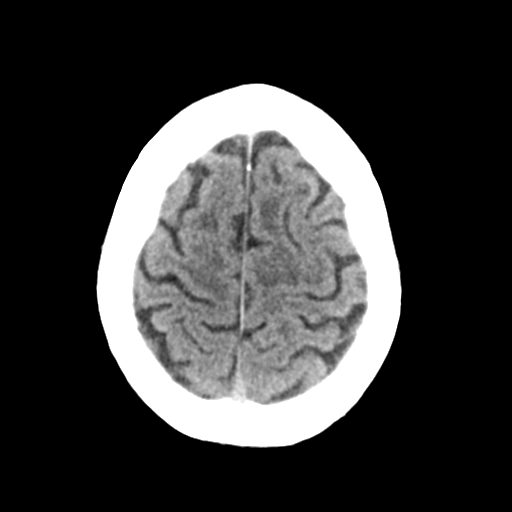
[im 25/29  brain]
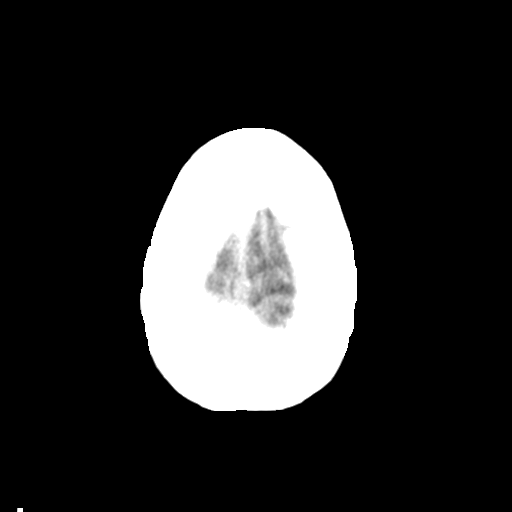

[Series 4: head bone · axial · 0.39mm/px · z∈[+1286,+1314]mm · 3 of 71 slices shown]
[im 8/71  bone]
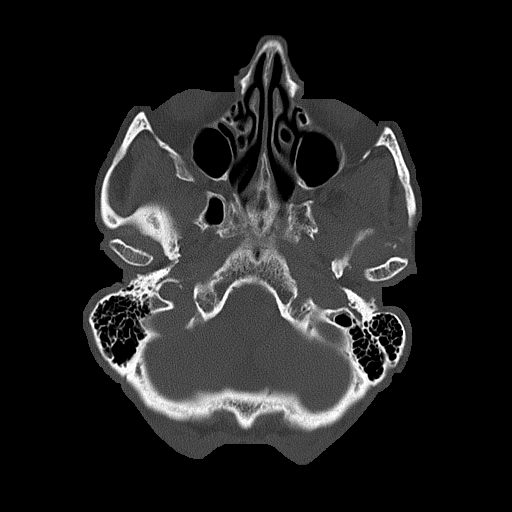
[im 15/71  bone]
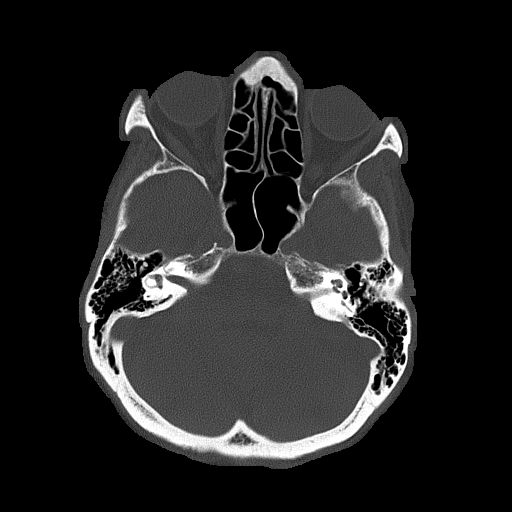
[im 22/71  bone]
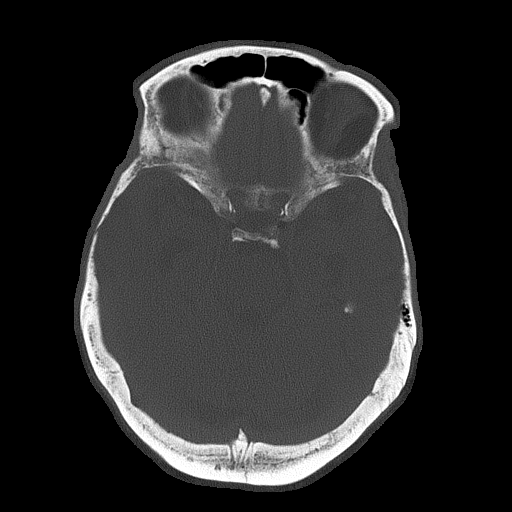

[Series 5: head without cor · coronal · non-contrast · 0.27mm/px · 3 of 67 slices shown]
[im 23/67  brain]
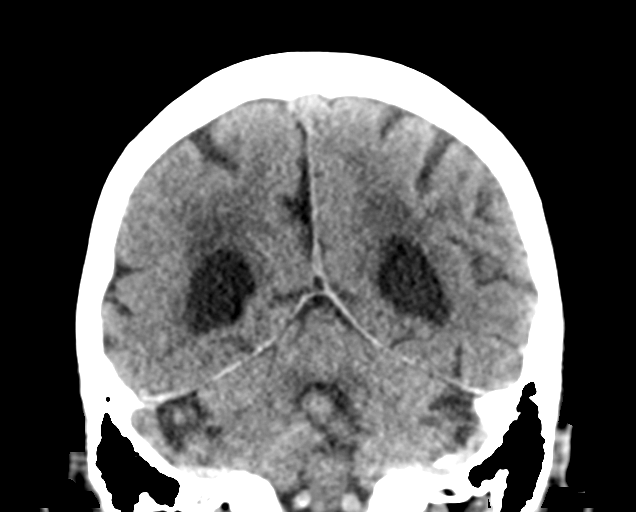
[im 30/67  brain]
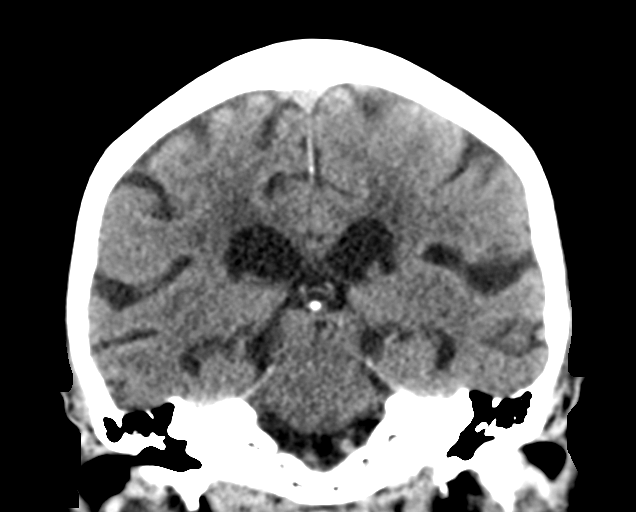
[im 37/67  brain]
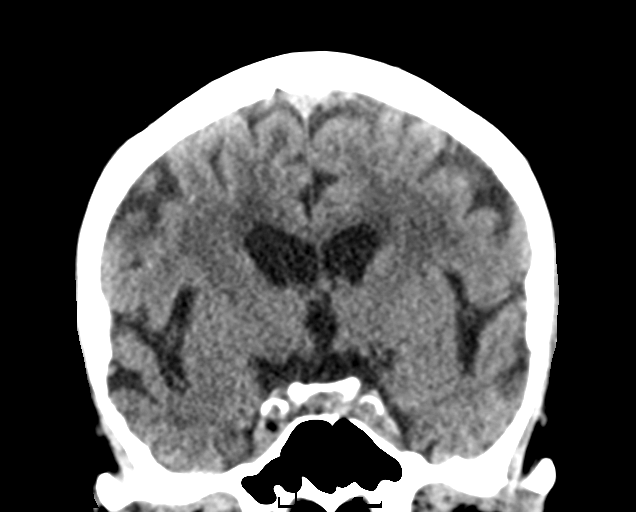

[Series 6: head without sag · sagittal · non-contrast · 0.27mm/px · 3 of 56 slices shown]
[im 19/56  brain]
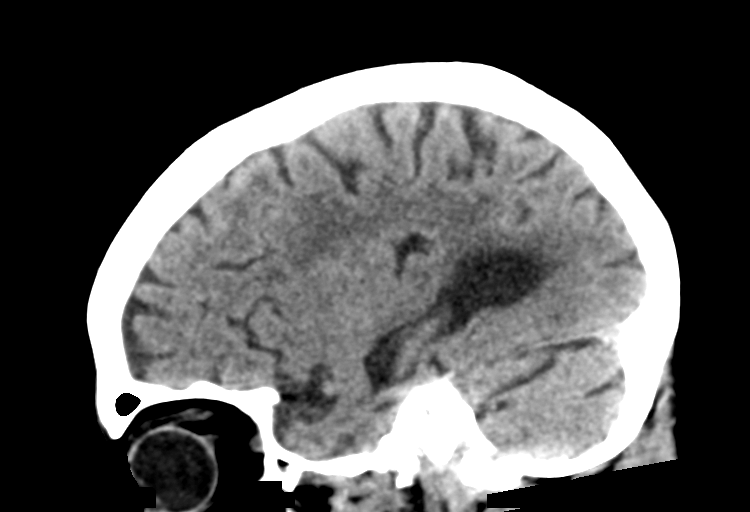
[im 28/56  brain]
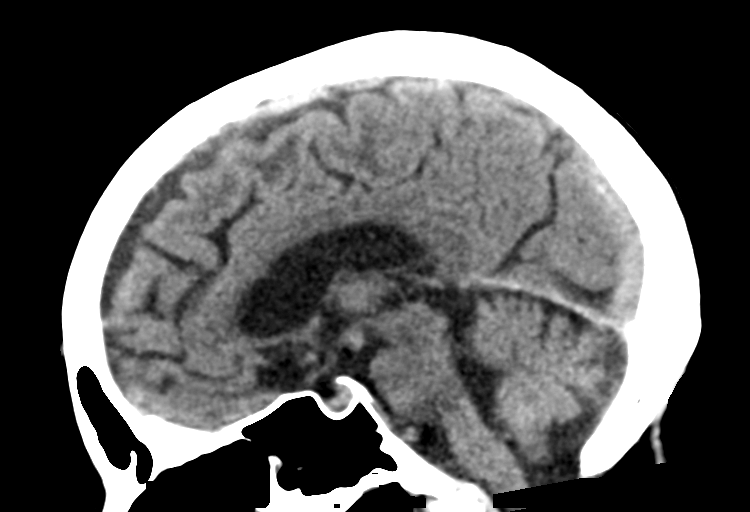
[im 37/56  brain]
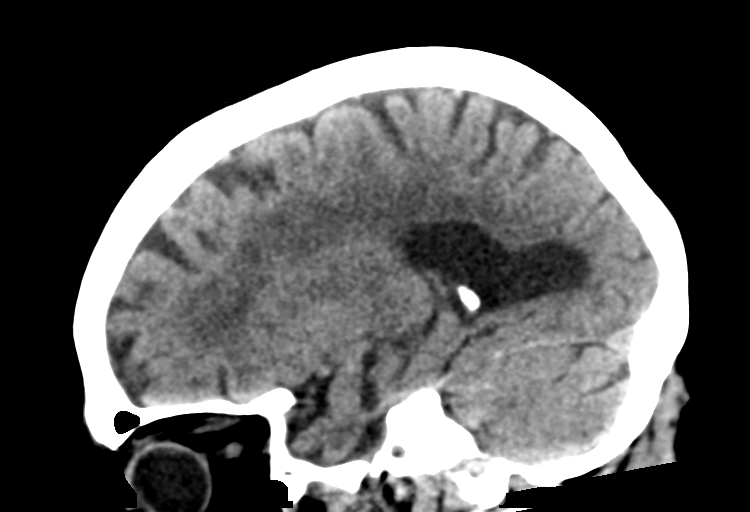

[16 of 47 positions shown; findings below may reference images not displayed]

FINDINGS: Brain: No evidence of acute infarction, hemorrhage, hydrocephalus,
extra-axial collection or mass lesion/mass effect. Diffuse cerebral
atrophy. Ventricular dilatation consistent with central atrophy.
Patchy low-attenuation changes throughout the deep white matter
consistent with small vessel ischemia.

Vascular: Moderate intracranial arterial vascular calcifications.

Skull: The calvarium appears intact.

Sinuses/Orbits: Paranasal sinuses and mastoid air cells are clear.

Other: None.
IMPRESSION: No acute intracranial abnormalities. Chronic atrophy and small
vessel ischemic changes.

## 2021-10-09 ENCOUNTER — Encounter: Payer: Self-pay | Admitting: Family Medicine

## 2021-10-24 ENCOUNTER — Ambulatory Visit (INDEPENDENT_AMBULATORY_CARE_PROVIDER_SITE_OTHER): Payer: Medicare Other | Admitting: Family Medicine

## 2021-10-24 VITALS — BP 128/80 | HR 62 | Temp 98.0°F | Resp 16 | Ht 64.0 in | Wt 120.4 lb

## 2021-10-24 DIAGNOSIS — R413 Other amnesia: Secondary | ICD-10-CM

## 2021-10-24 DIAGNOSIS — I10 Essential (primary) hypertension: Secondary | ICD-10-CM

## 2021-10-24 DIAGNOSIS — R011 Cardiac murmur, unspecified: Secondary | ICD-10-CM

## 2021-10-24 DIAGNOSIS — R42 Dizziness and giddiness: Secondary | ICD-10-CM

## 2021-10-24 DIAGNOSIS — E559 Vitamin D deficiency, unspecified: Secondary | ICD-10-CM | POA: Diagnosis not present

## 2021-10-24 DIAGNOSIS — E782 Mixed hyperlipidemia: Secondary | ICD-10-CM

## 2021-10-24 DIAGNOSIS — R739 Hyperglycemia, unspecified: Secondary | ICD-10-CM | POA: Diagnosis not present

## 2021-10-24 NOTE — Patient Instructions (Signed)
RSV immunization at the pharmacy   Heart Murmur A heart murmur is an extra sound that is caused by turbulent blood flow through the valves of the heart. The murmur can be heard as a "hum" or "whoosh" sound when blood flows through the heart. The heart has four areas called chambers. There is a valve for each chamber for the heart. Blood passes through a valve before leaving the chamber. Two of the valves move the blood from the upper chambers of the heart to the lower chambers of the heart (tricuspid valve and mitral valve). The other two valves (aortic valve and pulmonary valve) move the blood to the lungs and the rest of the body. The valves keep blood moving through the heart in the right direction. When the heart valves are not working properly it may cause a murmur. There are two types of heart murmurs: Innocent (benign) murmurs. Most people with this type of heart murmur do not have a heart problem. Many children have innocent heart murmurs. Your health care provider may suggest some basic tests to find out whether your murmur is an innocent murmur. If an innocent heart murmur is found, there is no need for further tests or treatment and no need to restrict activities or stop playing sports. Abnormal murmurs. These types of murmurs can occur in children and adults. Abnormal murmurs may be a sign of a more serious heart condition, such as a heart abnormality present at birth (congenital defect) or heart valve disease. What are the causes? This condition may also be caused by: Pregnancy. Fever. Overactive thyroid gland. Anemia. Exercise. Rapid growth spurts in children. What are the signs or symptoms? Innocent murmurs do not cause symptoms, and many people with abnormal murmurs may not have symptoms. If symptoms do develop, they may include: Shortness of breath or persistent cough. Blue coloring of the skin, especially on the fingertips. Chest pain. Palpitations, or feeling a fluttering or  skipped heartbeat. Fainting. Getting tired much faster than expected. Swelling in the abdomen, feet, or ankles. How is this diagnosed? This condition may be diagnosed during a routine physical or other exam. If your health care provider hears a murmur with a stethoscope, he or she will listen for: Where the murmur is located in your heart. How loud the murmur is. This may help the health care provider figure out what is causing the murmur. You may be referred to a heart specialist (cardiologist). You may also have other tests, including: Electrocardiogram (ECG or EKG). This test measures the electrical activity of your heart. Echocardiogram. This test uses high frequency sound waves to make pictures of your heart. Chest X-ray. Magnetic resonance imaging (MRI). Cardiac catheterization. This test looks at blood flow through the arteries around the heart. For children and adults who have an abnormal heart murmur and want to stay active, it is important to: Complete testing. Review test results. Receive recommendations from your health care provider. If heart disease is present, it may not be safe to play or be involved in activities that require a lot of effort and energy (are strenuous). How is this treated? Heart murmurs themselves do not need treatment. In some cases, a heart murmur may go away on its own. If an underlying problem or disease is causing the murmur, you may need treatment. If treatment is needed, it will depend on the type and severity of the disease or heart problem causing the murmur. Treatment may include: Medicine. Surgery. Changes to your lifestyle and diet. Follow  these instructions at home: Talk with your health care provider before participating in sports or other activities that are strenuous. Learn as much as possible about your condition and any related diseases. Ask your health care provider if you may be at risk for any medical emergencies. Talk with your health  care provider about what symptoms you should look out for. It is up to you to get your test results. Ask your health care provider, or the department that is doing the test, when your results will be ready. Keep all follow-up visits. This is important. Contact a health care provider if: You are frequently short of breath. You feel more tired than usual. You are having a hard time keeping up with normal activities or fitness routines. You have swelling in your ankles or feet. You notice that your heart often beats irregularly. You develop any new symptoms. Get help right away if: You have chest pain. You are having trouble breathing. You feel light-headed or you faint. Your symptoms suddenly get worse. These symptoms may represent a serious problem that is an emergency. Do not wait to see if the symptoms will go away. Get medical help right away. Call your local emergency services (911 in the [place] Do not drive yourself to the hospital. Summary Heart valves keep blood moving through the heart in the right direction. When the valves are not working properly it may cause a murmur. Innocent murmurs do not cause symptoms, and many people with abnormal murmurs may not have symptoms. You may need treatment if an underlying problem or disease is causing the heart murmur. Treatment may include medicine, surgery, and changes to your lifestyle and diet. Talk with your health care provider before participating in sports or other activities that are strenuous. Get help right away if you have chest pain or trouble breathing. This information is not intended to replace advice given to you by your health care provider. Make sure you discuss any questions you have with your health care provider. Document Revised: 05/28/2020 Document Reviewed: 05/28/2020 Elsevier Patient Education  [place].

## 2021-10-24 NOTE — Progress Notes (Signed)
Subjective:   By signing my name below, I, Kellie Simmering, attest that this documentation has been prepared under the direction and in the presence of Mosie Lukes, MD 10/24/2021.    Patient ID: Christine Henderson, female    DOB: 01/06/1931, 88 y.o.   MRN: [id number]  Chief Complaint  Patient presents with   Follow-up    Here for follow up    HPI Patient is in today for an office visit.  Dizziness: She reports that on 10/20/2021, she woke up between 3 am and 4 am to use the restroom but felt dizzy. She states that she did not use the restroom and returned to bed. She also reports having a dizziness spell several months ago. She says that this spell lasted several days and she did not go to urgent care. She denies having headaches, earaches and vision changes. She did see an ENT but says that she was feeling better by the time she saw them.   Memory: She reports that her memory has declined and presented this during today's visit.   Family history: She states that her sister has Alzheimer's.  Immunizations: She has been informed about receiving the Flu and RSV vaccinations.   Past Medical History:  Diagnosis Date   Abdominal pain, unspecified site 06/26/2008   ALLERGIC RHINITIS CAUSE UNSPECIFIED 02/02/2008   Annual physical exam 12/26/2012   Diarrhea 06/26/2008   Fibrocystic breast disease    Headache(784.0) 10/01/2006   Hereditary and idiopathic peripheral neuropathy 01/10/2016   HYPERLIPIDEMIA 10/01/2006   HYPERTENSION 10/06/2007   IBS (irritable bowel syndrome)    Insomnia 11/14/2012   Irritable bowel syndrome 09/10/2006   Medicare annual wellness visit, subsequent 12/26/2012   Sees Dr Elinor Parkinson of opthamology.  MGM 5/14      Metatarsal stress fracture 01/20/2016   MIGRAINE HEADACHE 09/10/2006   MVP (mitral valve prolapse)    Onychomycosis 07/09/2014   OSTEOPENIA 09/10/2006   Osteopenia 11/12/2012   Palpitations 09/10/2006   PHOTOPHOBIA 07/20/2009   SKIN TAG 11/09/2006    VAGINITIS, ATROPHIC 10/01/2006   Vitamin D deficiency 07/14/2016   Past Surgical History:  Procedure Laterality Date   DENTAL SURGERY  09/2014   ROTATOR CUFF REPAIR Right    TONSILLECTOMY     Family History  Problem Relation Age of Onset   Inflammatory bowel disease Sister    Alzheimer's disease Sister    Pancreatic cancer Maternal Grandfather    Alzheimer's disease Mother    Hyperlipidemia Son    Obesity Son    Hyperlipidemia Son    Heart disease Paternal Uncle    Coronary artery disease Neg Hx    Social History   Socioeconomic History   Marital status: Widowed    Spouse name: Not on file   Number of children: 4   Years of education: Not on file   Highest education level: Not on file  Occupational History   Occupation: retired    Fish farm manager: RETIRED  Tobacco Use   Smoking status: Never   Smokeless tobacco: Never  Substance and Sexual Activity   Alcohol use: No   Drug use: No   Sexual activity: Never    Birth control/protection: None    Comment: regular exercise, lives at the [place], widowed in 2014  Other Topics Concern   Not on file  Social History Narrative   Not on file   Social Determinants of Health   Financial Resource Strain: [place]  (10/11/2020)   Overall Emergency planning/management officer Strain (  CARDIA)    Difficulty of Paying Living Expenses: Not hard at all  Food Insecurity: No Food Insecurity (10/11/2020)   Hunger Vital Sign    Worried About Running Out of Food in the Last Year: Never true    Ran Out of Food in the Last Year: Never true  Transportation Needs: No Transportation Needs (10/11/2020)   PRAPARE - Hydrologist (Medical): No    Lack of Transportation (Non-Medical): No  Physical Activity: Insufficiently Active (10/11/2020)   Exercise Vital Sign    Days of Exercise per Week: 4 days    Minutes of Exercise per Session: 30 min  Stress: No Stress Concern Present (10/11/2020)   [place]    Feeling of Stress : Not at all  Social Connections: Moderately Isolated (10/11/2020)   Social Connection and Isolation Panel [NHANES]    Frequency of Communication with Friends and Family: More than three times a week    Frequency of Social Gatherings with Friends and Family: More than three times a week    Attends Religious Services: More than 4 times per year    Active Member of [employer] or Organizations: No    Attends Archivist Meetings: Never    Marital Status: Widowed  Intimate Partner Violence: Not At Risk (10/11/2020)   Humiliation, Afraid, Rape, and Kick questionnaire    Fear of Current or Ex-Partner: No    Emotionally Abused: No    Physically Abused: No    Sexually Abused: No   Outpatient Medications Prior to Visit  Medication Sig Dispense Refill   albuterol (VENTOLIN HFA) 108 (90 Base) MCG/ACT inhaler Inhale 2 puffs into the lungs every 6 (six) hours as needed for wheezing or shortness of breath. 8 g 0   Ascorbic Acid (VITAMIN C) 1000 MG tablet Take 1,000 mg by mouth daily.     Calcium Carbonate-Vitamin D (CALCIUM PLUS VITAMIN D PO) Take 1,200 tablets by mouth daily.     fish oil-omega-3 fatty acids 1000 MG capsule Take 2 g by mouth daily.     Glucosamine-Chondroit-Vit C-Mn (GLUCOSAMINE 1500 COMPLEX) CAPS Take 1,500 capsules by mouth 2 (two) times daily.     losartan (COZAAR) 25 MG tablet TAKE 1/2 TO 1 TABLET BY  MOUTH DAILY 90 tablet 3   Multiple Vitamin (MULTIVITAMIN) tablet Take 1 tablet by mouth daily.     neomycin-polymyxin-hydrocortisone (CORTISPORIN) OTIC solution Place 4 drops into the right ear 3 (three) times daily. 10 mL 0   simvastatin (ZOCOR) 10 MG tablet TAKE 1 TABLET BY MOUTH AT  BEDTIME 90 tablet 3   No facility-administered medications prior to visit.   Allergies  Allergen Reactions   Codeine     REACTION: Nausea  Vomiting   Review of Systems  HENT:  Negative for ear pain.   Neurological:  Negative for  headaches.      Objective:    Physical Exam Constitutional:      General: She is not in acute distress.    Appearance: Normal appearance. She is not ill-appearing.  HENT:     Head: Normocephalic and atraumatic.     Right Ear: Tympanic membrane, ear canal and external ear normal.     Left Ear: Tympanic membrane, ear canal and external ear normal.     Mouth/Throat:     Mouth: Mucous membranes are moist.     Pharynx: Oropharynx is clear.  Eyes:     Extraocular Movements:  Extraocular movements intact.     Pupils: Pupils are equal, round, and reactive to light.  Cardiovascular:     Rate and Rhythm: Normal rate and regular rhythm.     Pulses: Normal pulses.     Heart sounds: Murmur heard.     No gallop.  Pulmonary:     Effort: Pulmonary effort is normal. No respiratory distress.     Breath sounds: Normal breath sounds. No wheezing or rales.  Abdominal:     General: Bowel sounds are normal.  Skin:    General: Skin is warm and dry.  Neurological:     Mental Status: She is alert and oriented to person, place, and time.     Comments: She was unable to spell "world" backwards, showed difficulty answering a mathematical problem and had hesitancy with providing today's date.   Psychiatric:        Mood and Affect: Mood normal.        Behavior: Behavior normal.        Judgment: Judgment normal.    BP 128/80 (BP Location: Right Arm, Patient Position: Sitting, Cuff Size: Normal)   Pulse 62   Temp 98 F (36.7 C) (Oral)   Resp 16   Ht '5\' 4"'$  (1.626 m)   Wt 120 lb 6.4 oz (54.6 kg)   SpO2 94%   BMI 20.67 kg/m  Wt Readings from Last 3 Encounters:  10/24/21 120 lb 6.4 oz (54.6 kg)  11/23/20 117 lb 6.4 oz (53.3 kg)  11/07/20 118 lb 3.2 oz (53.6 kg)   Diabetic Foot Exam - Simple   No data filed    Lab Results  Component Value Date   WBC 7.3 11/07/2020   HGB 12.8 11/07/2020   HCT 39.1 11/07/2020   PLT 265.0 11/07/2020   GLUCOSE 104 (H) 11/07/2020   CHOL 157 05/08/2020   TRIG  78.0 05/08/2020   HDL 68.40 05/08/2020   LDLCALC 73 05/08/2020   ALT 15 11/07/2020   AST 24 11/07/2020   NA 140 11/07/2020   K 4.7 11/07/2020   CL 102 11/07/2020   CREATININE 1.04 11/07/2020   BUN 34 (H) 11/07/2020   CO2 29 11/07/2020   TSH 2.31 11/07/2020   HGBA1C 6.2 11/07/2020   Lab Results  Component Value Date   TSH 2.31 11/07/2020   Lab Results  Component Value Date   WBC 7.3 11/07/2020   HGB 12.8 11/07/2020   HCT 39.1 11/07/2020   MCV 96.6 11/07/2020   PLT 265.0 11/07/2020   Lab Results  Component Value Date   NA 140 11/07/2020   K 4.7 11/07/2020   CO2 29 11/07/2020   GLUCOSE 104 (H) 11/07/2020   BUN 34 (H) 11/07/2020   CREATININE 1.04 11/07/2020   BILITOT 0.9 11/07/2020   ALKPHOS 36 (L) 11/07/2020   AST 24 11/07/2020   ALT 15 11/07/2020   PROT 7.3 11/07/2020   ALBUMIN 4.3 11/07/2020   CALCIUM 10.1 11/07/2020   ANIONGAP 8 10/23/2020   GFR 47.94 (L) 11/07/2020   Lab Results  Component Value Date   CHOL 157 05/08/2020   Lab Results  Component Value Date   HDL 68.40 05/08/2020   Lab Results  Component Value Date   LDLCALC 73 05/08/2020   Lab Results  Component Value Date   TRIG 78.0 05/08/2020   Lab Results  Component Value Date   CHOLHDL 2 05/08/2020   Lab Results  Component Value Date   HGBA1C 6.2 11/07/2020  Assessment & Plan:   Problem List Items Addressed This Visit     Hyperlipidemia - Primary    Encourage heart healthy diet such as MIND or DASH diet, increase exercise, avoid trans fats, simple carbohydrates and processed foods, consider a krill or fish or flaxseed oil cap daily. Tolerating Simvastatin      Relevant Orders   Lipid panel   Essential hypertension    Well controlled, no changes to meds. Encouraged heart healthy diet such as the DASH diet and exercise as tolerated.       Relevant Orders   CBC   Comprehensive metabolic panel   TSH   Hyperglycemia    hgba1c acceptable, minimize simple carbs. Increase  exercise as tolerated.       Relevant Orders   Hemoglobin A1c   Vitamin D deficiency    Supplement and monitor      Relevant Orders   VITAMIN D 25 Hydroxy (Vit-D Deficiency, Fractures)   Memory loss    She agrees to referral to Neuropsychology and neurology for further consideration      Relevant Orders   Ambulatory referral to Neuropsychology   Ambulatory referral to Neurology   Disequilibrium    She describes two episodes separate by several months. Will proceed with neurology consult, echo and carotid dopplers. Consider dehydration vs low blood sugar. She denies true spinning but acknowledges feeling woozey. Episodes resolved with rest after a short time and did not result in injury. Encouraged to drink 60-80 ounces of fluids spaced across the day and proteins every 4-5 hours.       Relevant Orders   Ambulatory referral to Neurology   US Carotid Bilateral   Heart murmur    Check echo to evaluate      Relevant Orders   ECHOCARDIOGRAM COMPLETE   No orders of the defined types were placed in this encounter.  I, Penni Homans, MD, personally preformed the services described in this documentation.  All medical record entries made by the scribe were at my direction and in my presence.  I have reviewed the chart and discharge instructions (if applicable) and agree that the record reflects my personal performance and is accurate and complete. 10/24/2021  I,Mohammed Iqbal,acting as a scribe for Penni Homans, MD.,have documented all relevant documentation on the behalf of Penni Homans, MD,as directed by  Penni Homans, MD while in the presence of Penni Homans, MD.  Penni Homans, MD

## 2021-10-25 ENCOUNTER — Telehealth: Payer: Self-pay

## 2021-10-25 DIAGNOSIS — R42 Dizziness and giddiness: Secondary | ICD-10-CM

## 2021-10-25 DIAGNOSIS — R011 Cardiac murmur, unspecified: Secondary | ICD-10-CM

## 2021-10-25 DIAGNOSIS — R413 Other amnesia: Secondary | ICD-10-CM | POA: Insufficient documentation

## 2021-10-25 HISTORY — DX: Cardiac murmur, unspecified: R01.1

## 2021-10-25 HISTORY — DX: Dizziness and giddiness: R42

## 2021-10-25 NOTE — Telephone Encounter (Signed)
Called pt son Lvm to call us back and sent message

## 2021-10-25 NOTE — Assessment & Plan Note (Signed)
She agrees to referral to Neuropsychology and neurology for further consideration

## 2021-10-25 NOTE — Assessment & Plan Note (Signed)
She describes two episodes separate by several months. Will proceed with neurology consult, echo and carotid dopplers. Consider dehydration vs low blood sugar. She denies true spinning but acknowledges feeling woozey. Episodes resolved with rest after a short time and did not result in injury. Encouraged to drink 60-80 ounces of fluids spaced across the day and proteins every 4-5 hours.

## 2021-10-25 NOTE — Assessment & Plan Note (Signed)
Well controlled, no changes to meds. Encouraged heart healthy diet such as the DASH diet and exercise as tolerated.  °

## 2021-10-25 NOTE — Assessment & Plan Note (Signed)
Supplement and monitor 

## 2021-10-25 NOTE — Assessment & Plan Note (Signed)
Check echo to evaluate

## 2021-10-25 NOTE — Assessment & Plan Note (Addendum)
Encourage heart healthy diet such as MIND or DASH diet, increase exercise, avoid trans fats, simple carbohydrates and processed foods, consider a krill or fish or flaxseed oil cap daily. Tolerating Simvastatin 

## 2021-10-25 NOTE — Telephone Encounter (Signed)
Pt son called back and was advised of the visit  And lab appt  with 3 month follow up

## 2021-10-25 NOTE — Assessment & Plan Note (Signed)
hgba1c acceptable, minimize simple carbs. Increase exercise as tolerated.  

## 2021-10-29 ENCOUNTER — Other Ambulatory Visit (INDEPENDENT_AMBULATORY_CARE_PROVIDER_SITE_OTHER): Payer: Medicare Other

## 2021-10-29 DIAGNOSIS — R739 Hyperglycemia, unspecified: Secondary | ICD-10-CM

## 2021-10-29 DIAGNOSIS — E782 Mixed hyperlipidemia: Secondary | ICD-10-CM | POA: Diagnosis not present

## 2021-10-29 DIAGNOSIS — I10 Essential (primary) hypertension: Secondary | ICD-10-CM | POA: Diagnosis not present

## 2021-10-29 DIAGNOSIS — E559 Vitamin D deficiency, unspecified: Secondary | ICD-10-CM | POA: Diagnosis not present

## 2021-10-29 LAB — COMPREHENSIVE METABOLIC PANEL
ALT: 18 U/L (ref 0–35)
AST: 26 U/L (ref 0–37)
Albumin: 4.2 g/dL (ref 3.5–5.2)
Alkaline Phosphatase: 44 U/L (ref 39–117)
BUN: 29 mg/dL — ABNORMAL HIGH (ref 6–23)
CO2: 28 mEq/L (ref 19–32)
Calcium: 9.5 mg/dL (ref 8.4–10.5)
Chloride: 103 mEq/L (ref 96–112)
Creatinine, Ser: 1.04 mg/dL (ref 0.40–1.20)
GFR: 47.62 mL/min — ABNORMAL LOW (ref >60.00)
Glucose, Bld: 108 mg/dL — ABNORMAL HIGH (ref 70–99)
Potassium: 3.9 mEq/L (ref 3.5–5.1)
Sodium: 141 mEq/L (ref 135–145)
Total Bilirubin: 1.1 mg/dL (ref 0.2–1.2)
Total Protein: 7.2 g/dL (ref 6.0–8.3)

## 2021-10-29 LAB — LIPID PANEL
Cholesterol: 214 mg/dL — ABNORMAL HIGH (ref 0–200)
HDL: 64.7 mg/dL (ref >39.00)
LDL Cholesterol: 125 mg/dL — ABNORMAL HIGH (ref 0–99)
NonHDL: 148.93
Total CHOL/HDL Ratio: 3
Triglycerides: 121 mg/dL (ref 0.0–149.0)
VLDL: 24.2 mg/dL (ref 0.0–40.0)

## 2021-10-29 LAB — CBC
HCT: 37.4 % (ref 36.0–46.0)
Hemoglobin: 12.6 g/dL (ref 12.0–15.0)
MCHC: 33.7 g/dL (ref 30.0–36.0)
MCV: 97.1 fl (ref 78.0–100.0)
Platelets: 262 10*3/uL (ref 150.0–400.0)
RBC: 3.85 Mil/uL — ABNORMAL LOW (ref 3.87–5.11)
RDW: 15.2 % (ref 11.5–15.5)
WBC: 6.7 10*3/uL (ref 4.0–10.5)

## 2021-10-29 LAB — TSH: TSH: 5.25 u[IU]/mL (ref 0.35–5.50)

## 2021-10-29 LAB — HEMOGLOBIN A1C: Hgb A1c MFr Bld: 6.1 % (ref 4.6–6.5)

## 2021-10-29 LAB — VITAMIN D 25 HYDROXY (VIT D DEFICIENCY, FRACTURES): VITD: 64.31 ng/mL (ref 30.00–100.00)

## 2021-11-01 ENCOUNTER — Encounter: Payer: Self-pay | Admitting: Physician Assistant

## 2021-11-01 ENCOUNTER — Ambulatory Visit: Payer: Medicare Other | Admitting: Physician Assistant

## 2021-11-01 VITALS — BP 182/108 | HR 96 | Ht 64.0 in | Wt 118.0 lb

## 2021-11-01 DIAGNOSIS — R413 Other amnesia: Secondary | ICD-10-CM | POA: Diagnosis not present

## 2021-11-01 MED ORDER — DONEPEZIL HCL 10 MG PO TABS: ORAL_TABLET | ORAL | 3 refills | Status: DC

## 2021-11-01 NOTE — Progress Notes (Signed)
Assessment/Plan:    The patient is seen in neurologic consultation at the request of Mosie Lukes, MD for the evaluation of memory.  Christine Henderson is a very pleasant 88 y.o. year old RH female with  a history of hypertension, hyperlipidemia, hyperglycemia, Vit D deficiency, seen today for evaluation of memory loss. MoCA unable to be performed, MMSE was 23/30.  Work-up is in progress.  Mixed vascular and Alzheimer's dementia   MRI brain without contrast to assess for underlying structural abnormality and assess vascular load  Start Donepezil 10 mg :Take half tablet (5 mg) daily for 2 weeks, then 1 full tablet at 10 mg daily.  Side effects were discussed Recommend good control of cardiovascular risk factors.  Of note, her blood pressure was quite elevated today, it is unclear if she took the blood pressure medication.  This was discussed with the patient. Folllow up  in 1 month to discuss the results of the MRI  Subjective:    The patient is accompanied by son  who supplements the history.    How long did patient have memory difficulties?  Over the last 6 months she had difficulty with names of people.  However, prior to that, he had noticed that the patient had shown some signs of to 4 years ago.  "Last night she went to eat and could not make a decision ".  She enjoys reading the newspaper daily, but cannot do any crossword puzzles or word finding.  Short-term memory is worse than long-term memory.  Patient lives with:  Patient lives alone repeats oneself?  Occasionally "not too often" Disoriented when walking into a room?  Patient denies   Leaving objects in unusual places?  Patient denies   Ambulates  with difficulty?   Patient denies, although "she has more balance issues than prior "-her son says Recent falls?  About 2 months ago, she had a mechanical fall in the bathtub, without loss of consciousness.  She did not seek medical attention.   Any head injuries?  Patient denies    History of seizures?   Patient denies   Wandering behavior?  Patient denies   Patient drives? Short distances only.  About 2 years ago, the patient had one episode in which he got lost, without recurrence.    Any mood changes?  Patient denies   Any history of depression?:  Patient denies   Hallucinations?  Patient denies   Paranoia?  Patient denies   Patient reports that sleeps well without vivid dreams, REM behavior or sleepwalking . Takes melatonin prn  History of sleep apnea?  Patient denies   Any hygiene concerns?  Patient denies   Independent of bathing and dressing?  Endorsed  Does the patient needs help with medications?  Patient denies   Who is in charge of the finances?  Patient is in charge  with son's supervision  Any changes in appetite?  Does not eat as much "food does not taste as good"  Patient have trouble swallowing? Patient denies   Does the patient cook?  Patient denies   Any kitchen accidents such as leaving the stove on? Patient denies   Any headaches?  Patient denies   Double vision? Patient denies   Any focal numbness or tingling?  Patient denies   Chronic back pain Patient denies   Unilateral weakness?  Patient denies   Any tremors?  Patient denies   Any history of anosmia?  Patient denies   Any incontinence of  urine?  Patient denies   Any bowel dysfunction?   Patient denies   History of heavy alcohol intake?  Patient denies   History of heavy tobacco use?  Patient denies   Family history of dementia?     Mother, Father, and Sister  had  Alzheimer's disease    Allergies  Allergen Reactions   Codeine     REACTION: Nausea  Vomiting    Current Outpatient Medications  Medication Instructions   albuterol (VENTOLIN HFA) 108 (90 Base) MCG/ACT inhaler 2 puffs, Inhalation, Every 6 hours PRN   Calcium Carbonate-Vitamin D (CALCIUM PLUS VITAMIN D PO) 1,200 tablets, Oral, Daily   donepezil (ARICEPT) 10 MG tablet Start Donepezil 10 mg :Take half tablet (5 mg)  daily for 2 weeks, then 1 full tablet at 10 mg daily.   fish oil-omega-3 fatty acids 2 g, Oral, Daily,     Glucosamine-Chondroit-Vit C-Mn (GLUCOSAMINE 1500 COMPLEX) CAPS 1,500 capsules, Oral, 2 times daily,     losartan (COZAAR) 25 MG tablet TAKE 1/2 TO 1 TABLET BY  MOUTH DAILY   Multiple Vitamin (MULTIVITAMIN) tablet 1 tablet, Oral, Daily,     neomycin-polymyxin-hydrocortisone (CORTISPORIN) OTIC solution 4 drops, Right EAR, 3 times daily   simvastatin (ZOCOR) 10 MG tablet TAKE 1 TABLET BY MOUTH AT  BEDTIME   vitamin C 1,000 mg, Oral, Daily,       VITALS:   Vitals:   11/01/21 0949  BP: (!) 182/108  Pulse: 96  SpO2: 94%  Weight: 118 lb (53.5 kg)  Height: '5\' 4"'$  (1.626 m)    PHYSICAL EXAM   HEENT:  Normocephalic, atraumatic. The mucous membranes are moist. The superficial temporal arteries are without ropiness or tenderness. Cardiovascular: Regular rate and rhythm. Lungs: Clear to auscultation bilaterally. Neck: There are no carotid bruits noted bilaterally.  NEUROLOGICAL:     No data to display             11/01/2021   10:00 AM 01/13/2017   10:32 AM  MMSE - Mini Mental State Exam  Orientation to time 5 5  Orientation to Place 5 5  Registration 3 3  Attention/ Calculation 1 5  Recall 3 3  Language- name 2 objects 2 2  Language- repeat 0 1  Language- follow 3 step command 3 3  Language- read & follow direction 1 1  Write a sentence 0 1  Copy design 0 1  Total score 23 30     Orientation:  Alert and oriented to person, place and time. No aphasia or dysarthria. Fund of knowledge is appropriate. Recent and remote memory impaired.  Attention and concentration are normal.  Able to name objects and repeat phrases. Delayed recall 3/3 Cranial nerves: There is good facial symmetry. Extraocular muscles are intact and visual fields are full to confrontational testing. Speech is fluent and clear. Soft palate rises symmetrically and there is no tongue deviation. Hearing is intact  to conversational tone. Tone: Tone is good throughout. Sensation: Sensation is intact to light touch and pinprick throughout. Vibration is intact at the bilateral big toe.There is no extinction with double simultaneous stimulation. There is no sensory dermatomal level identified. Coordination: The patient has no difficulty with RAM's or FNF bilaterally. Normal finger to nose  Motor: Strength is 5/5 in the bilateral upper and lower extremities. There is no pronator drift. There are no fasciculations noted. DTR's: Deep tendon reflexes are 2/4 at the bilateral biceps, triceps, brachioradialis, patella and achilles.  Plantar responses are downgoing bilaterally. Gait  and Station: The patient is able to ambulate without difficulty.The patient is able to heel toe walk without any difficulty.The patient is able to ambulate in a tandem fashion. The patient is able to stand in the Romberg position.     Thank you for allowing [place] the opportunity to participate in the care of this nice patient. Please do not hesitate to contact us for any questions or concerns.   Total time spent on today's visit was 50  minutes dedicated to this patient today, preparing to see patient, examining the patient, ordering tests and/or medications and counseling the patient, documenting clinical information in the EHR or other health record, independently interpreting results and communicating results to the patient/family, discussing treatment and goals, answering patient's questions and coordinating care.  Cc:  Mosie Lukes, MD  Sharene Butters 11/01/2021 12:09 PM

## 2021-11-01 NOTE — Patient Instructions (Addendum)
It was a pleasure to see you today at our office.   Recommendations:  Follow up in 1 months  Start Donepezil 10 mg :Take half tablet (5 mg) daily for 2 weeks, then increase to the full tablet at 10 mg daily. Side effects discussed   MRI brain    Whom to call:  Memory  decline, memory medications: Call our office [phone]   For psychiatric meds, mood meds: Please have your primary care physician manage these medications.   Counseling regarding caregiver distress, including caregiver depression, anxiety and issues regarding community resources, adult day care programs, adult living facilities, or memory care questions:   Feel free to contact [place], Social Worker at [phone]   For assessment of decision of mental capacity and competency:  Call Dr. Anthoney Harada, geriatric psychiatrist at [phone]  For guidance in geriatric dementia issues please call [facility] [phone]  For guidance regarding [facility] and if placement were needed at the facility, contact Arnell Asal, Social Worker tel: [phone]  If you have any severe symptoms of a stroke, or other severe issues such as confusion,severe chills or fever, etc call 911 or go to the ER as you may need to be evaluated further   Feel free to visit Facebook page " [email or web address]" for tips of how to care for people with memory problems.       RECOMMENDATIONS FOR ALL PATIENTS WITH MEMORY PROBLEMS: 1. Continue to exercise (Recommend 30 minutes of walking everyday, or 3 hours every week) 2. Increase social interactions - continue going to [place] and enjoy social gatherings with friends and family 3. Eat healthy, avoid fried foods and eat more fruits and vegetables 4. Maintain adequate blood pressure, blood sugar, and blood cholesterol level. Reducing the risk of stroke and cardiovascular disease also helps promoting better memory. 5. Avoid stressful situations. Live a simple  life and avoid aggravations. Organize your time and prepare for the next day in anticipation. 6. Sleep well, avoid any interruptions of sleep and avoid any distractions in the bedroom that may interfere with adequate sleep quality 7. Avoid sugar, avoid sweets as there is a strong link between excessive sugar intake, diabetes, and cognitive impairment We discussed the Mediterranean diet, which has been shown to help patients reduce the risk of progressive memory disorders and reduces cardiovascular risk. This includes eating fish, eat fruits and green leafy vegetables, nuts like almonds and hazelnuts, walnuts, and also use olive oil. Avoid fast foods and fried foods as much as possible. Avoid sweets and sugar as sugar use has been linked to worsening of memory function.  There is always a concern of gradual progression of memory problems. If this is the case, then we may need to adjust level of care according to patient needs. Support, both to the patient and caregiver, should then be put into place.    The Alzheimer[employer] Association is here all day, every day for people facing Alzheimer's disease through our free 24/7 Helpline: [phone]. The Helpline provides reliable information and support to all those who need assistance, such as individuals living with memory loss, Alzheimer's or other dementia, caregivers, health care professionals and the public.  Our highly trained and knowledgeable staff can help you with: Understanding memory loss, dementia and Alzheimer's  Medications and other treatment options  General information about aging and brain health  Skills to provide quality care and to find the best care from professionals  Legal, financial and living-arrangement decisions Our Helpline  also features: Confidential care consultation provided by master's level clinicians who can help with decision-making support, crisis assistance and education on issues families face every day  Help in a  caller's preferred language using our translation service that features more than 200 languages and dialects  Referrals to local community programs, services and ongoing support     FALL PRECAUTIONS: Be cautious when walking. Scan the area for obstacles that may increase the risk of trips and falls. When getting up in the mornings, sit up at the edge of the bed for a few minutes before getting out of bed. Consider elevating the bed at the head end to avoid drop of blood pressure when getting up. Walk always in a well-lit room (use night lights in the walls). Avoid area rugs or power cords from appliances in the middle of the walkways. Use a walker or a cane if necessary and consider physical therapy for balance exercise. Get your eyesight checked regularly.  FINANCIAL OVERSIGHT: Supervision, especially oversight when making financial decisions or transactions is also recommended.  HOME SAFETY: Consider the safety of the kitchen when operating appliances like stoves, microwave oven, and blender. Consider having supervision and share cooking responsibilities until no longer able to participate in those. Accidents with firearms and other hazards in the house should be identified and addressed as well.   ABILITY TO BE LEFT ALONE: If patient is unable to contact 911 operator, consider using LifeLine, or when the need is there, arrange for someone to stay with patients. Smoking is a fire hazard, consider supervision or cessation. Risk of wandering should be assessed by caregiver and if detected at any point, supervision and safe proof recommendations should be instituted.  MEDICATION SUPERVISION: Inability to self-administer medication needs to be constantly addressed. Implement a mechanism to ensure safe administration of the medications.   DRIVING: Regarding driving, in patients with progressive memory problems, driving will be impaired. We advise to have someone else do the driving if trouble finding  directions or if minor accidents are reported. Independent driving assessment is available to determine safety of driving.   If you are interested in the driving assessment, you can contact the following:  [employer] in [place]  [place] [phone] or [phone]      [place] refers to food and lifestyle choices that are based on the traditions of countries located on the [employer]. This way of eating has been shown to help prevent certain conditions and improve outcomes for people who have chronic diseases, like kidney disease and heart disease. What are tips for following this plan? Lifestyle  Cook and eat meals together with your family, when possible. Drink enough fluid to keep your urine clear or pale yellow. Be physically active every day. This includes: Aerobic exercise like running or swimming. Leisure activities like gardening, walking, or housework. Get 7-8 hours of sleep each night. If recommended by your health care provider, drink red wine in moderation. This means 1 glass a day for nonpregnant women and 2 glasses a day for men. A glass of wine equals 5 oz (150 mL). Reading food labels  Check the serving size of packaged foods. For foods such as rice and pasta, the serving size refers to the amount of cooked product, not dry. Check the total fat in packaged foods. Avoid foods that have saturated fat or trans fats. Check the ingredients list for added  sugars, such as corn syrup. Shopping  At the grocery store, buy most of your food from the areas near the walls of the store. This includes: Fresh fruits and vegetables (produce). Grains, beans, nuts, and seeds. Some of these may be available in unpackaged forms or large amounts (in bulk). Fresh seafood. Poultry and eggs. Low-fat dairy products. Buy whole  ingredients instead of prepackaged foods. Buy fresh fruits and vegetables in-season from local farmers markets. Buy frozen fruits and vegetables in resealable bags. If you do not have access to quality fresh seafood, buy precooked frozen shrimp or canned fish, such as tuna, salmon, or sardines. Buy small amounts of raw or cooked vegetables, salads, or olives from the deli or salad bar at your store. Stock your pantry so you always have certain foods on hand, such as olive oil, canned tuna, canned tomatoes, rice, pasta, and beans. Cooking  Cook foods with extra-virgin olive oil instead of using butter or other vegetable oils. Have meat as a side dish, and have vegetables or grains as your main dish. This means having meat in small portions or adding small amounts of meat to foods like pasta or stew. Use beans or vegetables instead of meat in common dishes like chili or lasagna. Experiment with different cooking methods. Try roasting or broiling vegetables instead of steaming or sauteing them. Add frozen vegetables to soups, stews, pasta, or rice. Add nuts or seeds for added healthy fat at each meal. You can add these to yogurt, salads, or vegetable dishes. Marinate fish or vegetables using olive oil, lemon juice, garlic, and fresh herbs. Meal planning  Plan to eat 1 vegetarian meal one day each week. Try to work up to 2 vegetarian meals, if possible. Eat seafood 2 or more times a week. Have healthy snacks readily available, such as: Vegetable sticks with hummus. Greek yogurt. Fruit and nut trail mix. Eat balanced meals throughout the week. This includes: Fruit: 2-3 servings a day Vegetables: 4-5 servings a day Low-fat dairy: 2 servings a day Fish, poultry, or lean meat: 1 serving a day Beans and legumes: 2 or more servings a week Nuts and seeds: 1-2 servings a day Whole grains: 6-8 servings a day Extra-virgin olive oil: 3-4 servings a day Limit red meat and sweets to only a few servings  a month What are my food choices? Mediterranean diet Recommended Grains: Whole-grain pasta. Brown rice. Bulgar wheat. Polenta. Couscous. Whole-wheat bread. [place]. Vegetables: Artichokes. Beets. Broccoli. Cabbage. Carrots. Eggplant. Green beans. Chard. Kale. Spinach. Onions. Leeks. Peas. Squash. Tomatoes. Peppers. Radishes. Fruits: Apples. Apricots. Avocado. Berries. Bananas. Cherries. Dates. Figs. Grapes. Lemons. Melon. Oranges. Peaches. Plums. Pomegranate. Meats and other protein foods: Beans. Almonds. Sunflower seeds. Pine nuts. Peanuts. [place]. Salmon. Scallops. Shrimp. [place]. Tilapia. Clams. Oysters. Eggs. Dairy: Low-fat milk. Cheese. Greek yogurt. Beverages: Water. Red wine. Herbal tea. Fats and oils: Extra virgin olive oil. Avocado oil. Grape seed oil. Sweets and desserts: [place] yogurt with honey. Baked apples. Poached pears. Trail mix. Seasoning and other foods: Basil. Cilantro. Coriander. Cumin. Mint. Parsley. Sage. Rosemary. Tarragon. Garlic. Oregano. Thyme. Pepper. Balsalmic vinegar. Tahini. Hummus. Tomato sauce. Olives. Mushrooms. Limit these Grains: Prepackaged pasta or rice dishes. Prepackaged cereal with added sugar. Vegetables: Deep fried potatoes (french fries). Fruits: Fruit canned in syrup. Meats and other protein foods: Beef. Pork. Lamb. Poultry with skin. Hot dogs. Bernie[place] Salines. Dairy: Ice cream. Sour cream. Whole milk. Beverages: Juice. Sugar-sweetened soft drinks. Beer. Liquor and spirits. Fats and oils: Butter. Canola oil. Vegetable oil. Beef  fat (tallow). Lard. Sweets and desserts: Cookies. Cakes. Pies. Candy. Seasoning and other foods: Mayonnaise. Premade sauces and marinades. The items listed may not be a complete list. Talk with your dietitian about what dietary choices are right for you. Summary The Mediterranean diet includes both food and lifestyle choices. Eat a variety of fresh fruits and vegetables, beans, nuts, seeds, and whole grains. Limit the amount of  red meat and sweets that you eat. Talk with your health care provider about whether it is safe for you to drink red wine in moderation. This means 1 glass a day for nonpregnant women and 2 glasses a day for men. A glass of wine equals 5 oz (150 mL). This information is not intended to replace advice given to you by your health care provider. Make sure you discuss any questions you have with your health care provider. Document Released: 10/11/2015 Document Revised: 11/13/2015 Document Reviewed: 10/11/2015 Elsevier Interactive Patient Education  2017 [employer].    We have sent a referral to [facility] for your MRI and they will call you directly to schedule your appointment. They are located at [place]. If you need to contact them directly please call [phone].

## 2021-11-05 ENCOUNTER — Ambulatory Visit (HOSPITAL_BASED_OUTPATIENT_CLINIC_OR_DEPARTMENT_OTHER): Admission: RE | Admit: 2021-11-05 | Payer: Medicare Other | Source: Ambulatory Visit

## 2021-11-12 ENCOUNTER — Ambulatory Visit (HOSPITAL_BASED_OUTPATIENT_CLINIC_OR_DEPARTMENT_OTHER)
Admission: RE | Admit: 2021-11-12 | Discharge: 2021-11-12 | Disposition: A | Discharge status: Home / Self Care | Payer: Medicare Other | Source: Ambulatory Visit | Attending: Family Medicine | Admitting: Family Medicine

## 2021-11-12 DIAGNOSIS — R011 Cardiac murmur, unspecified: Secondary | ICD-10-CM | POA: Diagnosis not present

## 2021-11-12 LAB — ECHOCARDIOGRAM COMPLETE
AR max vel: 2.54 cm2
AV Area VTI: 2.51 cm2
AV Area mean vel: 2.49 cm2
AV Mean grad: 4 mmHg
AV Peak grad: 7.2 mmHg
Ao pk vel: 1.34 m/s
Area-P 1/2: 3.53 cm2
S' Lateral: 2.5 cm

## 2021-11-12 NOTE — Progress Notes (Signed)
  Echocardiogram 2D Echocardiogram has been performed.  Christine Henderson F 11/12/2021, 2:08 PM

## 2021-11-17 ENCOUNTER — Ambulatory Visit
Admission: RE | Admit: 2021-11-17 | Discharge: 2021-11-17 | Disposition: A | Discharge status: Home / Self Care | Payer: Medicare Other | Source: Ambulatory Visit | Attending: Physician Assistant | Admitting: Physician Assistant

## 2021-11-17 DIAGNOSIS — I619 Nontraumatic intracerebral hemorrhage, unspecified: Secondary | ICD-10-CM | POA: Diagnosis not present

## 2021-11-17 DIAGNOSIS — R413 Other amnesia: Secondary | ICD-10-CM | POA: Diagnosis not present

## 2021-11-17 DIAGNOSIS — I6782 Cerebral ischemia: Secondary | ICD-10-CM | POA: Diagnosis not present

## 2021-11-17 DIAGNOSIS — G319 Degenerative disease of nervous system, unspecified: Secondary | ICD-10-CM | POA: Diagnosis not present

## 2021-11-18 ENCOUNTER — Telehealth: Payer: Self-pay

## 2021-11-18 NOTE — Progress Notes (Signed)
COntinue 1/2 tablet for another week, she can try a whole pill after that, if symptoms are severe she can stay on the half tablet. Thanks

## 2021-11-18 NOTE — Progress Notes (Signed)
MRI brain show  moderate changes in circulation and moderate atrophy which may contribute to her memory changes. No acute findings. Continue taking donepezil for memory, thanks

## 2021-11-18 NOTE — Telephone Encounter (Signed)
Called and informed patient of answer per Clarise Cruz. Order was to continue 1/2 pill for a week and if diarrhea stops can go to 1 pill. Call and let us know results.

## 2021-11-26 ENCOUNTER — Telehealth (HOSPITAL_BASED_OUTPATIENT_CLINIC_OR_DEPARTMENT_OTHER): Payer: Self-pay

## 2021-11-29 ENCOUNTER — Ambulatory Visit (INDEPENDENT_AMBULATORY_CARE_PROVIDER_SITE_OTHER): Payer: Medicare Other | Admitting: *Deleted

## 2021-11-29 ENCOUNTER — Telehealth: Payer: Self-pay | Admitting: Physician Assistant

## 2021-11-29 DIAGNOSIS — Z0000 Encounter for general adult medical examination without abnormal findings: Secondary | ICD-10-CM

## 2021-11-29 NOTE — Progress Notes (Signed)
Subjective:   Christine Henderson is a 88 y.o. female who presents for Medicare Annual (Subsequent) preventive examination.  I connected with  Christine Henderson on 11/29/21 by a audio enabled telemedicine application and verified that I am speaking with the correct person using two identifiers.  Patient Location: Home  Provider Location: Office/Clinic  I discussed the limitations of evaluation and management by telemedicine. The patient expressed understanding and agreed to proceed.   Review of Systems    Defer to PCP Cardiac Risk Factors include: advanced age (>12mn, >>68women);dyslipidemia;hypertension     Objective:    There were no vitals filed for this visit. There is no height or weight on file to calculate BMI.     11/29/2021    3:55 PM 11/01/2021    9:52 AM 10/23/2020   12:38 AM 10/11/2020   11:42 AM 07/29/2020    3:31 PM 07/24/2020   12:38 AM 03/18/2019   10:25 AM  Advanced Directives  Does Patient Have a Medical Advance Directive? Yes Yes Yes Yes Yes Yes Yes  Type of AParamed[place]of [place] will [place] will [place] will [place]Living will Healthcare Power of [place]of [place] will  Does patient want to make changes to medical advance directive? No - Patient declined  No - Patient declined    No - Patient declined  Copy of HVistain Chart? No - copy requested  No - copy requested Yes - validated most recent copy scanned in chart (See row information) No - copy requested No - copy requested Yes - validated most recent copy scanned in chart (See row information)    Current Medications (verified) Outpatient Encounter Medications as of 11/29/2021  Medication Sig   albuterol (VENTOLIN HFA) 108 (90 Base) MCG/ACT inhaler Inhale 2 puffs into the lungs every 6 (six) hours as needed for wheezing or  shortness of breath. (Patient not taking: Reported on 11/01/2021)   Ascorbic Acid (VITAMIN C) 1000 MG tablet Take 1,000 mg by mouth daily.   Calcium Carbonate-Vitamin D (CALCIUM PLUS VITAMIN D PO) Take 1,200 tablets by mouth daily.   donepezil (ARICEPT) 10 MG tablet Start Donepezil 10 mg :Take half tablet (5 mg) daily for 2 weeks, then 1 full tablet at 10 mg daily.   fish oil-omega-3 fatty acids 1000 MG capsule Take 2 g by mouth daily.   Glucosamine-Chondroit-Vit C-Mn (GLUCOSAMINE 1500 COMPLEX) CAPS Take 1,500 capsules by mouth 2 (two) times daily.   losartan (COZAAR) 25 MG tablet TAKE 1/2 TO 1 TABLET BY  MOUTH DAILY   Multiple Vitamin (MULTIVITAMIN) tablet Take 1 tablet by mouth daily.   neomycin-polymyxin-hydrocortisone (CORTISPORIN) OTIC solution Place 4 drops into the right ear 3 (three) times daily. (Patient not taking: Reported on 11/01/2021)   simvastatin (ZOCOR) 10 MG tablet TAKE 1 TABLET BY MOUTH AT  BEDTIME (Patient not taking: Reported on 11/01/2021)   No facility-administered encounter medications on file as of 11/29/2021.    Allergies (verified) Codeine   History: Past Medical History:  Diagnosis Date   Abdominal pain, unspecified site 06/26/2008   ALLERGIC RHINITIS CAUSE UNSPECIFIED 02/02/2008   Annual physical exam 12/26/2012   Diarrhea 06/26/2008   Fibrocystic breast disease    Headache(784.0) 10/01/2006   Hereditary and idiopathic peripheral neuropathy 01/10/2016   HYPERLIPIDEMIA 10/01/2006   HYPERTENSION 10/06/2007   IBS (irritable bowel syndrome)    Insomnia 11/14/2012  Irritable bowel syndrome 09/10/2006   Medicare annual wellness visit, subsequent 12/26/2012   Sees Dr Elinor Parkinson of opthamology.  MGM 5/14      Metatarsal stress fracture 01/20/2016   MIGRAINE HEADACHE 09/10/2006   MVP (mitral valve prolapse)    Onychomycosis 07/09/2014   OSTEOPENIA 09/10/2006   Osteopenia 11/12/2012   Palpitations 09/10/2006   PHOTOPHOBIA 07/20/2009   SKIN TAG 11/09/2006   VAGINITIS,  ATROPHIC 10/01/2006   Vitamin D deficiency 07/14/2016   Past Surgical History:  Procedure Laterality Date   DENTAL SURGERY  09/2014   ROTATOR CUFF REPAIR Right    TONSILLECTOMY     Family History  Problem Relation Age of Onset   Inflammatory bowel disease Sister    Alzheimer's disease Sister    Pancreatic cancer Maternal Grandfather    Alzheimer's disease Mother    Hyperlipidemia Son    Obesity Son    Hyperlipidemia Son    Heart disease Paternal Uncle    Coronary artery disease Neg Hx    Social History   Socioeconomic History   Marital status: Widowed    Spouse name: Not on file   Number of children: 4   Years of education: Not on file   Highest education level: Not on file  Occupational History   Occupation: retired    Fish farm manager: RETIRED  Tobacco Use   Smoking status: Never   Smokeless tobacco: Never  Vaping Use   Vaping Use: Never used  Substance and Sexual Activity   Alcohol use: No   Drug use: No   Sexual activity: Never    Birth control/protection: None    Comment: regular exercise, lives at the [place], widowed in 2014  Other Topics Concern   Not on file  Social History Narrative   Left handed   Caffeine 3-4 glasses of tea   Lives alone and does her own medicine   Social Determinants of Health   Financial Resource Strain: [place]  (10/11/2020)   Overall Financial Resource Strain (CARDIA)    Difficulty of Paying Living Expenses: Not hard at all  Food Insecurity: No Food Insecurity (10/11/2020)   Hunger Vital Sign    Worried About Running Out of Food in the Last Year: Never true    [place] in the Last Year: Never true  Transportation Needs: No Transportation Needs (10/11/2020)   PRAPARE - Hydrologist (Medical): No    Lack of Transportation (Non-Medical): No  Physical Activity: Insufficiently Active (10/11/2020)   Exercise Vital Sign    Days of Exercise per Week: 4 days    Minutes of Exercise per Session: 30 min   Stress: No Stress Concern Present (10/11/2020)   [place]    Feeling of Stress : Not at all  Social Connections: Moderately Isolated (10/11/2020)   Social Connection and Isolation Panel [NHANES]    Frequency of Communication with Friends and Family: More than three times a week    Frequency of Social Gatherings with Friends and Family: More than three times a week    Attends Religious Services: More than 4 times per year    Active Member of [employer] or Organizations: No    Attends Archivist Meetings: Never    Marital Status: Widowed    Tobacco Counseling Counseling given: Not Answered   Clinical Intake:  Pre-visit preparation completed: Yes  Pain : No/denies pain  How often do you need to have  someone help you when you read instructions, pamphlets, or other written materials from your doctor or pharmacy?: 1 - Never  Diabetic? No  Activities of Daily Living    11/29/2021    3:56 PM  In your present state of health, do you have any difficulty performing the following activities:  Hearing? 0  Vision? 0  Difficulty concentrating or making decisions? 0  Walking or climbing stairs? 0  Dressing or bathing? 0  Doing errands, shopping? 0  Preparing Food and eating ? N  Using the Toilet? N  In the past six months, have you accidently leaked urine? Y  Do you have problems with loss of bowel control? N  Managing your Medications? N  Managing your Finances? N  Housekeeping or managing your Housekeeping? N    Patient Care Team: Mosie Lukes, MD as PCP - General (Family Medicine)  Indicate any recent Medical Services you may have received from other than [facility] providers in the past year (date may be approximate).     Assessment:   This is a routine wellness examination for [facility].  Hearing/Vision screen No results found.  Dietary issues and exercise activities discussed: Current Exercise Habits:  The patient does not participate in regular exercise at present, Exercise limited by: None identified   Goals Addressed   None    Depression Screen    11/29/2021    3:56 PM 10/24/2021    3:58 PM 10/25/2020   10:13 AM 10/11/2020   11:48 AM 08/02/2020    2:28 PM 05/01/2020    3:03 PM 03/18/2019   10:32 AM  PHQ 2/9 Scores  PHQ - 2 Score 0 0 2 0 2 0 0  PHQ- 9 Score  0 2  2      Fall Risk    11/29/2021    3:50 PM 11/01/2021    9:52 AM 10/24/2021    3:57 PM 10/25/2020   10:13 AM 10/11/2020   11:45 AM  Fall Risk   Falls in the past year? 1 1 0 0 0  Number falls in past yr: 0 1 0 0 0  Injury with Fall? 1 0 0 0 0  Risk for fall due to : History of fall(s)      Follow up Falls evaluation completed    Falls prevention discussed    FALL RISK PREVENTION PERTAINING TO THE HOME:  Any stairs in or around the home? No  If so, are there any without handrails?  No stairs Home free of loose throw rugs in walkways, pet beds, electrical cords, etc? Yes  Adequate lighting in your home to reduce risk of falls? Yes   ASSISTIVE DEVICES UTILIZED TO PREVENT FALLS:  Life alert? Yes  Use of a cane, walker or w/c? Yes  Grab bars in the bathroom? Yes  Shower chair or bench in shower? No  Elevated toilet seat or a handicapped toilet? No   TIMED UP AND GO:  Was the test performed? No . Audio visit  Cognitive Function:    11/01/2021   10:00 AM 01/13/2017   10:32 AM  MMSE - Mini Mental State Exam  Orientation to time 5 5  Orientation to Place 5 5  Registration 3 3  Attention/ Calculation 1 5  Recall 3 3  Language- name 2 objects 2 2  Language- repeat 0 1  Language- follow 3 step command 3 3  Language- read & follow direction 1 1  Write a sentence 0 1  Copy design 0  1  Total score 23 30        11/29/2021    4:08 PM  6CIT Screen  What Year? 0 points  What month? 0 points  What time? 0 points  Count back from 20 0 points  Months in reverse 0 points  Repeat phrase 2 points  Total Score 2  points    Immunizations Immunization History  Administered Date(s) Administered   Fluad Quad(high Dose 65+) 11/11/2018   Influenza Split 12/08/2011, 12/05/2015   Influenza Whole 12/11/2006, 12/14/2007, 12/26/2008, 11/06/2009, 11/11/2010   Influenza, High Dose Seasonal PF 01/08/2015, 12/04/2016, 11/04/2021   Influenza,inj,Quad PF,6+ Mos 11/12/2012, 12/30/2013   Influenza-Unspecified 12/01/2017, 01/25/2020   Moderna Sars-Covid-2 Vaccination 04/18/2019, 05/17/2019, 01/25/2020   Pneumococcal Conjugate-13 06/27/2013   Pneumococcal Polysaccharide-23 03/03/2002, 10/06/2007   Td 03/03/1997, 10/06/2007   Tdap 09/27/2014   Zoster Recombinat (Shingrix) 01/07/2017, 05/18/2017   Zoster, Live 03/03/2014    TDAP status: Up to date  Flu Vaccine status: Up to date  Pneumococcal vaccine status: Up to date  Covid-19 vaccine status: Information provided on how to obtain vaccines.   Qualifies for Shingles Vaccine? Yes   Zostavax completed Yes   Shingrix Completed?: Yes  Screening Tests Health Maintenance  Topic Date Due   DEXA SCAN  03/28/2021   COVID-19 Vaccine (4 - Moderna series) 02/28/2022 (Originally 03/21/2020)   MAMMOGRAM  06/26/2022   TETANUS/TDAP  09/26/2024   Pneumonia Vaccine 68+ Years old  Completed   INFLUENZA VACCINE  Completed   Zoster Vaccines- Shingrix  Completed   HPV VACCINES  Aged Out    Health Maintenance  Health Maintenance Due  Topic Date Due   DEXA SCAN  03/28/2021    Colorectal cancer screening: No longer required.   Mammogram status: Completed 06/25/21. Repeat every year  Bone Density status: Completed 03/29/19. Results reflect: Bone density results: OSTEOPENIA. Repeat every 2 years.  Lung Cancer Screening: (Low Dose CT Chest recommended if Age 71-80 years, 30 pack-year currently smoking OR have quit w/in 15years.) does not qualify.   Lung Cancer Screening Referral: N/a  Additional Screening:  Hepatitis C Screening: does not qualify; Completed  N/a  Vision Screening: Recommended annual ophthalmology exams for early detection of glaucoma and other disorders of the eye. Is the patient up to date with their annual eye exam?  Yes  Who is the provider or what is the name of the office in which the patient attends annual eye exams? [facility] If pt is not established with a provider, would they like to be referred to a provider to establish care? No .   Dental Screening: Recommended annual dental exams for proper oral hygiene  Community Resource Referral / Chronic Care Management: CRR required this visit?  No   CCM required this visit?  No      Plan:     I have personally reviewed and noted the following in the patient's chart:   Medical and social history Use of alcohol, tobacco or illicit drugs  Current medications and supplements including opioid prescriptions. Patient is not currently taking opioid prescriptions. Functional ability and status Nutritional status Physical activity Advanced directives List of other physicians Hospitalizations, surgeries, and ER visits in previous 12 months Vitals Screenings to include cognitive, depression, and falls Referrals and appointments  In addition, I have reviewed and discussed with patient certain preventive protocols, quality metrics, and best practice recommendations. A written personalized care plan for preventive services as well as general preventive health recommendations were provided to patient.  Due to this being a telephonic visit, the after visit summary with patients personalized plan was offered to patient via mail or my-chart. Patient was mailed a copy of AVS.  Beatris Ship, [place]   11/29/2021   Nurse Notes: None

## 2021-11-29 NOTE — Telephone Encounter (Signed)
Patient's son Remo Lipps called and said he received a call to schedule a CT scan but he was not aware his mom needed one. Please confirm.

## 2021-11-29 NOTE — Patient Instructions (Signed)
[name] , Thank you for taking time to come for your Medicare Wellness Visit. I appreciate your ongoing commitment to your health goals. Please review the following plan we discussed and let me know if I can assist you in the future.   These are the goals we discussed:  Goals       Increase physical activity      Begin walking again.       Maintain current health (pt-stated)      Patient Stated      Drink more water        This is a list of the screening recommended for you and due dates:  Health Maintenance  Topic Date Due   DEXA scan (bone density measurement)  03/28/2021   COVID-19 Vaccine (4 - Moderna series) 02/28/2022*   Mammogram  06/26/2022   Tetanus Vaccine  09/26/2024   Pneumonia Vaccine  Completed   Flu Shot  Completed   Zoster (Shingles) Vaccine  Completed   HPV Vaccine  Aged Out  *Topic was postponed. The date shown is not the original due date.     Next appointment: Follow up in one year for your annual wellness visit    Preventive Care 65 Years and Older, Female Preventive care refers to lifestyle choices and visits with your health care provider that can promote health and wellness. What does preventive care include? A yearly physical exam. This is also called an annual well check. Dental exams once or twice a year. Routine eye exams. Ask your health care provider how often you should have your eyes checked. Personal lifestyle choices, including: Daily care of your teeth and gums. Regular physical activity. Eating a healthy diet. Avoiding tobacco and drug use. Limiting alcohol use. Practicing safe sex. Taking low-dose aspirin every day. Taking vitamin and mineral supplements as recommended by your health care provider. What happens during an annual well check? The services and screenings done by your health care provider during your annual well check will depend on your age, overall health, lifestyle risk factors, and family history of  disease. Counseling  Your health care provider may ask you questions about your: Alcohol use. Tobacco use. Drug use. Emotional well-being. Home and relationship well-being. Sexual activity. Eating habits. History of falls. Memory and ability to understand (cognition). Work and work Statistician. Reproductive health. Screening  You may have the following tests or measurements: Height, weight, and BMI. Blood pressure. Lipid and cholesterol levels. These may be checked every 5 years, or more frequently if you are over 64 years old. Skin check. Lung cancer screening. You may have this screening every year starting at age 24 if you have a 30-pack-year history of smoking and currently smoke or have quit within the past 15 years. Fecal occult blood test (FOBT) of the stool. You may have this test every year starting at age 65. Flexible sigmoidoscopy or colonoscopy. You may have a sigmoidoscopy every 5 years or a colonoscopy every 10 years starting at age 46. Hepatitis C blood test. Hepatitis B blood test. Sexually transmitted disease (STD) testing. Diabetes screening. This is done by checking your blood sugar (glucose) after you have not eaten for a while (fasting). You may have this done every 1-3 years. Bone density scan. This is done to screen for osteoporosis. You may have this done starting at age 66. Mammogram. This may be done every 1-2 years. Talk to your health care provider about how often you should have regular mammograms. Talk with your  health care provider about your test results, treatment options, and if necessary, the need for more tests. Vaccines  Your health care provider may recommend certain vaccines, such as: Influenza vaccine. This is recommended every year. Tetanus, diphtheria, and acellular pertussis (Tdap, Td) vaccine. You may need a Td booster every 10 years. Zoster vaccine. You may need this after age 32. Pneumococcal 13-valent conjugate (PCV13) vaccine. One  dose is recommended after age 68. Pneumococcal polysaccharide (PPSV23) vaccine. One dose is recommended after age 37. Talk to your health care provider about which screenings and vaccines you need and how often you need them. This information is not intended to replace advice given to you by your health care provider. Make sure you discuss any questions you have with your health care provider. Document Released: 03/16/2015 Document Revised: 11/07/2015 Document Reviewed: 12/19/2014 Elsevier Interactive Patient Education  2017 [place] Prevention in the Home Falls can cause injuries. They can happen to people of all ages. There are many things you can do to make your home safe and to help prevent falls. What can I do on the outside of my home? Regularly fix the edges of walkways and driveways and fix any cracks. Remove anything that might make you trip as you walk through a door, such as a raised step or threshold. Trim any bushes or trees on the path to your home. Use bright outdoor lighting. Clear any walking paths of anything that might make someone trip, such as rocks or tools. Regularly check to see if handrails are loose or broken. Make sure that both sides of any steps have handrails. Any raised decks and porches should have guardrails on the edges. Have any leaves, snow, or ice cleared regularly. Use sand or salt on walking paths during winter. Clean up any spills in your garage right away. This includes oil or grease spills. What can I do in the bathroom? Use night lights. Install grab bars by the toilet and in the tub and shower. Do not use towel bars as grab bars. Use non-skid mats or decals in the tub or shower. If you need to sit down in the shower, use a plastic, non-slip stool. Keep the floor dry. Clean up any water that spills on the floor as soon as it happens. Remove soap buildup in the tub or shower regularly. Attach bath mats securely with double-sided  non-slip rug tape. Do not have throw rugs and other things on the floor that can make you trip. What can I do in the bedroom? Use night lights. Make sure that you have a light by your bed that is easy to reach. Do not use any sheets or blankets that are too big for your bed. They should not hang down onto the floor. Have a firm chair that has side arms. You can use this for support while you get dressed. Do not have throw rugs and other things on the floor that can make you trip. What can I do in the kitchen? Clean up any spills right away. Avoid walking on wet floors. Keep items that you use a lot in easy-to-reach places. If you need to reach something above you, use a strong step stool that has a grab bar. Keep electrical cords out of the way. Do not use floor polish or wax that makes floors slippery. If you must use wax, use non-skid floor wax. Do not have throw rugs and other things on the floor that can make you trip. What  can I do with my stairs? Do not leave any items on the stairs. Make sure that there are handrails on both sides of the stairs and use them. Fix handrails that are broken or loose. Make sure that handrails are as long as the stairways. Check any carpeting to make sure that it is firmly attached to the stairs. Fix any carpet that is loose or worn. Avoid having throw rugs at the top or bottom of the stairs. If you do have throw rugs, attach them to the floor with carpet tape. Make sure that you have a light switch at the top of the stairs and the bottom of the stairs. If you do not have them, ask someone to add them for you. What else can I do to help prevent falls? Wear shoes that: Do not have high heels. Have rubber bottoms. Are comfortable and fit you well. Are closed at the toe. Do not wear sandals. If you use a stepladder: Make sure that it is fully opened. Do not climb a closed stepladder. Make sure that both sides of the stepladder are locked into place. Ask  someone to hold it for you, if possible. Clearly mark and make sure that you can see: Any grab bars or handrails. First and last steps. Where the edge of each step is. Use tools that help you move around (mobility aids) if they are needed. These include: Canes. Walkers. Scooters. Crutches. Turn on the lights when you go into a dark area. Replace any light bulbs as soon as they burn out. Set up your furniture so you have a clear path. Avoid moving your furniture around. If any of your floors are uneven, fix them. If there are any pets around you, be aware of where they are. Review your medicines with your doctor. Some medicines can make you feel dizzy. This can increase your chance of falling. Ask your doctor what other things that you can do to help prevent falls. This information is not intended to replace advice given to you by your health care provider. Make sure you discuss any questions you have with your health care provider. Document Released: 12/14/2008 Document Revised: 07/26/2015 Document Reviewed: 03/24/2014 Elsevier Interactive Patient Education  2017 [employer].

## 2021-11-29 NOTE — Telephone Encounter (Signed)
Advised to son, not needed per Dwyane Luo

## 2021-12-04 ENCOUNTER — Ambulatory Visit: Payer: Medicare Other | Admitting: Physician Assistant

## 2021-12-04 ENCOUNTER — Encounter: Payer: Self-pay | Admitting: Physician Assistant

## 2021-12-04 VITALS — BP 139/98 | HR 78 | Resp 20 | Ht 64.0 in | Wt 116.0 lb

## 2021-12-04 DIAGNOSIS — R413 Other amnesia: Secondary | ICD-10-CM | POA: Diagnosis not present

## 2021-12-04 DIAGNOSIS — F0280 Dementia in other diseases classified elsewhere without behavioral disturbance: Secondary | ICD-10-CM

## 2021-12-04 DIAGNOSIS — G309 Alzheimer's disease, unspecified: Secondary | ICD-10-CM

## 2021-12-04 DIAGNOSIS — F0150 Vascular dementia without behavioral disturbance: Secondary | ICD-10-CM | POA: Diagnosis not present

## 2021-12-04 NOTE — Patient Instructions (Addendum)
It was a pleasure to see you today at our office.   Recommendations:  Follow up in 6 months  Continue  Donepezil 10 mg : take one tab  10 mg daily. Side effects discussed   Recommend good control of cardiovascular risk factors.    Schedule Neuropsych testing for clarity of diagnosis and disease trajectory       Whom to call:  Memory  decline, memory medications: Call our office [phone]   For psychiatric meds, mood meds: Please have your primary care physician manage these medications.   Counseling regarding caregiver distress, including caregiver depression, anxiety and issues regarding community resources, adult day care programs, adult living facilities, or memory care questions:   Feel free to contact [place], Social Worker at [phone]   For assessment of decision of mental capacity and competency:  Call Dr. Anthoney Harada, geriatric psychiatrist at [phone]  For guidance in geriatric dementia issues please call [facility] [phone]  For guidance regarding [facility] and if placement were needed at the facility, contact Arnell Asal, Social Worker tel: [phone]  If you have any severe symptoms of a stroke, or other severe issues such as confusion,severe chills or fever, etc call 911 or go to the ER as you may need to be evaluated further   Feel free to visit Facebook page " [email or web address]" for tips of how to care for people with memory problems.       RECOMMENDATIONS FOR ALL PATIENTS WITH MEMORY PROBLEMS: 1. Continue to exercise (Recommend 30 minutes of walking everyday, or 3 hours every week) 2. Increase social interactions - continue going to [place] and enjoy social gatherings with friends and family 3. Eat healthy, avoid fried foods and eat more fruits and vegetables 4. Maintain adequate blood pressure, blood sugar, and blood cholesterol level. Reducing the risk of stroke and cardiovascular disease also helps  promoting better memory. 5. Avoid stressful situations. Live a simple life and avoid aggravations. Organize your time and prepare for the next day in anticipation. 6. Sleep well, avoid any interruptions of sleep and avoid any distractions in the bedroom that may interfere with adequate sleep quality 7. Avoid sugar, avoid sweets as there is a strong link between excessive sugar intake, diabetes, and cognitive impairment We discussed the Mediterranean diet, which has been shown to help patients reduce the risk of progressive memory disorders and reduces cardiovascular risk. This includes eating fish, eat fruits and green leafy vegetables, nuts like almonds and hazelnuts, walnuts, and also use olive oil. Avoid fast foods and fried foods as much as possible. Avoid sweets and sugar as sugar use has been linked to worsening of memory function.  There is always a concern of gradual progression of memory problems. If this is the case, then we may need to adjust level of care according to patient needs. Support, both to the patient and caregiver, should then be put into place.    The Alzheimer's Association is here all day, every day for people facing Alzheimer's disease through our free 24/7 Helpline: [phone]. The Helpline provides reliable information and support to all those who need assistance, such as individuals living with memory loss, Alzheimer's or other dementia, caregivers, health care professionals and the public.  Our highly trained and knowledgeable staff can help you with: Understanding memory loss, dementia and Alzheimer's  Medications and other treatment options  General information about aging and brain health  Skills to provide quality care and to find the best  care from professionals  Legal, financial and living-arrangement decisions Our Helpline also features: Confidential care consultation provided by master's level clinicians who can help with decision-making support, crisis  assistance and education on issues families face every day  Help in a caller's preferred language using our translation service that features more than 200 languages and dialects  Referrals to local community programs, services and ongoing support     FALL PRECAUTIONS: Be cautious when walking. Scan the area for obstacles that may increase the risk of trips and falls. When getting up in the mornings, sit up at the edge of the bed for a few minutes before getting out of bed. Consider elevating the bed at the head end to avoid drop of blood pressure when getting up. Walk always in a well-lit room (use night lights in the walls). Avoid area rugs or power cords from appliances in the middle of the walkways. Use a walker or a cane if necessary and consider physical therapy for balance exercise. Get your eyesight checked regularly.  FINANCIAL OVERSIGHT: Supervision, especially oversight when making financial decisions or transactions is also recommended.  HOME SAFETY: Consider the safety of the kitchen when operating appliances like stoves, microwave oven, and blender. Consider having supervision and share cooking responsibilities until no longer able to participate in those. Accidents with firearms and other hazards in the house should be identified and addressed as well.   ABILITY TO BE LEFT ALONE: If patient is unable to contact 911 operator, consider using LifeLine, or when the need is there, arrange for someone to stay with patients. Smoking is a fire hazard, consider supervision or cessation. Risk of wandering should be assessed by caregiver and if detected at any point, supervision and safe proof recommendations should be instituted.  MEDICATION SUPERVISION: Inability to self-administer medication needs to be constantly addressed. Implement a mechanism to ensure safe administration of the medications.   DRIVING: Regarding driving, in patients with progressive memory problems, driving will be  impaired. We advise to have someone else do the driving if trouble finding directions or if minor accidents are reported. Independent driving assessment is available to determine safety of driving.   If you are interested in the driving assessment, you can contact the following:  [employer] in [place]  [place] [phone] or [phone]      [place] refers to food and lifestyle choices that are based on the traditions of countries located on the [employer]. This way of eating has been shown to help prevent certain conditions and improve outcomes for people who have chronic diseases, like kidney disease and heart disease. What are tips for following this plan? Lifestyle  Cook and eat meals together with your family, when possible. Drink enough fluid to keep your urine clear or pale yellow. Be physically active every day. This includes: Aerobic exercise like running or swimming. Leisure activities like gardening, walking, or housework. Get 7-8 hours of sleep each night. If recommended by your health care provider, drink red wine in moderation. This means 1 glass a day for nonpregnant women and 2 glasses a day for men. A glass of wine equals 5 oz (150 mL). Reading food labels  Check the serving size of packaged foods. For foods such as rice and pasta, the serving size refers to the amount of cooked product, not dry. Check the total fat in packaged foods. Avoid foods that have  saturated fat or trans fats. Check the ingredients list for added sugars, such as corn syrup. Shopping  At the grocery store, buy most of your food from the areas near the walls of the store. This includes: Fresh fruits and vegetables (produce). Grains, beans, nuts, and seeds. Some of these may be available in unpackaged forms or large amounts (in  bulk). Fresh seafood. Poultry and eggs. Low-fat dairy products. Buy whole ingredients instead of prepackaged foods. Buy fresh fruits and vegetables in-season from local farmers markets. Buy frozen fruits and vegetables in resealable bags. If you do not have access to quality fresh seafood, buy precooked frozen shrimp or canned fish, such as tuna, salmon, or sardines. Buy small amounts of raw or cooked vegetables, salads, or olives from the deli or salad bar at your store. Stock your pantry so you always have certain foods on hand, such as olive oil, canned tuna, canned tomatoes, rice, pasta, and beans. Cooking  Cook foods with extra-virgin olive oil instead of using butter or other vegetable oils. Have meat as a side dish, and have vegetables or grains as your main dish. This means having meat in small portions or adding small amounts of meat to foods like pasta or stew. Use beans or vegetables instead of meat in common dishes like chili or lasagna. Experiment with different cooking methods. Try roasting or broiling vegetables instead of steaming or sauteing them. Add frozen vegetables to soups, stews, pasta, or rice. Add nuts or seeds for added healthy fat at each meal. You can add these to yogurt, salads, or vegetable dishes. Marinate fish or vegetables using olive oil, lemon juice, garlic, and fresh herbs. Meal planning  Plan to eat 1 vegetarian meal one day each week. Try to work up to 2 vegetarian meals, if possible. Eat seafood 2 or more times a week. Have healthy snacks readily available, such as: Vegetable sticks with hummus. Greek yogurt. Fruit and nut trail mix. Eat balanced meals throughout the week. This includes: Fruit: 2-3 servings a day Vegetables: 4-5 servings a day Low-fat dairy: 2 servings a day Fish, poultry, or lean meat: 1 serving a day Beans and legumes: 2 or more servings a week Nuts and seeds: 1-2 servings a day Whole grains: 6-8 servings a day Extra-virgin  olive oil: 3-4 servings a day Limit red meat and sweets to only a few servings a month What are my food choices? Mediterranean diet Recommended Grains: Whole-grain pasta. Brown rice. Bulgar wheat. Polenta. Couscous. Whole-wheat bread. [place]. Vegetables: Artichokes. Beets. Broccoli. Cabbage. Carrots. Eggplant. Green beans. Chard. Kale. Spinach. Onions. Leeks. Peas. Squash. Tomatoes. Peppers. Radishes. Fruits: Apples. Apricots. Avocado. Berries. Bananas. Cherries. Dates. Figs. Grapes. Lemons. Melon. Oranges. Peaches. Plums. Pomegranate. Meats and other protein foods: Beans. Almonds. Sunflower seeds. Pine nuts. Peanuts. [place]. Salmon. Scallops. Shrimp. [place]. Tilapia. Clams. Oysters. Eggs. Dairy: Low-fat milk. Cheese. Greek yogurt. Beverages: Water. Red wine. Herbal tea. Fats and oils: Extra virgin olive oil. Avocado oil. Grape seed oil. Sweets and desserts: [place] yogurt with honey. Baked apples. Poached pears. Trail mix. Seasoning and other foods: Basil. Cilantro. Coriander. Cumin. Mint. Parsley. Sage. Rosemary. Tarragon. Garlic. Oregano. Thyme. Pepper. Balsalmic vinegar. Tahini. Hummus. Tomato sauce. Olives. Mushrooms. Limit these Grains: Prepackaged pasta or rice dishes. Prepackaged cereal with added sugar. Vegetables: Deep fried potatoes (french fries). Fruits: Fruit canned in syrup. Meats and other protein foods: Beef. Pork. Lamb. Poultry with skin. Hot dogs. [place] Salines. Dairy: Ice cream. Sour cream. Whole milk. Beverages: Juice. Sugar-sweetened soft drinks. Beer. Liquor  and spirits. Fats and oils: Butter. Canola oil. Vegetable oil. Beef fat (tallow). Lard. Sweets and desserts: Cookies. Cakes. Pies. Candy. Seasoning and other foods: Mayonnaise. Premade sauces and marinades. The items listed may not be a complete list. Talk with your dietitian about what dietary choices are right for you. Summary The Mediterranean diet includes both food and lifestyle choices. Eat a variety of fresh  fruits and vegetables, beans, nuts, seeds, and whole grains. Limit the amount of red meat and sweets that you eat. Talk with your health care provider about whether it is safe for you to drink red wine in moderation. This means 1 glass a day for nonpregnant women and 2 glasses a day for men. A glass of wine equals 5 oz (150 mL). This information is not intended to replace advice given to you by your health care provider. Make sure you discuss any questions you have with your health care provider. Document Released: 10/11/2015 Document Revised: 11/13/2015 Document Reviewed: 10/11/2015 Elsevier Interactive Patient Education  2017 [employer].    We have sent a referral to [facility] for your MRI and they will call you directly to schedule your appointment. They are located at [place]. If you need to contact them directly please call [phone].

## 2021-12-04 NOTE — Progress Notes (Signed)
Assessment/Plan:   Mixed Vascular and Alzheimer's Dementia, moderate, late onset  Christine Henderson is a very pleasant 88 y.o. 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 Patient is currently on donepezil 10 mg daily, tolerating well. MMSE on 11/01/21 was 23/30      Follow up in  6 months. Continue donepezil 10 mg daily. Side effects were discussed  Recommend good control of cardiovascular risk factors.   Continue to control mood as per PCP       Subjective:    This patient is accompanied in the office by her son  who supplements the history.  Previous records as well as any outside records available were reviewed prior to todays visit. Last seen on 11/01/21 at which time MMSE was 23/30    Any changes in memory since last visit? Denies Tolerating meds? Got diarrhea occasionally, but overall tolerating the med.     Initial Visit 11/01/21 How long did patient have memory difficulties?  Over the last 6 months she had difficulty with names of people.  However, prior to that, he had noticed that the patient had shown some signs of to 4 years ago.  "Last night she went to eat and could not make a decision ".  She enjoys reading the newspaper daily, but cannot do any crossword puzzles or word finding.  Short-term memory is worse than long-term memory.  Patient lives with:  Patient lives alone repeats oneself?  Occasionally "not too often" Disoriented when walking into a room?  Patient denies    Leaving objects in unusual places?  Patient denies   Ambulates  with difficulty?   Patient denies, although "she has more balance issues than prior "-her son says Recent falls?  About 2 months ago, she had a mechanical fall in the bathtub, without loss of consciousness.  She did not seek medical attention.   Any head injuries?  Patient denies   History of seizures?   Patient denies   Wandering behavior?  Patient denies   Patient drives? Short distances only.  About 2 years ago, the patient had one episode in which he got lost, without recurrence.    Any mood changes?  Patient denies   Any history of depression?:  Patient denies   Hallucinations?  Patient denies   Paranoia?  Patient denies   Patient reports that sleeps well without vivid dreams, REM behavior or sleepwalking . Takes melatonin prn  History of sleep apnea?  Patient denies   Any hygiene concerns?  Patient denies   Independent of bathing and dressing?  Endorsed  Does the patient needs help with medications?  Patient denies   Who is in charge of the finances?  Patient is in charge  with son's supervision  Any changes in appetite?  Does not eat as much "food does not taste as good"  Patient have trouble swallowing? Patient denies   Does the patient cook?  Patient denies   Any kitchen accidents such as leaving the stove on? Patient denies   Any headaches?  Patient denies   Double vision? Patient denies   Any focal numbness or  tingling?  Patient denies   Chronic back pain Patient denies   Unilateral weakness?  Patient denies   Any tremors?  Patient denies   Any history of anosmia?  Patient denies   Any incontinence of urine?  Patient denies   Any bowel dysfunction?   Patient denies   History of heavy alcohol intake?  Patient denies   History of heavy tobacco use?  Patient denies   Family history of dementia?     Mother, Father, and Sister  had  Alzheimer's disease    MRI brain 11/17/21 personally reviewed  was remarkable for  moderate chronic small vessel ischemic changes within the cerebral white matter. 4. Fairly numerous chronic microhemorrhages within the supratentorial brain, greatest within the bilateral occipital lobes suspicious for sequelae of cerebral amyloid angiopathy, chronic blood products from remote subarachnoid hemorrhage, moderate generalized cerebral atrophy.   Pertinent labs : 10/29/2021: TSH 5.25, A1c 6.1, total cholesterol 218, LDL 125  CURRENT MEDICATIONS:  Outpatient Encounter Medications as of 12/04/2021  Medication Sig   albuterol (VENTOLIN HFA) 108 (90 Base) MCG/ACT inhaler Inhale 2 puffs into the lungs every 6 (six) hours as needed for wheezing or shortness of breath. (Patient not taking: Reported on 11/01/2021)   Ascorbic Acid (VITAMIN C) 1000 MG tablet Take 1,000 mg by mouth daily.   Calcium Carbonate-Vitamin D (CALCIUM PLUS VITAMIN D PO) Take 1,200 tablets by mouth daily.   donepezil (ARICEPT) 10 MG tablet Start Donepezil 10 mg :Take half tablet (5 mg) daily for 2 weeks, then 1 full tablet at 10 mg daily.   fish oil-omega-3 fatty acids 1000 MG capsule Take 2 g by mouth daily.   Glucosamine-Chondroit-Vit C-Mn (GLUCOSAMINE 1500 COMPLEX) CAPS Take 1,500 capsules by mouth 2 (two) times daily.   losartan (COZAAR) 25 MG tablet TAKE 1/2 TO 1 TABLET BY  MOUTH DAILY   Multiple Vitamin (MULTIVITAMIN) tablet Take 1 tablet by mouth daily.   neomycin-polymyxin-hydrocortisone (CORTISPORIN) OTIC solution Place 4 drops into the right ear 3 (three) times daily. (Patient not taking: Reported on 11/01/2021)   simvastatin (ZOCOR) 10 MG tablet TAKE 1 TABLET BY MOUTH AT  BEDTIME (Patient not taking: Reported on 11/01/2021)   No facility-administered encounter medications on file as of 12/04/2021.       11/01/2021   10:00 AM 01/13/2017   10:32 AM  MMSE - Mini Mental State Exam  Orientation to time 5 5  Orientation to Place 5 5  Registration 3 3  Attention/ Calculation 1 5  Recall 3 3  Language- name 2 objects  2 2  Language- repeat 0 1  Language- follow 3 step command 3 3  Language- read & follow direction 1 1  Write a sentence 0 1  Copy design 0 1  Total score 23 30       No data to display           Thank you for allowing [place] the opportunity to participate in the care of this nice patient. Please do not hesitate to contact us for any questions or concerns.   Total time spent on today's visit was 30 minutes dedicated to this patient today, preparing to see patient, examining the patient, ordering tests and/or medications and counseling the patient, documenting clinical information in the EHR or other health record, independently interpreting results and communicating results to the patient/family, discussing treatment and goals, answering patient's questions and coordinating care.  Cc:  Mosie Lukes, MD  Sharene Butters 12/04/2021 7:04 AM

## 2021-12-09 ENCOUNTER — Encounter: Payer: Self-pay | Admitting: Family Medicine

## 2021-12-10 ENCOUNTER — Other Ambulatory Visit: Payer: Self-pay | Admitting: Family Medicine

## 2021-12-10 DIAGNOSIS — E785 Hyperlipidemia, unspecified: Secondary | ICD-10-CM

## 2021-12-10 MED ORDER — SIMVASTATIN 10 MG PO TABS: 10.0000 mg | ORAL_TABLET | Freq: Every day | ORAL | 3 refills | Status: DC

## 2022-01-03 ENCOUNTER — Other Ambulatory Visit: Payer: Self-pay | Admitting: Family Medicine

## 2022-01-03 DIAGNOSIS — E785 Hyperlipidemia, unspecified: Secondary | ICD-10-CM

## 2022-01-11 ENCOUNTER — Encounter: Payer: Self-pay | Admitting: Physician Assistant

## 2022-01-13 ENCOUNTER — Other Ambulatory Visit: Payer: Self-pay

## 2022-01-13 MED ORDER — DONEPEZIL HCL 10 MG PO TABS: ORAL_TABLET | ORAL | 1 refills | Status: DC

## 2022-01-27 NOTE — Assessment & Plan Note (Signed)
Well controlled, no changes to meds. Encouraged heart healthy diet such as the DASH diet and exercise as tolerated.  °

## 2022-01-27 NOTE — Assessment & Plan Note (Signed)
Supplement and monitor 

## 2022-01-27 NOTE — Assessment & Plan Note (Signed)
Encourage heart healthy diet such as MIND or DASH diet, increase exercise, avoid trans fats, simple carbohydrates and processed foods, consider a krill or fish or flaxseed oil cap daily. Tolerating Simvastatin 

## 2022-01-27 NOTE — Assessment & Plan Note (Signed)
hgba1c acceptable, minimize simple carbs. Increase exercise as tolerated.  

## 2022-01-28 ENCOUNTER — Ambulatory Visit (INDEPENDENT_AMBULATORY_CARE_PROVIDER_SITE_OTHER): Payer: Medicare Other | Admitting: Family Medicine

## 2022-01-28 VITALS — BP 120/80 | HR 88 | Temp 97.7°F | Resp 16 | Ht 62.0 in | Wt 114.4 lb

## 2022-01-28 DIAGNOSIS — R413 Other amnesia: Secondary | ICD-10-CM

## 2022-01-28 DIAGNOSIS — Z780 Asymptomatic menopausal state: Secondary | ICD-10-CM

## 2022-01-28 DIAGNOSIS — R296 Repeated falls: Secondary | ICD-10-CM

## 2022-01-28 DIAGNOSIS — E782 Mixed hyperlipidemia: Secondary | ICD-10-CM

## 2022-01-28 DIAGNOSIS — M8580 Other specified disorders of bone density and structure, unspecified site: Secondary | ICD-10-CM

## 2022-01-28 DIAGNOSIS — J300 Vasomotor rhinitis: Secondary | ICD-10-CM

## 2022-01-28 DIAGNOSIS — E559 Vitamin D deficiency, unspecified: Secondary | ICD-10-CM | POA: Diagnosis not present

## 2022-01-28 DIAGNOSIS — I10 Essential (primary) hypertension: Secondary | ICD-10-CM | POA: Diagnosis not present

## 2022-01-28 DIAGNOSIS — R739 Hyperglycemia, unspecified: Secondary | ICD-10-CM

## 2022-01-28 MED ORDER — AZELASTINE HCL 0.1 % NA SOLN: 2.0000 | Freq: Two times a day (BID) | NASAL | 3 refills | Status: DC

## 2022-01-28 NOTE — Progress Notes (Signed)
Subjective:   By signing my name below, I, Kellie Simmering, attest that this documentation has been prepared under the direction and in the presence of Mosie Lukes, MD., 01/28/2022.   Patient ID: Christine Henderson, female    DOB: 09-Sep-1930, 88 y.o.   MRN: [id number]  No chief complaint on file.  HPI Patient is in today for an office visit. She is accompanied by a friend from church. Denies CP/ palp/ SOB/ HA/ congestion/fevers/GU c/o.  Hearing Loss She reports that she cannot hear properly out of her right ear. She states that she experiences occasional itching and pain in this ear.   Falls Patient has had multiple recent falls. She recently visited her sister and fell while using the bathroom at night. She reports that she has also been falling frequently at home. She stumbles but denies dizziness/loss of consciousness. She is using an assisted walking device.   Memory Loss She is currently taking Donepezil 10 mg to manage her memory loss. She has been experiencing a side effect of diarrhea, but this has subsided slightly.  Vasomotor Rhinitis Patient complains of constant rhinorrhea which produces clear nasal mucus. She is interested in using a nasal spray to manage this.   Past Medical History:  Diagnosis Date   Abdominal pain, unspecified site 06/26/2008   ALLERGIC RHINITIS CAUSE UNSPECIFIED 02/02/2008   Annual physical exam 12/26/2012   Diarrhea 06/26/2008   Fibrocystic breast disease    Headache(784.0) 10/01/2006   Hereditary and idiopathic peripheral neuropathy 01/10/2016   HYPERLIPIDEMIA 10/01/2006   HYPERTENSION 10/06/2007   IBS (irritable bowel syndrome)    Insomnia 11/14/2012   Irritable bowel syndrome 09/10/2006   Medicare annual wellness visit, subsequent 12/26/2012   Sees Dr Elinor Parkinson of opthamology.  MGM 5/14      Metatarsal stress fracture 01/20/2016   MIGRAINE HEADACHE 09/10/2006   MVP (mitral valve prolapse)    Onychomycosis 07/09/2014   OSTEOPENIA 09/10/2006    Osteopenia 11/12/2012   Palpitations 09/10/2006   PHOTOPHOBIA 07/20/2009   SKIN TAG 11/09/2006   VAGINITIS, ATROPHIC 10/01/2006   Vitamin D deficiency 07/14/2016   Past Surgical History:  Procedure Laterality Date   DENTAL SURGERY  09/2014   ROTATOR CUFF REPAIR Right    TONSILLECTOMY     Family History  Problem Relation Age of Onset   Inflammatory bowel disease Sister    Alzheimer's disease Sister    Pancreatic cancer Maternal Grandfather    Alzheimer's disease Mother    Hyperlipidemia Son    Obesity Son    Hyperlipidemia Son    Heart disease Paternal Uncle    Coronary artery disease Neg Hx    Social History   Socioeconomic History   Marital status: Widowed    Spouse name: Not on file   Number of children: 4   Years of education: Not on file   Highest education level: Not on file  Occupational History   Occupation: retired    Fish farm manager: RETIRED  Tobacco Use   Smoking status: Never   Smokeless tobacco: Never  Vaping Use   Vaping Use: Never used  Substance and Sexual Activity   Alcohol use: No   Drug use: No   Sexual activity: Never    Birth control/protection: None    Comment: regular exercise, lives at the [place], widowed in 2014  Other Topics Concern   Not on file  Social History Narrative   Left handed   Caffeine 3-4 glasses of tea   Lives alone and  does her own medicine   Social Determinants of Health   Financial Resource Strain: Low Risk  (10/11/2020)   Overall Financial Resource Strain (CARDIA)    Difficulty of Paying Living Expenses: Not hard at all  Food Insecurity: No Food Insecurity (10/11/2020)   Hunger Vital Sign    Worried About Running Out of Food in the Last Year: Never true    Ran Out of Food in the Last Year: Never true  Transportation Needs: No Transportation Needs (10/11/2020)   PRAPARE - Hydrologist (Medical): No    Lack of Transportation (Non-Medical): No  Physical Activity: Insufficiently Active (10/11/2020)    Exercise Vital Sign    Days of Exercise per Week: 4 days    Minutes of Exercise per Session: 30 min  Stress: No Stress Concern Present (10/11/2020)   [place]    Feeling of Stress : Not at all  Social Connections: Moderately Isolated (10/11/2020)   Social Connection and Isolation Panel [NHANES]    Frequency of Communication with Friends and Family: More than three times a week    Frequency of Social Gatherings with Friends and Family: More than three times a week    Attends Religious Services: More than 4 times per year    Active Member of [employer] or Organizations: No    Attends Archivist Meetings: Never    Marital Status: Widowed  Intimate Partner Violence: Not At Risk (10/11/2020)   Humiliation, Afraid, Rape, and Kick questionnaire    Fear of Current or Ex-Partner: No    Emotionally Abused: No    Physically Abused: No    Sexually Abused: No   Outpatient Medications Prior to Visit  Medication Sig Dispense Refill   albuterol (VENTOLIN HFA) 108 (90 Base) MCG/ACT inhaler Inhale 2 puffs into the lungs every 6 (six) hours as needed for wheezing or shortness of breath. (Patient not taking: Reported on 11/01/2021) 8 g 0   Ascorbic Acid (VITAMIN C) 1000 MG tablet Take 1,000 mg by mouth daily.     Calcium Carbonate-Vitamin D (CALCIUM PLUS VITAMIN D PO) Take 1,200 tablets by mouth daily.     donepezil (ARICEPT) 10 MG tablet Take 10 mg daily. 90 tablet 1   fish oil-omega-3 fatty acids 1000 MG capsule Take 2 g by mouth daily.     Glucosamine-Chondroit-Vit C-Mn (GLUCOSAMINE 1500 COMPLEX) CAPS Take 1,500 capsules by mouth 2 (two) times daily.     losartan (COZAAR) 25 MG tablet TAKE 1/2 TO 1 TABLET BY  MOUTH DAILY 90 tablet 3   Multiple Vitamin (MULTIVITAMIN) tablet Take 1 tablet by mouth daily.     simvastatin (ZOCOR) 10 MG tablet TAKE 1 TABLET BY MOUTH AT  BEDTIME 90 tablet 3   neomycin-polymyxin-hydrocortisone  (CORTISPORIN) OTIC solution Place 4 drops into the right ear 3 (three) times daily. (Patient not taking: Reported on 11/01/2021) 10 mL 0   No facility-administered medications prior to visit.   Allergies  Allergen Reactions   Codeine     REACTION: Nausea  Vomiting   Review of Systems  Constitutional:  Negative for chills and fever.  HENT:  Positive for hearing loss (right ear). Negative for congestion.   Respiratory:  Negative for shortness of breath.   Cardiovascular:  Negative for chest pain and palpitations.  Gastrointestinal:  Negative for abdominal pain, blood in stool, constipation, diarrhea, nausea and vomiting.  Genitourinary:  Negative for dysuria, frequency, hematuria and urgency.  Musculoskeletal:  Positive for falls.  Skin:           Neurological:  Negative for dizziness and headaches.      Objective:    Physical Exam Exam conducted with a chaperone present.  Constitutional:      General: She is not in acute distress.    Appearance: Normal appearance. She is normal weight. She is not ill-appearing.  HENT:     Head: Normocephalic and atraumatic.     Right Ear: Tympanic membrane, ear canal and external ear normal. Decreased hearing noted. No laceration, swelling or tenderness. No middle ear effusion. There is no impacted cerumen. No foreign body.     Left Ear: Hearing, tympanic membrane, ear canal and external ear normal.     Nose: Nose normal.     Mouth/Throat:     Mouth: Mucous membranes are moist.     Pharynx: Oropharynx is clear.  Eyes:     General:        Right eye: No discharge.        Left eye: No discharge.     Extraocular Movements: Extraocular movements intact.     Pupils: Pupils are equal, round, and reactive to light.  Cardiovascular:     Rate and Rhythm: Normal rate and regular rhythm.     Pulses: Normal pulses.     Heart sounds: Normal heart sounds. No murmur heard.    No gallop.  Pulmonary:     Effort: Pulmonary effort is normal. No respiratory  distress.     Breath sounds: Normal breath sounds. No wheezing or rales.  Abdominal:     General: Bowel sounds are normal.     Palpations: Abdomen is soft.     Tenderness: There is no abdominal tenderness. There is no guarding.  Musculoskeletal:        General: Normal range of motion.     Right lower leg: No edema.     Left lower leg: No edema.  Skin:    General: Skin is warm and dry.  Neurological:     Mental Status: She is alert and oriented to person, place, and time.  Psychiatric:        Mood and Affect: Mood normal.        Behavior: Behavior normal.        Judgment: Judgment normal.    There were no vitals taken for this visit. Wt Readings from Last 3 Encounters:  12/04/21 116 lb (52.6 kg)  11/01/21 118 lb (53.5 kg)  10/24/21 120 lb 6.4 oz (54.6 kg)   Diabetic Foot Exam - Simple   No data filed    Lab Results  Component Value Date   WBC 6.7 10/29/2021   HGB 12.6 10/29/2021   HCT 37.4 10/29/2021   PLT 262.0 10/29/2021   GLUCOSE 108 (H) 10/29/2021   CHOL 214 (H) 10/29/2021   TRIG 121.0 10/29/2021   HDL 64.70 10/29/2021   LDLCALC 125 (H) 10/29/2021   ALT 18 10/29/2021   AST 26 10/29/2021   NA 141 10/29/2021   K 3.9 10/29/2021   CL 103 10/29/2021   CREATININE 1.04 10/29/2021   BUN 29 (H) 10/29/2021   CO2 28 10/29/2021   TSH 5.25 10/29/2021   HGBA1C 6.1 10/29/2021   Lab Results  Component Value Date   TSH 5.25 10/29/2021   Lab Results  Component Value Date   WBC 6.7 10/29/2021   HGB 12.6 10/29/2021   HCT 37.4 10/29/2021   MCV 97.1 10/29/2021  PLT 262.0 10/29/2021   Lab Results  Component Value Date   NA 141 10/29/2021   K 3.9 10/29/2021   CO2 28 10/29/2021   GLUCOSE 108 (H) 10/29/2021   BUN 29 (H) 10/29/2021   CREATININE 1.04 10/29/2021   BILITOT 1.1 10/29/2021   ALKPHOS 44 10/29/2021   AST 26 10/29/2021   ALT 18 10/29/2021   PROT 7.2 10/29/2021   ALBUMIN 4.2 10/29/2021   CALCIUM 9.5 10/29/2021   ANIONGAP 8 10/23/2020   GFR 47.62 (L)  10/29/2021   Lab Results  Component Value Date   CHOL 214 (H) 10/29/2021   Lab Results  Component Value Date   HDL 64.70 10/29/2021   Lab Results  Component Value Date   LDLCALC 125 (H) 10/29/2021   Lab Results  Component Value Date   TRIG 121.0 10/29/2021   Lab Results  Component Value Date   CHOLHDL 3 10/29/2021   Lab Results  Component Value Date   HGBA1C 6.1 10/29/2021      Assessment & Plan:   Immunizations: Patient will receive the RSV immunization today.  Vasomotor Rhinitis: Patient has been prescribed Azelastine nasal spray and will also take otc Cetrizine.   Problem List Items Addressed This Visit       Cardiovascular and Mediastinum   Essential hypertension - Primary     Other   Hyperlipidemia   Hyperglycemia   Vitamin D deficiency   No orders of the defined types were placed in this encounter.  I, Kellie Simmering, personally preformed the services described in this documentation.  All medical record entries made by the scribe were at my direction and in my presence.  I have reviewed the chart and discharge instructions (if applicable) and agree that the record reflects my personal performance and is accurate and complete. 01/28/2022  I,Mohammed Iqbal,acting as a scribe for Penni Homans, MD.,have documented all relevant documentation on the behalf of Penni Homans, MD,as directed by  Penni Homans, MD while in the presence of Penni Homans, MD.  Kellie Simmering

## 2022-01-28 NOTE — Patient Instructions (Signed)
Allergic Rhinitis, Adult  Allergic rhinitis is an allergic reaction that affects the mucous membrane inside the nose. The mucous membrane is the tissue that produces mucus. There are two types of allergic rhinitis: Seasonal. This type is also called hay fever and happens only during certain seasons. Perennial. This type can happen at any time of the year. Allergic rhinitis cannot be spread from person to person. This condition can be mild, moderate, or severe. It can develop at any age and may be outgrown. What are the causes? This condition is caused by allergens. These are things that can cause an allergic reaction. Allergens may differ for seasonal allergic rhinitis and perennial allergic rhinitis. Seasonal allergic rhinitis is triggered by pollen. Pollen can come from grasses, trees, and weeds. Perennial allergic rhinitis may be triggered by: Dust mites. Proteins in a pet's urine, saliva, or dander. Dander is dead skin cells from a pet. Smoke, mold, or car fumes. What increases the risk? You are more likely to develop this condition if you have a family history of allergies or other conditions related to allergies, including: Allergic conjunctivitis. This is inflammation of parts of the eyes and eyelids. Asthma. This condition affects the lungs and makes it hard to breathe. Atopic dermatitis or eczema. This is long term (chronic) inflammation of the skin. Food allergies. What are the signs or symptoms? Symptoms of this condition include: Sneezing or coughing. A stuffy nose (nasal congestion), itchy nose, or nasal discharge. Itchy eyes and tearing of the eyes. A feeling of mucus dripping down the back of your throat (postnasal drip). Trouble sleeping. Tiredness or fatigue. Headache. Sore throat. How is this diagnosed? This condition may be diagnosed with your symptoms, medical history, and physical exam. Your health care provider may check for related conditions, such  as: Asthma. Pink eye. This is eye inflammation caused by infection (conjunctivitis). Ear infection. Upper respiratory infection. This is an infection in the nose, throat, or upper airways. You may also have tests to find out which allergens trigger your symptoms. These may include skin tests or blood tests. How is this treated? There is no cure for this condition, but treatment can help control symptoms. Treatment may include: Taking medicines that block allergy symptoms, such as corticosteroids and antihistamines. Medicine may be given as a shot, nasal spray, or pill. Avoiding any allergens. Being exposed again and again to tiny amounts of allergens to help you build a defense against allergens (immunotherapy). This is done if other treatments have not helped. It may include: Allergy shots. These are injected medicines that have small amounts of allergen in them. Sublingual immunotherapy. This involves taking small doses of a medicine with allergen in it under your tongue. If these treatments do not work, your health care provider may prescribe newer, stronger medicines. Follow these instructions at home: Avoiding allergens Find out what you are allergic to and avoid those allergens. These are some things you can do to help avoid allergens: If you have perennial allergies: Replace carpet with wood, tile, or vinyl flooring. Carpet can trap dander and dust. Do not smoke. Do not allow smoking in your home. Change your heating and air conditioning filters at least once a month. If you have seasonal allergies, take these steps during allergy season: Keep windows closed as much as possible. Plan outdoor activities when pollen counts are lowest. Check pollen counts before you plan outdoor activities. When coming indoors, change clothing and shower before sitting on furniture or bedding. If you have a pet   in the house that produces allergens: Keep the pet out of the bedroom. Vacuum, sweep, and  dust regularly. General instructions Take over-the-counter and prescription medicines only as told by your health care provider. Drink enough fluid to keep your urine pale yellow. Keep all follow-up visits as told by your health care provider. This is important. Where to find more information American Academy of Allergy, Asthma & Immunology: www.aaaai.org Contact a health care provider if: You have a fever. You develop a cough that does not go away. You make whistling sounds when you breathe (wheeze). Your symptoms slow you down or stop you from doing your normal activities each day. Get help right away if: You have shortness of breath. This symptom may represent a serious problem that is an emergency. Do not wait to see if the symptom will go away. Get medical help right away. Call your local emergency services (911 in the [place] Do not drive yourself to the hospital. Summary Allergic rhinitis may be managed by taking medicines as directed and avoiding allergens. If you have seasonal allergies, keep windows closed as much as possible during allergy season. Contact your health care provider if you develop a fever or a cough that does not go away. This information is not intended to replace advice given to you by your health care provider. Make sure you discuss any questions you have with your health care provider. Document Revised: 08/01/2021 Document Reviewed: 02/15/2019 Elsevier Patient Education  2023 Elsevier Inc.  

## 2022-01-29 DIAGNOSIS — M4155 Other secondary scoliosis, thoracolumbar region: Secondary | ICD-10-CM | POA: Diagnosis not present

## 2022-01-29 DIAGNOSIS — G309 Alzheimer's disease, unspecified: Secondary | ICD-10-CM | POA: Diagnosis not present

## 2022-01-29 DIAGNOSIS — M2170 Unequal limb length (acquired), unspecified site: Secondary | ICD-10-CM | POA: Diagnosis not present

## 2022-01-29 DIAGNOSIS — R296 Repeated falls: Secondary | ICD-10-CM | POA: Diagnosis not present

## 2022-01-29 DIAGNOSIS — I10 Essential (primary) hypertension: Secondary | ICD-10-CM | POA: Diagnosis not present

## 2022-01-30 ENCOUNTER — Telehealth: Payer: Self-pay | Admitting: Family Medicine

## 2022-01-30 DIAGNOSIS — R296 Repeated falls: Secondary | ICD-10-CM | POA: Insufficient documentation

## 2022-01-30 HISTORY — DX: Repeated falls: R29.6

## 2022-01-30 NOTE — Telephone Encounter (Signed)
Called back lvm for verbal order for [facility]: Monique, Physical Therapy with [place] Number: [phone]

## 2022-01-30 NOTE — Assessment & Plan Note (Signed)
She had some diarrhea initially on Aricept but it is improving now. She will follow up with neurology.

## 2022-01-30 NOTE — Telephone Encounter (Signed)
Caller/Agency: Beckie Busing, Physical Therapy with [facility] Number: [phone] Requesting OT/PT/Skilled Nursing/Social Work/Speech Therapy: Begin home Physical Therapy  Frequency: 1x1,2x3,1x3

## 2022-01-30 NOTE — Assessment & Plan Note (Signed)
Try Astelin bid and report if symptoms worsen.

## 2022-01-30 NOTE — Assessment & Plan Note (Signed)
Agrees to home PT for strengthening and gait training, she has recently begun using a quad cane and OT safety evaluation.

## 2022-02-04 DIAGNOSIS — F02818 Dementia in other diseases classified elsewhere, unspecified severity, with other behavioral disturbance: Secondary | ICD-10-CM | POA: Diagnosis not present

## 2022-02-04 DIAGNOSIS — G309 Alzheimer's disease, unspecified: Secondary | ICD-10-CM | POA: Diagnosis not present

## 2022-02-04 DIAGNOSIS — G4700 Insomnia, unspecified: Secondary | ICD-10-CM | POA: Diagnosis not present

## 2022-02-04 DIAGNOSIS — R42 Dizziness and giddiness: Secondary | ICD-10-CM | POA: Diagnosis not present

## 2022-02-04 DIAGNOSIS — R296 Repeated falls: Secondary | ICD-10-CM | POA: Diagnosis not present

## 2022-02-04 DIAGNOSIS — I10 Essential (primary) hypertension: Secondary | ICD-10-CM | POA: Diagnosis not present

## 2022-02-06 DIAGNOSIS — G4700 Insomnia, unspecified: Secondary | ICD-10-CM | POA: Diagnosis not present

## 2022-02-06 DIAGNOSIS — R42 Dizziness and giddiness: Secondary | ICD-10-CM | POA: Diagnosis not present

## 2022-02-06 DIAGNOSIS — G309 Alzheimer's disease, unspecified: Secondary | ICD-10-CM | POA: Diagnosis not present

## 2022-02-06 DIAGNOSIS — F02818 Dementia in other diseases classified elsewhere, unspecified severity, with other behavioral disturbance: Secondary | ICD-10-CM | POA: Diagnosis not present

## 2022-02-06 DIAGNOSIS — I10 Essential (primary) hypertension: Secondary | ICD-10-CM | POA: Diagnosis not present

## 2022-02-06 DIAGNOSIS — R296 Repeated falls: Secondary | ICD-10-CM | POA: Diagnosis not present

## 2022-02-10 DIAGNOSIS — R296 Repeated falls: Secondary | ICD-10-CM | POA: Diagnosis not present

## 2022-02-10 DIAGNOSIS — G309 Alzheimer's disease, unspecified: Secondary | ICD-10-CM | POA: Diagnosis not present

## 2022-02-10 DIAGNOSIS — G4700 Insomnia, unspecified: Secondary | ICD-10-CM | POA: Diagnosis not present

## 2022-02-10 DIAGNOSIS — R42 Dizziness and giddiness: Secondary | ICD-10-CM | POA: Diagnosis not present

## 2022-02-10 DIAGNOSIS — F02818 Dementia in other diseases classified elsewhere, unspecified severity, with other behavioral disturbance: Secondary | ICD-10-CM | POA: Diagnosis not present

## 2022-02-10 DIAGNOSIS — I10 Essential (primary) hypertension: Secondary | ICD-10-CM | POA: Diagnosis not present

## 2022-02-12 DIAGNOSIS — F02818 Dementia in other diseases classified elsewhere, unspecified severity, with other behavioral disturbance: Secondary | ICD-10-CM | POA: Diagnosis not present

## 2022-02-12 DIAGNOSIS — R296 Repeated falls: Secondary | ICD-10-CM | POA: Diagnosis not present

## 2022-02-12 DIAGNOSIS — I10 Essential (primary) hypertension: Secondary | ICD-10-CM | POA: Diagnosis not present

## 2022-02-12 DIAGNOSIS — G4700 Insomnia, unspecified: Secondary | ICD-10-CM | POA: Diagnosis not present

## 2022-02-12 DIAGNOSIS — G309 Alzheimer's disease, unspecified: Secondary | ICD-10-CM | POA: Diagnosis not present

## 2022-02-12 DIAGNOSIS — R42 Dizziness and giddiness: Secondary | ICD-10-CM | POA: Diagnosis not present

## 2022-02-18 DIAGNOSIS — I10 Essential (primary) hypertension: Secondary | ICD-10-CM | POA: Diagnosis not present

## 2022-02-18 DIAGNOSIS — G309 Alzheimer's disease, unspecified: Secondary | ICD-10-CM | POA: Diagnosis not present

## 2022-02-18 DIAGNOSIS — G4700 Insomnia, unspecified: Secondary | ICD-10-CM | POA: Diagnosis not present

## 2022-02-18 DIAGNOSIS — R296 Repeated falls: Secondary | ICD-10-CM | POA: Diagnosis not present

## 2022-02-18 DIAGNOSIS — R42 Dizziness and giddiness: Secondary | ICD-10-CM | POA: Diagnosis not present

## 2022-02-18 DIAGNOSIS — F02818 Dementia in other diseases classified elsewhere, unspecified severity, with other behavioral disturbance: Secondary | ICD-10-CM | POA: Diagnosis not present

## 2022-02-20 DIAGNOSIS — I10 Essential (primary) hypertension: Secondary | ICD-10-CM | POA: Diagnosis not present

## 2022-02-20 DIAGNOSIS — R296 Repeated falls: Secondary | ICD-10-CM | POA: Diagnosis not present

## 2022-02-20 DIAGNOSIS — G4700 Insomnia, unspecified: Secondary | ICD-10-CM | POA: Diagnosis not present

## 2022-02-20 DIAGNOSIS — F02818 Dementia in other diseases classified elsewhere, unspecified severity, with other behavioral disturbance: Secondary | ICD-10-CM | POA: Diagnosis not present

## 2022-02-20 DIAGNOSIS — G309 Alzheimer's disease, unspecified: Secondary | ICD-10-CM | POA: Diagnosis not present

## 2022-02-20 DIAGNOSIS — R42 Dizziness and giddiness: Secondary | ICD-10-CM | POA: Diagnosis not present

## 2022-02-26 DIAGNOSIS — G309 Alzheimer's disease, unspecified: Secondary | ICD-10-CM | POA: Diagnosis not present

## 2022-02-26 DIAGNOSIS — I10 Essential (primary) hypertension: Secondary | ICD-10-CM | POA: Diagnosis not present

## 2022-02-26 DIAGNOSIS — G4700 Insomnia, unspecified: Secondary | ICD-10-CM | POA: Diagnosis not present

## 2022-02-26 DIAGNOSIS — R296 Repeated falls: Secondary | ICD-10-CM | POA: Diagnosis not present

## 2022-02-26 DIAGNOSIS — R42 Dizziness and giddiness: Secondary | ICD-10-CM | POA: Diagnosis not present

## 2022-02-26 DIAGNOSIS — F02818 Dementia in other diseases classified elsewhere, unspecified severity, with other behavioral disturbance: Secondary | ICD-10-CM | POA: Diagnosis not present

## 2022-03-06 ENCOUNTER — Ambulatory Visit (HOSPITAL_BASED_OUTPATIENT_CLINIC_OR_DEPARTMENT_OTHER)
Admission: RE | Admit: 2022-03-06 | Discharge: 2022-03-06 | Disposition: A | Discharge status: Home / Self Care | Payer: Medicare Other | Source: Ambulatory Visit | Attending: Family Medicine | Admitting: Family Medicine

## 2022-03-06 DIAGNOSIS — M85851 Other specified disorders of bone density and structure, right thigh: Secondary | ICD-10-CM | POA: Diagnosis not present

## 2022-03-06 DIAGNOSIS — M85831 Other specified disorders of bone density and structure, right forearm: Secondary | ICD-10-CM | POA: Diagnosis not present

## 2022-03-06 DIAGNOSIS — Z780 Asymptomatic menopausal state: Secondary | ICD-10-CM | POA: Insufficient documentation

## 2022-03-06 DIAGNOSIS — M8580 Other specified disorders of bone density and structure, unspecified site: Secondary | ICD-10-CM | POA: Diagnosis not present

## 2022-03-07 DIAGNOSIS — F02818 Dementia in other diseases classified elsewhere, unspecified severity, with other behavioral disturbance: Secondary | ICD-10-CM | POA: Diagnosis not present

## 2022-03-07 DIAGNOSIS — G4700 Insomnia, unspecified: Secondary | ICD-10-CM | POA: Diagnosis not present

## 2022-03-07 DIAGNOSIS — R42 Dizziness and giddiness: Secondary | ICD-10-CM | POA: Diagnosis not present

## 2022-03-07 DIAGNOSIS — R296 Repeated falls: Secondary | ICD-10-CM | POA: Diagnosis not present

## 2022-03-07 DIAGNOSIS — I10 Essential (primary) hypertension: Secondary | ICD-10-CM | POA: Diagnosis not present

## 2022-03-07 DIAGNOSIS — G309 Alzheimer's disease, unspecified: Secondary | ICD-10-CM | POA: Diagnosis not present

## 2022-03-13 DIAGNOSIS — G309 Alzheimer's disease, unspecified: Secondary | ICD-10-CM | POA: Diagnosis not present

## 2022-03-13 DIAGNOSIS — R42 Dizziness and giddiness: Secondary | ICD-10-CM | POA: Diagnosis not present

## 2022-03-13 DIAGNOSIS — R296 Repeated falls: Secondary | ICD-10-CM | POA: Diagnosis not present

## 2022-03-13 DIAGNOSIS — I10 Essential (primary) hypertension: Secondary | ICD-10-CM | POA: Diagnosis not present

## 2022-03-13 DIAGNOSIS — F02818 Dementia in other diseases classified elsewhere, unspecified severity, with other behavioral disturbance: Secondary | ICD-10-CM | POA: Diagnosis not present

## 2022-03-13 DIAGNOSIS — G4700 Insomnia, unspecified: Secondary | ICD-10-CM | POA: Diagnosis not present

## 2022-03-24 ENCOUNTER — Other Ambulatory Visit: Payer: Self-pay | Admitting: Physician Assistant

## 2022-04-07 ENCOUNTER — Ambulatory Visit (INDEPENDENT_AMBULATORY_CARE_PROVIDER_SITE_OTHER): Payer: Medicare Other | Admitting: Family Medicine

## 2022-04-07 ENCOUNTER — Encounter: Payer: Self-pay | Admitting: Family Medicine

## 2022-04-07 ENCOUNTER — Telehealth: Payer: Self-pay | Admitting: Family Medicine

## 2022-04-07 VITALS — BP 121/70 | HR 93 | Temp 97.5°F | Ht 62.0 in | Wt 120.1 lb

## 2022-04-07 DIAGNOSIS — K921 Melena: Secondary | ICD-10-CM

## 2022-04-07 DIAGNOSIS — R159 Full incontinence of feces: Secondary | ICD-10-CM | POA: Diagnosis not present

## 2022-04-07 MED ORDER — HYDROCORTISONE 2.5 % EX CREA: TOPICAL_CREAM | Freq: Two times a day (BID) | CUTANEOUS | 0 refills | Status: DC

## 2022-04-07 NOTE — Telephone Encounter (Signed)
Son, Christine Henderson, called to advise that his mom told him she has been having issues with bowel movements for about a week. She tried to describe it to him but it didn't make a lot of sense to him. She seems to have blood in stool, it's not diarrhea but she's going a lot. Patient's son willing to speak to Triage to see if he can answer questions regarding blood in stool. He is willing to go to mom's house and callback if necessary to discuss. Transferred to triage

## 2022-04-07 NOTE — Telephone Encounter (Signed)
Initial Comment Caller states his mother has been having bowel issues for a week. She states her rectum is moist and doesn't feel right. She is having urgency to defecate and when she goes it is tiny pellets. Caller was transferred for triage from the office. Translation No Nurse Assessment Nurse: Micki Riley, RN, Domenick Gong Date/Time (Eastern Time): 04/07/2022 8:24:58 AM Confirm and document reason for call. If symptomatic, describe symptoms. ---Caller states mother has been having the urge to have a bm, but only has been making 'pellets'; ongoing for the past week. States she has not been able to control her bms, 'rectum is wet'. Denies fever or any other symptoms. Does the patient have any new or worsening symptoms? ---Yes Will a triage be completed? ---Yes Related visit to physician within the last 2 weeks? ---N/A Does the PT have any chronic conditions? (i.e. diabetes, asthma, this includes High risk factors for pregnancy, etc.) ---Unknown Is this a behavioral health or substance abuse call? ---No Guidelines Guideline Title Affirmed Question Affirmed Notes Nurse Date/Time (Eastern Time) Constipation Leaking stool Cazares, RN, Domenick Gong 04/07/2022 8:27:00 AM Disp. Time Eilene Ghazi Time) Disposition Final User 04/07/2022 8:31:58 AM See PCP within 24 Hours Yes Cazares, RN, Vivien Rota Janie PLEASE NOTE: All timestamps contained within this report are represented as [place] Standard Time. CONFIDENTIALTY NOTICE: This fax transmission is intended only for the addressee. It contains information that is legally privileged, confidential or otherwise protected from use or disclosure. If you are not the intended recipient, you are strictly prohibited from reviewing, disclosing, copying using or disseminating any of this information or taking any action in reliance on or regarding this information. If you have received this fax in error, please notify us immediately by telephone so that  we can arrange for its return to [place]. Phone: [phone], Toll-Free: [phone], Fax: [phone] Page: 2 of 2 Call Id: [id number] Final Disposition 04/07/2022 8:31:58 AM See PCP within 24 Hours Yes Cazares, RN, [place] Disagree/Comply Comply Caller Understands Yes PreDisposition Did not know what to do Care Advice Given Per Guideline SEE PCP WITHIN 24 HOURS: CARE ADVICE given per Constipation (Adult) guideline. * You become worse * Vomiting occurs * Constant abdomen pain lasting over 2 hours * Severe or increasing abdomen pain CALL BACK IF: Referrals REFERRED TO PCP OFFICE

## 2022-04-07 NOTE — Telephone Encounter (Signed)
Called pt son regarding mother concerns  Pt has appt today to be seen.

## 2022-04-07 NOTE — Patient Instructions (Addendum)
Give [place] 2-3 business days to get the results of your labs back.   Take Metamucil or Benefiber daily.  Stay hydrated. Try to drink 55-60 oz of water daily outside of exercise.  If you do not hear anything about your referral in the next 1-2 weeks, call our office and ask for an update.  Let us know if you need anything.

## 2022-04-07 NOTE — Progress Notes (Signed)
Chief Complaint  Patient presents with   finding stool in her underpants    Subjective: Patient is a 89 y.o. female here for bowel issues. Here w son who helps provide hx.   1 week ago, she is finding small pellets in her underwear, falling out of her bottom. Getting worse. It is not watery. She does feel a large piece of skin. Her bottom is a little sore. No abd pain, bleeding, fevers, N/V. No issues with eating. Has not tried anything at home.   Past Medical History:  Diagnosis Date   Abdominal pain, unspecified site 06/26/2008   ALLERGIC RHINITIS CAUSE UNSPECIFIED 02/02/2008   Annual physical exam 12/26/2012   Diarrhea 06/26/2008   Fibrocystic breast disease    Headache(784.0) 10/01/2006   Hereditary and idiopathic peripheral neuropathy 01/10/2016   HYPERLIPIDEMIA 10/01/2006   HYPERTENSION 10/06/2007   IBS (irritable bowel syndrome)    Insomnia 11/14/2012   Irritable bowel syndrome 09/10/2006   Medicare annual wellness visit, subsequent 12/26/2012   Sees Dr Elinor Parkinson of opthamology.  MGM 5/14      Metatarsal stress fracture 01/20/2016   MIGRAINE HEADACHE 09/10/2006   MVP (mitral valve prolapse)    Onychomycosis 07/09/2014   OSTEOPENIA 09/10/2006   Osteopenia 11/12/2012   Palpitations 09/10/2006   PHOTOPHOBIA 07/20/2009   SKIN TAG 11/09/2006   VAGINITIS, ATROPHIC 10/01/2006   Vitamin D deficiency 07/14/2016    Objective: BP 121/70 (BP Location: Left Arm, Patient Position: Sitting, Cuff Size: Normal)   Pulse 93   Temp (!) 97.5 F (36.4 C) (Oral)   Ht '5\' 2"'$  (1.575 m)   Wt 120 lb 2 oz (54.5 kg)   SpO2 97%   BMI 21.97 kg/m  General: Awake, appears stated age Heart: RRR, no LE edema Lungs: CTAB, no rales, wheezes or rhonchi. No accessory muscle use Rectal: Examined in presence of female chaperone; some skin tags noted, no fissures or erythema, evidence of irritation, no stool noted, no prolapse Psych: Age appropriate judgment and insight, normal affect and mood  Assessment and  Plan: Incontinence of feces, unspecified fecal incontinence type - Plan: Ambulatory referral to Gastroenterology  Black stool - Plan: CBC  Did not appreciate a prolapse on exam. Will rec hydration, fiber supp and ck CBC given tarry stool. Will refer to GI for their opinion. Cortisone cream for comfort  The patient and her son voiced understanding and agreement to the plan.  [place], DO 04/07/22  2:47 PM

## 2022-04-08 ENCOUNTER — Encounter: Payer: Self-pay | Admitting: Nurse Practitioner

## 2022-04-08 LAB — CBC
HCT: 37.6 % (ref 36.0–46.0)
Hemoglobin: 12.5 g/dL (ref 12.0–15.0)
MCHC: 33.2 g/dL (ref 30.0–36.0)
MCV: 95.5 fl (ref 78.0–100.0)
Platelets: 324 10*3/uL (ref 150.0–400.0)
RBC: 3.94 Mil/uL (ref 3.87–5.11)
RDW: 15.2 % (ref 11.5–15.5)
WBC: 10.3 10*3/uL (ref 4.0–10.5)

## 2022-04-17 ENCOUNTER — Encounter: Payer: Self-pay | Admitting: Internal Medicine

## 2022-04-17 ENCOUNTER — Ambulatory Visit: Payer: Medicare Other | Admitting: Internal Medicine

## 2022-04-17 VITALS — BP 102/74 | HR 116 | Ht 62.0 in | Wt 120.0 lb

## 2022-04-17 DIAGNOSIS — R194 Change in bowel habit: Secondary | ICD-10-CM

## 2022-04-17 DIAGNOSIS — K5900 Constipation, unspecified: Secondary | ICD-10-CM

## 2022-04-17 DIAGNOSIS — R197 Diarrhea, unspecified: Secondary | ICD-10-CM | POA: Diagnosis not present

## 2022-04-17 DIAGNOSIS — K648 Other hemorrhoids: Secondary | ICD-10-CM | POA: Diagnosis not present

## 2022-04-17 NOTE — Patient Instructions (Addendum)
_______________________________________________________  If your blood pressure at your visit was 140/90 or greater, please contact your primary care physician to follow up on this.  If you are age 87 or older, your body mass index should be between 23-30. Your Body mass index is 21.95 kg/m. If this is out of the aforementioned range listed, please consider follow up with your Primary Care Provider.   The [facility] providers would like to encourage you to use [facility] to communicate with providers for non-urgent requests or questions.  Due to long hold times on the telephone, sending your provider a message by [facility] may be a faster and more efficient way to get a response.  Please allow 48 business hours for a response.  Please remember that this is for non-urgent requests.  _______________________________________________________  Dr Hilarie Fredrickson recommends that you complete a bowel purge (to clean out your bowels). Please do the following:  Purchase a bottle of Miralax over the counter as well as a box of 5 mg dulcolax tablets.  Take 2 dulcolax tablets. Wait 1 hour. You will then drink 3-4 capfuls of Miralax mixed in an adequate amount of water/juice/gatorade (you may choose which of these liquids to drink) over the next 2-3 hours.  You should expect results within 1 to 6 hours after completing the bowel purge.  If you have not achieved any results after this time frame please repeat purge.  Take 2 dulcolax tablets. Wait 1 hour. You will then drink 3-4 capfuls of Miralax mixed in an adequate amount of water/juice/gatorade (you may choose which of these liquids to drink) over the next 2-3 hours.  The day after you complete the bowel purge please restart using Metamucil every day.  You will then start using Miralax 17 grams daily.  You are scheduled to follow up on 05-15-22 at 9:00am with Amy Esterwood, PA-C.  Thank you for entrusting me with your care and choosing [facility].  Dr  Hilarie Fredrickson

## 2022-04-17 NOTE — Progress Notes (Signed)
Patient ID: Christine Henderson, female   DOB: 1930-08-03, 89 y.o.   MRN: [id number] HPI: Christine Henderson is a 89 year old female with a history of mild cognitive impairment, hypertension, hyperlipidemia, history of migraine headaches and vitamin D deficiency who is seen to evaluate change in bowel habit, encopresis and intermittent rectal bleeding.  She is here today with her son.  She and her son report that about 3 to 4 weeks ago she developed a change in her bowel pattern.  Stools became small and hard "little balls".  Also intermittent spontaneous passage of hard stool without knowledge.  Occasionally some liquid stool will leak out of her rectum as well.  She has noticed "swelling" with wiping and occasional blood with bowel movement.  She has tried Metamucil daily for the last 10 days and see no real change in symptoms.  She also tried twice daily hydrocortisone cream without benefit.  She does note some urge for defecation but at times has been unable to do so.  No new medications in the last 1 to 2 months.  Her last colonoscopy was in 2005 with Dr. Olevia Perches which was normal at that time.  Past Medical History:  Diagnosis Date   Abdominal pain, unspecified site 06/26/2008   ALLERGIC RHINITIS CAUSE UNSPECIFIED 02/02/2008   Annual physical exam 12/26/2012   Diarrhea 06/26/2008   Fibrocystic breast disease    Headache(784.0) 10/01/2006   Hereditary and idiopathic peripheral neuropathy 01/10/2016   HYPERLIPIDEMIA 10/01/2006   HYPERTENSION 10/06/2007   IBS (irritable bowel syndrome)    Insomnia 11/14/2012   Irritable bowel syndrome 09/10/2006   Medicare annual wellness visit, subsequent 12/26/2012   Sees Dr Elinor Parkinson of opthamology.  MGM 5/14      Metatarsal stress fracture 01/20/2016   MIGRAINE HEADACHE 09/10/2006   MVP (mitral valve prolapse)    Onychomycosis 07/09/2014   OSTEOPENIA 09/10/2006   Osteopenia 11/12/2012   Palpitations 09/10/2006   PHOTOPHOBIA 07/20/2009   SKIN TAG 11/09/2006    VAGINITIS, ATROPHIC 10/01/2006   Vitamin D deficiency 07/14/2016    Past Surgical History:  Procedure Laterality Date   DENTAL SURGERY  09/2014   ROTATOR CUFF REPAIR Right    TONSILLECTOMY      Outpatient Medications Prior to Visit  Medication Sig Dispense Refill   Ascorbic Acid (VITAMIN C) 1000 MG tablet Take 1,000 mg by mouth daily.     azelastine (ASTELIN) 0.1 % nasal spray Place 2 sprays into both nostrils 2 (two) times daily. Use in each nostril as directed 30 mL 3   Calcium Carbonate-Vitamin D (CALCIUM PLUS VITAMIN D PO) Take 1,200 tablets by mouth daily.     donepezil (ARICEPT) 10 MG tablet TAKE 1 TABLET BY MOUTH DAILY 100 tablet 0   fish oil-omega-3 fatty acids 1000 MG capsule Take 2 g by mouth daily.     Glucosamine-Chondroit-Vit C-Mn (GLUCOSAMINE 1500 COMPLEX) CAPS Take 1,500 capsules by mouth 2 (two) times daily.     hydrocortisone 2.5 % cream Apply topically 2 (two) times daily. 30 g 0   losartan (COZAAR) 25 MG tablet TAKE 1/2 TO 1 TABLET BY  MOUTH DAILY 90 tablet 3   Multiple Vitamin (MULTIVITAMIN) tablet Take 1 tablet by mouth daily.     simvastatin (ZOCOR) 10 MG tablet TAKE 1 TABLET BY MOUTH AT  BEDTIME 90 tablet 3   No facility-administered medications prior to visit.    Allergies  Allergen Reactions   Codeine     REACTION: Nausea  Vomiting  Family History  Problem Relation Age of Onset   Alzheimer's disease Mother    Inflammatory bowel disease Sister    Alzheimer's disease Sister    Pancreatic cancer Maternal Grandfather        unknown details   Hyperlipidemia Son    Obesity Son    Hyperlipidemia Son    Heart disease Paternal Uncle    Coronary artery disease Neg Hx    Colon cancer Neg Hx    Stomach cancer Neg Hx     Social History   Tobacco Use   Smoking status: Never   Smokeless tobacco: Never  Vaping Use   Vaping Use: Never used  Substance Use Topics   Alcohol use: No   Drug use: No    ROS: As per history of present illness, otherwise  negative  BP 102/74   Pulse (!) 116   Ht 5' 2"$  (1.575 m)   Wt 120 lb (54.4 kg)   SpO2 97%   BMI 21.95 kg/m  Gen: awake, alert, NAD HEENT: anicteric  CV: RRR, no mrg Pulm: CTA b/l Abd: soft, NT/ND, +BS throughout Rectal: Formed hard stool in patient's depends, prolapsed internal hemorrhoids noted, no perianal rash or external hemorrhoid; nontender exam; DRE normal without mass and vault empty of stool  Neuro: nonfocal ANOSCOPY: Using a disposable, lubricated, slotted, self-illuminating anoscope, the rectum was intubated without difficulty. The trochar was removed and the ano-rectum was circumferentially inspected. There were internal hemorrhoids, RA=LL=RP. There was no finding of an anorectal fissure.  In the posterior rectum there was an area of excoriation most consistent with a stercoral type erosion/ulcer.  No evidence of any clearly neoplastic process. The inspection was well tolerated.    RELEVANT LABS AND IMAGING: CBC    Component Value Date/Time   WBC 10.3 04/07/2022 1455   RBC 3.94 04/07/2022 1455   HGB 12.5 04/07/2022 1455   HCT 37.6 04/07/2022 1455   PLT 324.0 04/07/2022 1455   MCV 95.5 04/07/2022 1455   MCH 32.7 10/23/2020 0220   MCHC 33.2 04/07/2022 1455   RDW 15.2 04/07/2022 1455   LYMPHSABS 1.5 11/07/2020 1411   MONOABS 0.5 11/07/2020 1411   EOSABS 0.1 11/07/2020 1411   BASOSABS 0.1 11/07/2020 1411    CMP     Component Value Date/Time   NA 141 10/29/2021 0751   K 3.9 10/29/2021 0751   CL 103 10/29/2021 0751   CO2 28 10/29/2021 0751   GLUCOSE 108 (H) 10/29/2021 0751   BUN 29 (H) 10/29/2021 0751   CREATININE 1.04 10/29/2021 0751   CREATININE 0.81 06/27/2013 1148   CALCIUM 9.5 10/29/2021 0751   PROT 7.2 10/29/2021 0751   ALBUMIN 4.2 10/29/2021 0751   AST 26 10/29/2021 0751   ALT 18 10/29/2021 0751   ALKPHOS 44 10/29/2021 0751   BILITOT 1.1 10/29/2021 0751   GFRNONAA >60 10/23/2020 0220   GFRAA 41 (L) 06/07/2018 1510     ASSESSMENT/PLAN: 89 year old female with a history of mild cognitive impairment, hypertension, hyperlipidemia, history of migraine headaches and vitamin D deficiency who is seen to evaluate change in bowel habit, encopresis and intermittent rectal bleeding.    Constipation/stercoral ulcer/encopresis/internal hemorrhoids--she likely recently developed fecal impaction though this seems to have cleared.  She remains constipated and is having hemorrhoidal bleeding and encopresis.  We discussed treatment as follows and short follow-up: -- MiraLAX prep for purge (1/2 to full prep depending on response) -- After purge begin MiraLAX 17 g daily and continue Metamucil 1 to 2  teaspoons daily -- Follow-up with me or APP in 4 to 6 weeks; if persistent symptoms consider cross-sectional imaging and direct visualization with either flexible sigmoidoscopy versus colonoscopy      [id number], [address],  [place] [id number]

## 2022-04-29 DIAGNOSIS — H52203 Unspecified astigmatism, bilateral: Secondary | ICD-10-CM | POA: Diagnosis not present

## 2022-04-29 DIAGNOSIS — D3131 Benign neoplasm of right choroid: Secondary | ICD-10-CM | POA: Diagnosis not present

## 2022-04-29 DIAGNOSIS — Z961 Presence of intraocular lens: Secondary | ICD-10-CM | POA: Diagnosis not present

## 2022-05-01 ENCOUNTER — Encounter: Payer: Self-pay | Admitting: Physician Assistant

## 2022-05-06 ENCOUNTER — Ambulatory Visit: Payer: Medicare Other | Admitting: Nurse Practitioner

## 2022-05-15 ENCOUNTER — Encounter (HOSPITAL_BASED_OUTPATIENT_CLINIC_OR_DEPARTMENT_OTHER): Payer: Self-pay | Admitting: Pediatrics

## 2022-05-15 ENCOUNTER — Other Ambulatory Visit (INDEPENDENT_AMBULATORY_CARE_PROVIDER_SITE_OTHER): Payer: Medicare Other

## 2022-05-15 ENCOUNTER — Encounter: Payer: Self-pay | Admitting: Physician Assistant

## 2022-05-15 ENCOUNTER — Emergency Department (HOSPITAL_BASED_OUTPATIENT_CLINIC_OR_DEPARTMENT_OTHER)
Admission: EM | Admit: 2022-05-15 | Discharge: 2022-05-15 | Disposition: A | Discharge status: Home / Self Care | Payer: Medicare Other | Attending: Emergency Medicine | Admitting: Emergency Medicine

## 2022-05-15 ENCOUNTER — Ambulatory Visit: Payer: Medicare Other | Admitting: Physician Assistant

## 2022-05-15 ENCOUNTER — Other Ambulatory Visit: Payer: Self-pay

## 2022-05-15 VITALS — BP 100/60 | HR 70 | Ht 62.0 in | Wt 122.2 lb

## 2022-05-15 DIAGNOSIS — G309 Alzheimer's disease, unspecified: Secondary | ICD-10-CM | POA: Diagnosis not present

## 2022-05-15 DIAGNOSIS — R159 Full incontinence of feces: Secondary | ICD-10-CM

## 2022-05-15 DIAGNOSIS — I951 Orthostatic hypotension: Secondary | ICD-10-CM | POA: Insufficient documentation

## 2022-05-15 DIAGNOSIS — R0902 Hypoxemia: Secondary | ICD-10-CM | POA: Diagnosis not present

## 2022-05-15 DIAGNOSIS — Z79899 Other long term (current) drug therapy: Secondary | ICD-10-CM | POA: Insufficient documentation

## 2022-05-15 DIAGNOSIS — E860 Dehydration: Secondary | ICD-10-CM | POA: Diagnosis not present

## 2022-05-15 DIAGNOSIS — R404 Transient alteration of awareness: Secondary | ICD-10-CM | POA: Diagnosis not present

## 2022-05-15 DIAGNOSIS — R194 Change in bowel habit: Secondary | ICD-10-CM

## 2022-05-15 DIAGNOSIS — I10 Essential (primary) hypertension: Secondary | ICD-10-CM | POA: Diagnosis not present

## 2022-05-15 DIAGNOSIS — R531 Weakness: Secondary | ICD-10-CM | POA: Insufficient documentation

## 2022-05-15 DIAGNOSIS — R55 Syncope and collapse: Secondary | ICD-10-CM | POA: Diagnosis present

## 2022-05-15 DIAGNOSIS — Z743 Need for continuous supervision: Secondary | ICD-10-CM | POA: Diagnosis not present

## 2022-05-15 LAB — CBC
HCT: 36.9 % (ref 36.0–46.0)
Hemoglobin: 12.3 g/dL (ref 12.0–15.0)
MCH: 31.6 pg (ref 26.0–34.0)
MCHC: 33.3 g/dL (ref 30.0–36.0)
MCV: 94.9 fL (ref 80.0–100.0)
Platelets: 293 10*3/uL (ref 150–400)
RBC: 3.89 MIL/uL (ref 3.87–5.11)
RDW: 14.7 % (ref 11.5–15.5)
WBC: 10.1 10*3/uL (ref 4.0–10.5)
nRBC: 0 % (ref 0.0–0.2)

## 2022-05-15 LAB — BASIC METABOLIC PANEL
Anion gap: 7 (ref 5–15)
BUN: 29 mg/dL — ABNORMAL HIGH (ref 6–23)
BUN: 29 mg/dL — ABNORMAL HIGH (ref 8–23)
CO2: 28 mEq/L (ref 19–32)
CO2: 25 mmol/L (ref 22–32)
Calcium: 9.6 mg/dL (ref 8.4–10.5)
Calcium: 8.7 mg/dL — ABNORMAL LOW (ref 8.9–10.3)
Chloride: 101 mEq/L (ref 96–112)
Chloride: 104 mmol/L (ref 98–111)
Creatinine, Ser: 1.05 mg/dL (ref 0.40–1.20)
Creatinine, Ser: 1.05 mg/dL — ABNORMAL HIGH (ref 0.44–1.00)
GFR, Estimated: 50 mL/min — ABNORMAL LOW (ref >60)
GFR: 46.89 mL/min — ABNORMAL LOW (ref >60.00)
Glucose, Bld: 98 mg/dL (ref 70–99)
Glucose, Bld: 114 mg/dL — ABNORMAL HIGH (ref 70–99)
Potassium: 3.9 mEq/L (ref 3.5–5.1)
Potassium: 4.2 mmol/L (ref 3.5–5.1)
Sodium: 137 mEq/L (ref 135–145)
Sodium: 136 mmol/L (ref 135–145)

## 2022-05-15 LAB — URINALYSIS, MICROSCOPIC (REFLEX)

## 2022-05-15 LAB — URINALYSIS, ROUTINE W REFLEX MICROSCOPIC
Bilirubin Urine: NEGATIVE
Glucose, UA: NEGATIVE mg/dL
Ketones, ur: NEGATIVE mg/dL
Nitrite: NEGATIVE
Protein, ur: NEGATIVE mg/dL
Specific Gravity, Urine: 1.015 (ref 1.005–1.030)
pH: 7 (ref 5.0–8.0)

## 2022-05-15 LAB — CBG MONITORING, ED: Glucose-Capillary: 115 mg/dL — ABNORMAL HIGH (ref 70–99)

## 2022-05-15 LAB — MAGNESIUM: Magnesium: 1.8 mg/dL (ref 1.7–2.4)

## 2022-05-15 NOTE — ED Notes (Signed)
Patient denied any weakness, dizziness, shortness of breath

## 2022-05-15 NOTE — Progress Notes (Signed)
Subjective:    Patient ID: Christine Henderson, female    DOB: [birth year], 89 y.o.   MRN: [id number]  HPI Christine Henderson is a pleasant 89 year old white female, known to Dr. Hilarie Fredrickson who comes in today for 1 month follow-up after being seen in the office by Dr. Hilarie Fredrickson on 04/17/2022.  At that time she had presented with complaints of 1 month history of change in bowel pattern with small hard stools, also intermittent spontaneous passage of stool without knowledge and occasional leakage of liquid stool from the rectum.  She also reported noticing some swelling around her rectum and occasional bright red blood with a bowel movement.  On rectal exam she was noted to have formed hard stool in her depends, prolapsed internal hemorrhoids noted no external hemorrhoids nontender to digital exam and vault empty of stool, on anoscopy there were some small internal hemorrhoids, no fissure but in the posterior rectum there was an area of excoriation most consistent with stercoral type erosion/ulcer.  She was given a MiraLAX purge and then asked to take MiraLAX on a regular basis daily in addition to Metamucil 1 to 2 teaspoons daily. Comes back in today stating that she was unable to take the MiraLAX on a regular basis because that caused too much stooling and looser stools.  She is taking the Metamucil most days but admits that she does not like the texture or the taste.  She sometimes avoids this as well if she knows that she has to go somewhere later in the day.  Most days she is incontinent each morning after eating breakfast and is currently having formed stools sometimes still smaller balls of stool but most days stools easy to pass.  She says occasionally she will see a scant amount of blood but not as much as she had been seeing previously.  It does not sound like she is continent and says that she does get an urge to have a bowel movement but is not aware that she is passing any stool and then usually by the time she gets into  the bathroom she has had incontinence into the depends.  Fortunately she is only having 1 bowel movement per day. Other medical problems include mixed dementia, Alzheimer's/vascular, IBS, history of lumbar radiculopathy, hypertension and migraines.  Her last colonoscopy was done in 2005 and was unremarkable.  Review of Systems Pertinent positive and negative review of systems were noted in the above HPI section.  All other review of systems was otherwise negative.   Outpatient Encounter Medications as of 05/15/2022  Medication Sig   Ascorbic Acid (VITAMIN C) 1000 MG tablet Take 1,000 mg by mouth daily.   azelastine (ASTELIN) 0.1 % nasal spray Place 2 sprays into both nostrils 2 (two) times daily. Use in each nostril as directed   Calcium Carbonate-Vitamin D (CALCIUM PLUS VITAMIN D PO) Take 1,200 tablets by mouth daily.   donepezil (ARICEPT) 10 MG tablet TAKE 1 TABLET BY MOUTH DAILY   fish oil-omega-3 fatty acids 1000 MG capsule Take 2 g by mouth daily.   Glucosamine-Chondroit-Vit C-Mn (GLUCOSAMINE 1500 COMPLEX) CAPS Take 1,500 capsules by mouth 2 (two) times daily.   hydrocortisone 2.5 % cream Apply topically 2 (two) times daily.   losartan (COZAAR) 25 MG tablet TAKE 1/2 TO 1 TABLET BY  MOUTH DAILY   Multiple Vitamin (MULTIVITAMIN) tablet Take 1 tablet by mouth daily.   simvastatin (ZOCOR) 10 MG tablet TAKE 1 TABLET BY MOUTH AT  BEDTIME   No facility-administered  encounter medications on file as of 05/15/2022.   Allergies  Allergen Reactions   Codeine     REACTION: Nausea  Vomiting   Patient Active Problem List   Diagnosis Date Noted   Recurrent falls 01/30/2022   Mixed Alzheimer's and vascular dementia ([place]) 12/04/2021   Memory loss 10/25/2021   Disequilibrium 10/25/2021   Heart murmur 10/25/2021   Bilateral impacted cerumen 10/21/2020   Vitamin D deficiency 07/14/2016   Hereditary and idiopathic peripheral neuropathy 01/10/2016   Paresthesia of foot, bilateral 07/09/2014    Hyperglycemia 07/09/2014   Onychomycosis 07/09/2014   Lumbar back pain with radiculopathy affecting left lower extremity 11/13/2013   Back pain 06/27/2013   Preventative health care 12/26/2012   Insomnia 11/14/2012   Osteopenia 11/12/2012   Vasomotor rhinitis 02/02/2008   Essential hypertension 10/06/2007   Hyperlipidemia 10/01/2006   VAGINITIS, ATROPHIC 10/01/2006   Migraine headache 09/10/2006   IRRITABLE BOWEL SYNDROME 09/10/2006   Disorder of bone and cartilage 09/10/2006   Social History   Socioeconomic History   Marital status: Widowed    Spouse name: Not on file   Number of children: 4   Years of education: Not on file   Highest education level: Not on file  Occupational History   Occupation: retired    Fish farm manager: RETIRED  Tobacco Use   Smoking status: Never   Smokeless tobacco: Never  Vaping Use   Vaping Use: Never used  Substance and Sexual Activity   Alcohol use: No   Drug use: No   Sexual activity: Never    Birth control/protection: None    Comment: regular exercise, lives at the [place], widowed in 2014  Other Topics Concern   Not on file  Social History Narrative   Left handed   Caffeine 3-4 glasses of tea   Lives alone and does her own medicine   Social Determinants of Health   Financial Resource Strain: [place]  (10/11/2020)   Overall Financial Resource Strain (CARDIA)    Difficulty of Paying Living Expenses: Not hard at all  Food Insecurity: No Food Insecurity (10/11/2020)   Hunger Vital Sign    Worried About Running Out of Food in the Last Year: Never true    [place] in the Last Year: Never true  Transportation Needs: No Transportation Needs (10/11/2020)   PRAPARE - Hydrologist (Medical): No    Lack of Transportation (Non-Medical): No  Physical Activity: Insufficiently Active (10/11/2020)   Exercise Vital Sign    Days of Exercise per Week: 4 days    Minutes of Exercise per Session: 30 min  Stress: No  Stress Concern Present (10/11/2020)   [place]    Feeling of Stress : Not at all  Social Connections: Moderately Isolated (10/11/2020)   Social Connection and Isolation Panel [NHANES]    Frequency of Communication with Friends and Family: More than three times a week    Frequency of Social Gatherings with Friends and Family: More than three times a week    Attends Religious Services: More than 4 times per year    Active Member of [employer] or Organizations: No    Attends Archivist Meetings: Never    Marital Status: Widowed  Intimate Partner Violence: Not At Risk (10/11/2020)   Humiliation, Afraid, Rape, and Kick questionnaire    Fear of Current or Ex-Partner: No    Emotionally Abused: No    Physically Abused: No  Sexually Abused: No    Ms. Mangold's family history includes Alzheimer's disease in her mother and sister; Heart disease in her paternal uncle; Hyperlipidemia in her son and son; Inflammatory bowel disease in her sister; Obesity in her son; Pancreatic cancer in her maternal grandfather.      Objective:    Vitals:   05/15/22 0848  BP: 100/60  Pulse: 70  SpO2: 97%    Physical Exam Well-developed well-nourished thin very elderly white female in no acute distress.  Pleasant, accompanied by her son  Weight, 122 BMI 22.3  HEENT; nontraumatic normocephalic, EOMI, PE R LA, sclera anicteric. Oropharynx; not examined today Neck; supple, no JVD Cardiovascular; regular rate and rhythm with S1-S2, no murmur rub or gallop Pulmonary; Clear bilaterally Abdomen; soft, nontender, nondistended, no palpable mass or hepatosplenomegaly, bowel sounds are active Rectal;  not done today, see previous note per Dr. Hilarie Fredrickson dated 04/17/2022 Skin; benign exam, no jaundice rash or appreciable lesions Extremities; no clubbing cyanosis or edema skin warm and dry Neuro/Psych; alert and oriented x4, grossly nonfocal mood and  affect appropriate        Assessment & Plan:   #78 89 year old white female now with 64-monthhistory of daily incontinence of stool.  Initially when she was seen she had been having hard syllabic stools and on anoscopy was felt to have had a small stercoral erosion/ulceration also noted to have prolapsed internal hemorrhoids both which could contribute to small-volume hematochezia.  She did a bowel purge, and then tried MiraLAX daily but that caused very loose stools.  She has continued to Metamucil most days and is now having bowel movements on a daily basis but remains incontinent with no knowledge of passage of stool though she gets some vague urge for a bowel movement she has already passed the stool.  Hematochezia much improved though still seeing occasional scant blood.  Currently fecal incontinence appears to be the primary issue, this is likely due to sphincter laxity and/or neurogenic component.  She does also does have Alzheimer's though is mentating well.   2 history of IBS 3.  Lumbar radiculopathy 4.  Mixed dementia 5.  Hypertension 6.  Migraine headaches  Plan; due to advanced age, would like to avoid sedation and colonoscopy.  Will schedule for CT of the abdomen pelvis with contrast to rule out any occult colonic neoplasm. Switch to Benefiber 1 dose daily in a glass of water or juice as she does not like the taste of Metamucil. She was advised to use MiraLAX on a as needed basis if she does not have a bowel movement every 2 days. Further recommendations pending results of CT.  Christine Henderson S Casaundra Takacs PA-C 05/15/2022   Cc: BMosie Lukes MD

## 2022-05-15 NOTE — ED Provider Notes (Signed)
[facility] Provider Note   CSN: [id number] Arrival date & time: 05/15/22  1145     History  Chief Complaint  Patient presents with   Loss of Consciousness    Christine Henderson is a 89 y.o. female with HTN, HLD, migraines, IBS, osteopenia, hyperglycemia, Alzheimer's and vascular mixed dementia, recurrent falls due to disequilibrium who presents with near syncope.   Patient arrives with her husband from [employer]. She states she feels she is dehydrated as she only had ensure this morning and walked at fairly far distance. They walked all the way to the back of [employer] and back to the front, her son was walking very quickly, and she became lightheaded and short of breath and had to sit down. Felt as though she might pass out but did not. Arrived via EMS, who noted increased in HR and decrease in BP. NS 500 ml given en route, and vitals improved. EKG with establish 1st degree heart block per EMS; BGL 150s; mentation at baseline. At this time patient is currently asymptomatic, denies SOB or lightheadedness now. Did not have any CP, palpitations, wheezing, abd pain, N/V/D/C, diaphoresis, lower extremity edema, recent hospitalizations/surgeries, recent travel, doesn't take hormones, has never had a blood clot before. Denies recent illnesses, f/c, melena/hematochezia.    Loss of Consciousness      Home Medications Prior to Admission medications   Medication Sig Start Date End Date Taking? Authorizing Provider  Ascorbic Acid (VITAMIN C) 1000 MG tablet Take 1,000 mg by mouth daily.    [provider]  azelastine (ASTELIN) 0.1 % nasal spray Place 2 sprays into both nostrils 2 (two) times daily. Use in each nostril as directed 01/28/22   Mosie Lukes, MD  Calcium Carbonate-Vitamin D (CALCIUM PLUS VITAMIN D PO) Take 1,200 tablets by mouth daily.    [provider]  donepezil (ARICEPT) 10 MG tablet TAKE 1 TABLET BY MOUTH DAILY 03/24/22    Rondel Jumbo, PA-C  fish oil-omega-3 fatty acids 1000 MG capsule Take 2 g by mouth daily.    [provider]  Glucosamine-Chondroit-Vit C-Mn (GLUCOSAMINE 1500 COMPLEX) CAPS Take 1,500 capsules by mouth 2 (two) times daily.    [provider]  hydrocortisone 2.5 % cream Apply topically 2 (two) times daily. 04/07/22   Shelda Pal, DO  losartan (COZAAR) 25 MG tablet TAKE 1/2 TO 1 TABLET BY  MOUTH DAILY 08/26/21   Mosie Lukes, MD  Multiple Vitamin (MULTIVITAMIN) tablet Take 1 tablet by mouth daily.    [provider]  simvastatin (ZOCOR) 10 MG tablet TAKE 1 TABLET BY MOUTH AT  BEDTIME 01/05/22   Mosie Lukes, MD      Allergies    Codeine    Review of Systems   Review of Systems  Cardiovascular:  Positive for syncope.   Review of systems Negative for CP.  A 10 point review of systems was performed and is negative unless otherwise reported in HPI.  Physical Exam Updated Vital Signs BP (!) 144/108   Pulse 72   Temp 98.1 F (36.7 C) (Oral)   Resp 18   Ht 5\' 2"  (1.575 m)   Wt 55.4 kg   SpO2 98%   BMI 22.35 kg/m  Physical Exam General: Normal appearing elderly female, sitting in bed.  HEENT: PERRLA, Sclera anicteric, MMM, trachea midline.  Cardiology: RRR, no murmurs/rubs/gallops. BL radial and DP pulses equal bilaterally.  Resp: Normal respiratory rate and effort. CTAB,  no wheezes, rhonchi, crackles.  Abd: Soft, non-tender, non-distended. No rebound tenderness or guarding.  GU: Deferred. MSK: No peripheral edema or signs of trauma. Extremities without deformity or TTP. No cyanosis or clubbing. Skin: warm, dry.  Neuro: A&Ox4, CNs II-XII grossly intact. MAEs. Sensation grossly intact.  Psych: Normal mood and affect.   ED Results / Procedures / Treatments   Labs (all labs ordered are listed, but only abnormal results are displayed) Labs Reviewed  BASIC METABOLIC PANEL - Abnormal; Notable for the following components:      Result Value    Glucose, Bld 114 (*)    BUN 29 (*)    Creatinine, Ser 1.05 (*)    Calcium 8.7 (*)    GFR, Estimated 50 (*)    All other components within normal limits  URINALYSIS, ROUTINE W REFLEX MICROSCOPIC - Abnormal; Notable for the following components:   Hgb urine dipstick TRACE (*)    Leukocytes,Ua TRACE (*)    All other components within normal limits  URINALYSIS, MICROSCOPIC (REFLEX) - Abnormal; Notable for the following components:   Bacteria, UA RARE (*)    All other components within normal limits  CBG MONITORING, ED - Abnormal; Notable for the following components:   Glucose-Capillary 115 (*)    All other components within normal limits  CBC  MAGNESIUM    EKG EKG Interpretation  Date/Time:  Thursday May 15 2022 12:08:56 EDT Ventricular Rate:  76 PR Interval:  233 QRS Duration: 84 QT Interval:  394 QTC Calculation: 443 R Axis:   -21 Text Interpretation: Sinus rhythm Prolonged PR interval Borderline left axis deviation Low voltage, precordial leads No significant change since last tracing Confirmed by Cindee Lame [phone]) on 05/15/2022 1:27:10 PM  Radiology No results found.  Procedures Procedures    Medications Ordered in ED Medications - No data to display  ED Course/ Medical Decision Making/ A&P                          Medical Decision Making Amount and/or Complexity of Data Reviewed Labs: ordered. Decision-making details documented in ED Course.    This patient presents to the ED for concern of presyncope, this involves an extensive number of treatment options, and is a complaint that carries with it a high risk of complications and morbidity.  I considered the following differential and admission for this acute, potentially life threatening condition. However, patient is currently very well appearing, HDS, and asymptomatic.   MDM:    DDX for pre-syncope includes but is not limited to:  Consider dehydration, orthostatics as possible cause of patient's  symptoms given the fact that she improved with small fluid bolus. Consider anemia and electrolyte abnormalities as possible etiologies. Consider arrhythmias and ACS, although without associated symptoms, less likely. EKG without ischemic changes. Consider hemorrhage vs CVA, although neuro intact now for 24 hours and no associated other symptoms. Consider infection such as UTI. Given history, exam and workup, low suspicion for HF, ICH (no trauma, headache), stroke (no focal neuro deficits), aortic dissection (no chest pain), or GI bleed (stable hgb). Low suspicion for PE given normal vital signs, absence of chest pain or dyspnea now, no evidence of DVT, no recent surgery/immobilization. Patient well appearing here, plan to discharge the patient home with PMD follow up pending a urine sample to r/o UTI.  Clinical Course as of 05/31/22 1452  Thu May 15, 2022  1325 Glucose-Capillary(!): 115 [HN]  1325 CBC wnl [HN]  [id number] Basic metabolic panel(!) At patient's baseline [HN]  1452 Orthostatics negative, patient feels okay with standing [HN]    Clinical Course User Index [HN] Audley Hose, MD    Labs: I Ordered, and personally interpreted labs.  The pertinent results include:  those listed above  Additional history obtained from chart review, son at bedside.    Social Determinants of Health: Patient lives independently   Disposition:  Patient is signed out to the oncoming ED physician Dr. Darl Householder who is made aware of her history, presentation, exam, workup, and plan.  Plan is to obtain UA to r/o UTI and d/c patient with PCP f/u.   Co morbidities that complicate the patient evaluation  Past Medical History:  Diagnosis Date   Abdominal pain, unspecified site 06/26/2008   ALLERGIC RHINITIS CAUSE UNSPECIFIED 02/02/2008   Annual physical exam 12/26/2012   Diarrhea 06/26/2008   Fibrocystic breast disease    Headache(784.0) 10/01/2006   Hereditary and idiopathic peripheral neuropathy 01/10/2016    HYPERLIPIDEMIA 10/01/2006   HYPERTENSION 10/06/2007   IBS (irritable bowel syndrome)    Insomnia 11/14/2012   Irritable bowel syndrome 09/10/2006   Medicare annual wellness visit, subsequent 12/26/2012   Sees Dr Elinor Parkinson of opthamology.  MGM 5/14      Metatarsal stress fracture 01/20/2016   MIGRAINE HEADACHE 09/10/2006   MVP (mitral valve prolapse)    Onychomycosis 07/09/2014   OSTEOPENIA 09/10/2006   Osteopenia 11/12/2012   Palpitations 09/10/2006   PHOTOPHOBIA 07/20/2009   SKIN TAG 11/09/2006   VAGINITIS, ATROPHIC 10/01/2006   Vitamin D deficiency 07/14/2016     Medicines No orders of the defined types were placed in this encounter.   I have reviewed the patients home medicines and have made adjustments as needed  Problem List / ED Course: Problem List Items Addressed This Visit   None Visit Diagnoses     Dehydration    -  Primary   Weakness       Orthostasis                       This note was created using dictation software, which may contain spelling or grammatical errors.    Audley Hose, MD 05/31/22 [phone]

## 2022-05-15 NOTE — ED Triage Notes (Signed)
Arrived via EMS from [facility]; c/o near syncope; + orthostatic changes on field, w/ increased in HR and decrease in BP. NS 500 ml given en route. EKG with establish 1st degree heart block per EMS; BGL 150s; mentation at baseline. Staes concern for dehydration as she only had ensure this morning and walked at fairly far distance.

## 2022-05-15 NOTE — ED Provider Notes (Signed)
  Physical Exam  BP (!) 143/88   Pulse 66   Temp 98.1 F (36.7 C) (Oral)   Resp 18   Ht 5\' 2"  (1.575 m)   Wt 55.4 kg   SpO2 99%   BMI 22.35 kg/m   Physical Exam  Procedures  Procedures  ED Course / MDM   Clinical Course as of 05/15/22 1647  Thu May 15, 2022  1325 Glucose-Capillary(!): 115 [HN]  1325 CBC wnl [HN]  1308 Basic metabolic panel(!) At patient's baseline [HN]  1452 Orthostatics negative, patient feels okay with standing [HN]    Clinical Course User Index [HN] Audley Hose, MD   Medical Decision Making Care assumed at 3 PM.  Patient is here with orthostasis and weakness.  Labs unremarkable and patient is orthostatic.  Patient was given IV fluids and felt better and signed out pending urinalysis.  4:48 PM Urinalysis showed no ketones and no obvious infection.  Patient has been ambulatory in the ER.  Told her to stay hydrated.  Patient stable for discharge  Problems Addressed: Dehydration: acute illness or injury Orthostasis: acute illness or injury Weakness: acute illness or injury  Amount and/or Complexity of Data Reviewed Labs: ordered. Decision-making details documented in ED Course.          Drenda Freeze, MD 05/15/22 [phone]

## 2022-05-15 NOTE — ED Notes (Signed)
Patient laid flat for 10 minutes prior to orthostatic vitals

## 2022-05-15 NOTE — Discharge Instructions (Signed)
Stay hydrated.  See your doctor for follow-up  Return to ER if you have worse dehydration or weakness or passing out

## 2022-05-15 NOTE — Patient Instructions (Signed)
_______________________________________________________  If your blood pressure at your visit was 140/90 or greater, please contact your primary care physician to follow up on this.  _______________________________________________________  If you are age 87 or older, your body mass index should be between 23-30. Your Body mass index is 22.35 kg/m. If this is out of the aforementioned range listed, please consider follow up with your Primary Care Provider.  If you are age 52 or younger, your body mass index should be between 19-25. Your Body mass index is 22.35 kg/m. If this is out of the aformentioned range listed, please consider follow up with your Primary Care Provider.   Your provider has requested that you go to the basement level for lab work before leaving today. Press "B" on the elevator. The lab is located at the first door on the left as you exit the elevator.   Start Miralax 1-2  doses as needed.  Start Benefiber  2 teaspoons in 8 ounces of liquid daily.   You have been scheduled for a CT scan of the abdomen and pelvis at [facility], 1st floor Radiology. You are scheduled on 3/28 at [facility] should arrive 3:30pm to your appointment time for registration and to drink contrast. Please follow the written instructions below on the day of your exam:   1) Do not eat anything after 1pm (4 hours prior to your test)    You may take any medications as prescribed with a small amount of water, if necessary. If you take any of the following medications: METFORMIN, GLUCOPHAGE, GLUCOVANCE, AVANDAMET, RIOMET, FORTAMET, [place] MET, JANUMET, GLUMETZA or METAGLIP, you MAY be asked to HOLD this medication 48 hours AFTER the exam.   The purpose of you drinking the oral contrast is to aid in the visualization of your intestinal tract. The contrast solution may cause some diarrhea. Depending on your individual set of symptoms, you may also receive an intravenous injection of x-ray  contrast/dye. Plan on being at [facility] for 45 minutes or longer, depending on the type of exam you are having performed.   If you have any questions regarding your exam or if you need to reschedule, you may call [facility] at [phone] between the hours of 8:00 am and 5:00 pm, Monday-Friday.    The [facility] providers would like to encourage you to use [facility] to communicate with providers for non-urgent requests or questions.  Due to long hold times on the telephone, sending your provider a message by [facility] may be a faster and more efficient way to get a response.  Please allow 48 business hours for a response.  Please remember that this is for non-urgent requests.   It was a pleasure to see you today!  Thank you for trusting me with your gastrointestinal care!

## 2022-05-28 ENCOUNTER — Encounter: Payer: Self-pay | Admitting: Physician Assistant

## 2022-05-28 ENCOUNTER — Ambulatory Visit: Payer: Medicare Other | Admitting: Physician Assistant

## 2022-05-28 VITALS — BP 162/86 | HR 89 | Resp 18 | Ht 62.0 in | Wt 125.0 lb

## 2022-05-28 DIAGNOSIS — R413 Other amnesia: Secondary | ICD-10-CM

## 2022-05-28 NOTE — Patient Instructions (Addendum)
It was a pleasure to see you today at our office.   Recommendations:  Follow up in  6 months Continue donepezil 10 mg daily, side effects discussed Recommend good control of cardiovascular risk factors Continue to control mood   Patient scheduled for neuropsych evaluation in July 2024 for clarity of the diagnosis and disease trajectory For guidance regarding [facility] and if placement were needed at the facility, contact Arnell Asal, Social Worker tel: [phone]  For assessment of decision of mental capacity and competency:  Call Dr. Anthoney Harada, geriatric psychiatrist at [phone] Counseling regarding caregiver distress, including caregiver depression, anxiety and issues regarding community resources, adult day care programs, adult living facilities, or memory care questions:  please contact your  Primary Doctor's Social Worker  Whom to call: Memory  decline, memory medications: Call our office [phone]  For psychiatric meds, mood meds: Please have your primary care physician manage these medications.  If you have any severe symptoms of a stroke, or other severe issues such as confusion,severe chills or fever, etc call 911 or go to the ER as you may need to be evaluated further       RECOMMENDATIONS FOR ALL PATIENTS WITH MEMORY PROBLEMS: 1. Continue to exercise (Recommend 30 minutes of walking everyday, or 3 hours every week) 2. Increase social interactions - continue going to [place] and enjoy social gatherings with friends and family 3. Eat healthy, avoid fried foods and eat more fruits and vegetables 4. Maintain adequate blood pressure, blood sugar, and blood cholesterol level. Reducing the risk of stroke and cardiovascular disease also helps promoting better memory. 5. Avoid stressful situations. Live a simple life and avoid aggravations. Organize your time and prepare for the next day in anticipation. 6. Sleep well, avoid any interruptions of sleep  and avoid any distractions in the bedroom that may interfere with adequate sleep quality 7. Avoid sugar, avoid sweets as there is a strong link between excessive sugar intake, diabetes, and cognitive impairment We discussed the Mediterranean diet, which has been shown to help patients reduce the risk of progressive memory disorders and reduces cardiovascular risk. This includes eating fish, eat fruits and green leafy vegetables, nuts like almonds and hazelnuts, walnuts, and also use olive oil. Avoid fast foods and fried foods as much as possible. Avoid sweets and sugar as sugar use has been linked to worsening of memory function.  There is always a concern of gradual progression of memory problems. If this is the case, then we may need to adjust level of care according to patient needs. Support, both to the patient and caregiver, should then be put into place.    FALL PRECAUTIONS: Be cautious when walking. Scan the area for obstacles that may increase the risk of trips and falls. When getting up in the mornings, sit up at the edge of the bed for a few minutes before getting out of bed. Consider elevating the bed at the head end to avoid drop of blood pressure when getting up. Walk always in a well-lit room (use night lights in the walls). Avoid area rugs or power cords from appliances in the middle of the walkways. Use a walker or a cane if necessary and consider physical therapy for balance exercise. Get your eyesight checked regularly.  FINANCIAL OVERSIGHT: Supervision, especially oversight when making financial decisions or transactions is also recommended.  HOME SAFETY: Consider the safety of the kitchen when operating appliances like stoves, microwave oven, and blender. Consider having supervision and  share cooking responsibilities until no longer able to participate in those. Accidents with firearms and other hazards in the house should be identified and addressed as well.   ABILITY TO BE LEFT  ALONE: If patient is unable to contact 911 operator, consider using [employer]Line, or when the need is there, arrange for someone to stay with patients. Smoking is a fire hazard, consider supervision or cessation. Risk of wandering should be assessed by caregiver and if detected at any point, supervision and safe proof recommendations should be instituted.  MEDICATION SUPERVISION: Inability to self-administer medication needs to be constantly addressed. Implement a mechanism to ensure safe administration of the medications.   DRIVING: Regarding driving, in patients with progressive memory problems, driving will be impaired. We advise to have someone else do the driving if trouble finding directions or if minor accidents are reported. Independent driving assessment is available to determine safety of driving.   If you are interested in the driving assessment, you can contact the following:  [employer] in [place]  [place] [phone]  [place]  [facility] [phone] or [phone]

## 2022-05-28 NOTE — Progress Notes (Signed)
Assessment/Plan:   Memory Impairment   Christine Henderson is a very pleasant 89 y.o. LH female with a history of hypertension, hyperlipidemia, hyperglycemia, Vit D deficiency seen today in follow up for memory loss. Patient is currently on donepezil 10 mg daily.  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  Today's MMSE is 28/30 improved from prior.      Follow up in  6 months. Continue donepezil 10 mg daily, side effects discussed Recommend good control of cardiovascular risk factors Continue to control mood as per PCP Patient scheduled for Neuropsych evaluation in July 2024 for clarity of the diagnosis and disease trajectory    Subjective:    This patient is accompanied in the office by her son who supplements the history.  Previous records as well as any outside records available were reviewed prior to todays visit. Patient was last seen on 12/04/2021.  Last MMSE on 11/01/2021 was 23/30.  Any changes in memory since last visit? " Fairly good". Son reports more trouble coming up with words especially with places, " needs more time".  She enjoys reading the newspaper, she cannot do any puzzles or word finding.  She also has difficulty with making decisions. She watches "Dateline". repeats oneself?  Endorsed "not too often" Disoriented when walking into a room?  Patient denies except occasionally not remembering what patient came to the room for    Leaving objects in unusual places?  denies   Wandering behavior?  denies   Any personality changes since last visit?  denies   Any worsening depression?:  denies   Hallucinations or  paranoia?  denies   Seizures?    denies    Any sleep changes?  Denies vivid dreams, REM behavior or sleepwalking   Sleep apnea?   denies   Any hygiene concerns?    denies   Independent of bathing and dressing?  Endorsed  Does the patient needs help with medications?  Patient is in charge  Who is in charge of the finances?  Patient is in charge with her son supervision Any changes in appetite?  Denies. Not drinking enough water.      Patient have trouble swallowing?  denies   Does the patient cook?  No any kitchen accidents such as leaving the stove on? Patient denies   Any headaches?   denies   Chronic back pain  denies   Ambulates with difficulty?     Denies. Uses a right cane for stability   Recent falls or head injuries? Denies  Unilateral weakness, numbness or tingling? denies   Any tremors?  denies   Any anosmia?  Patient denies   Any incontinence of urine? Occasionally. Uses Depends  Any bowel dysfunction?  Chronic constipation, received a purge, has some bowel incontinence.  followed by GI Patient lives  alone   Does the patient drive?  Only short distances     Initial Visit 11/01/21 How long did patient have memory difficulties?  Over the last 6 months she had difficulty with names of people.  However, prior to that, he had noticed that the patient had shown some signs of to 4 years ago.  "Last night she went to eat and could not make a decision ".  She enjoys reading the newspaper daily, but cannot do any crossword puzzles or word finding.  Short-term memory is worse than long-term memory.  Patient lives with:  Patient lives alone repeats oneself?  Occasionally "not too often" Disoriented when walking into a room?  Patient denies   Leaving objects in unusual places?  Patient denies   Ambulates  with difficulty?   Patient denies, although "she has more balance issues than prior "-her son says Recent falls?  About 2 months ago, she had a mechanical fall in the bathtub, without loss  of consciousness.  She did not seek medical attention.   Any head injuries?  Patient denies   History of seizures?   Patient denies   Wandering behavior?  Patient denies   Patient drives? Short distances only.  About 2 years ago, the patient had one episode in which he got lost, without recurrence.    Any mood changes?  Patient denies   Any history of depression?:  Patient denies   Hallucinations?  Patient denies   Paranoia?  Patient denies   Patient reports that sleeps well without vivid dreams, REM behavior or sleepwalking . Takes melatonin prn  History of sleep apnea?  Patient denies   Any hygiene concerns?  Patient denies   Independent of bathing and dressing?  Endorsed  Does the patient needs help with medications?  Patient denies   Who is in charge of the finances?  Patient is in charge  with son's supervision  Any changes in appetite?  Does not eat as much "food does not taste as good"  Patient have trouble swallowing? Patient denies   Does the patient cook?  Patient denies   Any kitchen accidents such as leaving the stove on? Patient denies   Any headaches?  Patient denies   Double vision? Patient denies   Any focal numbness or tingling?  Patient denies   Chronic back pain Patient denies   Unilateral weakness?  Patient denies   Any tremors?  Patient denies   Any history of anosmia?  Patient denies   Any incontinence of urine?  Patient denies   Any bowel dysfunction?   Patient denies   History of heavy alcohol intake?  Patient denies   History of heavy tobacco use?  Patient denies   Family history of dementia?     Mother, Father, and Sister  had  Alzheimer's disease     MRI brain 11/17/21 personally reviewed  was remarkable for moderate chronic small vessel ischemic changes within the cerebral white matter. 4. Fairly numerous chronic microhemorrhages within the supratentorial brain, greatest within the bilateral occipital lobes suspicious for sequelae of cerebral amyloid  angiopathy, chronic blood products from remote subarachnoid hemorrhage, moderate generalized cerebral atrophy.   Pertinent labs : 10/29/2021: TSH 5.25, A1c 6.1, total cholesterol 218, LDL 125 PREVIOUS MEDICATIONS:   CURRENT MEDICATIONS:  Outpatient Encounter Medications as of 05/28/2022  Medication Sig   Ascorbic Acid (VITAMIN C) 1000 MG tablet Take 1,000 mg by mouth daily.   azelastine (ASTELIN) 0.1 % nasal spray Place 2 sprays into both nostrils 2 (two) times daily. Use in each nostril as directed   Calcium Carbonate-Vitamin D (CALCIUM PLUS VITAMIN D PO) Take 1,200 tablets by mouth daily.   donepezil (ARICEPT) 10 MG tablet TAKE 1 TABLET BY MOUTH DAILY   fish oil-omega-3 fatty acids 1000 MG capsule Take 2 g by mouth daily.   Glucosamine-Chondroit-Vit C-Mn (GLUCOSAMINE 1500 COMPLEX) CAPS Take 1,500 capsules by mouth 2 (two) times daily.  hydrocortisone 2.5 % cream Apply topically 2 (two) times daily.   losartan (COZAAR) 25 MG tablet TAKE 1/2 TO 1 TABLET BY  MOUTH DAILY   Multiple Vitamin (MULTIVITAMIN) tablet Take 1 tablet by mouth daily.   simvastatin (ZOCOR) 10 MG tablet TAKE 1 TABLET BY MOUTH AT  BEDTIME   No facility-administered encounter medications on file as of 05/28/2022.       05/28/2022   10:00 AM 11/01/2021   10:00 AM 01/13/2017   10:32 AM  MMSE - Mini Mental State Exam  Orientation to time 5 5 5   Orientation to Place 5 5 5   Registration 3 3 3   Attention/ Calculation 4 1 5   Recall 3 3 3   Language- name 2 objects 2 2 2   Language- repeat 1 0 1  Language- follow 3 step command 3 3 3   Language- read & follow direction 1 1 1   Write a sentence 1 0 1  Copy design 0 0 1  Total score 28 23 30        No data to display          Objective:     PHYSICAL EXAMINATION:    VITALS:   Vitals:   05/28/22 0909  BP: (!) 162/86  Pulse: 89  Resp: 18  SpO2: 96%  Weight: 125 lb (56.7 kg)  Height: 5\' 2"  (1.575 m)    GEN:  The patient appears stated age and is in  NAD. HEENT:  Normocephalic, atraumatic.   Neurological examination:  General: NAD, well-groomed, appears stated age. Orientation: The patient is alert. Oriented to person, place and date Cranial nerves: There is good facial symmetry.The speech is fluent and clear. No aphasia or dysarthria. Fund of knowledge is appropriate. Recent and remote memory are impaired. Attention and concentration are normal able to name objects and repeat phrases.  Hearing is intact to conversational tone.  Sensation: Sensation is intact to light touch throughout Motor: Strength is at least antigravity x4. DTR's 2/4 in UE/LE     Movement examination: Tone: There is normal tone in the UE/LE Abnormal movements:  no tremor.  No myoclonus.  No asterixis.   Coordination:  There is no decremation with RAM's. Normal finger to nose  Gait and Station: The patient has no difficulty arising out of a deep-seated chair without the use of the hands. The patient's stride length is good.  Gait is cautious and narrow.    Thank you for allowing [place] the opportunity to participate in the care of this nice patient. Please do not hesitate to contact us for any questions or concerns.   Total time spent on today's visit was 27 minutes dedicated to this patient today, preparing to see patient, examining the patient, ordering tests and/or medications and counseling the patient, documenting clinical information in the EHR or other health record, independently interpreting results and communicating results to the patient/family, discussing treatment and goals, answering patient's questions and coordinating care.  Cc:  Mosie Lukes, MD  Sharene Butters 05/28/2022 10:12 AM

## 2022-05-29 ENCOUNTER — Ambulatory Visit (HOSPITAL_COMMUNITY)
Admission: RE | Admit: 2022-05-29 | Discharge: 2022-05-29 | Disposition: A | Discharge status: Home / Self Care | Payer: Medicare Other | Source: Ambulatory Visit | Attending: Physician Assistant | Admitting: Physician Assistant

## 2022-05-29 DIAGNOSIS — K7689 Other specified diseases of liver: Secondary | ICD-10-CM | POA: Diagnosis not present

## 2022-05-29 DIAGNOSIS — N8110 Cystocele, unspecified: Secondary | ICD-10-CM | POA: Diagnosis not present

## 2022-05-29 DIAGNOSIS — Q676 Pectus excavatum: Secondary | ICD-10-CM | POA: Insufficient documentation

## 2022-05-29 DIAGNOSIS — R159 Full incontinence of feces: Secondary | ICD-10-CM | POA: Diagnosis present

## 2022-05-29 DIAGNOSIS — R194 Change in bowel habit: Secondary | ICD-10-CM | POA: Insufficient documentation

## 2022-05-29 DIAGNOSIS — I700 Atherosclerosis of aorta: Secondary | ICD-10-CM | POA: Diagnosis not present

## 2022-05-29 MED ORDER — IOHEXOL 300 MG/ML  SOLN
80.0000 mL | Freq: Once | INTRAMUSCULAR | Status: AC | PRN
  Administered 2022-05-29: 80 mL via INTRAVENOUS

## 2022-05-29 MED ORDER — IOHEXOL 9 MG/ML PO SOLN: 1000.0000 mL | ORAL | Status: AC

## 2022-05-29 MED ORDER — IOHEXOL 9 MG/ML PO SOLN
ORAL | Status: AC
  Filled 2022-05-29: qty 1000

## 2022-05-31 NOTE — Progress Notes (Signed)
Addendum: Reviewed and agree with assessment and management plan. Gina Leblond M, MD  

## 2022-06-04 ENCOUNTER — Telehealth: Payer: Self-pay | Admitting: Physician Assistant

## 2022-06-04 NOTE — Telephone Encounter (Signed)
Spoke with the patient's son and discussed the CT results. See the CT of 06/01/22 for details.

## 2022-06-04 NOTE — Telephone Encounter (Signed)
Patient son is returning your phone call. The best phone number is [phone].

## 2022-06-06 ENCOUNTER — Ambulatory Visit: Payer: Medicare Other | Admitting: Physician Assistant

## 2022-06-09 ENCOUNTER — Other Ambulatory Visit: Payer: Self-pay | Admitting: Physician Assistant

## 2022-06-09 ENCOUNTER — Other Ambulatory Visit: Payer: Self-pay | Admitting: Family Medicine

## 2022-06-09 DIAGNOSIS — I10 Essential (primary) hypertension: Secondary | ICD-10-CM

## 2022-06-13 ENCOUNTER — Other Ambulatory Visit: Payer: Self-pay

## 2022-06-13 DIAGNOSIS — R935 Abnormal findings on diagnostic imaging of other abdominal regions, including retroperitoneum: Secondary | ICD-10-CM

## 2022-06-13 NOTE — Telephone Encounter (Signed)
Spoke with Christine Henderson. Patient is ready to schedule the MRI. Order placed. Scheduling notified.

## 2022-06-13 NOTE — Telephone Encounter (Signed)
Inbound call from patient's son, would like to discuss MRI scheduling based off of the note from CT results. States mother would be interested in proceeding.

## 2022-06-21 ENCOUNTER — Other Ambulatory Visit: Payer: Self-pay | Admitting: Family Medicine

## 2022-06-23 MED ORDER — HYDROCORTISONE 2.5 % EX CREA: TOPICAL_CREAM | Freq: Two times a day (BID) | CUTANEOUS | 0 refills | Status: DC

## 2022-06-23 NOTE — Telephone Encounter (Signed)
Pt saw Dr. Carmelia Roller 2 months ago.       Patient Comment: Christine Henderson is still having bleeding and much pain in her rectum area beacuse  of the hemeroids, is there anything like Preperation H or someting else she can take for this?

## 2022-06-24 ENCOUNTER — Other Ambulatory Visit: Payer: Self-pay

## 2022-06-24 ENCOUNTER — Encounter: Payer: Self-pay | Admitting: Internal Medicine

## 2022-06-24 MED ORDER — HYDROCORTISONE ACETATE 25 MG RE SUPP: 25.0000 mg | Freq: Every evening | RECTAL | 0 refills | Status: DC

## 2022-06-24 NOTE — Telephone Encounter (Signed)
Will try hydrocortisone suppository 25 mg nightly x 5-7 nights If not approved this might be cheaper buying without insurance using the GoodRx program.  This can be checked online to find out where it is cheapest If she having more normal bowel movements?  Constipation exacerbated by rectal prolapse. Follow-up with me or Amy If persistent may need flex sig

## 2022-06-26 ENCOUNTER — Ambulatory Visit (HOSPITAL_COMMUNITY)
Admission: RE | Admit: 2022-06-26 | Discharge: 2022-06-26 | Disposition: A | Discharge status: Home / Self Care | Payer: Medicare Other | Source: Ambulatory Visit | Attending: Physician Assistant | Admitting: Physician Assistant

## 2022-06-26 DIAGNOSIS — R935 Abnormal findings on diagnostic imaging of other abdominal regions, including retroperitoneum: Secondary | ICD-10-CM | POA: Diagnosis not present

## 2022-06-26 DIAGNOSIS — K769 Liver disease, unspecified: Secondary | ICD-10-CM | POA: Diagnosis not present

## 2022-06-26 MED ORDER — GADOBUTROL 1 MMOL/ML IV SOLN
5.5000 mL | Freq: Once | INTRAVENOUS | Status: AC | PRN
  Administered 2022-06-26: 5.5 mL via INTRAVENOUS

## 2022-07-07 ENCOUNTER — Other Ambulatory Visit: Payer: Self-pay

## 2022-07-08 ENCOUNTER — Encounter: Payer: Self-pay | Admitting: Family Medicine

## 2022-07-10 ENCOUNTER — Ambulatory Visit (INDEPENDENT_AMBULATORY_CARE_PROVIDER_SITE_OTHER): Payer: Medicare Other | Admitting: Family Medicine

## 2022-07-10 ENCOUNTER — Encounter: Payer: Self-pay | Admitting: Family Medicine

## 2022-07-10 VITALS — BP 126/84 | HR 92 | Ht 62.0 in | Wt 128.0 lb

## 2022-07-10 DIAGNOSIS — R600 Localized edema: Secondary | ICD-10-CM

## 2022-07-10 DIAGNOSIS — R7989 Other specified abnormal findings of blood chemistry: Secondary | ICD-10-CM | POA: Diagnosis not present

## 2022-07-10 NOTE — Patient Instructions (Signed)
Likely dependent edema given presentation. Consider vascular referral for varicose veins.  Checking labs today to look for other potential underlying causes. We discussed d-dimer and possibility of false positives.  Recommend starting with compression socks, elevation (heart level as often as possible when seated during the day), low-sodium diet. Please contact office for follow-up if symptoms do not improve or worsen. Seek emergency care if symptoms become severe.

## 2022-07-10 NOTE — Telephone Encounter (Signed)
Called pt lvm for her son to call us back regarding getting  Her in today with Hyman Hopes, Np.

## 2022-07-10 NOTE — Progress Notes (Signed)
   Acute Office Visit  Subjective:     Patient ID: Christine Henderson, female    DOB: 1931/02/06, 89 y.o.   MRN: [id number]  Chief Complaint  Patient presents with   Edema    HPI Patient is in today for bilateral  lower extremity edema. She is here today with her son.   She reports some gradual leg swelling over the past few weeks. States both legs swell from feet up to thighs at times, making them sore and achy. No sharp pains, rashes, sores, redness, warmth, chest pain, dyspnea, coughing. She has not had any recent diet or activity changes. States she tried some OTC cream, but that did not do anything.  She had an echo last fall with EF 60-65% and most recent renal function 2 months ago was at baseline.    Wt Readings from Last 3 Encounters:  07/10/22 128 lb (58.1 kg)  05/28/22 125 lb (56.7 kg)  05/15/22 122 lb 3.2 oz (55.4 kg)      ROS All review of systems negative except what is listed in the HPI      Objective:    BP 126/84   Pulse 92   Ht 5\' 2"  (1.575 m)   Wt 128 lb (58.1 kg)   SpO2 95%   BMI 23.41 kg/m    Physical Exam Vitals reviewed.  Constitutional:      Appearance: Normal appearance.  Cardiovascular:     Rate and Rhythm: Normal rate and regular rhythm.  Pulmonary:     Effort: Pulmonary effort is normal.     Breath sounds: Normal breath sounds. No wheezing, rhonchi or rales.  Musculoskeletal:     Right lower leg: Edema present.     Left lower leg: Edema present.     Comments: BLE edema 2+, no erythema, warmth, negative Homan's sign bilaterally; varicose veins- left leg worse than right   Psychiatric:        Mood and Affect: Mood normal.        Behavior: Behavior normal.        Thought Content: Thought content normal.        Judgment: Judgment normal.     No results found for any visits on 07/10/22.      Assessment & Plan:   Problem List Items Addressed This Visit   None Visit Diagnoses     Bilateral lower extremity edema    -   Primary Likely dependent edema given presentation. Consider vascular referral for varicose veins.  Checking labs today to look for other potential underlying causes. We discussed d-dimer and possibility of false positives.  Recommend starting with compression socks, elevation (heart level as often as possible when seated during the day), low-sodium diet. Patient aware of signs/symptoms requiring further/urgent evaluation.  Follow-up pending labs.      Relevant Orders   Comprehensive metabolic panel   B Nat Peptide   D-Dimer, Quantitative       No orders of the defined types were placed in this encounter.   Return if symptoms worsen or fail to improve.  Clayborne Dana, NP

## 2022-07-11 ENCOUNTER — Telehealth: Payer: Self-pay | Admitting: *Deleted

## 2022-07-11 LAB — COMPREHENSIVE METABOLIC PANEL
ALT: 17 U/L (ref 0–35)
AST: 25 U/L (ref 0–37)
Albumin: 4 g/dL (ref 3.5–5.2)
Alkaline Phosphatase: 55 U/L (ref 39–117)
BUN: 32 mg/dL — ABNORMAL HIGH (ref 6–23)
CO2: 27 mEq/L (ref 19–32)
Calcium: 9.2 mg/dL (ref 8.4–10.5)
Chloride: 102 mEq/L (ref 96–112)
Creatinine, Ser: 1.05 mg/dL (ref 0.40–1.20)
GFR: 46.84 mL/min — ABNORMAL LOW (ref >60.00)
Glucose, Bld: 122 mg/dL — ABNORMAL HIGH (ref 70–99)
Potassium: 4.2 mEq/L (ref 3.5–5.1)
Sodium: 139 mEq/L (ref 135–145)
Total Bilirubin: 0.6 mg/dL (ref 0.2–1.2)
Total Protein: 7.1 g/dL (ref 6.0–8.3)

## 2022-07-11 LAB — D-DIMER, QUANTITATIVE: D-Dimer, Quant: 1.33 mcg/mL FEU — ABNORMAL HIGH (ref <0.50)

## 2022-07-11 NOTE — Addendum Note (Signed)
Addended by: Hyman Hopes B on: 07/11/2022 08:29 AM   Modules accepted: Orders

## 2022-07-11 NOTE — Telephone Encounter (Signed)
Received call from [facility] lab.  They are not able to perform the BNP on the sample that was sent to them and sample will need to be recollected.  Notified pt's son. He states he is unable to bring her back today and it will be Monday before he will have someone that can bring pt back for redraw.  I did schedule pt for Monday at 9:30.  Pt's son states pt started wearing the compression yesterday evening. Pt thinks there may be a slight decrease in swelling but says it is hard to tell. Does note that leg pain is a little less.

## 2022-07-14 ENCOUNTER — Other Ambulatory Visit: Payer: Self-pay | Admitting: Family Medicine

## 2022-07-14 ENCOUNTER — Other Ambulatory Visit (INDEPENDENT_AMBULATORY_CARE_PROVIDER_SITE_OTHER): Payer: Medicare Other

## 2022-07-14 DIAGNOSIS — R600 Localized edema: Secondary | ICD-10-CM

## 2022-07-14 LAB — BRAIN NATRIURETIC PEPTIDE: Pro B Natriuretic peptide (BNP): 212 pg/mL — ABNORMAL HIGH (ref 0.0–100.0)

## 2022-07-14 MED ORDER — FUROSEMIDE 20 MG PO TABS: 20.0000 mg | ORAL_TABLET | Freq: Every day | ORAL | 0 refills | Status: DC

## 2022-07-14 NOTE — Progress Notes (Signed)
No charge repeat labs  

## 2022-07-16 ENCOUNTER — Other Ambulatory Visit (HOSPITAL_BASED_OUTPATIENT_CLINIC_OR_DEPARTMENT_OTHER): Payer: Medicare Other

## 2022-07-16 ENCOUNTER — Ambulatory Visit (HOSPITAL_BASED_OUTPATIENT_CLINIC_OR_DEPARTMENT_OTHER)
Admission: RE | Admit: 2022-07-16 | Discharge: 2022-07-16 | Disposition: A | Discharge status: Home / Self Care | Payer: Medicare Other | Source: Ambulatory Visit | Attending: Family Medicine | Admitting: Family Medicine

## 2022-07-16 DIAGNOSIS — R600 Localized edema: Secondary | ICD-10-CM | POA: Insufficient documentation

## 2022-07-16 DIAGNOSIS — R7989 Other specified abnormal findings of blood chemistry: Secondary | ICD-10-CM | POA: Diagnosis not present

## 2022-07-16 NOTE — Progress Notes (Unsigned)
   Established Patient Office Visit  Subjective   Patient ID: AYALA SANTELL, female    DOB: 1930/04/25  Age: 89 y.o. MRN: [id number]  No chief complaint on file.   HPI  Patient is here for edema follow-up.   She was seen last week with bilateral lower extremity edema and pain. D-dimer was postivie and venous US was ordered ***. BNP was mildly elevated and renal function was at baseline, so she was given 2 days of lasix and encouraged to wear compression socks, elevated legs throughout the day, and minimize sodium intake. Today she reports ***.    {History (Optional):23778}  ROS    Objective:     There were no vitals taken for this visit. {Vitals History (Optional):23777}  Physical Exam   No results found for any visits on 07/17/22.  {Labs (Optional):23779}  The ASCVD Risk score (Arnett DK, et al., 2019) failed to calculate for the following reasons:   The 2019 ASCVD risk score is only valid for ages 46 to 66    Assessment & Plan:   Problem List Items Addressed This Visit   None   No follow-ups on file.    Clayborne Dana, NP

## 2022-07-17 ENCOUNTER — Ambulatory Visit (INDEPENDENT_AMBULATORY_CARE_PROVIDER_SITE_OTHER): Payer: Medicare Other | Admitting: Family Medicine

## 2022-07-17 ENCOUNTER — Encounter: Payer: Self-pay | Admitting: Family Medicine

## 2022-07-17 VITALS — BP 110/58 | HR 93 | Ht 62.0 in | Wt 123.0 lb

## 2022-07-17 DIAGNOSIS — N289 Disorder of kidney and ureter, unspecified: Secondary | ICD-10-CM

## 2022-07-17 DIAGNOSIS — M7122 Synovial cyst of popliteal space [Baker], left knee: Secondary | ICD-10-CM

## 2022-07-17 DIAGNOSIS — R600 Localized edema: Secondary | ICD-10-CM | POA: Diagnosis not present

## 2022-07-17 LAB — BASIC METABOLIC PANEL
BUN: 39 mg/dL — ABNORMAL HIGH (ref 6–23)
CO2: 24 mEq/L (ref 19–32)
Calcium: 9.4 mg/dL (ref 8.4–10.5)
Chloride: 101 mEq/L (ref 96–112)
Creatinine, Ser: 1.3 mg/dL — ABNORMAL HIGH (ref 0.40–1.20)
GFR: 36.25 mL/min — ABNORMAL LOW (ref >60.00)
Glucose, Bld: 130 mg/dL — ABNORMAL HIGH (ref 70–99)
Potassium: 4.5 mEq/L (ref 3.5–5.1)
Sodium: 137 mEq/L (ref 135–145)

## 2022-07-17 NOTE — Patient Instructions (Signed)
Legs are looking better today! Conitnue the supportive measures - compression socks, elevating your legs several times throughout the day, low sodium diet, staying active.  Repeating kidney function labs today We discussed the bakers cyst - if you start having pain in the area behind your left knee, please let us know

## 2022-07-18 ENCOUNTER — Encounter: Payer: Self-pay | Admitting: Family Medicine

## 2022-07-18 NOTE — Addendum Note (Signed)
Addended by: Hyman Hopes B on: 07/18/2022 07:58 AM   Modules accepted: Orders

## 2022-07-25 ENCOUNTER — Ambulatory Visit: Payer: Medicare Other | Admitting: Physician Assistant

## 2022-07-25 ENCOUNTER — Encounter: Payer: Self-pay | Admitting: Physician Assistant

## 2022-07-25 VITALS — BP 126/80 | HR 89 | Ht 62.0 in | Wt 125.0 lb

## 2022-07-25 DIAGNOSIS — K623 Rectal prolapse: Secondary | ICD-10-CM | POA: Diagnosis not present

## 2022-07-25 NOTE — Progress Notes (Signed)
Subjective:    Patient ID: Christine Henderson, female    DOB: 1930/11/04, 89 y.o.   MRN: [id number]  HPI [name] is a pleasant 89 year old white female, established with Dr. Rhea Belton who comes in today for follow-up.  She had been seen by Dr. Rhea Belton in February 2024 at that time with complaints of rectal pain and was noted to have a small stercoral ulcer and also prolapsed hemorrhoids at anoscopy.  She was given a bowel purge and then instructed to take daily MiraLAX, and a fiber supplement. She was seen by myself on 05/15/2022 with complaints of daily incontinence..  She had been unable to tolerate MiraLAX on a daily basis because it caused diarrhea but had continued Metamucil most days.  She was having regular bowel movements but remained incontinent with no knowledge of passage of stool.  She was not complaining of rectal pain, was occasionally seeing scant amount of blood on the tissue. Patient was not wanting to pursue colonoscopy, but they wanted some further evaluation.  We decided to pursue CT imaging to rule out occult neoplasm. CT on 05/29/2022 showed significant pelvic floor laxity with cystocele, prominent stool in the colon, and potential rectal prolapse, soft tissue thickening in the wall of the [place] low rectum could not be excluded There is a subtle focal region of increased attenuation in the inferior right liver along the gallbladder fossa indeterminant measuring 3 cm and a 11 mm low-density lesion posterior right hepatic lobe could not definitely be characterized as benign, an MRI was recommended.  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  No further workup felt indicated, as of large burden of stool in the colon and cardiomegaly.  Today she says that she is not having any continued sharp rectal discomfort or any pain really  but has a fairly constant sensation of need to have a bowel movement. She is having a bowel movement every morning, she says sometimes she goes back and forth to the bathroom 2 or 3 times does not necessarily pass a lot of stool.  She will have some seepage of stool at times but does not seem to be having regular episodes of complete incontinence.  She will intermittently see some blood on the tissue.  She has no complaints of abdominal pain. She has been intermittently using the hemorrhoidal cream, she is taking a fiber supplement on a daily basis/Benefiber and is not using MiraLAX more than once per week because this seems to cause too much loose stool.  After I did the rectal exam and told her that she definitely has rectal prolapse she says that she has noticed this area protruding from her rectum over the past 6 or 7 weeks she has on occasion tried to push it back in unsuccessfully.  She says it does not hurt but is definitely aware that something is there.  Review of Systems Pertinent positive and negative review of systems were noted in the above HPI section.  All other review of systems was otherwise negative.   Outpatient Encounter Medications as of 07/25/2022  Medication Sig   Ascorbic Acid (VITAMIN C) 1000 MG tablet Take 1,000 mg by mouth daily.   Calcium Carbonate-Vitamin D (CALCIUM PLUS VITAMIN D PO) Take 1,200 tablets by mouth daily.   donepezil (ARICEPT) 10 MG tablet TAKE 1 TABLET BY MOUTH DAILY   fish oil-omega-3  fatty acids 1000 MG capsule Take 2 g by mouth daily.   Glucosamine-Chondroit-Vit C-Mn (GLUCOSAMINE 1500 COMPLEX) CAPS Take 1,500 capsules by mouth 2 (two) times daily.   hydrocortisone 2.5 % cream Apply topically 2 (two) times daily.   losartan (COZAAR) 25 MG tablet Take 0.5-1 tablets (12.5-25 mg total) by mouth daily.   Multiple Vitamin (MULTIVITAMIN) tablet Take 1 tablet by mouth daily.   simvastatin (ZOCOR) 10 MG tablet TAKE 1 TABLET BY MOUTH AT  BEDTIME   azelastine  (ASTELIN) 0.1 % nasal spray Place 2 sprays into both nostrils 2 (two) times daily. Use in each nostril as directed (Patient not taking: Reported on 07/25/2022)   furosemide (LASIX) 20 MG tablet Take 1 tablet (20 mg total) by mouth daily.   hydrocortisone (ANUSOL-HC) 25 MG suppository Place 1 suppository (25 mg total) rectally at bedtime.   No facility-administered encounter medications on file as of 07/25/2022.   Allergies  Allergen Reactions   Codeine     REACTION: Nausea  Vomiting   Patient Active Problem List   Diagnosis Date Noted   Recurrent falls 01/30/2022   Mixed Alzheimer's and vascular dementia (HCC) 12/04/2021   Memory loss 10/25/2021   Disequilibrium 10/25/2021   Heart murmur 10/25/2021   Bilateral impacted cerumen 10/21/2020   Vitamin D deficiency 07/14/2016   Hereditary and idiopathic peripheral neuropathy 01/10/2016   Paresthesia of foot, bilateral 07/09/2014   Hyperglycemia 07/09/2014   Onychomycosis 07/09/2014   Lumbar back pain with radiculopathy affecting left lower extremity 11/13/2013   Back pain 06/27/2013   Preventative health care 12/26/2012   Insomnia 11/14/2012   Osteopenia 11/12/2012   Vasomotor rhinitis 02/02/2008   Essential hypertension 10/06/2007   Hyperlipidemia 10/01/2006   VAGINITIS, ATROPHIC 10/01/2006   Migraine headache 09/10/2006   IRRITABLE BOWEL SYNDROME 09/10/2006   Disorder of bone and cartilage 09/10/2006   Social History   Socioeconomic History   Marital status: Widowed    Spouse name: Not on file   Number of children: 4   Years of education: Not on file   Highest education level: Not on file  Occupational History   Occupation: retired    Associate Professor: RETIRED  Tobacco Use   Smoking status: Never   Smokeless tobacco: Never  Vaping Use   Vaping Use: Never used  Substance and Sexual Activity   Alcohol use: No   Drug use: No   Sexual activity: Never    Birth control/protection: None    Comment: regular exercise, lives at  the [facility], widowed in 2014  Other Topics Concern   Not on file  Social History Narrative   Left handed   Caffeine 3-4 glasses of tea   Lives alone and does her own medicine      Comes in with son stephen   Social Determinants of Health   Financial Resource Strain: Low Risk  (10/11/2020)   Overall Financial Resource Strain (CARDIA)    Difficulty of Paying Living Expenses: Not hard at all  Food Insecurity: No Food Insecurity (10/11/2020)   Hunger Vital Sign    Worried About Running Out of Food in the Last Year: Never true    Ran Out of Food in the Last Year: Never true  Transportation Needs: No Transportation Needs (10/11/2020)   PRAPARE - Administrator, Civil Service (Medical): No    Lack of Transportation (Non-Medical): No  Physical Activity: Insufficiently Active (10/11/2020)   Exercise Vital Sign    Days of Exercise per Week: 4 days  Minutes of Exercise per Session: 30 min  Stress: No Stress Concern Present (10/11/2020)   [employer] of Occupational Health - Occupational Stress Questionnaire    Feeling of Stress : Not at all  Social Connections: Moderately Isolated (10/11/2020)   Social Connection and Isolation Panel [NHANES]    Frequency of Communication with Friends and Family: More than three times a week    Frequency of Social Gatherings with Friends and Family: More than three times a week    Attends Religious Services: More than 4 times per year    Active Member of [employer] or Organizations: No    Attends Banker Meetings: Never    Marital Status: Widowed  Intimate Partner Violence: Not At Risk (10/11/2020)   Humiliation, Afraid, Rape, and Kick questionnaire    Fear of Current or Ex-Partner: No    Emotionally Abused: No    Physically Abused: No    Sexually Abused: No    Ms. Mensinger's family history includes Alzheimer's disease in her mother and sister; Heart disease in her paternal uncle; Hyperlipidemia in her son and son; Inflammatory  bowel disease in her sister; Obesity in her son; Pancreatic cancer in her maternal grandfather.      Objective:    Vitals:   07/25/22 1324  BP: 126/80  Pulse: 89    Physical Exam Well-developed thin, frail-appearing elderly white female in no acute distress.  Very pleasant, accompanied by her son height, Weight, 125 BMI 22.8  HEENT; nontraumatic normocephalic, EOMI, PE R LA, sclera anicteric. Oropharynx; not examined today Neck; supple, no JVD Cardiovascular; regular rate and rhythm with S1-S2, no murmur rub or gallop Pulmonary; Clear bilaterally Abdomen; soft, nontender, nondistended, no palpable mass or hepatosplenomegaly, bowel sounds are active Rectal; patient has obvious rectal prolapse today with at least 5 cm of rectum protruding from the anus, I was able to gently partially reduce this, which she tolerated without difficulty, unable to fully reduce.  Does not appear to have a lot of formed stool in the rectum or any impaction, stool is brown Skin; benign exam, no jaundice rash or appreciable lesions Extremities; no clubbing cyanosis or edema skin warm and dry Neuro/Psych; alert and oriented x4, grossly nonfocal mood and affect appropriate        Assessment & Plan:   #34 89 year old white female with a 5 to 6 cm segment of rectal prolapse, able to partially reduce this manually, unable to completely reduce. Patient has been having a constant sensation of urge for bowel movement, intermittent incontinence and fecal seepage. When she does have bowel movements she also clearly has a sense of incomplete evacuation and has frequent trips back to the bathroom.  #2 pelvic floor laxity on recent CT as well as cystocele  #3 persistent constipation.  #4 abnormal CT with an area of focal increased attenuation in the right liver along the gallbladder fossa, an 11 mm lesion in the right hepatic lobe felt indeterminant. Subsequent MRI done 06/26/2022 showed this to be a flash filling  hemangioma and no further workup felt indicated.  #5 Alzheimer's/functioning well #6 hypertension  Plan; continue fiber supplement every day For now we will continue MiraLAX once or twice weekly She can discontinue hemorrhoidal cream, we discussed use of [place] for any irritation of the rectal prolapse and prophylactically to help prevent friability. She will be referred to Dr. Donny Pique [facility] for consultation.  Patient is not sure that she would want to proceed with surgery but would like to  have consultation.   Gwendalynn Eckstrom Oswald Hillock PA-C 07/25/2022   Cc: Bradd Canary, MD

## 2022-07-25 NOTE — Patient Instructions (Signed)
Continue taking Benefiber as directed.   Keep using K-Y jelly to keep affected area as comfortable as possible.  Urgent referral has been sent to [facility].- Dr Romie Levee. Make certain to bring a list of current medications, including any over the counter medications or vitamins. Also bring your co-pay if you have one as well as your insurance cards.   [facility] is located at [address]. Someone from their office will contact you for an appointment, if you have not heard from their office within 1 week,  please contact them at [phone].    _______________________________________________________  If your blood pressure at your visit was 140/90 or greater, please contact your primary care physician to follow up on this.  _______________________________________________________  If you are age 43 or older, your body mass index should be between 23-30. Your Body mass index is 22.86 kg/m. If this is out of the aforementioned range listed, please consider follow up with your Primary Care Provider.  If you are age 75 or younger, your body mass index should be between 19-25. Your Body mass index is 22.86 kg/m. If this is out of the aformentioned range listed, please consider follow up with your Primary Care Provider.   ________________________________________________________  The [facility] providers would like to encourage you to use [facility] to communicate with providers for non-urgent requests or questions.  Due to long hold times on the telephone, sending your provider a message by [facility] may be a faster and more efficient way to get a response.  Please allow 48 business hours for a response.  Please remember that this is for non-urgent requests.  _______________________________________________________  Thank you for choosing me and [facility].  Amy Esterwood PA-C

## 2022-08-04 NOTE — Progress Notes (Signed)
Addendum: Reviewed and agree with assessment and management plan. Juventino Pavone M, MD  

## 2022-08-13 NOTE — Assessment & Plan Note (Signed)
Well controlled, no changes to meds. Encouraged heart healthy diet such as the DASH diet and exercise as tolerated.  °

## 2022-08-13 NOTE — Assessment & Plan Note (Signed)
Supplement and monitor 

## 2022-08-13 NOTE — Assessment & Plan Note (Signed)
Encouraged to get adequate exercise, calcium and vitamin d intake 

## 2022-08-13 NOTE — Assessment & Plan Note (Signed)
hgba1c acceptable, minimize simple carbs. Increase exercise as tolerated.  

## 2022-08-14 ENCOUNTER — Ambulatory Visit (INDEPENDENT_AMBULATORY_CARE_PROVIDER_SITE_OTHER): Payer: Medicare Other | Admitting: Family Medicine

## 2022-08-14 ENCOUNTER — Encounter: Payer: Self-pay | Admitting: Family Medicine

## 2022-08-14 VITALS — BP 140/92 | HR 76 | Ht 62.0 in | Wt 125.8 lb

## 2022-08-14 DIAGNOSIS — R609 Edema, unspecified: Secondary | ICD-10-CM | POA: Diagnosis not present

## 2022-08-14 DIAGNOSIS — N289 Disorder of kidney and ureter, unspecified: Secondary | ICD-10-CM

## 2022-08-14 DIAGNOSIS — E559 Vitamin D deficiency, unspecified: Secondary | ICD-10-CM

## 2022-08-14 DIAGNOSIS — R739 Hyperglycemia, unspecified: Secondary | ICD-10-CM | POA: Diagnosis not present

## 2022-08-14 DIAGNOSIS — M8580 Other specified disorders of bone density and structure, unspecified site: Secondary | ICD-10-CM | POA: Diagnosis not present

## 2022-08-14 DIAGNOSIS — I10 Essential (primary) hypertension: Secondary | ICD-10-CM | POA: Diagnosis not present

## 2022-08-14 HISTORY — DX: Disorder of kidney and ureter, unspecified: N28.9

## 2022-08-14 HISTORY — DX: Edema, unspecified: R60.9

## 2022-08-14 LAB — LIPID PANEL
Cholesterol: 162 mg/dL (ref 0–200)
HDL: 68.6 mg/dL (ref >39.00)
LDL Cholesterol: 70 mg/dL (ref 0–99)
NonHDL: 93.82
Total CHOL/HDL Ratio: 2
Triglycerides: 117 mg/dL (ref 0.0–149.0)
VLDL: 23.4 mg/dL (ref 0.0–40.0)

## 2022-08-14 LAB — COMPREHENSIVE METABOLIC PANEL
ALT: 17 U/L (ref 0–35)
AST: 27 U/L (ref 0–37)
Albumin: 4.3 g/dL (ref 3.5–5.2)
Alkaline Phosphatase: 56 U/L (ref 39–117)
BUN: 30 mg/dL — ABNORMAL HIGH (ref 6–23)
CO2: 28 mEq/L (ref 19–32)
Calcium: 9.5 mg/dL (ref 8.4–10.5)
Chloride: 98 mEq/L (ref 96–112)
Creatinine, Ser: 1.05 mg/dL (ref 0.40–1.20)
GFR: 46.81 mL/min — ABNORMAL LOW (ref >60.00)
Glucose, Bld: 97 mg/dL (ref 70–99)
Potassium: 4.3 mEq/L (ref 3.5–5.1)
Sodium: 137 mEq/L (ref 135–145)
Total Bilirubin: 0.8 mg/dL (ref 0.2–1.2)
Total Protein: 7.7 g/dL (ref 6.0–8.3)

## 2022-08-14 LAB — CBC WITH DIFFERENTIAL/PLATELET
Basophils Absolute: 0.1 10*3/uL (ref 0.0–0.1)
Basophils Relative: 0.9 % (ref 0.0–3.0)
Eosinophils Absolute: 0.2 10*3/uL (ref 0.0–0.7)
Eosinophils Relative: 2.2 % (ref 0.0–5.0)
HCT: 39.5 % (ref 36.0–46.0)
Hemoglobin: 12.9 g/dL (ref 12.0–15.0)
Lymphocytes Relative: 20.2 % (ref 12.0–46.0)
Lymphs Abs: 1.8 10*3/uL (ref 0.7–4.0)
MCHC: 32.7 g/dL (ref 30.0–36.0)
MCV: 94.8 fl (ref 78.0–100.0)
Monocytes Absolute: 0.7 10*3/uL (ref 0.1–1.0)
Monocytes Relative: 7.4 % (ref 3.0–12.0)
Neutro Abs: 6.2 10*3/uL (ref 1.4–7.7)
Neutrophils Relative %: 69.3 % (ref 43.0–77.0)
Platelets: 343 10*3/uL (ref 150.0–400.0)
RBC: 4.17 Mil/uL (ref 3.87–5.11)
RDW: 14.5 % (ref 11.5–15.5)
WBC: 8.9 10*3/uL (ref 4.0–10.5)

## 2022-08-14 LAB — HEMOGLOBIN A1C: Hgb A1c MFr Bld: 6.3 % (ref 4.6–6.5)

## 2022-08-14 LAB — TSH: TSH: 3.64 u[IU]/mL (ref 0.35–5.50)

## 2022-08-14 LAB — VITAMIN D 25 HYDROXY (VIT D DEFICIENCY, FRACTURES): VITD: 55.78 ng/mL (ref 30.00–100.00)

## 2022-08-14 NOTE — Assessment & Plan Note (Signed)
Improved today but reminded to elevate feet above heart, minimize sodium, stay active and try compression socks. Is not using Lasix but can use it sparingly if symptoms recur

## 2022-08-14 NOTE — Progress Notes (Signed)
Subjective:   By signing my name below, I, Christine Henderson, attest that this documentation has been prepared under the direction and in the presence of Christine Canary, MD., 08/14/2022.   Patient ID: Christine Henderson, female    DOB: [birth year], 89 y.o.   MRN: [id number]  Chief Complaint  Patient presents with   Follow-up    Swelling in legs has subsided    HPI Patient is in today for an office visit and is accompanied by her son. She denies recent hospitalization, febrile illness, CP/palpitations/SOB/HA/fever/chills/GU symptoms.  Bilateral Lower Extremity Swelling Patient currently takes Furosemide 20 mg daily to manage bilateral lower extremity swelling. She denies pain in the legs but is considering cosmetic treatment due to her bulging varicose veins.   Rectal Prolapse  Patient has an upcoming consultation with Dr. Romie Henderson at [facility] to manage her rectal prolapse. She was initially seen by Dr. Rhea Henderson on 04/17/2022 due to rectal pain and was noted to have a small stercoral ulcer and prolapsed hemorrhoids at anoscopy. She was then seen by Christine Henderson on 05/15/2022 and CT scan was completed on 05/29/2022 which revealed significant pelvic floor laxity. 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 She is having daily bowel movements and forming solid stools, but states that she is occasionally feeling constipated. She manages this with Benefiber which has been helpful. She has taken Miralax in the past but this causes diarrhea. Today denies any rectal pain, nausea, vomiting, diarrhea, abdominal pain, or blood in stool. Additionally, she states that she currently takes Ibuprofen 400 mg at bedtime, not for pain management, but to help her sleep.  Past Medical  History:  Diagnosis Date   Abdominal pain, unspecified site 06/26/2008   ALLERGIC RHINITIS CAUSE UNSPECIFIED 02/02/2008   Annual physical exam 12/26/2012   Diarrhea 06/26/2008   Fibrocystic breast disease    Headache(784.0) 10/01/2006   Hereditary and idiopathic peripheral neuropathy 01/10/2016   HYPERLIPIDEMIA 10/01/2006   HYPERTENSION 10/06/2007   IBS (irritable bowel syndrome)    Insomnia 11/14/2012   Irritable bowel syndrome 09/10/2006   Medicare annual wellness visit, subsequent 12/26/2012   Sees Dr Beverlyn Roux of opthamology.  MGM 5/14      Metatarsal stress fracture 01/20/2016   MIGRAINE HEADACHE 09/10/2006   MVP (mitral valve prolapse)    Onychomycosis 07/09/2014   OSTEOPENIA 09/10/2006   Osteopenia 11/12/2012   Palpitations 09/10/2006   PHOTOPHOBIA 07/20/2009   SKIN TAG 11/09/2006   VAGINITIS, ATROPHIC 10/01/2006   Vitamin D deficiency 07/14/2016    Past Surgical History:  Procedure Laterality Date   DENTAL SURGERY  09/2014   ROTATOR CUFF REPAIR Right    TONSILLECTOMY      Family History  Problem Relation Age of Onset   Alzheimer's disease Mother    Inflammatory bowel disease Sister    Alzheimer's disease Sister    Pancreatic cancer Maternal Grandfather        unknown details   Hyperlipidemia Son    Obesity Son    Hyperlipidemia Son    Heart disease Paternal Uncle    Coronary artery disease Neg Hx    Colon cancer Neg Hx    Stomach cancer Neg Hx     Social History   Socioeconomic History   Marital status: Widowed  Spouse name: Not on file   Number of children: 4   Years of education: Not on file   Highest education level: Not on file  Occupational History   Occupation: retired    Associate Professor: RETIRED  Tobacco Use   Smoking status: Never   Smokeless tobacco: Never  Vaping Use   Vaping Use: Never used  Substance and Sexual Activity   Alcohol use: No   Drug use: No   Sexual activity: Never    Birth control/protection: None    Comment: regular exercise,  lives at the [place], widowed in 2014  Other Topics Concern   Not on file  Social History Narrative   Left handed   Caffeine 3-4 glasses of tea   Lives alone and does her own medicine      Comes in with son stephen   Social Determinants of Health   Financial Resource Strain: Low Risk  (10/11/2020)   Overall Financial Resource Strain (CARDIA)    Difficulty of Paying Living Expenses: Not hard at all  Food Insecurity: No Food Insecurity (10/11/2020)   Hunger Vital Sign    Worried About Running Out of Food in the Last Year: Never true    Ran Out of Food in the Last Year: Never true  Transportation Needs: No Transportation Needs (10/11/2020)   PRAPARE - Administrator, Civil Service (Medical): No    Lack of Transportation (Non-Medical): No  Physical Activity: Insufficiently Active (10/11/2020)   Exercise Vital Sign    Days of Exercise per Week: 4 days    Minutes of Exercise per Session: 30 min  Stress: No Stress Concern Present (10/11/2020)   [employer] of Occupational Health - Occupational Stress Questionnaire    Feeling of Stress : Not at all  Social Connections: Moderately Isolated (10/11/2020)   Social Connection and Isolation Panel [NHANES]    Frequency of Communication with Friends and Family: More than three times a week    Frequency of Social Gatherings with Friends and Family: More than three times a week    Attends Religious Services: More than 4 times per year    Active Member of [employer] or Organizations: No    Attends Banker Meetings: Never    Marital Status: Widowed  Intimate Partner Violence: Not At Risk (10/11/2020)   Humiliation, Afraid, Rape, and Kick questionnaire    Fear of Current or Ex-Partner: No    Emotionally Abused: No    Physically Abused: No    Sexually Abused: No    Outpatient Medications Prior to Visit  Medication Sig Dispense Refill   Ascorbic Acid (VITAMIN C) 1000 MG tablet Take 1,000 mg by mouth daily.     Calcium  Carbonate-Vitamin D (CALCIUM PLUS VITAMIN D PO) Take 1,200 tablets by mouth daily.     donepezil (ARICEPT) 10 MG tablet TAKE 1 TABLET BY MOUTH DAILY 90 tablet 1   fish oil-omega-3 fatty acids 1000 MG capsule Take 2 g by mouth daily.     Glucosamine-Chondroit-Vit C-Mn (GLUCOSAMINE 1500 COMPLEX) CAPS Take 1,500 capsules by mouth 2 (two) times daily.     hydrocortisone (ANUSOL-HC) 25 MG suppository Place 1 suppository (25 mg total) rectally at bedtime. 7 suppository 0   hydrocortisone 2.5 % cream Apply topically 2 (two) times daily. 30 g 0   losartan (COZAAR) 25 MG tablet Take 0.5-1 tablets (12.5-25 mg total) by mouth daily. 90 tablet 0   Multiple Vitamin (MULTIVITAMIN) tablet Take 1 tablet by mouth daily.  simvastatin (ZOCOR) 10 MG tablet TAKE 1 TABLET BY MOUTH AT  BEDTIME 90 tablet 3   furosemide (LASIX) 20 MG tablet Take 1 tablet (20 mg total) by mouth daily. (Patient not taking: Reported on 08/14/2022) 2 tablet 0   azelastine (ASTELIN) 0.1 % nasal spray Place 2 sprays into both nostrils 2 (two) times daily. Use in each nostril as directed (Patient not taking: Reported on 07/25/2022) 30 mL 3   No facility-administered medications prior to visit.    Allergies  Allergen Reactions   Codeine     REACTION: Nausea  Vomiting    Review of Systems  Constitutional:  Negative for chills and fever.  Respiratory:  Negative for shortness of breath.   Cardiovascular:  Negative for chest pain and palpitations.  Gastrointestinal:  Positive for constipation. Negative for abdominal pain, blood in stool, diarrhea, nausea and vomiting.  Genitourinary:  Negative for dysuria, frequency, hematuria and urgency.  Skin:           Neurological:  Negative for headaches.       Objective:    Physical Exam Constitutional:      General: She is not in acute distress.    Appearance: Normal appearance. She is not ill-appearing.  HENT:     Head: Normocephalic and atraumatic.     Right Ear: External ear normal.      Left Ear: External ear normal.     Nose: Nose normal.     Mouth/Throat:     Mouth: Mucous membranes are moist.     Pharynx: Oropharynx is clear.  Eyes:     General:        Right eye: No discharge.        Left eye: No discharge.     Extraocular Movements: Extraocular movements intact.     Conjunctiva/sclera: Conjunctivae normal.     Pupils: Pupils are equal, round, and reactive to light.  Cardiovascular:     Rate and Rhythm: Normal rate and regular rhythm.     Pulses: Normal pulses.     Heart sounds: Normal heart sounds. No murmur heard.    No gallop.  Pulmonary:     Effort: Pulmonary effort is normal. No respiratory distress.     Breath sounds: Normal breath sounds. No wheezing or rales.  Abdominal:     General: Bowel sounds are normal.     Palpations: Abdomen is soft.     Tenderness: There is no abdominal tenderness. There is no guarding.  Musculoskeletal:        General: Normal range of motion.     Cervical back: Normal range of motion.     Right lower leg: Edema present.     Left lower leg: Edema present.  Skin:    General: Skin is warm and dry.     Comments: There are bulging varicose veins bilaterally on the lower legs.  Neurological:     Mental Status: She is alert and oriented to person, place, and time.  Psychiatric:        Mood and Affect: Mood normal.        Behavior: Behavior normal.        Judgment: Judgment normal.     BP (!) 140/92 (BP Location: Left Arm, Patient Position: Sitting, Cuff Size: Normal)   Pulse 76   Ht 5\' 2"  (1.575 m)   Wt 125 lb 12.8 oz (57.1 kg)   SpO2 97%   BMI 23.01 kg/m  Wt Readings from Last 3 Encounters:  08/14/22  125 lb 12.8 oz (57.1 kg)  07/25/22 125 lb (56.7 kg)  07/17/22 123 lb (55.8 kg)    Diabetic Foot Exam - Simple   No data filed    Lab Results  Component Value Date   WBC 10.1 05/15/2022   HGB 12.3 05/15/2022   HCT 36.9 05/15/2022   PLT 293 05/15/2022   GLUCOSE 130 (H) 07/17/2022   CHOL 214 (H) 10/29/2021    TRIG 121.0 10/29/2021   HDL 64.70 10/29/2021   LDLCALC 125 (H) 10/29/2021   ALT 17 07/10/2022   AST 25 07/10/2022   NA 137 07/17/2022   K 4.5 07/17/2022   CL 101 07/17/2022   CREATININE 1.30 (H) 07/17/2022   BUN 39 (H) 07/17/2022   CO2 24 07/17/2022   TSH 5.25 10/29/2021   HGBA1C 6.1 10/29/2021    Lab Results  Component Value Date   TSH 5.25 10/29/2021   Lab Results  Component Value Date   WBC 10.1 05/15/2022   HGB 12.3 05/15/2022   HCT 36.9 05/15/2022   MCV 94.9 05/15/2022   PLT 293 05/15/2022   Lab Results  Component Value Date   NA 137 07/17/2022   K 4.5 07/17/2022   CO2 24 07/17/2022   GLUCOSE 130 (H) 07/17/2022   BUN 39 (H) 07/17/2022   CREATININE 1.30 (H) 07/17/2022   BILITOT 0.6 07/10/2022   ALKPHOS 55 07/10/2022   AST 25 07/10/2022   ALT 17 07/10/2022   PROT 7.1 07/10/2022   ALBUMIN 4.0 07/10/2022   CALCIUM 9.4 07/17/2022   ANIONGAP 7 05/15/2022   GFR 36.25 (L) 07/17/2022   Lab Results  Component Value Date   CHOL 214 (H) 10/29/2021   Lab Results  Component Value Date   HDL 64.70 10/29/2021   Lab Results  Component Value Date   LDLCALC 125 (H) 10/29/2021   Lab Results  Component Value Date   TRIG 121.0 10/29/2021   Lab Results  Component Value Date   CHOLHDL 3 10/29/2021   Lab Results  Component Value Date   HGBA1C 6.1 10/29/2021      Assessment & Plan:  Bilateral Lower Extremity Swelling: This controlled with Furosemide 20 mg daily. Recommended compression socks.  Essential Hypertension: This is well-controlled with Losartan 25 mg daily. There are no medication adjustments today.  Hyperglycemia: This is monitored. Encouraged 6-8 hours of sleep, heart healthy diet, 60-80 oz of non-alcohol/non-caffeinated fluids, and 4000-8000 steps daily.  Labs: Routine blood work ordered.  Memory Impairment: This is monitored by Marlowe Kays, PA-C and managed with Donepezil 10 mg daily.  Rectal Prolapse: Patient has an upcoming consultation  with [facility].  Pain Management and Sleep: Encouraged patient to switch from Ibuprofen 400 mg at bedtime to Tylenol as needed.  Vitamin D Deficiency: This is monitored and controlled with supplements. Problem List Items Addressed This Visit     Edema    Improved today but reminded to elevate feet above heart, minimize sodium, stay active and try compression socks. Is not using Lasix but can use it sparingly if symptoms recur      Essential hypertension - Primary    Well controlled, no changes to meds. Encouraged heart healthy diet such as the DASH diet and exercise as tolerated.        Relevant Orders   Lipid panel   Comprehensive metabolic panel   CBC with Differential/Platelet   TSH   Hyperglycemia    hgba1c acceptable, minimize simple carbs. Increase exercise as tolerated.  Relevant Orders   Lipid panel   Hemoglobin A1c   Osteopenia    Encouraged to get adequate exercise, calcium and vitamin d intake       Renal insufficiency    Hydrate and monitor       Vitamin D deficiency    Supplement and monitor       Relevant Orders   VITAMIN D 25 Hydroxy (Vit-D Deficiency, Fractures)   No orders of the defined types were placed in this encounter.  I, Danise Edge, MD, personally preformed the services described in this documentation.  All medical record entries made by the scribe were at my direction and in my presence.  I have reviewed the chart and discharge instructions (if applicable) and agree that the record reflects my personal performance and is accurate and complete. 08/14/2022  I,Mohammed Iqbal,acting as a scribe for Danise Edge, MD.,have documented all relevant documentation on the behalf of Danise Edge, MD,as directed by  Danise Edge, MD while in the presence of Danise Edge, MD.  Danise Edge, MD

## 2022-08-14 NOTE — Patient Instructions (Signed)
Edema  Edema is an abnormal buildup of fluids in the body tissues and under the skin. Swelling of the legs, feet, and ankles is a common symptom that becomes more likely as you get older. Swelling is also common in looser tissues, such as around the eyes. Pressing on the area may make a temporary dent in your skin (pitting edema). This fluid may also accumulate in your lungs (pulmonary edema). There are many possible causes of edema. Eating too much salt (sodium) and being on your feet or sitting for a long time can cause edema in your legs, feet, and ankles. Common causes of edema include: Certain medical conditions, such as heart failure, liver or kidney disease, and cancer. Weak leg blood vessels. An injury. Pregnancy. Medicines. Being obese. Low protein levels in the blood. Hot weather may make edema worse. Edema is usually painless. Your skin may look swollen or shiny. Follow these instructions at home: Medicines Take over-the-counter and prescription medicines only as told by your health care provider. Your health care provider may prescribe a medicine to help your body get rid of extra water (diuretic). Take this medicine if you are told to take it. Eating and drinking Eat a low-salt (low-sodium) diet to reduce fluid as told by your health care provider. Sometimes, eating less salt may reduce swelling. Depending on the cause of your swelling, you may need to limit how much fluid you drink (fluid restriction). General instructions Raise (elevate) the injured area above the level of your heart while you are sitting or lying down. Do not sit still or stand for long periods of time. Do not wear tight clothing. Do not wear garters on your upper legs. Exercise your legs to get your circulation going. This helps to move the fluid back into your blood vessels, and it may help the swelling go down. Wear compression stockings as told by your health care provider. These stockings help to prevent  blood clots and reduce swelling in your legs. It is important that these are the correct size. These stockings should be prescribed by your health care provider to prevent possible injuries. If elastic bandages or wraps are recommended, use them as told by your health care provider. Contact a health care provider if: Your edema does not get better with treatment. You have heart, liver, or kidney disease and have symptoms of edema. You have sudden and unexplained weight gain. Get help right away if: You develop shortness of breath or chest pain. You cannot breathe when you lie down. You develop pain, redness, or warmth in the swollen areas. You have heart, liver, or kidney disease and suddenly get edema. You have a fever and your symptoms suddenly get worse. These symptoms may be an emergency. Get help right away. Call 911. Do not wait to see if the symptoms will go away. Do not drive yourself to the hospital. Summary Edema is an abnormal buildup of fluids in the body tissues and under the skin. Eating too much salt (sodium)and being on your feet or sitting for a long time can cause edema in your legs, feet, and ankles. Raise (elevate) the injured area above the level of your heart while you are sitting or lying down. Follow your health care provider's instructions about diet and how much fluid you can drink. This information is not intended to replace advice given to you by your health care provider. Make sure you discuss any questions you have with your health care provider. Document Revised: 10/22/2020 Document   Reviewed: 10/22/2020 Elsevier Patient Education  2024 [employer]  

## 2022-08-14 NOTE — Assessment & Plan Note (Signed)
Hydrate and monitor 

## 2022-08-18 DIAGNOSIS — K623 Rectal prolapse: Secondary | ICD-10-CM | POA: Diagnosis not present

## 2022-08-25 ENCOUNTER — Other Ambulatory Visit: Payer: Self-pay | Admitting: Family Medicine

## 2022-08-25 DIAGNOSIS — I10 Essential (primary) hypertension: Secondary | ICD-10-CM

## 2022-09-01 ENCOUNTER — Other Ambulatory Visit: Payer: Self-pay | Admitting: Family Medicine

## 2022-09-01 DIAGNOSIS — E785 Hyperlipidemia, unspecified: Secondary | ICD-10-CM

## 2022-09-09 ENCOUNTER — Encounter: Payer: Self-pay | Admitting: Psychology

## 2022-09-10 ENCOUNTER — Ambulatory Visit: Payer: Medicare Other

## 2022-09-10 ENCOUNTER — Ambulatory Visit: Payer: Medicare Other | Admitting: Podiatry

## 2022-09-10 ENCOUNTER — Ambulatory Visit (INDEPENDENT_AMBULATORY_CARE_PROVIDER_SITE_OTHER): Payer: Medicare Other | Admitting: Psychology

## 2022-09-10 ENCOUNTER — Encounter: Payer: Self-pay | Admitting: Podiatry

## 2022-09-10 ENCOUNTER — Encounter: Payer: Self-pay | Admitting: Psychology

## 2022-09-10 VITALS — BP 102/66 | HR 89

## 2022-09-10 DIAGNOSIS — M79674 Pain in right toe(s): Secondary | ICD-10-CM

## 2022-09-10 DIAGNOSIS — G309 Alzheimer's disease, unspecified: Secondary | ICD-10-CM | POA: Diagnosis not present

## 2022-09-10 DIAGNOSIS — F03A0 Unspecified dementia, mild, without behavioral disturbance, psychotic disturbance, mood disturbance, and anxiety: Secondary | ICD-10-CM

## 2022-09-10 DIAGNOSIS — M79675 Pain in left toe(s): Secondary | ICD-10-CM

## 2022-09-10 DIAGNOSIS — F02A0 Dementia in other diseases classified elsewhere, mild, without behavioral disturbance, psychotic disturbance, mood disturbance, and anxiety: Secondary | ICD-10-CM | POA: Diagnosis not present

## 2022-09-10 DIAGNOSIS — B351 Tinea unguium: Secondary | ICD-10-CM

## 2022-09-10 DIAGNOSIS — R4189 Other symptoms and signs involving cognitive functions and awareness: Secondary | ICD-10-CM

## 2022-09-10 HISTORY — DX: Unspecified dementia, mild, without behavioral disturbance, psychotic disturbance, mood disturbance, and anxiety: F03.A0

## 2022-09-10 NOTE — Progress Notes (Signed)
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 110 minutes of cognitive testing and scoring, billed as one unit [phone] and three additional units [id number]. Psychometrist Shan Levans, B.S., assisted Dr. Milbert Coulter with test administration and scoring procedures. As a separate and discrete service, one unit [id number] and two units [phone] were billed for Dr. Tammy Sours time spent in interpretation and report writing.

## 2022-09-10 NOTE — Progress Notes (Signed)
   Psychometrician Note   Cognitive testing was administered to Cherylin Mylar by Shan Levans, B.S. (psychometrist) under the supervision of Dr. Newman Nickels, Ph.D., licensed psychologist on 09/10/2022. Ms. Amundson did not appear overtly distressed by the testing session per behavioral observation or responses across self-report questionnaires. Rest breaks were offered.    The battery of tests administered was selected by Dr. Newman Nickels, Ph.D. with consideration to Ms. Macphee's current level of functioning, the nature of her symptoms, emotional and behavioral responses during interview, level of literacy, observed level of motivation/effort, and the nature of the referral question. This battery was communicated to the psychometrist. Communication between Dr. Newman Nickels, Ph.D. and the psychometrist was ongoing throughout the evaluation and Dr. Newman Nickels, Ph.D. was immediately accessible at all times. Dr. Newman Nickels, Ph.D. provided supervision to the psychometrist on the date of this service to the extent necessary to assure the quality of all services provided.    Ryenn Howeth Georgiou will return within approximately 1-2 weeks for an interactive feedback session with Dr. Milbert Coulter at which time her test performances, clinical impressions, and treatment recommendations will be reviewed in detail. Ms. Giancola understands she can contact our office should she require our assistance before this time.  A total of 110 minutes of billable time were spent face-to-face with Ms. Broyles by the psychometrist. This includes both test administration and scoring time. Billing for these services is reflected in the clinical report generated by Dr. Newman Nickels, Ph.D.  This note reflects time spent with the psychometrician and does not include test scores or any clinical interpretations made by Dr. Milbert Coulter. The full report will follow in a separate note.

## 2022-09-10 NOTE — Progress Notes (Signed)
   Chief Complaint  Patient presents with   Nail Problem    "I've got a toenail problem." N - toenail L - hallux left D - 3-4 years O - gradually worse C - thick,  A - some shoes T - none    SUBJECTIVE Patient presents to office today complaining of elongated, thickened nails that cause pain while ambulating in shoes.  Patient is unable to trim their own nails. Patient is here for further evaluation and treatment.  Past Medical History:  Diagnosis Date   Abdominal pain 06/26/2008   Back pain 06/27/2013   Neck and low back   Bilateral impacted cerumen 10/21/2020   Disequilibrium 10/25/2021   Edema 08/14/2022   Essential hypertension 10/06/2007   Fibrocystic breast disease    Heart murmur 10/25/2021   Hereditary and idiopathic peripheral neuropathy 01/10/2016   Hyperglycemia 07/09/2014   Hyperlipidemia 10/01/2006   Insomnia 11/14/2012   Irritable bowel syndrome 09/10/2006   Lumbar back pain with radiculopathy affecting left lower extremity 11/13/2013   Metatarsal stress fracture 01/20/2016   Migraine headache 09/10/2006   Mild dementia 09/10/2022   MVP (mitral valve prolapse)    Onychomycosis 07/09/2014   Osteopenia 11/12/2012   Palpitations 09/10/2006   Paresthesia of foot, bilateral 07/09/2014   Postmenopausal atrophic vaginitis 10/01/2006   Recurrent falls 01/30/2022   Renal insufficiency 08/14/2022   Vasomotor rhinitis 02/02/2008   Vitamin D deficiency 07/14/2016    Allergies  Allergen Reactions   Codeine     REACTION: Nausea  Vomiting     OBJECTIVE General Patient is awake, alert, and oriented x 3 and in no acute distress. Derm Skin is dry and supple bilateral. Negative open lesions or macerations. Remaining integument unremarkable. Nails are tender, long, thickened and dystrophic with subungual debris, consistent with onychomycosis, 1-5 bilateral. No signs of infection noted. Vasc  DP and PT pedal pulses palpable bilaterally. Temperature gradient within  normal limits.  Neuro Epicritic and protective threshold sensation grossly intact bilaterally.  Musculoskeletal Exam No symptomatic pedal deformities noted bilateral. Muscular strength within normal limits.  ASSESSMENT 1.  Pain due to onychomycosis of toenails both  PLAN OF CARE 1. Patient evaluated today.  2. Instructed to maintain good pedal hygiene and foot care.  3. Mechanical debridement of nails 1-5 bilaterally performed using a nail nipper. Filed with dremel without incident.  4. Return to clinic in 3 mos.    Felecia Shelling, DPM [facility]  Dr. Felecia Shelling, DPM    [address] [place], [place] [address]                Office [phone]  Fax [phone]

## 2022-09-17 ENCOUNTER — Ambulatory Visit: Payer: Medicare Other | Admitting: Psychology

## 2022-09-17 DIAGNOSIS — G301 Alzheimer's disease with late onset: Secondary | ICD-10-CM | POA: Diagnosis not present

## 2022-09-17 DIAGNOSIS — F02A0 Dementia in other diseases classified elsewhere, mild, without behavioral disturbance, psychotic disturbance, mood disturbance, and anxiety: Secondary | ICD-10-CM | POA: Diagnosis not present

## 2022-09-17 NOTE — Progress Notes (Signed)
   Neuropsychology Feedback Session Christine Henderson. [facility] Department of Neurology  Reason for Referral:   Christine Henderson is a 89 y.o. left-handed Caucasian female referred by Christine Kays, PA-C, to characterize her current cognitive functioning and assist with diagnostic clarity and treatment planning in the context of subjective cognitive decline.   Feedback:   [name] completed a comprehensive neuropsychological evaluation on 09/10/2022. Please refer to that encounter for the full report and recommendations. Briefly, results suggested fairly diffuse cognitive dysfunction. A relative strength was exhibited across basic attention, while performance variability was exhibited across visuospatial abilities and recognition/consolidation aspects of memory. Consistent impairment relative to age-matched peers was exhibited across processing speed, cognitive flexibility, verbal fluency, confrontation naming, and both encoding (i.e., learning) and delayed retrieval aspects of memory. The etiology of her mild dementia presentation is somewhat unclear. I do have concerns surrounding the presence of a neurodegenerative illness. Of these illnesses, Alzheimer's disease would appear the most likely. Across memory tasks, [name] had difficulties learning information despite repeated exposure and was fully amnestic (i.e., 0% retention) across 2/3 tasks. She further demonstrated severe impairment surrounding semantic fluency and confrontation naming, which would follow typical disease progression. Neuroimaging also revealed concerns for cerebral amyloid angiopathy, which is an established risk factor for this illness. With that being said, [name] did exhibit some retention of verbal information, observed chiefly across adequate yes/no recognition trial performances. This would not suggest a complete storage impairment, which is certainly encouraging. If Alzheimer's disease is indeed present, it  would appear to still be in somewhat earlier stages based upon memory testing.  [name] was accompanied by her son during the current feedback session. Content of the current session focused on the results of her neuropsychological evaluation. [name] was given the opportunity to ask questions and her questions were answered. She was encouraged to reach out should additional questions arise. A copy of her report was provided at the conclusion of the visit.      One unit [phone] was billed for Dr. Tammy Sours time spent preparing for, conducting, and documenting the current feedback session with Christine Henderson.

## 2022-11-04 ENCOUNTER — Other Ambulatory Visit: Payer: Self-pay | Admitting: Physician Assistant

## 2022-11-19 ENCOUNTER — Emergency Department (HOSPITAL_BASED_OUTPATIENT_CLINIC_OR_DEPARTMENT_OTHER)
Admission: EM | Admit: 2022-11-19 | Discharge: 2022-11-19 | Disposition: A | Discharge status: Home / Self Care | Payer: Medicare Other | Attending: Emergency Medicine | Admitting: Emergency Medicine

## 2022-11-19 ENCOUNTER — Encounter (HOSPITAL_BASED_OUTPATIENT_CLINIC_OR_DEPARTMENT_OTHER): Payer: Self-pay | Admitting: Emergency Medicine

## 2022-11-19 ENCOUNTER — Emergency Department (HOSPITAL_BASED_OUTPATIENT_CLINIC_OR_DEPARTMENT_OTHER): Payer: Medicare Other

## 2022-11-19 ENCOUNTER — Other Ambulatory Visit: Payer: Self-pay

## 2022-11-19 DIAGNOSIS — R42 Dizziness and giddiness: Secondary | ICD-10-CM | POA: Diagnosis not present

## 2022-11-19 DIAGNOSIS — Z79899 Other long term (current) drug therapy: Secondary | ICD-10-CM | POA: Diagnosis not present

## 2022-11-19 DIAGNOSIS — R509 Fever, unspecified: Secondary | ICD-10-CM | POA: Diagnosis not present

## 2022-11-19 DIAGNOSIS — F0390 Unspecified dementia without behavioral disturbance: Secondary | ICD-10-CM | POA: Insufficient documentation

## 2022-11-19 DIAGNOSIS — R531 Weakness: Secondary | ICD-10-CM | POA: Insufficient documentation

## 2022-11-19 DIAGNOSIS — A419 Sepsis, unspecified organism: Secondary | ICD-10-CM | POA: Diagnosis not present

## 2022-11-19 DIAGNOSIS — Z1152 Encounter for screening for COVID-19: Secondary | ICD-10-CM | POA: Diagnosis not present

## 2022-11-19 DIAGNOSIS — I517 Cardiomegaly: Secondary | ICD-10-CM | POA: Diagnosis not present

## 2022-11-19 DIAGNOSIS — R9082 White matter disease, unspecified: Secondary | ICD-10-CM | POA: Diagnosis not present

## 2022-11-19 DIAGNOSIS — R262 Difficulty in walking, not elsewhere classified: Secondary | ICD-10-CM | POA: Diagnosis not present

## 2022-11-19 DIAGNOSIS — R059 Cough, unspecified: Secondary | ICD-10-CM | POA: Insufficient documentation

## 2022-11-19 DIAGNOSIS — R519 Headache, unspecified: Secondary | ICD-10-CM | POA: Insufficient documentation

## 2022-11-19 LAB — URINALYSIS, W/ REFLEX TO CULTURE (INFECTION SUSPECTED)
Bilirubin Urine: NEGATIVE
Glucose, UA: NEGATIVE mg/dL
Hgb urine dipstick: NEGATIVE
Ketones, ur: 15 mg/dL — AB
Leukocytes,Ua: NEGATIVE
Nitrite: NEGATIVE
Protein, ur: 30 mg/dL — AB
Specific Gravity, Urine: 1.02 (ref 1.005–1.030)
pH: 7 (ref 5.0–8.0)

## 2022-11-19 LAB — COMPREHENSIVE METABOLIC PANEL WITH GFR
ALT: 25 U/L (ref 0–44)
AST: 37 U/L (ref 15–41)
Albumin: 3.9 g/dL (ref 3.5–5.0)
Alkaline Phosphatase: 55 U/L (ref 38–126)
Anion gap: 12 (ref 5–15)
BUN: 25 mg/dL — ABNORMAL HIGH (ref 8–23)
CO2: 24 mmol/L (ref 22–32)
Calcium: 9.1 mg/dL (ref 8.9–10.3)
Chloride: 97 mmol/L — ABNORMAL LOW (ref 98–111)
Creatinine, Ser: 0.98 mg/dL (ref 0.44–1.00)
GFR, Estimated: 55 mL/min — ABNORMAL LOW (ref >60)
Glucose, Bld: 137 mg/dL — ABNORMAL HIGH (ref 70–99)
Potassium: 3.8 mmol/L (ref 3.5–5.1)
Sodium: 133 mmol/L — ABNORMAL LOW (ref 135–145)
Total Bilirubin: 0.9 mg/dL (ref 0.3–1.2)
Total Protein: 8.2 g/dL — ABNORMAL HIGH (ref 6.5–8.1)

## 2022-11-19 LAB — CBC WITH DIFFERENTIAL/PLATELET
Abs Immature Granulocytes: 0.02 10*3/uL (ref 0.00–0.07)
Basophils Absolute: 0 10*3/uL (ref 0.0–0.1)
Basophils Relative: 0 %
Eosinophils Absolute: 0 10*3/uL (ref 0.0–0.5)
Eosinophils Relative: 0 %
HCT: 42.2 % (ref 36.0–46.0)
Hemoglobin: 14.1 g/dL (ref 12.0–15.0)
Immature Granulocytes: 0 %
Lymphocytes Relative: 13 %
Lymphs Abs: 0.9 10*3/uL (ref 0.7–4.0)
MCH: 31 pg (ref 26.0–34.0)
MCHC: 33.4 g/dL (ref 30.0–36.0)
MCV: 92.7 fL (ref 80.0–100.0)
Monocytes Absolute: 0.4 10*3/uL (ref 0.1–1.0)
Monocytes Relative: 6 %
Neutro Abs: 5.8 10*3/uL (ref 1.7–7.7)
Neutrophils Relative %: 81 %
Platelets: 242 10*3/uL (ref 150–400)
RBC: 4.55 MIL/uL (ref 3.87–5.11)
RDW: 14.9 % (ref 11.5–15.5)
WBC: 7.2 10*3/uL (ref 4.0–10.5)
nRBC: 0 % (ref 0.0–0.2)

## 2022-11-19 LAB — APTT: aPTT: 26 s (ref 24–36)

## 2022-11-19 LAB — TROPONIN I (HIGH SENSITIVITY)
Troponin I (High Sensitivity): 17 ng/L (ref <18)
Troponin I (High Sensitivity): 20 ng/L — ABNORMAL HIGH (ref <18)

## 2022-11-19 LAB — LACTIC ACID, PLASMA: Lactic Acid, Venous: 1.2 mmol/L (ref 0.5–1.9)

## 2022-11-19 LAB — CBG MONITORING, ED: Glucose-Capillary: 136 mg/dL — ABNORMAL HIGH (ref 70–99)

## 2022-11-19 LAB — SARS CORONAVIRUS 2 BY RT PCR: SARS Coronavirus 2 by RT PCR: NEGATIVE

## 2022-11-19 LAB — PROTIME-INR
INR: 1 (ref 0.8–1.2)
Prothrombin Time: 13 s (ref 11.4–15.2)

## 2022-11-19 MED ORDER — LACTATED RINGERS IV SOLN: INTRAVENOUS | Status: DC

## 2022-11-19 NOTE — Discharge Instructions (Addendum)
You were seen in the emergency department for weakness and a minor fall.  You had a CAT scan of your head along with chest x-ray labs EKG and COVID testing that did not show an obvious explanation for your symptoms.  You were feeling a little bit better and felt you could manage at home.  Please drink plenty of fluids and continue your regular medication.  Follow-up with your primary care doctor.  Return to the emergency department if any worsening or concerning symptoms.

## 2022-11-19 NOTE — ED Triage Notes (Addendum)
Pt reports she woke up from a nap at 1400 and was unable to stand, reports HA 4/10, denies any dizziness, AMS, n/v, CP, changes in vision, facial drooping, or speech changes  Reports still feeling weak in BLE, rt leg weaker than left  Has residual yellowish bruise on rt cheek from a fall 2 weeks ago per pt

## 2022-11-19 NOTE — ED Provider Notes (Signed)
[facility] Provider Note   CSN: [id number] Arrival date & time: 11/19/22  1726     History {Add pertinent medical, surgical, social history, OB history to HPI:1} Chief Complaint  Patient presents with   Weakness    Christine Henderson is a 89 y.o. female.  She is brought in by her family for evaluation of weakness.  Level 5 caveat secondary to dementia.  She lives alone and has a healthcare attendant come by 3 times a week.  It sounds like she slipped down trying to get off the couch and she has not been able to get up off the floor.  She said she was well yesterday.  Had a little bit of a cough few weeks ago and sore throat but that I gotten better.  Had been eating and drinking okay.  Denies any abdominal pain vomiting diarrhea.  Is baseline incontinent of urine.  Has a low-grade temperature here but did not realize was febrile.  The history is provided by the patient and a relative.  Weakness Severity:  Moderate Duration:  1 day Timing:  Constant Progression:  Unchanged Chronicity:  New Relieved by:  Nothing Worsened by:  Activity Ineffective treatments:  Rest Associated symptoms: cough, difficulty walking, dizziness, fever and headaches   Associated symptoms: no abdominal pain and no chest pain        Home Medications Prior to Admission medications   Medication Sig Start Date End Date Taking? Authorizing Provider  Ascorbic Acid (VITAMIN C) 1000 MG tablet Take 1,000 mg by mouth daily.    [provider]  Calcium Carbonate-Vitamin D (CALCIUM PLUS VITAMIN D PO) Take 1,200 tablets by mouth daily.    [provider]  donepezil (ARICEPT) 10 MG tablet TAKE 1 TABLET BY MOUTH DAILY 11/05/22   Marcos Eke, PA-C  fish oil-omega-3 fatty acids 1000 MG capsule Take 2 g by mouth daily.    [provider]  furosemide (LASIX) 20 MG tablet Take 1 tablet (20 mg total) by mouth daily. Patient not taking: Reported on  09/10/2022 07/14/22   Clayborne Dana, NP  Glucosamine-Chondroit-Vit C-Mn (GLUCOSAMINE 1500 COMPLEX) CAPS Take 1,500 capsules by mouth 2 (two) times daily.    [provider]  hydrocortisone (ANUSOL-HC) 25 MG suppository Place 1 suppository (25 mg total) rectally at bedtime. Patient not taking: Reported on 09/10/2022 06/24/22   Beverley Fiedler, MD  hydrocortisone 2.5 % cream Apply topically 2 (two) times daily. 06/23/22   Bradd Canary, MD  losartan (COZAAR) 25 MG tablet TAKE 1/2 TO 1 TABLET BY MOUTH  DAILY 08/25/22   Bradd Canary, MD  Multiple Vitamin (MULTIVITAMIN) tablet Take 1 tablet by mouth daily.    [provider]  simvastatin (ZOCOR) 10 MG tablet TAKE 1 TABLET BY MOUTH AT  BEDTIME 09/01/22   Bradd Canary, MD      Allergies    Codeine    Review of Systems   Review of Systems  Constitutional:  Positive for fever.  Respiratory:  Positive for cough.   Cardiovascular:  Negative for chest pain.  Gastrointestinal:  Negative for abdominal pain.  Neurological:  Positive for dizziness, weakness and headaches.    Physical Exam Updated Vital Signs BP (!) 150/109 (BP Location: Left Arm)   Pulse 88   Temp 99.3 F (37.4 C) (Oral)   Resp 18   Ht 5\' 2"  (1.575 m)   Wt 54.9 kg   SpO2 95%  BMI 22.13 kg/m  Physical Exam Vitals and nursing note reviewed.  Constitutional:      General: She is not in acute distress.    Appearance: Normal appearance. She is well-developed.  HENT:     Head: Normocephalic and atraumatic.  Eyes:     Conjunctiva/sclera: Conjunctivae normal.  Cardiovascular:     Rate and Rhythm: Normal rate and regular rhythm.     Heart sounds: No murmur heard. Pulmonary:     Effort: Pulmonary effort is normal. No respiratory distress.     Breath sounds: Normal breath sounds.  Abdominal:     Palpations: Abdomen is soft.     Tenderness: There is no abdominal tenderness. There is no guarding or rebound.  Musculoskeletal:        General: No deformity.  Normal range of motion.     Cervical back: Neck supple.  Skin:    General: Skin is warm and dry.     Capillary Refill: Capillary refill takes less than 2 seconds.  Neurological:     General: No focal deficit present.     Mental Status: She is alert.     Sensory: No sensory deficit.     Motor: No weakness.  Psychiatric:        Mood and Affect: Mood normal.     ED Results / Procedures / Treatments   Labs (all labs ordered are listed, but only abnormal results are displayed) Labs Reviewed  CBG MONITORING, ED - Abnormal; Notable for the following components:      Result Value   Glucose-Capillary 136 (*)    All other components within normal limits  CULTURE, BLOOD (ROUTINE X 2)  CULTURE, BLOOD (ROUTINE X 2)  SARS CORONAVIRUS 2 BY RT PCR  LACTIC ACID, PLASMA  LACTIC ACID, PLASMA  COMPREHENSIVE METABOLIC PANEL  CBC WITH DIFFERENTIAL/PLATELET  PROTIME-INR  APTT  URINALYSIS, W/ REFLEX TO CULTURE (INFECTION SUSPECTED)  TROPONIN I (HIGH SENSITIVITY)    EKG EKG Interpretation Date/Time:  Wednesday November 19 2022 17:51:03 EDT Ventricular Rate:  89 PR Interval:  274 QRS Duration:  78 QT Interval:  356 QTC Calculation: 434 R Axis:   -17  Text Interpretation: Sinus rhythm Prolonged PR interval Borderline left axis deviation Probable anteroseptal infarct, old No significant change since prior 3/24 Confirmed by Meridee Score [phone]) on 11/19/2022 5:52:35 PM  Radiology No results found.  Procedures Procedures  {Document cardiac monitor, telemetry assessment procedure when appropriate:1}  Medications Ordered in ED Medications  lactated ringers infusion (has no administration in time range)    ED Course/ Medical Decision Making/ A&P Clinical Course as of 11/19/22 2201  Wed Nov 19, 2022  1844 Chest x-ray interpreted by me as no acute infiltrate.  Awaiting radiology reading. [MB]    Clinical Course User Index [MB] Terrilee Files, MD   {   Click here for ABCD2,  HEART and other calculatorsREFRESH Note before signing :1}                              Medical Decision Making Amount and/or Complexity of Data Reviewed Labs: ordered. Radiology: ordered.  Risk Prescription drug management.   This patient complains of ***; this involves an extensive number of treatment Options and is a complaint that carries with it a high risk of complications and morbidity. The differential includes ***  I ordered, reviewed and interpreted labs, which included *** I ordered medication *** and reviewed PMP when indicated.  I ordered imaging studies which included *** and I independently    visualized and interpreted imaging which showed *** Additional history obtained from *** Previous records obtained and reviewed *** I consulted *** and discussed lab and imaging findings and discussed disposition.  Cardiac monitoring reviewed, *** Social determinants considered, *** Critical Interventions: ***  After the interventions stated above, I reevaluated the patient and found *** Admission and further testing considered, ***   {Document critical care time when appropriate:1} {Document review of labs and clinical decision tools ie heart score, Chads2Vasc2 etc:1}  {Document your independent review of radiology images, and any outside records:1} {Document your discussion with family members, caretakers, and with consultants:1} {Document social determinants of health affecting pt's care:1} {Document your decision making why or why not admission, treatments were needed:1} Final Clinical Impression(s) / ED Diagnoses Final diagnoses:  None    Rx / DC Orders ED Discharge Orders     None

## 2022-11-19 NOTE — ED Notes (Signed)
Pt was hesitant to ambulate per EDP order due to weakness, EDP notified and stated they would further assess

## 2022-11-20 NOTE — Progress Notes (Signed)
Established patient visit   Patient: Christine Henderson   DOB: 1930/10/14   89 y.o. Female  MRN: [id number] Visit Date: 11/21/2022  Today's healthcare provider: Alfredia Ferguson, PA-C   Cc. Weakness, ED f/u  Subjective    HPI   Pt was seen in the ED 11/19/22 for generalized weakness 2/2 dementia. A full work up was done, nothing acute was appreciated and the pt was sent home after some IV fluids. Pt reports she fell asleep on the couch and was not able to get up. Her family found her on the floor, she was there likely for 2-4 hours unable to get up. She does have an aide that comes in to the home some days.  Today pt reports feeling fine, yesterday she was fatigued, slept most of the day. Family reports a fever last night of 101 F, given two ibuprofen. Denies weakness, chest pain, headache.   Medications: Outpatient Medications Prior to Visit  Medication Sig   Ascorbic Acid (VITAMIN C) 1000 MG tablet Take 1,000 mg by mouth daily.   Calcium Carbonate-Vitamin D (CALCIUM PLUS VITAMIN D PO) Take 1,200 tablets by mouth daily.   donepezil (ARICEPT) 10 MG tablet TAKE 1 TABLET BY MOUTH DAILY   fish oil-omega-3 fatty acids 1000 MG capsule Take 2 g by mouth daily.   Glucosamine-Chondroit-Vit C-Mn (GLUCOSAMINE 1500 COMPLEX) CAPS Take 1,500 capsules by mouth 2 (two) times daily.   losartan (COZAAR) 25 MG tablet TAKE 1/2 TO 1 TABLET BY MOUTH  DAILY   Multiple Vitamin (MULTIVITAMIN) tablet Take 1 tablet by mouth daily.   simvastatin (ZOCOR) 10 MG tablet TAKE 1 TABLET BY MOUTH AT  BEDTIME   furosemide (LASIX) 20 MG tablet Take 1 tablet (20 mg total) by mouth daily. (Patient not taking: Reported on 09/10/2022)   hydrocortisone (ANUSOL-HC) 25 MG suppository Place 1 suppository (25 mg total) rectally at bedtime. (Patient not taking: Reported on 09/10/2022)   hydrocortisone 2.5 % cream Apply topically 2 (two) times daily. (Patient not taking: Reported on 11/21/2022)   No facility-administered  medications prior to visit.    Review of Systems    Objective    BP 120/64 (BP Location: Left Arm, Patient Position: Sitting, Cuff Size: Small)   Pulse 97   Temp 98.2 F (36.8 C) (Oral)   Resp 18   Wt 122 lb (55.3 kg)   SpO2 96%   BMI 22.31 kg/m   Physical Exam Constitutional:      General: She is awake.     Appearance: She is well-developed.  HENT:     Head: Normocephalic.  Eyes:     Conjunctiva/sclera: Conjunctivae normal.  Cardiovascular:     Rate and Rhythm: Normal rate and regular rhythm.     Heart sounds: Normal heart sounds.  Pulmonary:     Effort: Pulmonary effort is normal.     Breath sounds: Normal breath sounds.  Musculoskeletal:     Comments: Gait is unstable today with cane  Skin:    General: Skin is warm.  Neurological:     Mental Status: She is alert and oriented to person, place, and time.  Psychiatric:        Attention and Perception: Attention normal.        Mood and Affect: Mood normal.        Speech: Speech normal.        Behavior: Behavior is cooperative.      No results found for any visits on 11/21/22.  Assessment & Plan     1. Weakness Reviewed extensive ED w/u  Likely age related.  Discussed w/ family current living situation and safety  2. Chronic low back pain, unspecified back pain laterality, unspecified whether sciatica present 3. Fever, unspecified fever cause Afebrile at visit, vitals normal  Pt unable to urinate today. If CBC elevated will treat for UTI.  Given sample cup to return for culture  Recommending tylenol prn at night instead of ibuprofen for chronic back pain  - Urine Culture - CBC w/Diff  4. Hyponatremia Seen in ED, minor, will repeat cmp   - Comp Met (CMET)   Return if symptoms worsen or fail to improve.      I, Alfredia Ferguson, PA-C have reviewed all documentation for this visit. The documentation on  11/21/22   for the exam, diagnosis, procedures, and orders are all accurate and complete.     Alfredia Ferguson, PA-C  [facility] [phone] (phone) [phone] (fax)  [facility]

## 2022-11-21 ENCOUNTER — Other Ambulatory Visit: Payer: Self-pay | Admitting: Physician Assistant

## 2022-11-21 ENCOUNTER — Ambulatory Visit (INDEPENDENT_AMBULATORY_CARE_PROVIDER_SITE_OTHER): Payer: Medicare Other | Admitting: Physician Assistant

## 2022-11-21 ENCOUNTER — Telehealth: Payer: Self-pay

## 2022-11-21 ENCOUNTER — Encounter: Payer: Self-pay | Admitting: Physician Assistant

## 2022-11-21 VITALS — BP 120/64 | HR 97 | Temp 98.2°F | Resp 18 | Wt 122.0 lb

## 2022-11-21 DIAGNOSIS — E871 Hypo-osmolality and hyponatremia: Secondary | ICD-10-CM

## 2022-11-21 DIAGNOSIS — M545 Low back pain, unspecified: Secondary | ICD-10-CM | POA: Diagnosis not present

## 2022-11-21 DIAGNOSIS — G8929 Other chronic pain: Secondary | ICD-10-CM

## 2022-11-21 DIAGNOSIS — R531 Weakness: Secondary | ICD-10-CM

## 2022-11-21 DIAGNOSIS — R509 Fever, unspecified: Secondary | ICD-10-CM | POA: Diagnosis not present

## 2022-11-21 LAB — CBC WITH DIFFERENTIAL/PLATELET
Basophils Absolute: 0.1 10*3/uL (ref 0.0–0.1)
Basophils Relative: 0.6 % (ref 0.0–3.0)
Eosinophils Absolute: 0 10*3/uL (ref 0.0–0.7)
Eosinophils Relative: 0 % (ref 0.0–5.0)
HCT: 40.2 % (ref 36.0–46.0)
Hemoglobin: 13.2 g/dL (ref 12.0–15.0)
Lymphocytes Relative: 7.8 % — ABNORMAL LOW (ref 12.0–46.0)
Lymphs Abs: 0.6 10*3/uL — ABNORMAL LOW (ref 0.7–4.0)
MCHC: 32.7 g/dL (ref 30.0–36.0)
MCV: 94.2 fl (ref 78.0–100.0)
Monocytes Absolute: 0.4 10*3/uL (ref 0.1–1.0)
Monocytes Relative: 5.2 % (ref 3.0–12.0)
Neutro Abs: 7 10*3/uL (ref 1.4–7.7)
Neutrophils Relative %: 86.4 % — ABNORMAL HIGH (ref 43.0–77.0)
Platelets: 217 10*3/uL (ref 150.0–400.0)
RBC: 4.27 Mil/uL (ref 3.87–5.11)
RDW: 15.2 % (ref 11.5–15.5)
WBC: 8.1 10*3/uL (ref 4.0–10.5)

## 2022-11-21 LAB — COMPREHENSIVE METABOLIC PANEL
ALT: 38 U/L — ABNORMAL HIGH (ref 0–35)
AST: 61 U/L — ABNORMAL HIGH (ref 0–37)
Albumin: 3.9 g/dL (ref 3.5–5.2)
Alkaline Phosphatase: 58 U/L (ref 39–117)
BUN: 30 mg/dL — ABNORMAL HIGH (ref 6–23)
CO2: 22 mEq/L (ref 19–32)
Calcium: 9 mg/dL (ref 8.4–10.5)
Chloride: 99 mEq/L (ref 96–112)
Creatinine, Ser: 1.24 mg/dL — ABNORMAL HIGH (ref 0.40–1.20)
GFR: 38.27 mL/min — ABNORMAL LOW (ref >60.00)
Glucose, Bld: 241 mg/dL — ABNORMAL HIGH (ref 70–99)
Potassium: 3.8 mEq/L (ref 3.5–5.1)
Sodium: 133 mEq/L — ABNORMAL LOW (ref 135–145)
Total Bilirubin: 0.6 mg/dL (ref 0.2–1.2)
Total Protein: 7.2 g/dL (ref 6.0–8.3)

## 2022-11-21 NOTE — Telephone Encounter (Signed)
Spoke with patient's son Viviann Spare and clarified phone message on result notes

## 2022-11-24 LAB — CULTURE, BLOOD (ROUTINE X 2)
Culture: NO GROWTH
Culture: NO GROWTH
Special Requests: ADEQUATE
Special Requests: ADEQUATE

## 2022-11-26 ENCOUNTER — Other Ambulatory Visit (INDEPENDENT_AMBULATORY_CARE_PROVIDER_SITE_OTHER): Payer: Medicare Other

## 2022-11-26 DIAGNOSIS — R531 Weakness: Secondary | ICD-10-CM | POA: Diagnosis not present

## 2022-11-26 DIAGNOSIS — E871 Hypo-osmolality and hyponatremia: Secondary | ICD-10-CM

## 2022-11-26 DIAGNOSIS — G8929 Other chronic pain: Secondary | ICD-10-CM | POA: Diagnosis not present

## 2022-11-26 DIAGNOSIS — R509 Fever, unspecified: Secondary | ICD-10-CM

## 2022-11-26 DIAGNOSIS — M545 Low back pain, unspecified: Secondary | ICD-10-CM

## 2022-11-27 ENCOUNTER — Other Ambulatory Visit: Payer: Self-pay | Admitting: Physician Assistant

## 2022-11-27 DIAGNOSIS — E871 Hypo-osmolality and hyponatremia: Secondary | ICD-10-CM

## 2022-11-27 DIAGNOSIS — R748 Abnormal levels of other serum enzymes: Secondary | ICD-10-CM

## 2022-11-27 DIAGNOSIS — R531 Weakness: Secondary | ICD-10-CM

## 2022-11-27 LAB — CBC WITH DIFFERENTIAL/PLATELET
Basophils Absolute: 0 10*3/uL (ref 0.0–0.1)
Basophils Relative: 0.4 % (ref 0.0–3.0)
Eosinophils Absolute: 0.2 10*3/uL (ref 0.0–0.7)
Eosinophils Relative: 2.3 % (ref 0.0–5.0)
HCT: 37 % (ref 36.0–46.0)
Hemoglobin: 12.2 g/dL (ref 12.0–15.0)
Lymphocytes Relative: 20.5 % (ref 12.0–46.0)
Lymphs Abs: 1.8 10*3/uL (ref 0.7–4.0)
MCHC: 33 g/dL (ref 30.0–36.0)
MCV: 93.2 fl (ref 78.0–100.0)
Monocytes Absolute: 0.6 10*3/uL (ref 0.1–1.0)
Monocytes Relative: 7.3 % (ref 3.0–12.0)
Neutro Abs: 6.1 10*3/uL (ref 1.4–7.7)
Neutrophils Relative %: 69.5 % (ref 43.0–77.0)
Platelets: 281 10*3/uL (ref 150.0–400.0)
RBC: 3.97 Mil/uL (ref 3.87–5.11)
RDW: 15.3 % (ref 11.5–15.5)
WBC: 8.8 10*3/uL (ref 4.0–10.5)

## 2022-11-27 LAB — COMPREHENSIVE METABOLIC PANEL
ALT: 51 U/L — ABNORMAL HIGH (ref 0–35)
AST: 53 U/L — ABNORMAL HIGH (ref 0–37)
Albumin: 3.5 g/dL (ref 3.5–5.2)
Alkaline Phosphatase: 64 U/L (ref 39–117)
BUN: 24 mg/dL — ABNORMAL HIGH (ref 6–23)
CO2: 27 mEq/L (ref 19–32)
Calcium: 8.4 mg/dL (ref 8.4–10.5)
Chloride: 100 mEq/L (ref 96–112)
Creatinine, Ser: 0.85 mg/dL (ref 0.40–1.20)
GFR: 60.2 mL/min (ref >60.00)
Glucose, Bld: 112 mg/dL — ABNORMAL HIGH (ref 70–99)
Potassium: 4.4 mEq/L (ref 3.5–5.1)
Sodium: 135 mEq/L (ref 135–145)
Total Bilirubin: 0.6 mg/dL (ref 0.2–1.2)
Total Protein: 6.4 g/dL (ref 6.0–8.3)

## 2022-11-27 LAB — URINE CULTURE
MICRO NUMBER:: 15514723
SPECIMEN QUALITY:: ADEQUATE

## 2022-11-28 ENCOUNTER — Encounter: Payer: Self-pay | Admitting: Physician Assistant

## 2022-11-28 ENCOUNTER — Ambulatory Visit: Payer: Medicare Other | Admitting: Physician Assistant

## 2022-11-28 VITALS — BP 128/81 | HR 94 | Resp 20 | Wt 129.0 lb

## 2022-11-28 DIAGNOSIS — R413 Other amnesia: Secondary | ICD-10-CM

## 2022-11-28 NOTE — Progress Notes (Signed)
Assessment/Plan:   Dementia likely due to Alzheimer disease, late onset  Christine Henderson  a 89 y.o. RH female with a history of dementia likely due to early stages of Alzheimer's disease, late onset seen today in follow up.  She was last seen in our office on 05/28/2022 with MMSE 28/30.  There is mild cognitive decline from her prior visit, however, she is still able to participate on her ADLs to her ability.  She no longer drives.  She is on donepezil 10 mg daily.  Mood is good.   Follow up in   months. Continue donepezil 10 mg daily Therapy for strength and balance Recommend good control of her cardiovascular risk factors Continue to control mood as per PCP    Subjective:    This patient is accompanied in the office by her son who supplements the history.  Previous records as well as any outside records available were reviewed prior to todays visit. Patient was last seen on March 2024 with MMSE 28/30.   Any changes in memory since last visit? "Worse than prior"-son says.  She needs some time to recall words, "more than before ".  She now forgets the names of things such as vacuum cleaner, and names. She enjoys reading the newspaper, she cannot do any puzzles or word finding.  She has difficulty making decisions.    repeats oneself?  Endorsed, "not too  often" Disoriented when walking into a room?  Patient denies    Leaving objects in unusual places?  May misplace things but not in unusual places   Wandering behavior?  Denies.   Any personality changes since last visit?  Denies.   Any worsening depression?:  Denies.   Hallucinations or paranoia?  Denies.   Seizures? denies    Any sleep changes?  Sleeps well, goes to sleep earlier at 8:30 . Denies frequent vivid dreams, REM behavior or sleepwalking   Sleep apnea?   Denies.   Any hygiene concerns? Son reports that she needs help to shower.  Independent of bathing and dressing?  Needs assistance.  Does the patient needs help with  medications?  Patient is in charge   Who is in charge of the finances? Son in charge     Any changes in appetite?  Denies.     Patient have trouble swallowing? Denies   Does the patient cook? No Any headaches?   Denies.   Chronic back pain  denies.   Ambulates with difficulty?  She had a recent presentation to the ER on 11/19/2022 with generalized weakness, and a mechanical fall as she slipped down trying to get off the couch and then she was not able to get off the floor.  No LOC.  No head injury.  She was seen at the ER, radiology studies negative. She uses a right cane for stability. Unilateral weakness, numbness or tingling? Denies.   Any tremors?  Denies   Any anosmia?  Denies   Any incontinence of urine?  Endorsed, wears diapers Any bowel dysfunction?  She has chronic issues with constipation, and occasional bowel incontinence, followed by GI      Patient lives alone with a caregiver 3 times a week, getting ready to have a full time caregiver for 24/7   Does the patient drive?  Quit driving.     Neuropsychological evaluation 09/17/2022. Briefly, results suggested fairly diffuse cognitive dysfunction. A relative strength was exhibited across basic attention, while performance variability was exhibited across visuospatial  abilities and recognition/consolidation aspects of memory. Consistent impairment relative to age-matched peers was exhibited across processing speed, cognitive flexibility, verbal fluency, confrontation naming, and both encoding (i.e., learning) and delayed retrieval aspects of memory. The etiology of her mild dementia presentation is somewhat unclear. I do have concerns surrounding the presence of a neurodegenerative illness. Of these illnesses, Alzheimer's disease would appear the most likely. Across memory tasks, Christine Henderson had difficulties learning information despite repeated exposure and was fully amnestic (i.e., 0% retention) across 2/3 tasks. She further demonstrated severe  impairment surrounding semantic fluency and confrontation naming, which would follow typical disease progression. Neuroimaging also revealed concerns for cerebral amyloid angiopathy, which is an established risk factor for this illness. With that being said, Christine Henderson did exhibit some retention of verbal information, observed chiefly across adequate yes/no recognition trial performances. This would not suggest a complete storage impairment, which is certainly encouraging. If Alzheimer's disease is indeed present, it would appear to still be in somewhat earlier stages based upon memory testing.      Initial Visit 11/01/21 How long did patient have memory difficulties?  Over the last 6 months she had difficulty with names of people.  However, prior to that, he had noticed that the patient had shown some signs of to 4 years ago.  "Last night she went to eat and could not make a decision ".  She enjoys reading the newspaper daily, but cannot do any crossword puzzles or word finding.  Short-term memory is worse than long-term memory.  Patient lives with:  Patient lives alone repeats oneself?  Occasionally "not too often" Disoriented when walking into a room?  Patient denies   Leaving objects in unusual places?  Patient denies   Ambulates  with difficulty?   Patient denies, although "she has more balance issues than prior "-her son says Recent falls?  About 2 months ago, she had a mechanical fall in the bathtub, without loss of consciousness.  She did not seek medical attention.  Any head injuries?  Patient denies   History of seizures?   Patient denies   Wandering behavior?  Patient denies   Patient drives? Short distances only.  About 2 years ago, the patient had one episode in which he got lost, without recurrence.    Any mood changes?  Patient denies   Any history of depression?:  Patient denies   Hallucinations?  Patient denies   Paranoia?  Patient denies   Patient reports that sleeps well without  vivid dreams, REM behavior or sleepwalking . Takes melatonin prn  History of sleep apnea?  Patient denies   Any hygiene concerns?  Patient denies   Independent of bathing and dressing?  Endorsed  Does the patient needs help with medications?  Patient denies   Who is in charge of the finances?  Patient is in charge  with son's supervision  Any changes in appetite?  Does not eat as much "food does not taste as good"  Patient have trouble swallowing? Patient denies   Does the patient cook?  Patient denies   Any kitchen accidents such as leaving the stove on? Patient denies   Any headaches?  Patient denies   Double vision? Patient denies   Any focal numbness or tingling?  Patient denies   Chronic back pain Patient denies   Unilateral weakness?  Patient denies   Any tremors?  Patient denies   Any history of anosmia?  Patient denies   Any incontinence of urine?  Patient denies  Any bowel dysfunction?   Patient denies   History of heavy alcohol intake?  Patient denies   History of heavy tobacco use?  Patient denies   Family history of dementia?     Mother, Father, and Sister  had  Alzheimer's disease     MRI brain 11/17/21 personally reviewed  was remarkable for moderate chronic small vessel ischemic changes within the cerebral white matter. 4. Fairly numerous chronic microhemorrhages within the supratentorial brain, greatest within the bilateral occipital lobes suspicious for sequelae of cerebral amyloid angiopathy, chronic blood products from remote subarachnoid hemorrhage, moderate generalized cerebral atrophy.   Pertinent labs : 10/29/2021: TSH 5.25, A1c 6.1, total cholesterol 218, LDL 125  PREVIOUS MEDICATIONS:   CURRENT MEDICATIONS:  Outpatient Encounter Medications as of 11/28/2022  Medication Sig   Ascorbic Acid (VITAMIN C) 1000 MG tablet Take 1,000 mg by mouth daily.   Calcium Carbonate-Vitamin D (CALCIUM PLUS VITAMIN D PO) Take 1,200 tablets by mouth daily.   donepezil (ARICEPT)  10 MG tablet TAKE 1 TABLET BY MOUTH DAILY   fish oil-omega-3 fatty acids 1000 MG capsule Take 2 g by mouth daily.   furosemide (LASIX) 20 MG tablet Take 1 tablet (20 mg total) by mouth daily.   Glucosamine-Chondroit-Vit C-Mn (GLUCOSAMINE 1500 COMPLEX) CAPS Take 1,500 capsules by mouth 2 (two) times daily.   hydrocortisone (ANUSOL-HC) 25 MG suppository Place 1 suppository (25 mg total) rectally at bedtime.   hydrocortisone 2.5 % cream Apply topically 2 (two) times daily.   losartan (COZAAR) 25 MG tablet TAKE 1/2 TO 1 TABLET BY MOUTH  DAILY   Multiple Vitamin (MULTIVITAMIN) tablet Take 1 tablet by mouth daily.   simvastatin (ZOCOR) 10 MG tablet TAKE 1 TABLET BY MOUTH AT  BEDTIME   cyanocobalamin (VITAMIN B12) 1000 MCG tablet Take by mouth.   No facility-administered encounter medications on file as of 11/28/2022.       05/28/2022   10:00 AM 11/01/2021   10:00 AM 01/13/2017   10:32 AM  MMSE - Mini Mental State Exam  Orientation to time 5 5 5   Orientation to Place 5 5 5   Registration 3 3 3   Attention/ Calculation 4 1 5   Recall 3 3 3   Language- name 2 objects 2 2 2   Language- repeat 1 0 1  Language- follow 3 step command 3 3 3   Language- read & follow direction 1 1 1   Write a sentence 1 0 1  Copy design 0 0 1  Total score 28 23 30        No data to display          Objective:     PHYSICAL EXAMINATION:    VITALS:   Vitals:   11/28/22 0929  BP: 128/81  Pulse: 94  Resp: 20  SpO2: 95%  Weight: 129 lb (58.5 kg)    GEN:  The patient appears stated age and is in NAD. HEENT:  Normocephalic, atraumatic.   Neurological examination:  General: NAD, well-groomed, appears stated age. Orientation: The patient is alert. Oriented to person, place and not to date Cranial nerves: There is good facial symmetry.The speech is fluent and clear. No aphasia or dysarthria. Fund of knowledge is appropriate. Recent and remote memory are impaired. Attention and concentration are reduced.  Able  to name objects and repeat phrases.  Hearing is intact to conversational tone.  Sensation: Sensation is intact to light touch throughout Motor: Strength is at least antigravity x4. DTR's 2/4 in UE/LE     Movement  examination: Tone: There is normal tone in the UE/LE Abnormal movements:  no tremor.  No myoclonus.  No asterixis.   Coordination:  There is no decremation with RAM's. Normal finger to nose  Gait and Station: The patient has some arising out of a deep-seated chair without the use of the hands, needs a walker. The patient's stride length is short.  Gait is cautious and narrow.    Thank you for allowing [place] the opportunity to participate in the care of this nice patient. Please do not hesitate to contact us for any questions or concerns.   Total time spent on today's visit was 20 minutes dedicated to this patient today, preparing to see patient, examining the patient, ordering tests and/or medications and counseling the patient, documenting clinical information in the EHR or other health record, independently interpreting results and communicating results to the patient/family, discussing treatment and goals, answering patient's questions and coordinating care.  Cc:  Bradd Canary, MD  Marlowe Kays 11/28/2022 9:35 AM

## 2022-11-28 NOTE — Patient Instructions (Signed)
It was a pleasure to see you today at our office.   Recommendations:  Follow up in  6 months Continue donepezil 10 mg daily, side effects discussed  Agree with 24/7 care  PT for strength and balance    For guidance regarding [facility] and if placement were needed at the facility, contact Sidney Ace, Social Worker tel: [phone]  For assessment of decision of mental capacity and competency:  Call Dr. Erick Blinks, geriatric psychiatrist at [phone] Counseling regarding caregiver distress, including caregiver depression, anxiety and issues regarding community resources, adult day care programs, adult living facilities, or memory care questions:  please contact your  Primary Doctor's Social Worker  Whom to call: Memory  decline, memory medications: Call our office [phone]  For psychiatric meds, mood meds: Please have your primary care physician manage these medications.  If you have any severe symptoms of a stroke, or other severe issues such as confusion,severe chills or fever, etc call 911 or go to the ER as you may need to be evaluated further       RECOMMENDATIONS FOR ALL PATIENTS WITH MEMORY PROBLEMS: 1. Continue to exercise (Recommend 30 minutes of walking everyday, or 3 hours every week) 2. Increase social interactions - continue going to [place] and enjoy social gatherings with friends and family 3. Eat healthy, avoid fried foods and eat more fruits and vegetables 4. Maintain adequate blood pressure, blood sugar, and blood cholesterol level. Reducing the risk of stroke and cardiovascular disease also helps promoting better memory. 5. Avoid stressful situations. Live a simple life and avoid aggravations. Organize your time and prepare for the next day in anticipation. 6. Sleep well, avoid any interruptions of sleep and avoid any distractions in the bedroom that may interfere with adequate sleep quality 7. Avoid sugar, avoid sweets as there is a  strong link between excessive sugar intake, diabetes, and cognitive impairment We discussed the Mediterranean diet, which has been shown to help patients reduce the risk of progressive memory disorders and reduces cardiovascular risk. This includes eating fish, eat fruits and green leafy vegetables, nuts like almonds and hazelnuts, walnuts, and also use olive oil. Avoid fast foods and fried foods as much as possible. Avoid sweets and sugar as sugar use has been linked to worsening of memory function.  There is always a concern of gradual progression of memory problems. If this is the case, then we may need to adjust level of care according to patient needs. Support, both to the patient and caregiver, should then be put into place.    FALL PRECAUTIONS: Be cautious when walking. Scan the area for obstacles that may increase the risk of trips and falls. When getting up in the mornings, sit up at the edge of the bed for a few minutes before getting out of bed. Consider elevating the bed at the head end to avoid drop of blood pressure when getting up. Walk always in a well-lit room (use night lights in the walls). Avoid area rugs or power cords from appliances in the middle of the walkways. Use a walker or a cane if necessary and consider physical therapy for balance exercise. Get your eyesight checked regularly.  FINANCIAL OVERSIGHT: Supervision, especially oversight when making financial decisions or transactions is also recommended.  HOME SAFETY: Consider the safety of the kitchen when operating appliances like stoves, microwave oven, and blender. Consider having supervision and share cooking responsibilities until no longer able to participate in those. Accidents with firearms and  other hazards in the house should be identified and addressed as well.   ABILITY TO BE LEFT ALONE: If patient is unable to contact 911 operator, consider using [employer], or when the need is there, arrange for someone to stay  with patients. Smoking is a fire hazard, consider supervision or cessation. Risk of wandering should be assessed by caregiver and if detected at any point, supervision and safe proof recommendations should be instituted.  MEDICATION SUPERVISION: Inability to self-administer medication needs to be constantly addressed. Implement a mechanism to ensure safe administration of the medications.   DRIVING: Regarding driving, in patients with progressive memory problems, driving will be impaired. We advise to have someone else do the driving if trouble finding directions or if minor accidents are reported. Independent driving assessment is available to determine safety of driving.   If you are interested in the driving assessment, you can contact the following:  [employer] in [place] [phone]  [facility] [phone]  [facility] [phone]  [facility] [phone] or [phone]

## 2022-12-20 ENCOUNTER — Encounter: Payer: Self-pay | Admitting: Physician Assistant

## 2022-12-22 ENCOUNTER — Other Ambulatory Visit: Payer: Self-pay

## 2022-12-22 DIAGNOSIS — R531 Weakness: Secondary | ICD-10-CM

## 2022-12-22 DIAGNOSIS — R2681 Unsteadiness on feet: Secondary | ICD-10-CM

## 2023-01-12 ENCOUNTER — Ambulatory Visit: Payer: Medicare Other | Attending: Physician Assistant | Admitting: Physical Therapy

## 2023-01-12 ENCOUNTER — Encounter: Payer: Self-pay | Admitting: Physical Therapy

## 2023-01-12 ENCOUNTER — Other Ambulatory Visit: Payer: Self-pay

## 2023-01-12 DIAGNOSIS — R2681 Unsteadiness on feet: Secondary | ICD-10-CM | POA: Diagnosis not present

## 2023-01-12 DIAGNOSIS — R531 Weakness: Secondary | ICD-10-CM | POA: Diagnosis not present

## 2023-01-12 DIAGNOSIS — M6281 Muscle weakness (generalized): Secondary | ICD-10-CM | POA: Insufficient documentation

## 2023-01-12 DIAGNOSIS — R262 Difficulty in walking, not elsewhere classified: Secondary | ICD-10-CM | POA: Insufficient documentation

## 2023-01-12 DIAGNOSIS — R296 Repeated falls: Secondary | ICD-10-CM | POA: Diagnosis not present

## 2023-01-12 NOTE — Therapy (Signed)
OUTPATIENT PHYSICAL THERAPY LOWER EXTREMITY EVALUATION   Patient Name: DRAYA HAFT MRN: [id number] DOB:[birth year], 89 y.o., female Today's Date: 01/12/2023  END OF SESSION:  PT End of Session - 01/12/23 1107     Visit Number 1    Number of Visits 17    Date for PT Re-Evaluation 03/09/23    Authorization Type [employer] MCR    Authorization Time Period 01/12/23 to 03/09/23    Progress Note Due on Visit 10    PT Start Time 1102    PT Stop Time 1143    PT Time Calculation (min) 41 min    Activity Tolerance Patient tolerated treatment well    Behavior During Therapy [facility] for tasks assessed/performed             Past Medical History:  Diagnosis Date   Abdominal pain 06/26/2008   Back pain 06/27/2013   Neck and low back   Bilateral impacted cerumen 10/21/2020   Disequilibrium 10/25/2021   Edema 08/14/2022   Essential hypertension 10/06/2007   Fibrocystic breast disease    Heart murmur 10/25/2021   Hereditary and idiopathic peripheral neuropathy 01/10/2016   Hyperglycemia 07/09/2014   Hyperlipidemia 10/01/2006   Insomnia 11/14/2012   Irritable bowel syndrome 09/10/2006   Lumbar back pain with radiculopathy affecting left lower extremity 11/13/2013   Metatarsal stress fracture 01/20/2016   Migraine headache 09/10/2006   Mild dementia 09/10/2022   MVP (mitral valve prolapse)    Onychomycosis 07/09/2014   Osteopenia 11/12/2012   Palpitations 09/10/2006   Paresthesia of foot, bilateral 07/09/2014   Postmenopausal atrophic vaginitis 10/01/2006   Recurrent falls 01/30/2022   Renal insufficiency 08/14/2022   Vasomotor rhinitis 02/02/2008   Vitamin D deficiency 07/14/2016   Past Surgical History:  Procedure Laterality Date   DENTAL SURGERY  09/2014   ROTATOR CUFF REPAIR Right    TONSILLECTOMY     Patient Active Problem List   Diagnosis Date Noted   Mild dementia 09/10/2022   Edema 08/14/2022   Renal insufficiency 08/14/2022   Recurrent falls 01/30/2022    Disequilibrium 10/25/2021   Heart murmur 10/25/2021   Vitamin D deficiency 07/14/2016   Hereditary and idiopathic peripheral neuropathy 01/10/2016   Paresthesia of foot, bilateral 07/09/2014   Hyperglycemia 07/09/2014   Onychomycosis 07/09/2014   Lumbar back pain with radiculopathy affecting left lower extremity 11/13/2013   Back pain 06/27/2013   Insomnia 11/14/2012   Osteopenia 11/12/2012   Vasomotor rhinitis 02/02/2008   Essential hypertension 10/06/2007   Hyperlipidemia 10/01/2006   Postmenopausal atrophic vaginitis 10/01/2006   Irritable bowel syndrome 09/10/2006   Disorder of bone and cartilage 09/10/2006    PCP: Danise Edge MD   REFERRING PROVIDER: Marcos Eke, PA-C  REFERRING DIAG:  Diagnosis  R26.81 (ICD-10-CM) - Gait instability  R53.1 (ICD-10-CM) - Weakness    THERAPY DIAG:  Muscle weakness (generalized)  Unsteadiness on feet  Difficulty in walking, not elsewhere classified  Repeated falls  Rationale for Evaluation and Treatment: Rehabilitation  ONSET DATE: chronic   SUBJECTIVE:   SUBJECTIVE STATEMENT:  They sent out a lady to help me with my balance, I'm better now. They still want me to do these exercises they suggested that I do. Nothing specifically is hard for me to do. No falls real recently, did have a fall when I was getting ready 2-3 months ago, was reaching up on a shelf and lost my balance and fell in the closet.   PERTINENT HISTORY: Back pain, disequilibrium, HTN, peripheral neuropathy, HLD, insomnia,  low back pain with radiculopathy, IBS, metatarsal stress fx, dementia, osteopenia, recurrent falls, rotator cuff repair  PAIN:  Are you having pain? No  PRECAUTIONS: Fall  RED FLAGS: None   WEIGHT BEARING RESTRICTIONS: No  FALLS:  Has patient fallen in last 6 months? Yes. Number of falls at least 2 falls in the past 6 months- one in closet reaching, one when legs buckled trying to get up off of couch   LIVING ENVIRONMENT: Lives  with: lives alone and has home aide 3 days a week, family checks on her  Lives in: House/apartment Stairs: No Has following equipment at home: Environmental consultant - 2 wheeled, bed side commode, and Grab bars  OCCUPATION: retired   PLOF: Independent with household mobility with device, Requires assistive device for independence, Needs assistance with ADLs, and Needs assistance with homemaking  PATIENT GOALS: not sure   NEXT MD VISIT: Referring in January 2025  OBJECTIVE:  Note: Objective measures were completed at Evaluation unless otherwise noted.    PATIENT SURVEYS:  ABC scale 600/1600 37.5%  COGNITION: Overall cognitive status: History of cognitive impairments - at baseline     SENSATION: Not tested      LOWER EXTREMITY MMT:  MMT Right eval Left eval  Hip flexion 4+ 4+  Hip extension    Hip abduction 4 4  Hip adduction    Hip internal rotation    Hip external rotation    Knee flexion    Knee extension 5 5  Ankle dorsiflexion 5 5  Ankle plantarflexion    Ankle inversion    Ankle eversion     (Blank rows = not tested)    FUNCTIONAL TESTS:  5 times sit to stand: 24 seconds, use of BUEs on chair, very poor eccentric control and muscle endurance  Timed up and go (TUG): 26.4 seconds no device, MinA from PT  3 minute walk test: 333ft no device, MinA for balance due to scissoring gait and poor balance recovery skills     TODAY'S TREATMENT:                                                                                                                              DATE:   01/12/23- eval, care planning, education for son/caregiver on POC   Nustep L3 x8 minutes for BLE strength     PATIENT EDUCATION:  Education details: exam findings, POC Person educated: Patient Education method: Medical illustrator Education comprehension: verbalized understanding and needs further education  HOME EXERCISE PROGRAM: TBD   ASSESSMENT:  CLINICAL IMPRESSION: Patient is  a 89 y.o. F who was seen today for physical therapy evaluation and treatment for  Diagnosis  R26.81 (ICD-10-CM) - Gait instability  R53.1 (ICD-10-CM) - Weakness  . Exam findings as above- does demonstrate critical weakness and balance impairment making her a fairly high fall risk at this time. Will really benefit from skilled PT services to address all objective findings/reduce fall risk moving  forward.   OBJECTIVE IMPAIRMENTS: Abnormal gait, decreased activity tolerance, decreased balance, decreased knowledge of use of DME, decreased mobility, difficulty walking, decreased strength, and decreased safety awareness.   ACTIVITY LIMITATIONS: standing, stairs, transfers, bed mobility, and locomotion level  PARTICIPATION LIMITATIONS: cleaning, laundry, shopping, community activity, and church  PERSONAL FACTORS: Age, Behavior pattern, Education, Fitness, Past/current experiences, and Time since onset of injury/illness/exacerbation are also affecting patient's functional outcome.   REHAB POTENTIAL: Good  CLINICAL DECISION MAKING: Stable/uncomplicated  EVALUATION COMPLEXITY: Low   GOALS: Goals reviewed with patient? Yes  SHORT TERM GOALS: Target date: 02/09/2023   Will be compliant with appropriate HEP with MinA from family and home aide  Baseline: Goal status: INITIAL  2.  Will demonstrate consistent safe use of RW during all functional gait based tasks to reduce fall risk  Baseline:  Goal status: INITIAL  3.  Family to be able to name 5/5 ways to reduce fall risk/ how to set up home to reduce fall risk  Baseline:  Goal status: INITIAL  4.  Will demonstrate consistent good technique with RW with all functional transfers  Baseline:  Goal status: INITIAL    LONG TERM GOALS: Target date: 03/09/2023    MMT to be 5/5 globally  Baseline:  Goal status: INITIAL  2.  Will ambulate at least 448ft in with LRAD and no more than S to show reduced fall risk/improved community access   Baseline:  Goal status: INITIAL  3.  Will complete TUG test in 15 seconds or less with LRAD and no more than S to show reduced fall risk  Baseline:  Goal status: INITIAL  4.  Will be able to complete 5xSTS test in 15 seconds or less to show improved functional strength/mobility  Baseline:  Goal status: INITIAL  5.  ABC score to improve by at least 20% to show subjective improvement with skilled PT services  Baseline:  Goal status: INITIAL     PLAN:  PT FREQUENCY: 1-2x/week  PT DURATION: 8 weeks  PLANNED INTERVENTIONS: 97164- PT Re-evaluation, 97110-Therapeutic exercises, 97530- Therapeutic activity, 97112- Neuromuscular re-education, 97535- Self Care, 16109- Manual therapy, [phone]- Gait training, Patient/Family education, Balance training, Stair training, and DME instructions  PLAN FOR NEXT SESSION: strength and balance focus, HEP assignment as appropriate given STM deficits   Nedra Hai, PT, DPT 01/12/23 11:55 AM    Date of referral: 12/22/22 Referring provider: Varney Biles PA-C  Referring diagnosis?  Diagnosis  R26.81 (ICD-10-CM) - Gait instability  R53.1 (ICD-10-CM) - Weakness   Treatment diagnosis? (if different than referring diagnosis) M62.81, R26.81, R26.2, R29.6  What was this (referring dx) caused by? Ongoing Issue  Ashby Dawes of Condition: Chronic (continuous duration > 3 months)   Laterality: Both  Current Functional Measure Score: Other ABC 37.5% (600/1600)  Objective measurements identify impairments when they are compared to normal values, the uninvolved extremity, and prior level of function.  [x]  Yes  []  No  Objective assessment of functional ability: Moderate functional limitations   Briefly describe symptoms: weakness, unsteadiness   How did symptoms start: chronic- over time  Average pain intensity:  Last 24 hours: 0/10  Past week: 0/10  How often does the pt experience symptoms? Constantly  How much have the symptoms  interfered with usual daily activities? Moderately  How has condition changed since care began at this facility? NA - initial visit  In general, how is the patients overall health? Good   BACK PAIN (STarT Back Screening Tool) No

## 2023-01-20 ENCOUNTER — Ambulatory Visit: Payer: Medicare Other

## 2023-01-20 ENCOUNTER — Other Ambulatory Visit: Payer: Self-pay

## 2023-01-20 DIAGNOSIS — M6281 Muscle weakness (generalized): Secondary | ICD-10-CM | POA: Diagnosis not present

## 2023-01-20 DIAGNOSIS — R296 Repeated falls: Secondary | ICD-10-CM

## 2023-01-20 DIAGNOSIS — R2681 Unsteadiness on feet: Secondary | ICD-10-CM

## 2023-01-20 DIAGNOSIS — R531 Weakness: Secondary | ICD-10-CM | POA: Diagnosis not present

## 2023-01-20 DIAGNOSIS — R262 Difficulty in walking, not elsewhere classified: Secondary | ICD-10-CM

## 2023-01-20 NOTE — Therapy (Signed)
OUTPATIENT PHYSICAL THERAPY LOWER EXTREMITY TREATMENT   Patient Name: Christine Henderson MRN: [id number] DOB:1930/04/24, 89 y.o., female Today's Date: 01/20/2023  END OF SESSION:  PT End of Session - 01/20/23 1400     Visit Number 2    Number of Visits 17    Date for PT Re-Evaluation 03/09/23    Authorization Type [employer] MCR    Authorization Time Period 01/12/23 to 03/09/23    Progress Note Due on Visit 10    PT Start Time 1400    PT Stop Time 1443    PT Time Calculation (min) 43 min    Activity Tolerance Patient tolerated treatment well    Behavior During Therapy [facility] for tasks assessed/performed              Past Medical History:  Diagnosis Date   Abdominal pain 06/26/2008   Back pain 06/27/2013   Neck and low back   Bilateral impacted cerumen 10/21/2020   Disequilibrium 10/25/2021   Edema 08/14/2022   Essential hypertension 10/06/2007   Fibrocystic breast disease    Heart murmur 10/25/2021   Hereditary and idiopathic peripheral neuropathy 01/10/2016   Hyperglycemia 07/09/2014   Hyperlipidemia 10/01/2006   Insomnia 11/14/2012   Irritable bowel syndrome 09/10/2006   Lumbar back pain with radiculopathy affecting left lower extremity 11/13/2013   Metatarsal stress fracture 01/20/2016   Migraine headache 09/10/2006   Mild dementia 09/10/2022   MVP (mitral valve prolapse)    Onychomycosis 07/09/2014   Osteopenia 11/12/2012   Palpitations 09/10/2006   Paresthesia of foot, bilateral 07/09/2014   Postmenopausal atrophic vaginitis 10/01/2006   Recurrent falls 01/30/2022   Renal insufficiency 08/14/2022   Vasomotor rhinitis 02/02/2008   Vitamin D deficiency 07/14/2016   Past Surgical History:  Procedure Laterality Date   DENTAL SURGERY  09/2014   ROTATOR CUFF REPAIR Right    TONSILLECTOMY     Patient Active Problem List   Diagnosis Date Noted   Mild dementia 09/10/2022   Edema 08/14/2022   Renal insufficiency 08/14/2022   Recurrent falls 01/30/2022    Disequilibrium 10/25/2021   Heart murmur 10/25/2021   Vitamin D deficiency 07/14/2016   Hereditary and idiopathic peripheral neuropathy 01/10/2016   Paresthesia of foot, bilateral 07/09/2014   Hyperglycemia 07/09/2014   Onychomycosis 07/09/2014   Lumbar back pain with radiculopathy affecting left lower extremity 11/13/2013   Back pain 06/27/2013   Insomnia 11/14/2012   Osteopenia 11/12/2012   Vasomotor rhinitis 02/02/2008   Essential hypertension 10/06/2007   Hyperlipidemia 10/01/2006   Postmenopausal atrophic vaginitis 10/01/2006   Irritable bowel syndrome 09/10/2006   Disorder of bone and cartilage 09/10/2006    PCP: Danise Edge MD   REFERRING PROVIDER: Marcos Eke, PA-C  REFERRING DIAG:  Diagnosis  R26.81 (ICD-10-CM) - Gait instability  R53.1 (ICD-10-CM) - Weakness    THERAPY DIAG:  Muscle weakness (generalized)  Unsteadiness on feet  Difficulty in walking, not elsewhere classified  Repeated falls  Rationale for Evaluation and Treatment: Rehabilitation  ONSET DATE: chronic   SUBJECTIVE:   SUBJECTIVE STATEMENT:  They sent out a lady to help me with my balance, I'm better now. They still want me to do these exercises they suggested that I do. Nothing specifically is hard for me to do. No falls real recently, did have a fall when I was getting ready 2-3 months ago, was reaching up on a shelf and lost my balance and fell in the closet.   PERTINENT HISTORY: Back pain, disequilibrium, HTN, peripheral neuropathy, HLD,  insomnia, low back pain with radiculopathy, IBS, metatarsal stress fx, dementia, osteopenia, recurrent falls, rotator cuff repair  PAIN:  Are you having pain? No  PRECAUTIONS: Fall  RED FLAGS: None   WEIGHT BEARING RESTRICTIONS: No  FALLS:  Has patient fallen in last 6 months? Yes. Number of falls at least 2 falls in the past 6 months- one in closet reaching, one when legs buckled trying to get up off of couch   LIVING ENVIRONMENT: Lives  with: lives alone and has home aide 3 days a week, family checks on her  Lives in: House/apartment Stairs: No Has following equipment at home: Environmental consultant - 2 wheeled, bed side commode, and Grab bars  OCCUPATION: retired   PLOF: Independent with household mobility with device, Requires assistive device for independence, Needs assistance with ADLs, and Needs assistance with homemaking  PATIENT GOALS: not sure   NEXT MD VISIT: Referring in January 2025  OBJECTIVE:  Note: Objective measures were completed at Evaluation unless otherwise noted.    PATIENT SURVEYS:  ABC scale 600/1600 37.5%  COGNITION: Overall cognitive status: History of cognitive impairments - at baseline     SENSATION: Not tested      LOWER EXTREMITY MMT:  MMT Right eval Left eval  Hip flexion 4+ 4+  Hip extension    Hip abduction 4 4  Hip adduction    Hip internal rotation    Hip external rotation    Knee flexion    Knee extension 5 5  Ankle dorsiflexion 5 5  Ankle plantarflexion    Ankle inversion    Ankle eversion     (Blank rows = not tested)    FUNCTIONAL TESTS:  5 times sit to stand: 24 seconds, use of BUEs on chair, very poor eccentric control and muscle endurance  Timed up and go (TUG): 26.4 seconds no device, MinA from PT  3 minute walk test: 357ft no device, MinA for balance due to scissoring gait and poor balance recovery skills     TODAY'S TREATMENT:                                                                                                                              DATE:  01/20/23:  Nustep level 3 x 3 min UE's and LE's Seated LAQ's 3# 10x 2 Seated hip abd/ER with green t band 15x 2 Standing heel raises at counter 15x Standing alt hip abd at counter 15x Standing alt hip ext with red t band around thighs 15x Standing side stepping length of counter with red t band around thighs, 2 x Standing one hand on counter for alt  high marches, 15x Standing at kitchen sink for B  heel/toe rocks 15x Standing in corner for [employer]  Instructed several times in brake management of new rollator which she brought in with her, required repeated instruction , poor recall noted     01/12/23- eval, care planning, education for son/caregiver on  POC   Nustep L3 x8 minutes for BLE strength     PATIENT EDUCATION:  Education details: exam findings, POC Person educated: Patient Education method: Medical illustrator Education comprehension: verbalized understanding and needs further education  HOME EXERCISE PROGRAM: TBD   ASSESSMENT:  CLINICAL IMPRESSION: The patient attended her second skilled physical therapy session to address her fall risk. She did obtain a new rollator,and during today's session was unable to remember how to lock the rollator in spite of repeated reminders.  She  tolerated all upright activities well, did not fatigue much.  Noted loss of balance posteriorly with hip flexion and with post heel rocks, need to address posterior righting reactions  OBJECTIVE IMPAIRMENTS: Abnormal gait, decreased activity tolerance, decreased balance, decreased knowledge of use of DME, decreased mobility, difficulty walking, decreased strength, and decreased safety awareness.   ACTIVITY LIMITATIONS: standing, stairs, transfers, bed mobility, and locomotion level  PARTICIPATION LIMITATIONS: cleaning, laundry, shopping, community activity, and church  PERSONAL FACTORS: Age, Behavior pattern, Education, Fitness, Past/current experiences, and Time since onset of injury/illness/exacerbation are also affecting patient's functional outcome.   REHAB POTENTIAL: Good  CLINICAL DECISION MAKING: Stable/uncomplicated  EVALUATION COMPLEXITY: Low   GOALS: Goals reviewed with patient? Yes  SHORT TERM GOALS: Target date: 02/09/2023   Will be compliant with appropriate HEP with MinA from family and home aide  Baseline: Goal status: INITIAL  2.  Will  demonstrate consistent safe use of RW during all functional gait based tasks to reduce fall risk  Baseline:  Goal status: INITIAL  3.  Family to be able to name 5/5 ways to reduce fall risk/ how to set up home to reduce fall risk  Baseline:  Goal status: INITIAL  4.  Will demonstrate consistent good technique with RW with all functional transfers  Baseline:  Goal status: INITIAL    LONG TERM GOALS: Target date: 03/09/2023    MMT to be 5/5 globally  Baseline:  Goal status: INITIAL  2.  Will ambulate at least 471ft in with LRAD and no more than S to show reduced fall risk/improved community access  Baseline:  Goal status: INITIAL  3.  Will complete TUG test in 15 seconds or less with LRAD and no more than S to show reduced fall risk  Baseline:  Goal status: INITIAL  4.  Will be able to complete 5xSTS test in 15 seconds or less to show improved functional strength/mobility  Baseline:  Goal status: INITIAL  5.  ABC score to improve by at least 20% to show subjective improvement with skilled PT services  Baseline:  Goal status: INITIAL     PLAN:  PT FREQUENCY: 1-2x/week  PT DURATION: 8 weeks  PLANNED INTERVENTIONS: 97164- PT Re-evaluation, 97110-Therapeutic exercises, 97530- Therapeutic activity, 97112- Neuromuscular re-education, 97535- Self Care, 78469- Manual therapy, [phone]- Gait training, Patient/Family education, Balance training, Stair training, and DME instructions  PLAN FOR NEXT SESSION: strength and balance focus, HEP assignment as appropriate given STM deficits   Jaryn Hocutt, PT, DPT, OCS 01/20/23 6:30 PM    Date of referral: 12/22/22 Referring provider: Varney Biles PA-C  Referring diagnosis?  Diagnosis  R26.81 (ICD-10-CM) - Gait instability  R53.1 (ICD-10-CM) - Weakness   Treatment diagnosis? (if different than referring diagnosis) M62.81, R26.81, R26.2, R29.6  What was this (referring dx) caused by? Ongoing Issue  Ashby Dawes of Condition:  Chronic (continuous duration > 3 months)   Laterality: Both  Current Functional Measure Score: Other ABC 37.5% (600/1600)  Objective measurements identify impairments  when they are compared to normal values, the uninvolved extremity, and prior level of function.  [x]  Yes  []  No  Objective assessment of functional ability: Moderate functional limitations   Briefly describe symptoms: weakness, unsteadiness   How did symptoms start: chronic- over time  Average pain intensity:  Last 24 hours: 0/10  Past week: 0/10  How often does the pt experience symptoms? Constantly  How much have the symptoms interfered with usual daily activities? Moderately  How has condition changed since care began at this facility? NA - initial visit  In general, how is the patients overall health? Good   BACK PAIN (STarT Back Screening Tool) No

## 2023-01-27 ENCOUNTER — Ambulatory Visit: Payer: Medicare Other

## 2023-01-27 DIAGNOSIS — R296 Repeated falls: Secondary | ICD-10-CM | POA: Diagnosis not present

## 2023-01-27 DIAGNOSIS — M6281 Muscle weakness (generalized): Secondary | ICD-10-CM

## 2023-01-27 DIAGNOSIS — R2681 Unsteadiness on feet: Secondary | ICD-10-CM

## 2023-01-27 DIAGNOSIS — R531 Weakness: Secondary | ICD-10-CM | POA: Diagnosis not present

## 2023-01-27 DIAGNOSIS — R262 Difficulty in walking, not elsewhere classified: Secondary | ICD-10-CM

## 2023-01-27 NOTE — Therapy (Signed)
OUTPATIENT PHYSICAL THERAPY LOWER EXTREMITY TREATMENT   Patient Name: Christine Henderson MRN: [id number] DOB:March 05, 1930, 89 y.o., female Today's Date: 01/27/2023  END OF SESSION:  PT End of Session - 01/27/23 1020     Visit Number 3    Number of Visits 17    Date for PT Re-Evaluation 03/09/23    Authorization Type [employer] MCR    Authorization Time Period 01/12/23 to 03/09/23    Progress Note Due on Visit 10    PT Start Time 0934    PT Stop Time 1019    PT Time Calculation (min) 45 min    Activity Tolerance Patient tolerated treatment well    Behavior During Therapy [facility] for tasks assessed/performed               Past Medical History:  Diagnosis Date   Abdominal pain 06/26/2008   Back pain 06/27/2013   Neck and low back   Bilateral impacted cerumen 10/21/2020   Disequilibrium 10/25/2021   Edema 08/14/2022   Essential hypertension 10/06/2007   Fibrocystic breast disease    Heart murmur 10/25/2021   Hereditary and idiopathic peripheral neuropathy 01/10/2016   Hyperglycemia 07/09/2014   Hyperlipidemia 10/01/2006   Insomnia 11/14/2012   Irritable bowel syndrome 09/10/2006   Lumbar back pain with radiculopathy affecting left lower extremity 11/13/2013   Metatarsal stress fracture 01/20/2016   Migraine headache 09/10/2006   Mild dementia 09/10/2022   MVP (mitral valve prolapse)    Onychomycosis 07/09/2014   Osteopenia 11/12/2012   Palpitations 09/10/2006   Paresthesia of foot, bilateral 07/09/2014   Postmenopausal atrophic vaginitis 10/01/2006   Recurrent falls 01/30/2022   Renal insufficiency 08/14/2022   Vasomotor rhinitis 02/02/2008   Vitamin D deficiency 07/14/2016   Past Surgical History:  Procedure Laterality Date   DENTAL SURGERY  09/2014   ROTATOR CUFF REPAIR Right    TONSILLECTOMY     Patient Active Problem List   Diagnosis Date Noted   Mild dementia 09/10/2022   Edema 08/14/2022   Renal insufficiency 08/14/2022   Recurrent falls 01/30/2022    Disequilibrium 10/25/2021   Heart murmur 10/25/2021   Vitamin D deficiency 07/14/2016   Hereditary and idiopathic peripheral neuropathy 01/10/2016   Paresthesia of foot, bilateral 07/09/2014   Hyperglycemia 07/09/2014   Onychomycosis 07/09/2014   Lumbar back pain with radiculopathy affecting left lower extremity 11/13/2013   Back pain 06/27/2013   Insomnia 11/14/2012   Osteopenia 11/12/2012   Vasomotor rhinitis 02/02/2008   Essential hypertension 10/06/2007   Hyperlipidemia 10/01/2006   Postmenopausal atrophic vaginitis 10/01/2006   Irritable bowel syndrome 09/10/2006   Disorder of bone and cartilage 09/10/2006    PCP: Danise Edge MD   REFERRING PROVIDER: Marcos Eke, PA-C  REFERRING DIAG:  Diagnosis  R26.81 (ICD-10-CM) - Gait instability  R53.1 (ICD-10-CM) - Weakness    THERAPY DIAG:  Muscle weakness (generalized)  Unsteadiness on feet  Difficulty in walking, not elsewhere classified  Repeated falls  Rationale for Evaluation and Treatment: Rehabilitation  ONSET DATE: chronic   SUBJECTIVE:   SUBJECTIVE STATEMENT:  Pt doing fine today, went [place] recently d/t death in her family, no pain today.  PERTINENT HISTORY: Back pain, disequilibrium, HTN, peripheral neuropathy, HLD, insomnia, low back pain with radiculopathy, IBS, metatarsal stress fx, dementia, osteopenia, recurrent falls, rotator cuff repair  PAIN:  Are you having pain? No  PRECAUTIONS: Fall  RED FLAGS: None   WEIGHT BEARING RESTRICTIONS: No  FALLS:  Has patient fallen in last 6 months? Yes. Number of  falls at least 2 falls in the past 6 months- one in closet reaching, one when legs buckled trying to get up off of couch   LIVING ENVIRONMENT: Lives with: lives alone and has home aide 3 days a week, family checks on her  Lives in: House/apartment Stairs: No Has following equipment at home: Environmental consultant - 2 wheeled, bed side commode, and Grab bars  OCCUPATION: retired   PLOF: Independent  with household mobility with device, Requires assistive device for independence, Needs assistance with ADLs, and Needs assistance with homemaking  PATIENT GOALS: not sure   NEXT MD VISIT: Referring in January 2025  OBJECTIVE:  Note: Objective measures were completed at Evaluation unless otherwise noted.    PATIENT SURVEYS:  ABC scale 600/1600 37.5%  COGNITION: Overall cognitive status: History of cognitive impairments - at baseline     SENSATION: Not tested      LOWER EXTREMITY MMT:  MMT Right eval Left eval  Hip flexion 4+ 4+  Hip extension    Hip abduction 4 4  Hip adduction    Hip internal rotation    Hip external rotation    Knee flexion    Knee extension 5 5  Ankle dorsiflexion 5 5  Ankle plantarflexion    Ankle inversion    Ankle eversion     (Blank rows = not tested)    FUNCTIONAL TESTS:  5 times sit to stand: 24 seconds, use of BUEs on chair, very poor eccentric control and muscle endurance  Timed up and go (TUG): 26.4 seconds no device, MinA from PT  3 minute walk test: 337ft no device, MinA for balance due to scissoring gait and poor balance recovery skills     TODAY'S TREATMENT:                                                                                                                              DATE:  01/27/23 Nustep L2x63min UE/LE Standing heel/toe raises x 10  Standing hip abduction x 10  Standing hip extension x 10  Seated ball squeeze hip ADD x 10  Seated marching x 12 bil  Review of safety with rollator, how and when to lock brakes, DO NOT use device to stand 01/20/23:  Nustep level 3 x 3 min UE's and LE's Seated LAQ's 3# 10x 2 Seated hip abd/ER with green t band 15x 2 Standing heel raises at counter 15x Standing alt hip abd at counter 15x Standing alt hip ext with red t band around thighs 15x Standing side stepping length of counter with red t band around thighs, 2 x Standing one hand on counter for alt  high marches,  15x Standing at kitchen sink for B heel/toe rocks 15x Standing in corner for [employer]  Instructed several times in brake management of new rollator which she brought in with her, required repeated instruction , poor recall noted     01/12/23- eval, care planning, education for  son/caregiver on POC   Nustep L3 x8 minutes for BLE strength     PATIENT EDUCATION:  Education details: HEP  Person educated: Patient Education method: Medical illustrator Education comprehension: verbalized understanding and needs further education  HOME EXERCISE PROGRAM: Access Code: [email or web address] URL: [email or web address] Date: 01/27/2023 Prepared by: Verta Ellen  Exercises - Heel Toe Raises with Counter Support  - 1 x daily - 7 x weekly - 2 sets - 10 reps - Standing Hip Abduction with Counter Support  - 1 x daily - 7 x weekly - 2 sets - 10 reps - Standing Hip Extension with Counter Support  - 1 x daily - 7 x weekly - 2 sets - 10 reps  ASSESSMENT:  CLINICAL IMPRESSION: Continued with general balance and fall prevention exercises today with patient. Overall she was able to complete all interventions with no fatigue or increased pain. She needs many cues during session for safety while using rollator, mostly to lock/unlock brakes at the correct times. Also provided new HEP today and explained to caregiver with patient.  OBJECTIVE IMPAIRMENTS: Abnormal gait, decreased activity tolerance, decreased balance, decreased knowledge of use of DME, decreased mobility, difficulty walking, decreased strength, and decreased safety awareness.   ACTIVITY LIMITATIONS: standing, stairs, transfers, bed mobility, and locomotion level  PARTICIPATION LIMITATIONS: cleaning, laundry, shopping, community activity, and church  PERSONAL FACTORS: Age, Behavior pattern, Education, Fitness, Past/current experiences, and Time since onset of injury/illness/exacerbation are also affecting patient's  functional outcome.   REHAB POTENTIAL: Good  CLINICAL DECISION MAKING: Stable/uncomplicated  EVALUATION COMPLEXITY: Low   GOALS: Goals reviewed with patient? Yes  SHORT TERM GOALS: Target date: 02/09/2023   Will be compliant with appropriate HEP with MinA from family and home aide  Baseline: Goal status: INITIAL  2.  Will demonstrate consistent safe use of RW during all functional gait based tasks to reduce fall risk  Baseline:  Goal status: INITIAL  3.  Family to be able to name 5/5 ways to reduce fall risk/ how to set up home to reduce fall risk  Baseline:  Goal status: INITIAL  4.  Will demonstrate consistent good technique with RW with all functional transfers  Baseline:  Goal status: INITIAL    LONG TERM GOALS: Target date: 03/09/2023    MMT to be 5/5 globally  Baseline:  Goal status: INITIAL  2.  Will ambulate at least 448ft in with LRAD and no more than S to show reduced fall risk/improved community access  Baseline:  Goal status: INITIAL  3.  Will complete TUG test in 15 seconds or less with LRAD and no more than S to show reduced fall risk  Baseline:  Goal status: INITIAL  4.  Will be able to complete 5xSTS test in 15 seconds or less to show improved functional strength/mobility  Baseline:  Goal status: INITIAL  5.  ABC score to improve by at least 20% to show subjective improvement with skilled PT services  Baseline:  Goal status: INITIAL     PLAN:  PT FREQUENCY: 1-2x/week  PT DURATION: 8 weeks  PLANNED INTERVENTIONS: 97164- PT Re-evaluation, 97110-Therapeutic exercises, 97530- Therapeutic activity, 97112- Neuromuscular re-education, 97535- Self Care, 54098- Manual therapy, [phone]- Gait training, Patient/Family education, Balance training, Stair training, and DME instructions  PLAN FOR NEXT SESSION: strength and balance focus, HEP assignment as appropriate given STM deficits   Darleene Cleaver, PTA  01/27/23 10:30 AM    Date of  referral: 12/22/22 Referring provider: Varney Biles PA-C  Referring diagnosis?  Diagnosis  R26.81 (ICD-10-CM) - Gait instability  R53.1 (ICD-10-CM) - Weakness   Treatment diagnosis? (if different than referring diagnosis) M62.81, R26.81, R26.2, R29.6  What was this (referring dx) caused by? Ongoing Issue  Ashby Dawes of Condition: Chronic (continuous duration > 3 months)   Laterality: Both  Current Functional Measure Score: Other ABC 37.5% (600/1600)  Objective measurements identify impairments when they are compared to normal values, the uninvolved extremity, and prior level of function.  [x]  Yes  []  No  Objective assessment of functional ability: Moderate functional limitations   Briefly describe symptoms: weakness, unsteadiness   How did symptoms start: chronic- over time  Average pain intensity:  Last 24 hours: 0/10  Past week: 0/10  How often does the pt experience symptoms? Constantly  How much have the symptoms interfered with usual daily activities? Moderately  How has condition changed since care began at this facility? NA - initial visit  In general, how is the patients overall health? Good   BACK PAIN (STarT Back Screening Tool) No

## 2023-02-03 ENCOUNTER — Ambulatory Visit: Payer: Medicare Other | Attending: Physician Assistant

## 2023-02-03 DIAGNOSIS — R2681 Unsteadiness on feet: Secondary | ICD-10-CM | POA: Insufficient documentation

## 2023-02-03 DIAGNOSIS — M6281 Muscle weakness (generalized): Secondary | ICD-10-CM | POA: Diagnosis not present

## 2023-02-03 DIAGNOSIS — R296 Repeated falls: Secondary | ICD-10-CM | POA: Insufficient documentation

## 2023-02-03 DIAGNOSIS — R262 Difficulty in walking, not elsewhere classified: Secondary | ICD-10-CM | POA: Insufficient documentation

## 2023-02-03 NOTE — Therapy (Signed)
OUTPATIENT PHYSICAL THERAPY LOWER EXTREMITY TREATMENT   Patient Name: Christine Henderson MRN: [id number] DOB:04/19/1930, 89 y.o., female Today's Date: 02/03/2023  END OF SESSION:  PT End of Session - 02/03/23 1108     Visit Number 4    Number of Visits 17    Date for PT Re-Evaluation 03/09/23    Authorization Type [employer] MCR    Authorization Time Period 01/12/23 to 03/09/23    Progress Note Due on Visit 10    PT Start Time 1018    PT Stop Time 1102    PT Time Calculation (min) 44 min    Activity Tolerance Patient tolerated treatment well    Behavior During Therapy [facility] for tasks assessed/performed                Past Medical History:  Diagnosis Date   Abdominal pain 06/26/2008   Back pain 06/27/2013   Neck and low back   Bilateral impacted cerumen 10/21/2020   Disequilibrium 10/25/2021   Edema 08/14/2022   Essential hypertension 10/06/2007   Fibrocystic breast disease    Heart murmur 10/25/2021   Hereditary and idiopathic peripheral neuropathy 01/10/2016   Hyperglycemia 07/09/2014   Hyperlipidemia 10/01/2006   Insomnia 11/14/2012   Irritable bowel syndrome 09/10/2006   Lumbar back pain with radiculopathy affecting left lower extremity 11/13/2013   Metatarsal stress fracture 01/20/2016   Migraine headache 09/10/2006   Mild dementia 09/10/2022   MVP (mitral valve prolapse)    Onychomycosis 07/09/2014   Osteopenia 11/12/2012   Palpitations 09/10/2006   Paresthesia of foot, bilateral 07/09/2014   Postmenopausal atrophic vaginitis 10/01/2006   Recurrent falls 01/30/2022   Renal insufficiency 08/14/2022   Vasomotor rhinitis 02/02/2008   Vitamin D deficiency 07/14/2016   Past Surgical History:  Procedure Laterality Date   DENTAL SURGERY  09/2014   ROTATOR CUFF REPAIR Right    TONSILLECTOMY     Patient Active Problem List   Diagnosis Date Noted   Mild dementia 09/10/2022   Edema 08/14/2022   Renal insufficiency 08/14/2022   Recurrent falls 01/30/2022    Disequilibrium 10/25/2021   Heart murmur 10/25/2021   Vitamin D deficiency 07/14/2016   Hereditary and idiopathic peripheral neuropathy 01/10/2016   Paresthesia of foot, bilateral 07/09/2014   Hyperglycemia 07/09/2014   Onychomycosis 07/09/2014   Lumbar back pain with radiculopathy affecting left lower extremity 11/13/2013   Back pain 06/27/2013   Insomnia 11/14/2012   Osteopenia 11/12/2012   Vasomotor rhinitis 02/02/2008   Essential hypertension 10/06/2007   Hyperlipidemia 10/01/2006   Postmenopausal atrophic vaginitis 10/01/2006   Irritable bowel syndrome 09/10/2006   Disorder of bone and cartilage 09/10/2006    PCP: Danise Edge MD   REFERRING PROVIDER: Marcos Eke, PA-C  REFERRING DIAG:  Diagnosis  R26.81 (ICD-10-CM) - Gait instability  R53.1 (ICD-10-CM) - Weakness    THERAPY DIAG:  Muscle weakness (generalized)  Unsteadiness on feet  Difficulty in walking, not elsewhere classified  Repeated falls  Rationale for Evaluation and Treatment: Rehabilitation  ONSET DATE: chronic   SUBJECTIVE:   SUBJECTIVE STATEMENT:  Pt reports pain in her back when she is bending over. She only did her HEP one day since last visit.  PERTINENT HISTORY: Back pain, disequilibrium, HTN, peripheral neuropathy, HLD, insomnia, low back pain with radiculopathy, IBS, metatarsal stress fx, dementia, osteopenia, recurrent falls, rotator cuff repair  PAIN:  Are you having pain? No  PRECAUTIONS: Fall  RED FLAGS: None   WEIGHT BEARING RESTRICTIONS: No  FALLS:  Has patient fallen  in last 6 months? Yes. Number of falls at least 2 falls in the past 6 months- one in closet reaching, one when legs buckled trying to get up off of couch   LIVING ENVIRONMENT: Lives with: lives alone and has home aide 3 days a week, family checks on her  Lives in: House/apartment Stairs: No Has following equipment at home: Environmental consultant - 2 wheeled, bed side commode, and Grab bars  OCCUPATION: retired    PLOF: Independent with household mobility with device, Requires assistive device for independence, Needs assistance with ADLs, and Needs assistance with homemaking  PATIENT GOALS: not sure   NEXT MD VISIT: Referring in January 2025  OBJECTIVE:  Note: Objective measures were completed at Evaluation unless otherwise noted.    PATIENT SURVEYS:  ABC scale 600/1600 37.5%  COGNITION: Overall cognitive status: History of cognitive impairments - at baseline     SENSATION: Not tested      LOWER EXTREMITY MMT:  MMT Right eval Left eval  Hip flexion 4+ 4+  Hip extension    Hip abduction 4 4  Hip adduction    Hip internal rotation    Hip external rotation    Knee flexion    Knee extension 5 5  Ankle dorsiflexion 5 5  Ankle plantarflexion    Ankle inversion    Ankle eversion     (Blank rows = not tested)    FUNCTIONAL TESTS:  5 times sit to stand: 24 seconds, use of BUEs on chair, very poor eccentric control and muscle endurance  Timed up and go (TUG): 26.4 seconds no device, MinA from PT  3 minute walk test: 328ft no device, MinA for balance due to scissoring gait and poor balance recovery skills     TODAY'S TREATMENT:                                                                                                                              DATE: 02/03/23  Nustep L2x41min UE/LE Standing heel raises 2x10 Seated ball squeeze 2x10 Seated marching 2x10 Standing hip abduction x 12 BLE Standing hip extension x 12 BLE  GAIT TRAINING: To normalize gait pattern and improve safety. Decreased stride length, decreased heel strike, flexed trunk  01/27/23 Nustep L2x68min UE/LE Standing heel/toe raises x 10  Standing hip abduction x 10  Standing hip extension x 10  Seated ball squeeze hip ADD x 10  Seated marching x 12 bil  Review of safety with rollator, how and when to lock brakes, DO NOT use device to stand 01/20/23:  Nustep level 3 x 3 min UE's and LE's Seated  LAQ's 3# 10x 2 Seated hip abd/ER with green t band 15x 2 Standing heel raises at counter 15x Standing alt hip abd at counter 15x Standing alt hip ext with red t band around thighs 15x Standing side stepping length of counter with red t band around thighs, 2 x Standing one hand on counter for alt  high marches, 15x Standing at kitchen sink for B heel/toe rocks 15x Standing in corner for alt marches 15x  Instructed several times in brake management of new rollator which she brought in with her, required repeated instruction , poor recall noted     01/12/23- eval, care planning, education for son/caregiver on POC   Nustep L3 x8 minutes for BLE strength     PATIENT EDUCATION:  Education details: HEP  Person educated: Patient Education method: Medical illustrator Education comprehension: verbalized understanding and needs further education  HOME EXERCISE PROGRAM: Access Code: [email or web address] URL: [email or web address] Date: 01/27/2023 Prepared by: Verta Ellen  Exercises - Heel Toe Raises with Counter Support  - 1 x daily - 7 x weekly - 2 sets - 10 reps - Standing Hip Abduction with Counter Support  - 1 x daily - 7 x weekly - 2 sets - 10 reps - Standing Hip Extension with Counter Support  - 1 x daily - 7 x weekly - 2 sets - 10 reps  ASSESSMENT:  CLINICAL IMPRESSION: Continued with general balance and fall prevention exercises today. Pt is now using rollator but needs continual reminders on how to safely use AD d/t cognition. She shows decreased stride length, decreased heel strike and flexed trunk during gait, which makes her a fall risk. She needs continual cues and instruction with exercises to ensure proper form and technique.   OBJECTIVE IMPAIRMENTS: Abnormal gait, decreased activity tolerance, decreased balance, decreased knowledge of use of DME, decreased mobility, difficulty walking, decreased strength, and decreased safety awareness.   ACTIVITY  LIMITATIONS: standing, stairs, transfers, bed mobility, and locomotion level  PARTICIPATION LIMITATIONS: cleaning, laundry, shopping, community activity, and church  PERSONAL FACTORS: Age, Behavior pattern, Education, Fitness, Past/current experiences, and Time since onset of injury/illness/exacerbation are also affecting patient's functional outcome.   REHAB POTENTIAL: Good  CLINICAL DECISION MAKING: Stable/uncomplicated  EVALUATION COMPLEXITY: Low   GOALS: Goals reviewed with patient? Yes  SHORT TERM GOALS: Target date: 02/09/2023   Will be compliant with appropriate HEP with MinA from family and home aide  Baseline: Goal status: INITIAL  2.  Will demonstrate consistent safe use of RW during all functional gait based tasks to reduce fall risk  Baseline:  Goal status: INITIAL  3.  Family to be able to name 5/5 ways to reduce fall risk/ how to set up home to reduce fall risk  Baseline:  Goal status: INITIAL  4.  Will demonstrate consistent good technique with RW with all functional transfers  Baseline:  Goal status: INITIAL    LONG TERM GOALS: Target date: 03/09/2023    MMT to be 5/5 globally  Baseline:  Goal status: INITIAL  2.  Will ambulate at least 452ft in with LRAD and no more than S to show reduced fall risk/improved community access  Baseline:  Goal status: INITIAL  3.  Will complete TUG test in 15 seconds or less with LRAD and no more than S to show reduced fall risk  Baseline:  Goal status: INITIAL  4.  Will be able to complete 5xSTS test in 15 seconds or less to show improved functional strength/mobility  Baseline:  Goal status: INITIAL  5.  ABC score to improve by at least 20% to show subjective improvement with skilled PT services  Baseline:  Goal status: INITIAL     PLAN:  PT FREQUENCY: 1-2x/week  PT DURATION: 8 weeks  PLANNED INTERVENTIONS: 97164- PT Re-evaluation, 97110-Therapeutic exercises, 97530- Therapeutic activity, [id number]  Neuromuscular re-education, 97535- Self  Care, [id number]- Manual therapy, [phone]- Gait training, Patient/Family education, Balance training, Stair training, and DME instructions  PLAN FOR NEXT SESSION: strength and balance focus, keep reviewing gait and transfers with rollator   Darleene Cleaver, PTA  02/03/23 11:12 AM    Date of referral: 12/22/22 Referring provider: Varney Biles PA-C  Referring diagnosis?  Diagnosis  R26.81 (ICD-10-CM) - Gait instability  R53.1 (ICD-10-CM) - Weakness   Treatment diagnosis? (if different than referring diagnosis) M62.81, R26.81, R26.2, R29.6  What was this (referring dx) caused by? Ongoing Issue  Ashby Dawes of Condition: Chronic (continuous duration > 3 months)   Laterality: Both  Current Functional Measure Score: Other ABC 37.5% (600/1600)  Objective measurements identify impairments when they are compared to normal values, the uninvolved extremity, and prior level of function.  [x]  Yes  []  No  Objective assessment of functional ability: Moderate functional limitations   Briefly describe symptoms: weakness, unsteadiness   How did symptoms start: chronic- over time  Average pain intensity:  Last 24 hours: 0/10  Past week: 0/10  How often does the pt experience symptoms? Constantly  How much have the symptoms interfered with usual daily activities? Moderately  How has condition changed since care began at this facility? NA - initial visit  In general, how is the patients overall health? Good   BACK PAIN (STarT Back Screening Tool) No

## 2023-02-05 ENCOUNTER — Ambulatory Visit: Payer: Medicare Other

## 2023-02-05 DIAGNOSIS — R2681 Unsteadiness on feet: Secondary | ICD-10-CM

## 2023-02-05 DIAGNOSIS — R296 Repeated falls: Secondary | ICD-10-CM | POA: Diagnosis not present

## 2023-02-05 DIAGNOSIS — M6281 Muscle weakness (generalized): Secondary | ICD-10-CM | POA: Diagnosis not present

## 2023-02-05 DIAGNOSIS — R262 Difficulty in walking, not elsewhere classified: Secondary | ICD-10-CM | POA: Diagnosis not present

## 2023-02-05 NOTE — Therapy (Signed)
OUTPATIENT PHYSICAL THERAPY LOWER EXTREMITY TREATMENT   Patient Name: Christine Henderson MRN: [id number] DOB:09/29/1930, 89 y.o., female Today's Date: 02/05/2023  END OF SESSION:  PT End of Session - 02/05/23 1102     Visit Number 5    Number of Visits 17    Date for PT Re-Evaluation 03/09/23    Authorization Type [employer] MCR    Authorization Time Period 01/12/23 to 03/09/23    Progress Note Due on Visit 10    PT Start Time 1019    PT Stop Time 1102    PT Time Calculation (min) 43 min    Activity Tolerance Patient tolerated treatment well    Behavior During Therapy [facility] for tasks assessed/performed                 Past Medical History:  Diagnosis Date   Abdominal pain 06/26/2008   Back pain 06/27/2013   Neck and low back   Bilateral impacted cerumen 10/21/2020   Disequilibrium 10/25/2021   Edema 08/14/2022   Essential hypertension 10/06/2007   Fibrocystic breast disease    Heart murmur 10/25/2021   Hereditary and idiopathic peripheral neuropathy 01/10/2016   Hyperglycemia 07/09/2014   Hyperlipidemia 10/01/2006   Insomnia 11/14/2012   Irritable bowel syndrome 09/10/2006   Lumbar back pain with radiculopathy affecting left lower extremity 11/13/2013   Metatarsal stress fracture 01/20/2016   Migraine headache 09/10/2006   Mild dementia 09/10/2022   MVP (mitral valve prolapse)    Onychomycosis 07/09/2014   Osteopenia 11/12/2012   Palpitations 09/10/2006   Paresthesia of foot, bilateral 07/09/2014   Postmenopausal atrophic vaginitis 10/01/2006   Recurrent falls 01/30/2022   Renal insufficiency 08/14/2022   Vasomotor rhinitis 02/02/2008   Vitamin D deficiency 07/14/2016   Past Surgical History:  Procedure Laterality Date   DENTAL SURGERY  09/2014   ROTATOR CUFF REPAIR Right    TONSILLECTOMY     Patient Active Problem List   Diagnosis Date Noted   Mild dementia 09/10/2022   Edema 08/14/2022   Renal insufficiency 08/14/2022   Recurrent falls 01/30/2022    Disequilibrium 10/25/2021   Heart murmur 10/25/2021   Vitamin D deficiency 07/14/2016   Hereditary and idiopathic peripheral neuropathy 01/10/2016   Paresthesia of foot, bilateral 07/09/2014   Hyperglycemia 07/09/2014   Onychomycosis 07/09/2014   Lumbar back pain with radiculopathy affecting left lower extremity 11/13/2013   Back pain 06/27/2013   Insomnia 11/14/2012   Osteopenia 11/12/2012   Vasomotor rhinitis 02/02/2008   Essential hypertension 10/06/2007   Hyperlipidemia 10/01/2006   Postmenopausal atrophic vaginitis 10/01/2006   Irritable bowel syndrome 09/10/2006   Disorder of bone and cartilage 09/10/2006    PCP: Danise Edge MD   REFERRING PROVIDER: Marcos Eke, PA-C  REFERRING DIAG:  Diagnosis  R26.81 (ICD-10-CM) - Gait instability  R53.1 (ICD-10-CM) - Weakness    THERAPY DIAG:  Muscle weakness (generalized)  Unsteadiness on feet  Difficulty in walking, not elsewhere classified  Repeated falls  Rationale for Evaluation and Treatment: Rehabilitation  ONSET DATE: chronic   SUBJECTIVE:   SUBJECTIVE STATEMENT:  Pt reports pain in her back when she is bending over. She only did her HEP one day since last visit.  PERTINENT HISTORY: Back pain, disequilibrium, HTN, peripheral neuropathy, HLD, insomnia, low back pain with radiculopathy, IBS, metatarsal stress fx, dementia, osteopenia, recurrent falls, rotator cuff repair  PAIN:  Are you having pain? No  PRECAUTIONS: Fall  RED FLAGS: None   WEIGHT BEARING RESTRICTIONS: No  FALLS:  Has patient  fallen in last 6 months? Yes. Number of falls at least 2 falls in the past 6 months- one in closet reaching, one when legs buckled trying to get up off of couch   LIVING ENVIRONMENT: Lives with: lives alone and has home aide 3 days a week, family checks on her  Lives in: House/apartment Stairs: No Has following equipment at home: Environmental consultant - 2 wheeled, bed side commode, and Grab bars  OCCUPATION: retired    PLOF: Independent with household mobility with device, Requires assistive device for independence, Needs assistance with ADLs, and Needs assistance with homemaking  PATIENT GOALS: not sure   NEXT MD VISIT: Referring in January 2025  OBJECTIVE:  Note: Objective measures were completed at Evaluation unless otherwise noted.    PATIENT SURVEYS:  ABC scale 600/1600 37.5%  COGNITION: Overall cognitive status: History of cognitive impairments - at baseline     SENSATION: Not tested      LOWER EXTREMITY MMT:  MMT Right eval Left eval  Hip flexion 4+ 4+  Hip extension    Hip abduction 4 4  Hip adduction    Hip internal rotation    Hip external rotation    Knee flexion    Knee extension 5 5  Ankle dorsiflexion 5 5  Ankle plantarflexion    Ankle inversion    Ankle eversion     (Blank rows = not tested)    FUNCTIONAL TESTS:  5 times sit to stand: 24 seconds, use of BUEs on chair, very poor eccentric control and muscle endurance  Timed up and go (TUG): 26.4 seconds no device, MinA from PT  3 minute walk test: 360ft no device, MinA for balance due to scissoring gait and poor balance recovery skills     TODAY'S TREATMENT:                                                                                                                              DATE: 02/05/23  Nustep L4x52min UE/LE Standing HS curls x 10 BLE Mini squats at counter 10x3" Standing hip abduction x 10 BLE- cues to avoid twisting hips when raising Seated marching 1lb x 20 BLE- cues to sit up straight and engage core Seated SAQ BLE 1lb x 10  Seated trunk rotation x10; chin tucks x 10  Sit to stands 5x w/ GTB at knees to avoid genu valgus  02/03/23  Nustep L2x24min UE/LE Standing heel raises 2x10 Seated ball squeeze 2x10 Seated marching 2x10 Standing hip abduction x 12 BLE Standing hip extension x 12 BLE  GAIT TRAINING: To normalize gait pattern and improve safety. Decreased stride length, decreased  heel strike, flexed trunk  01/27/23 Nustep L2x50min UE/LE Standing heel/toe raises x 10  Standing hip abduction x 10  Standing hip extension x 10  Seated ball squeeze hip ADD x 10  Seated marching x 12 bil  Review of safety with rollator, how and when to lock brakes, DO NOT use  device to stand PATIENT EDUCATION:  Education details: HEP  Person educated: Patient Education method: Medical illustrator Education comprehension: verbalized understanding and needs further education  HOME EXERCISE PROGRAM: Access Code: [email or web address] URL: [email or web address] Date: 01/27/2023 Prepared by: Verta Ellen  Exercises - Heel Toe Raises with Counter Support  - 1 x daily - 7 x weekly - 2 sets - 10 reps - Standing Hip Abduction with Counter Support  - 1 x daily - 7 x weekly - 2 sets - 10 reps - Standing Hip Extension with Counter Support  - 1 x daily - 7 x weekly - 2 sets - 10 reps  ASSESSMENT:  CLINICAL IMPRESSION: Pt is tolerating more exercises with less rest required in between sets. Continued with progression of proximal LE strengthening to improve stability with daily task. Pt requires cues with gait training to keep close proximity to walker. Instruction given with sitting exercises for upright posture and to engage core.   OBJECTIVE IMPAIRMENTS: Abnormal gait, decreased activity tolerance, decreased balance, decreased knowledge of use of DME, decreased mobility, difficulty walking, decreased strength, and decreased safety awareness.   ACTIVITY LIMITATIONS: standing, stairs, transfers, bed mobility, and locomotion level  PARTICIPATION LIMITATIONS: cleaning, laundry, shopping, community activity, and church  PERSONAL FACTORS: Age, Behavior pattern, Education, Fitness, Past/current experiences, and Time since onset of injury/illness/exacerbation are also affecting patient's functional outcome.   REHAB POTENTIAL: Good  CLINICAL DECISION MAKING:  Stable/uncomplicated  EVALUATION COMPLEXITY: Low   GOALS: Goals reviewed with patient? Yes  SHORT TERM GOALS: Target date: 02/09/2023   Will be compliant with appropriate HEP with MinA from family and home aide  Baseline: Goal status: MET- 02/05/23  2.  Will demonstrate consistent safe use of RW during all functional gait based tasks to reduce fall risk  Baseline:  Goal status: INITIAL  3.  Family to be able to name 5/5 ways to reduce fall risk/ how to set up home to reduce fall risk  Baseline:  Goal status: INITIAL  4.  Will demonstrate consistent good technique with RW with all functional transfers  Baseline:  Goal status: INITIAL    LONG TERM GOALS: Target date: 03/09/2023    MMT to be 5/5 globally  Baseline:  Goal status: INITIAL  2.  Will ambulate at least 456ft in with LRAD and no more than S to show reduced fall risk/improved community access  Baseline:  Goal status: INITIAL  3.  Will complete TUG test in 15 seconds or less with LRAD and no more than S to show reduced fall risk  Baseline:  Goal status: INITIAL  4.  Will be able to complete 5xSTS test in 15 seconds or less to show improved functional strength/mobility  Baseline:  Goal status: INITIAL  5.  ABC score to improve by at least 20% to show subjective improvement with skilled PT services  Baseline:  Goal status: INITIAL     PLAN:  PT FREQUENCY: 1-2x/week  PT DURATION: 8 weeks  PLANNED INTERVENTIONS: 97164- PT Re-evaluation, 97110-Therapeutic exercises, 97530- Therapeutic activity, 97112- Neuromuscular re-education, 97535- Self Care, 75643- Manual therapy, [phone]- Gait training, Patient/Family education, Balance training, Stair training, and DME instructions  PLAN FOR NEXT SESSION: strength and balance focus, keep reviewing gait and transfers with rollator   Darleene Cleaver, PTA  02/05/23 11:53 AM    Date of referral: 12/22/22 Referring provider: Varney Biles PA-C  Referring  diagnosis?  Diagnosis  R26.81 (ICD-10-CM) - Gait instability  R53.1 (ICD-10-CM) - Weakness   Treatment diagnosis? (  if different than referring diagnosis) M62.81, R26.81, R26.2, R29.6  What was this (referring dx) caused by? Ongoing Issue  Ashby Dawes of Condition: Chronic (continuous duration > 3 months)   Laterality: Both  Current Functional Measure Score: Other ABC 37.5% (600/1600)  Objective measurements identify impairments when they are compared to normal values, the uninvolved extremity, and prior level of function.  [x]  Yes  []  No  Objective assessment of functional ability: Moderate functional limitations   Briefly describe symptoms: weakness, unsteadiness   How did symptoms start: chronic- over time  Average pain intensity:  Last 24 hours: 0/10  Past week: 0/10  How often does the pt experience symptoms? Constantly  How much have the symptoms interfered with usual daily activities? Moderately  How has condition changed since care began at this facility? NA - initial visit  In general, how is the patients overall health? Good   BACK PAIN (STarT Back Screening Tool) No

## 2023-02-10 ENCOUNTER — Ambulatory Visit: Payer: Medicare Other

## 2023-02-16 ENCOUNTER — Ambulatory Visit: Payer: Medicare Other | Admitting: Family Medicine

## 2023-02-23 ENCOUNTER — Emergency Department (HOSPITAL_COMMUNITY): Payer: Medicare Other

## 2023-02-23 ENCOUNTER — Inpatient Hospital Stay (HOSPITAL_COMMUNITY)
Admission: EM | Admit: 2023-02-23 | Discharge: 2023-03-06 | DRG: 082 | Disposition: A | Discharge status: Skilled Nursing Facility | Payer: Medicare Other | Attending: Internal Medicine | Admitting: Internal Medicine

## 2023-02-23 ENCOUNTER — Other Ambulatory Visit: Payer: Self-pay

## 2023-02-23 DIAGNOSIS — R404 Transient alteration of awareness: Secondary | ICD-10-CM | POA: Diagnosis not present

## 2023-02-23 DIAGNOSIS — Z8673 Personal history of transient ischemic attack (TIA), and cerebral infarction without residual deficits: Secondary | ICD-10-CM | POA: Diagnosis not present

## 2023-02-23 DIAGNOSIS — I491 Atrial premature depolarization: Secondary | ICD-10-CM | POA: Diagnosis not present

## 2023-02-23 DIAGNOSIS — I639 Cerebral infarction, unspecified: Secondary | ICD-10-CM | POA: Diagnosis not present

## 2023-02-23 DIAGNOSIS — R4701 Aphasia: Secondary | ICD-10-CM | POA: Diagnosis present

## 2023-02-23 DIAGNOSIS — I6381 Other cerebral infarction due to occlusion or stenosis of small artery: Secondary | ICD-10-CM | POA: Diagnosis present

## 2023-02-23 DIAGNOSIS — Z743 Need for continuous supervision: Secondary | ICD-10-CM | POA: Diagnosis not present

## 2023-02-23 DIAGNOSIS — R42 Dizziness and giddiness: Secondary | ICD-10-CM | POA: Diagnosis not present

## 2023-02-23 DIAGNOSIS — Z043 Encounter for examination and observation following other accident: Secondary | ICD-10-CM | POA: Diagnosis not present

## 2023-02-23 DIAGNOSIS — I341 Nonrheumatic mitral (valve) prolapse: Secondary | ICD-10-CM | POA: Diagnosis present

## 2023-02-23 DIAGNOSIS — M8580 Other specified disorders of bone density and structure, unspecified site: Secondary | ICD-10-CM | POA: Diagnosis not present

## 2023-02-23 DIAGNOSIS — S066X0A Traumatic subarachnoid hemorrhage without loss of consciousness, initial encounter: Secondary | ICD-10-CM | POA: Diagnosis not present

## 2023-02-23 DIAGNOSIS — L89891 Pressure ulcer of other site, stage 1: Secondary | ICD-10-CM | POA: Diagnosis not present

## 2023-02-23 DIAGNOSIS — S0636AA Traumatic hemorrhage of cerebrum, unspecified, with loss of consciousness status unknown, initial encounter: Secondary | ICD-10-CM | POA: Diagnosis not present

## 2023-02-23 DIAGNOSIS — Z885 Allergy status to narcotic agent status: Secondary | ICD-10-CM | POA: Diagnosis not present

## 2023-02-23 DIAGNOSIS — S065XAA Traumatic subdural hemorrhage with loss of consciousness status unknown, initial encounter: Secondary | ICD-10-CM | POA: Diagnosis not present

## 2023-02-23 DIAGNOSIS — M51369 Other intervertebral disc degeneration, lumbar region without mention of lumbar back pain or lower extremity pain: Secondary | ICD-10-CM | POA: Diagnosis not present

## 2023-02-23 DIAGNOSIS — I618 Other nontraumatic intracerebral hemorrhage: Secondary | ICD-10-CM | POA: Diagnosis not present

## 2023-02-23 DIAGNOSIS — R109 Unspecified abdominal pain: Secondary | ICD-10-CM | POA: Diagnosis not present

## 2023-02-23 DIAGNOSIS — Z66 Do not resuscitate: Secondary | ICD-10-CM | POA: Diagnosis present

## 2023-02-23 DIAGNOSIS — Z8249 Family history of ischemic heart disease and other diseases of the circulatory system: Secondary | ICD-10-CM | POA: Diagnosis not present

## 2023-02-23 DIAGNOSIS — R410 Disorientation, unspecified: Secondary | ICD-10-CM | POA: Diagnosis not present

## 2023-02-23 DIAGNOSIS — R29706 NIHSS score 6: Secondary | ICD-10-CM | POA: Diagnosis present

## 2023-02-23 DIAGNOSIS — I10 Essential (primary) hypertension: Secondary | ICD-10-CM | POA: Diagnosis present

## 2023-02-23 DIAGNOSIS — I161 Hypertensive emergency: Secondary | ICD-10-CM | POA: Diagnosis not present

## 2023-02-23 DIAGNOSIS — I69198 Other sequelae of nontraumatic intracerebral hemorrhage: Secondary | ICD-10-CM | POA: Diagnosis not present

## 2023-02-23 DIAGNOSIS — I680 Cerebral amyloid angiopathy: Secondary | ICD-10-CM | POA: Diagnosis not present

## 2023-02-23 DIAGNOSIS — S066XAA Traumatic subarachnoid hemorrhage with loss of consciousness status unknown, initial encounter: Secondary | ICD-10-CM | POA: Diagnosis not present

## 2023-02-23 DIAGNOSIS — E559 Vitamin D deficiency, unspecified: Secondary | ICD-10-CM | POA: Diagnosis not present

## 2023-02-23 DIAGNOSIS — G629 Polyneuropathy, unspecified: Secondary | ICD-10-CM | POA: Diagnosis not present

## 2023-02-23 DIAGNOSIS — Z7401 Bed confinement status: Secondary | ICD-10-CM | POA: Diagnosis not present

## 2023-02-23 DIAGNOSIS — F03A0 Unspecified dementia, mild, without behavioral disturbance, psychotic disturbance, mood disturbance, and anxiety: Secondary | ICD-10-CM | POA: Diagnosis present

## 2023-02-23 DIAGNOSIS — W19XXXA Unspecified fall, initial encounter: Secondary | ICD-10-CM | POA: Diagnosis present

## 2023-02-23 DIAGNOSIS — I619 Nontraumatic intracerebral hemorrhage, unspecified: Principal | ICD-10-CM | POA: Diagnosis present

## 2023-02-23 DIAGNOSIS — S06350A Traumatic hemorrhage of left cerebrum without loss of consciousness, initial encounter: Secondary | ICD-10-CM | POA: Diagnosis not present

## 2023-02-23 DIAGNOSIS — I6782 Cerebral ischemia: Secondary | ICD-10-CM | POA: Diagnosis not present

## 2023-02-23 DIAGNOSIS — Y92009 Unspecified place in unspecified non-institutional (private) residence as the place of occurrence of the external cause: Secondary | ICD-10-CM

## 2023-02-23 DIAGNOSIS — E854 Organ-limited amyloidosis: Principal | ICD-10-CM | POA: Diagnosis present

## 2023-02-23 DIAGNOSIS — R1312 Dysphagia, oropharyngeal phase: Secondary | ICD-10-CM | POA: Diagnosis not present

## 2023-02-23 DIAGNOSIS — Z1152 Encounter for screening for COVID-19: Secondary | ICD-10-CM

## 2023-02-23 DIAGNOSIS — E785 Hyperlipidemia, unspecified: Secondary | ICD-10-CM | POA: Diagnosis not present

## 2023-02-23 DIAGNOSIS — I959 Hypotension, unspecified: Secondary | ICD-10-CM | POA: Diagnosis not present

## 2023-02-23 DIAGNOSIS — K589 Irritable bowel syndrome without diarrhea: Secondary | ICD-10-CM | POA: Diagnosis not present

## 2023-02-23 DIAGNOSIS — I129 Hypertensive chronic kidney disease with stage 1 through stage 4 chronic kidney disease, or unspecified chronic kidney disease: Secondary | ICD-10-CM | POA: Diagnosis present

## 2023-02-23 DIAGNOSIS — R531 Weakness: Secondary | ICD-10-CM | POA: Diagnosis not present

## 2023-02-23 DIAGNOSIS — L8990 Pressure ulcer of unspecified site, unspecified stage: Secondary | ICD-10-CM | POA: Insufficient documentation

## 2023-02-23 DIAGNOSIS — R296 Repeated falls: Secondary | ICD-10-CM | POA: Diagnosis present

## 2023-02-23 DIAGNOSIS — G936 Cerebral edema: Secondary | ICD-10-CM | POA: Diagnosis not present

## 2023-02-23 DIAGNOSIS — I69128 Other speech and language deficits following nontraumatic intracerebral hemorrhage: Secondary | ICD-10-CM | POA: Diagnosis not present

## 2023-02-23 DIAGNOSIS — I700 Atherosclerosis of aorta: Secondary | ICD-10-CM | POA: Diagnosis not present

## 2023-02-23 DIAGNOSIS — S0633AA Contusion and laceration of cerebrum, unspecified, with loss of consciousness status unknown, initial encounter: Secondary | ICD-10-CM | POA: Diagnosis not present

## 2023-02-23 DIAGNOSIS — Z79899 Other long term (current) drug therapy: Secondary | ICD-10-CM

## 2023-02-23 DIAGNOSIS — M5030 Other cervical disc degeneration, unspecified cervical region: Secondary | ICD-10-CM | POA: Diagnosis not present

## 2023-02-23 LAB — COMPREHENSIVE METABOLIC PANEL
ALT: 41 U/L (ref 0–44)
AST: 68 U/L — ABNORMAL HIGH (ref 15–41)
Albumin: 3.8 g/dL (ref 3.5–5.0)
Alkaline Phosphatase: 65 U/L (ref 38–126)
Anion gap: 12 (ref 5–15)
BUN: 29 mg/dL — ABNORMAL HIGH (ref 8–23)
CO2: 20 mmol/L — ABNORMAL LOW (ref 22–32)
Calcium: 9.2 mg/dL (ref 8.9–10.3)
Chloride: 103 mmol/L (ref 98–111)
Creatinine, Ser: 1.05 mg/dL — ABNORMAL HIGH (ref 0.44–1.00)
GFR, Estimated: 50 mL/min — ABNORMAL LOW (ref >60)
Glucose, Bld: 157 mg/dL — ABNORMAL HIGH (ref 70–99)
Potassium: 4 mmol/L (ref 3.5–5.1)
Sodium: 135 mmol/L (ref 135–145)
Total Bilirubin: 2.7 mg/dL — ABNORMAL HIGH (ref <1.2)
Total Protein: 7.4 g/dL (ref 6.5–8.1)

## 2023-02-23 LAB — CBC WITH DIFFERENTIAL/PLATELET
Abs Immature Granulocytes: 0.1 10*3/uL — ABNORMAL HIGH (ref 0.00–0.07)
Basophils Absolute: 0 10*3/uL (ref 0.0–0.1)
Basophils Relative: 0 %
Eosinophils Absolute: 0 10*3/uL (ref 0.0–0.5)
Eosinophils Relative: 0 %
HCT: 45.7 % (ref 36.0–46.0)
Hemoglobin: 15.1 g/dL — ABNORMAL HIGH (ref 12.0–15.0)
Immature Granulocytes: 1 %
Lymphocytes Relative: 4 %
Lymphs Abs: 0.7 10*3/uL (ref 0.7–4.0)
MCH: 31.3 pg (ref 26.0–34.0)
MCHC: 33 g/dL (ref 30.0–36.0)
MCV: 94.6 fL (ref 80.0–100.0)
Monocytes Absolute: 0.6 10*3/uL (ref 0.1–1.0)
Monocytes Relative: 4 %
Neutro Abs: 15 10*3/uL — ABNORMAL HIGH (ref 1.7–7.7)
Neutrophils Relative %: 91 %
Platelets: 309 10*3/uL (ref 150–400)
RBC: 4.83 MIL/uL (ref 3.87–5.11)
RDW: 14.8 % (ref 11.5–15.5)
WBC: 16.4 10*3/uL — ABNORMAL HIGH (ref 4.0–10.5)
nRBC: 0 % (ref 0.0–0.2)

## 2023-02-23 LAB — URINALYSIS, W/ REFLEX TO CULTURE (INFECTION SUSPECTED)
Bacteria, UA: NONE SEEN
Bilirubin Urine: NEGATIVE
Glucose, UA: 50 mg/dL — AB
Ketones, ur: 20 mg/dL — AB
Leukocytes,Ua: NEGATIVE
Nitrite: NEGATIVE
Protein, ur: 30 mg/dL — AB
Specific Gravity, Urine: 1.029 (ref 1.005–1.030)
pH: 6 (ref 5.0–8.0)

## 2023-02-23 LAB — I-STAT CHEM 8, ED
BUN: 32 mg/dL — ABNORMAL HIGH (ref 8–23)
Calcium, Ion: 1.03 mmol/L — ABNORMAL LOW (ref 1.15–1.40)
Chloride: 105 mmol/L (ref 98–111)
Creatinine, Ser: 0.9 mg/dL (ref 0.44–1.00)
Glucose, Bld: 147 mg/dL — ABNORMAL HIGH (ref 70–99)
HCT: 43 % (ref 36.0–46.0)
Hemoglobin: 14.6 g/dL (ref 12.0–15.0)
Potassium: 4 mmol/L (ref 3.5–5.1)
Sodium: 137 mmol/L (ref 135–145)
TCO2: 20 mmol/L — ABNORMAL LOW (ref 22–32)

## 2023-02-23 LAB — RAPID URINE DRUG SCREEN, HOSP PERFORMED
Amphetamines: NOT DETECTED
Barbiturates: NOT DETECTED
Benzodiazepines: NOT DETECTED
Cocaine: NOT DETECTED
Opiates: NOT DETECTED
Tetrahydrocannabinol: NOT DETECTED

## 2023-02-23 LAB — CBG MONITORING, ED: Glucose-Capillary: 139 mg/dL — ABNORMAL HIGH (ref 70–99)

## 2023-02-23 LAB — CK: Total CK: 1152 U/L — ABNORMAL HIGH (ref 38–234)

## 2023-02-23 LAB — SALICYLATE LEVEL: Salicylate Lvl: <7 mg/dL — ABNORMAL LOW (ref 7.0–30.0)

## 2023-02-23 LAB — ACETAMINOPHEN LEVEL: Acetaminophen (Tylenol), Serum: <10 ug/mL — ABNORMAL LOW (ref 10–30)

## 2023-02-23 LAB — ETHANOL: Alcohol, Ethyl (B): <10 mg/dL (ref <10)

## 2023-02-23 MED ORDER — CLEVIDIPINE BUTYRATE 0.5 MG/ML IV EMUL
0.0000 mg/h | INTRAVENOUS | Status: DC
Start: 2023-02-23 — End: 2023-02-23
  Administered 2023-02-23: 6 mg/h via INTRAVENOUS
  Administered 2023-02-23: 2 mg/h via INTRAVENOUS
  Filled 2023-02-23: qty 100

## 2023-02-23 MED ORDER — ACETAMINOPHEN 160 MG/5ML PO SOLN: 650.0000 mg | ORAL | Status: DC | PRN

## 2023-02-23 MED ORDER — STROKE: EARLY STAGES OF RECOVERY BOOK: Freq: Once | Status: AC

## 2023-02-23 MED ORDER — IOHEXOL 350 MG/ML SOLN
75.0000 mL | Freq: Once | INTRAVENOUS | Status: AC | PRN
  Administered 2023-02-23: 75 mL via INTRAVENOUS

## 2023-02-23 MED ORDER — CLEVIDIPINE BUTYRATE 0.5 MG/ML IV EMUL
0.0000 mg/h | INTRAVENOUS | Status: DC
  Administered 2023-02-23: 1 mg/h via INTRAVENOUS

## 2023-02-23 MED ORDER — SENNOSIDES-DOCUSATE SODIUM 8.6-50 MG PO TABS
1.0000 | ORAL_TABLET | Freq: Two times a day (BID) | ORAL | Status: DC
  Administered 2023-02-25 – 2023-03-06 (×15): 1 via ORAL
  Filled 2023-02-23 (×15): qty 1

## 2023-02-23 MED ORDER — PANTOPRAZOLE SODIUM 40 MG IV SOLR
40.0000 mg | Freq: Every day | INTRAVENOUS | Status: DC
  Administered 2023-02-24 (×2): 40 mg via INTRAVENOUS
  Filled 2023-02-23 (×2): qty 10

## 2023-02-23 MED ORDER — ACETAMINOPHEN 325 MG PO TABS
650.0000 mg | ORAL_TABLET | ORAL | Status: DC | PRN
  Administered 2023-02-26 – 2023-03-06 (×6): 650 mg via ORAL
  Filled 2023-02-23 (×6): qty 2

## 2023-02-23 MED ORDER — ACETAMINOPHEN 650 MG RE SUPP: 650.0000 mg | RECTAL | Status: DC | PRN

## 2023-02-23 MED ORDER — CHLORHEXIDINE GLUCONATE CLOTH 2 % EX PADS
6.0000 | MEDICATED_PAD | Freq: Every day | CUTANEOUS | Status: DC
  Administered 2023-02-24 – 2023-02-28 (×5): 6 via TOPICAL

## 2023-02-23 NOTE — ED Provider Notes (Signed)
[place] EMERGENCY DEPARTMENT AT [facility] Provider Note  HPI   Christine Henderson is a 89 y.o. female patient with a PMHx of hypertension hyperlipidemia mild dementia who is here today after being found down.  Patient was last seen normal on 12/22 at 2 PM.  The patient went out to lunch with family members and went to a Christmas party.  Was then Today 1223 at 4 PM by caregiver who found her down and altered.  She was seen for evaluation.  Per family, she had no preceding symptoms that they are aware of and she was normal at 1  She has mild dementia, some memory issues, but is able to hold a full conversation and is usually alert oriented at baseline  ROS Negative except as per HPI  Medical Decision Making   Upon presentation, the patient is afebrile HDS  Initial assessment GCS was 2, 2, 5, her eyes open to pain, she was making sounds, and her arms crossed midline secondary to pain.  However during my exam, her GCS improved to 4, 4, 6.  Her eyes were open spontaneously, she was speaking but confused, and was not following some basic commands such as move your arm and wiggle your toes.  Pupils are 1 mm bilaterally, equal, response to pain in all 4 extremities  For this patient, labs are largely unremarkable in terms of a cause of her altered mental status.  Acetaminophen salicylate level negative, she has no major metabolic derangement creatinine 1.05, glucose check upfront was normal.  She has leukocytosis 16 hemoglobin 15.1 ethanol is negative, and drug screen ultimately came back negative and there are no overt signs of infection in her urine.  This patient has an IPH unfortunately, neurology was immediately consulted, antihypertensive medication was initiated.  Reach out to neurosurgery, nothing acute to do.  Neurology is going admit this patient.  I counseled the family, the patient is protecting her airway but continues to be altered.  Unclear if this was traumatic or not  there is some subarachnoid hemorrhage overlying IPH.   No diagnosis found.  @DISPOSITION @  Rx / DC Orders ED Discharge Orders     None        Past Medical History:  Diagnosis Date   Abdominal pain 06/26/2008   Back pain 06/27/2013   Neck and low back   Bilateral impacted cerumen 10/21/2020   Disequilibrium 10/25/2021   Edema 08/14/2022   Essential hypertension 10/06/2007   Fibrocystic breast disease    Heart murmur 10/25/2021   Hereditary and idiopathic peripheral neuropathy 01/10/2016   Hyperglycemia 07/09/2014   Hyperlipidemia 10/01/2006   Insomnia 11/14/2012   Irritable bowel syndrome 09/10/2006   Lumbar back pain with radiculopathy affecting left lower extremity 11/13/2013   Metatarsal stress fracture 01/20/2016   Migraine headache 09/10/2006   Mild dementia 09/10/2022   MVP (mitral valve prolapse)    Onychomycosis 07/09/2014   Osteopenia 11/12/2012   Palpitations 09/10/2006   Paresthesia of foot, bilateral 07/09/2014   Postmenopausal atrophic vaginitis 10/01/2006   Recurrent falls 01/30/2022   Renal insufficiency 08/14/2022   Vasomotor rhinitis 02/02/2008   Vitamin D deficiency 07/14/2016   Past Surgical History:  Procedure Laterality Date   DENTAL SURGERY  09/2014   ROTATOR CUFF REPAIR Right    TONSILLECTOMY     Family History  Problem Relation Age of Onset   Alzheimer's disease Mother    Dementia Father        unspecified type; w/advanced age  Inflammatory bowel disease Sister    Alzheimer's disease Sister    Pancreatic cancer Maternal Grandfather        unknown details   Hyperlipidemia Son    Obesity Son    Hyperlipidemia Son    Heart disease Paternal Uncle    Coronary artery disease Neg Hx    Colon cancer Neg Hx    Stomach cancer Neg Hx    Social History   Socioeconomic History   Marital status: Widowed    Spouse name: Not on file   Number of children: 4   Years of education: 16   Highest education level: Bachelor's degree (e.g.,  BA, AB, BS)  Occupational History   Occupation: Retired    Associate Professor: RETIRED  Tobacco Use   Smoking status: Never   Smokeless tobacco: Never  Vaping Use   Vaping status: Never Used  Substance and Sexual Activity   Alcohol use: No   Drug use: No   Sexual activity: Never    Birth control/protection: None    Comment: regular exercise, lives at the [place], widowed in 2014  Other Topics Concern   Not on file  Social History Narrative   Left handed   Caffeine 3-4 glasses of tea   Lives alone and does her own medicine      Comes in with son stephen   Social Drivers of Health   Financial Resource Strain: Low Risk  (10/11/2020)   Overall Financial Resource Strain (CARDIA)    Difficulty of Paying Living Expenses: Not hard at all  Food Insecurity: No Food Insecurity (10/11/2020)   Hunger Vital Sign    Worried About Running Out of Food in the Last Year: Never true    Ran Out of Food in the Last Year: Never true  Transportation Needs: No Transportation Needs (10/11/2020)   PRAPARE - Administrator, Civil Service (Medical): No    Lack of Transportation (Non-Medical): No  Physical Activity: Insufficiently Active (10/11/2020)   Exercise Vital Sign    Days of Exercise per Week: 4 days    Minutes of Exercise per Session: 30 min  Stress: No Stress Concern Present (10/11/2020)   [employer] of Occupational Health - Occupational Stress Questionnaire    Feeling of Stress : Not at all  Social Connections: Moderately Isolated (10/11/2020)   Social Connection and Isolation Panel [NHANES]    Frequency of Communication with Friends and Family: More than three times a week    Frequency of Social Gatherings with Friends and Family: More than three times a week    Attends Religious Services: More than 4 times per year    Active Member of [employer] or Organizations: No    Attends Banker Meetings: Never    Marital Status: Widowed  Intimate Partner Violence: Not At Risk  (10/11/2020)   Humiliation, Afraid, Rape, and Kick questionnaire    Fear of Current or Ex-Partner: No    Emotionally Abused: No    Physically Abused: No    Sexually Abused: No     Physical Exam   Vitals:   02/23/23 2300 02/23/23 2303 02/23/23 2310 02/23/23 2320  BP: 108/81 127/81 120/77 139/80  Pulse: 92 97 96 97  Resp: 15 20 17  (!) 23  Temp:      TempSrc:      SpO2: 99% 100% 97% 97%    Physical Exam Vitals and nursing note reviewed.  Constitutional:      General: She is not in acute distress.  Appearance: She is well-developed.  HENT:     Head: Normocephalic and atraumatic.     Right Ear: External ear normal.     Left Ear: External ear normal.     Mouth/Throat:     Mouth: Mucous membranes are moist.  Eyes:     Conjunctiva/sclera: Conjunctivae normal.  Cardiovascular:     Rate and Rhythm: Normal rate and regular rhythm.     Heart sounds: No murmur heard. Pulmonary:     Effort: Pulmonary effort is normal. No respiratory distress.     Breath sounds: Normal breath sounds.  Abdominal:     Palpations: Abdomen is soft.     Tenderness: There is no abdominal tenderness.  Musculoskeletal:        General: No swelling.     Cervical back: Neck supple.     Comments: Open there is an abrasion and bruise over the left upper back, otherwise there is no cervical thoracic or lumbar obvious midline tenderness, no other obvious bony point prominence tenderness or other obvious signs of trauma  Skin:    General: Skin is warm and dry.     Capillary Refill: Capillary refill takes less than 2 seconds.  Neurological:     Mental Status: She is alert.     Comments: Initial assessment GCS was 2, 2, 5, her eyes open to pain, she was making sounds, and her arms crossed midline secondary to pain.  However during my exam, her GCS improved to 4, 4, 6.  Her eyes were open spontaneously, she was speaking but confused, and was not following some basic commands such as move your arm and wiggle your  toes.  Pupils are 1 mm bilaterally, equal, response to pain in all 4 extremities      Procedures   If procedures were preformed on this patient, they are listed below:  Procedures  The patient was seen, evaluated, and treated in conjunction with the attending physician, who voiced agreement in the care provided.  Note generated using Dragon voice dictation software and may contain dictation errors. Please contact me for any clarification or with any questions.   Electronically signed by:  Osvaldo Shipper, M.D. (PGY-2)    Gunnar Bulla, MD 02/23/23 [id number]    Alvira Monday, MD 02/24/23 [phone]

## 2023-02-23 NOTE — H&P (Addendum)
[facility] H&P NOTE   Date of service: February 23, 2023 Patient Name: DEARI WEMPLE MRN:  [id number] DOB:  24-Oct-1930 Chief Complaint: "Found down, bleed on CT head"  History of Present Illness  AYLYN TARAZONA is a 89 y.o. female  has a past medical history of Abdominal pain (06/26/2008), Back pain (06/27/2013), Bilateral impacted cerumen (10/21/2020), Disequilibrium (10/25/2021), Edema (08/14/2022), Essential hypertension (10/06/2007), Fibrocystic breast disease, Heart murmur (10/25/2021), Hereditary and idiopathic peripheral neuropathy (01/10/2016), Hyperglycemia (07/09/2014), Hyperlipidemia (10/01/2006), Insomnia (11/14/2012), Irritable bowel syndrome (09/10/2006), Lumbar back pain with radiculopathy affecting left lower extremity (11/13/2013), Metatarsal stress fracture (01/20/2016), Migraine headache (09/10/2006), Mild dementia (09/10/2022), MVP (mitral valve prolapse), Onychomycosis (07/09/2014), Osteopenia (11/12/2012), Palpitations (09/10/2006), Paresthesia of foot, bilateral (07/09/2014), Postmenopausal atrophic vaginitis (10/01/2006), Recurrent falls (01/30/2022), Renal insufficiency (08/14/2022), Vasomotor rhinitis (02/02/2008), and Vitamin D deficiency (07/14/2016).  Brought into the emergency room after found down by caregiver. Last known well 2 PM on 02/22/2023 when she was seen by family and dropped off at her home.  She has a history of mild dementia but is able to live by herself with family/caregiver checking on her.  This evening she was found down with incoherent speech and brought in for further evaluation.  Head CT in the emergency department revealed 3.3 cm left temporal intracerebral hemorrhage with small amount of overlying subarachnoid blood and IVH   Last known well: 2 p.m. on 02/22/2023 Modified rankin score: 2-Slight disability-UNABLE to perform all activities but does not need assistance ICH Score: 2 tNKASE: Not offered due to ICH Thrombectomy: not offered due to  ICH NIHSS components Score: Comment  1a Level of Conscious 0[x]  1[]  2[]  3[]      1b LOC Questions 0[]  1[]  2[x]       1c LOC Commands 0[]  1[x]  2[]       2 Best Gaze 0[x]  1[]  2[]       3 Visual 0[x]  1[]  2[]  3[]      4 Facial Palsy 0[x]  1[]  2[]  3[]      5a Motor Arm - left 0[x]  1[]  2[]  3[]  4[]  UN[]    5b Motor Arm - Right 0[x]  1[]  2[]  3[]  4[]  UN[]    6a Motor Leg - Left 0[x]  1[]  2[]  3[]  4[]  UN[]    6b Motor Leg - Right 0[x]  1[]  2[]  3[]  4[]  UN[]    7 Limb Ataxia 0[x]  1[]  2[]  3[]  UN[]     8 Sensory 0[x]  1[]  2[]  UN[]      9 Best Language 0[]  1[]  2[x]  3[]      10 Dysarthria 0[]  1[x]  2[]  UN[]      11 Extinct. and Inattention 0[x]  1[]  2[]       TOTAL: 6      ROS  Unable to perform due to aphasia  Past History   Past Medical History:  Diagnosis Date   Abdominal pain 06/26/2008   Back pain 06/27/2013   Neck and low back   Bilateral impacted cerumen 10/21/2020   Disequilibrium 10/25/2021   Edema 08/14/2022   Essential hypertension 10/06/2007   Fibrocystic breast disease    Heart murmur 10/25/2021   Hereditary and idiopathic peripheral neuropathy 01/10/2016   Hyperglycemia 07/09/2014   Hyperlipidemia 10/01/2006   Insomnia 11/14/2012   Irritable bowel syndrome 09/10/2006   Lumbar back pain with radiculopathy affecting left lower extremity 11/13/2013   Metatarsal stress fracture 01/20/2016   Migraine headache 09/10/2006   Mild dementia 09/10/2022   MVP (mitral valve prolapse)    Onychomycosis 07/09/2014   Osteopenia 11/12/2012   Palpitations 09/10/2006   Paresthesia of foot, bilateral  07/09/2014   Postmenopausal atrophic vaginitis 10/01/2006   Recurrent falls 01/30/2022   Renal insufficiency 08/14/2022   Vasomotor rhinitis 02/02/2008   Vitamin D deficiency 07/14/2016   Past Surgical History:  Procedure Laterality Date   DENTAL SURGERY  09/2014   ROTATOR CUFF REPAIR Right    TONSILLECTOMY     Family History  Problem Relation Age of Onset   Alzheimer's disease Mother    Dementia  Father        unspecified type; w/advanced age   Inflammatory bowel disease Sister    Alzheimer's disease Sister    Pancreatic cancer Maternal Grandfather        unknown details   Hyperlipidemia Son    Obesity Son    Hyperlipidemia Son    Heart disease Paternal Uncle    Coronary artery disease Neg Hx    Colon cancer Neg Hx    Stomach cancer Neg Hx    Social History   Socioeconomic History   Marital status: Widowed    Spouse name: Not on file   Number of children: 4   Years of education: 16   Highest education level: Bachelor's degree (e.g., BA, AB, BS)  Occupational History   Occupation: Retired    Associate Professor: RETIRED  Tobacco Use   Smoking status: Never   Smokeless tobacco: Never  Vaping Use   Vaping status: Never Used  Substance and Sexual Activity   Alcohol use: No   Drug use: No   Sexual activity: Never    Birth control/protection: None    Comment: regular exercise, lives at the [place], widowed in 2014  Other Topics Concern   Not on file  Social History Narrative   Left handed   Caffeine 3-4 glasses of tea   Lives alone and does her own medicine      Comes in with son stephen   Social Drivers of Health   Financial Resource Strain: Low Risk  (10/11/2020)   Overall Financial Resource Strain (CARDIA)    Difficulty of Paying Living Expenses: Not hard at all  Food Insecurity: No Food Insecurity (10/11/2020)   Hunger Vital Sign    Worried About Running Out of Food in the Last Year: Never true    Ran Out of Food in the Last Year: Never true  Transportation Needs: No Transportation Needs (10/11/2020)   PRAPARE - Administrator, Civil Service (Medical): No    Lack of Transportation (Non-Medical): No  Physical Activity: Insufficiently Active (10/11/2020)   Exercise Vital Sign    Days of Exercise per Week: 4 days    Minutes of Exercise per Session: 30 min  Stress: No Stress Concern Present (10/11/2020)   [employer] of Occupational Health -  Occupational Stress Questionnaire    Feeling of Stress : Not at all  Social Connections: Moderately Isolated (10/11/2020)   Social Connection and Isolation Panel [NHANES]    Frequency of Communication with Friends and Family: More than three times a week    Frequency of Social Gatherings with Friends and Family: More than three times a week    Attends Religious Services: More than 4 times per year    Active Member of [employer] or Organizations: No    Attends Banker Meetings: Never    Marital Status: Widowed   Allergies  Allergen Reactions   Codeine     REACTION: Nausea  Vomiting    Medications  (Not in a hospital admission)    Vitals   Vitals:  02/23/23 2030 02/23/23 2230 02/23/23 2245 02/23/23 2253  BP: (!) 178/94 (!) 185/111 (!) 149/85   Pulse: 98     Resp: 20 18 20    Temp:    98 F (36.7 C)  TempSrc:    Oral  SpO2: 97%        There is no height or weight on file to calculate BMI.  Physical Exam  General: Elderly lady no acute distress HEENT: Normocephalic, atraumatic, neck in c-collar Lungs: Clear Cardiovascular: Regular rate rhythm Abdomen nondistended nontender Extremities warm well-perfused Neurological exam She is awake alert, she is not able to tell me her name.  She is able to follow some simple commands such as raising her arms and legs but is not able to repeat.  She is able to make some sentences which are coherent and themselves but not related to the conversation that is being had.  Mild dysarthria. Cranial nerves II to XII appear intact Motor examination with no drift in any of the 4 extremities Sensation intact to noxious stimulation leading to strong withdrawal in all 4 Coordination difficult to assess given her mentation  Labs   CBC:  Recent Labs  Lab 02/23/23 1855 02/23/23 1903  WBC 16.4*  --   NEUTROABS 15.0*  --   HGB 15.1* 14.6  HCT 45.7 43.0  MCV 94.6  --   PLT 309  --     Basic Metabolic Panel:  Lab Results   Component Value Date   NA 137 02/23/2023   K 4.0 02/23/2023   CO2 20 (L) 02/23/2023   GLUCOSE 147 (H) 02/23/2023   BUN 32 (H) 02/23/2023   CREATININE 0.90 02/23/2023   CALCIUM 9.2 02/23/2023   GFRNONAA 50 (L) 02/23/2023   GFRAA 41 (L) 06/07/2018   Lipid Panel:  Lab Results  Component Value Date   LDLCALC 70 08/14/2022   HgbA1c:  Lab Results  Component Value Date   HGBA1C 6.3 08/14/2022   Urine Drug Screen:     Component Value Date/Time   LABOPIA NONE DETECTED 02/23/2023 2127   COCAINSCRNUR NONE DETECTED 02/23/2023 2127   LABBENZ NONE DETECTED 02/23/2023 2127   AMPHETMU NONE DETECTED 02/23/2023 2127   THCU NONE DETECTED 02/23/2023 2127   LABBARB NONE DETECTED 02/23/2023 2127    Alcohol Level     Component Value Date/Time   ETH <10 02/23/2023 1855   INR  Lab Results  Component Value Date   INR 1.0 11/19/2022   APTT  Lab Results  Component Value Date   APTT 26 11/19/2022   Urinalysis with no evidence of UTI  CT Head without contrast(Personally reviewed): About 23 cc ICH in the left temporal region along with mild overlying subarachnoid blood.  There is also bilateral intraventricular hemorrhage in the occipital horns of the lateral ventricles-which was not read or mentioned in the formal radiology report.  No evidence of hydrocephalus.  CT cervical spine-report reviewed-Motion limited study-no visible fracture or subluxation.  Advanced diffuse degenerative disc disease.  CT chest abdomen pelvis report reviewed-no significant traumatic injury or acute findings in the chest abdomen or pelvis.  Aortic atherosclerosis.  CT thoracic and lumbar spine with scoliosis, degenerative changes and no acute bony abnormality.  Impression   NYASHA WOOLF is a 89 y.o. female past medical history of hypertension, peripheral neuropathy, hyperlipidemia, back pain, palpitations, recurrent falls and vitamin D deficiency brought in for evaluation into the emergency department  after she was found down for the last known well greater than 24  hours from presentation.  CT head with a 23 cc ICH in the left temporal region along with mild overlying subarachnoid hemorrhage and bilateral IVH without any evidence of hydrocephalus. Review of prior MRIs raises concern for possible cerebral amyloid angiopathy which might be the etiology for this bleed versus hypertensive intracerebral hemorrhage versus ischemic infarction with hemorrhagic transformation. In either case, at this point she will need blood pressure management as mainstay of her treatment and further workup.  Primary Diagnosis:  Nontraumatic intracerebral hemorrhage involving the cortical regions in the left temporal lobe-hypertensive emergency versus CAA versus ischemic stroke with hemorrhagic transformation may be the etiology-further imaging will help Hypertensive emergency Intraventricular hemorrhage Small amount of subarachnoid hemorrhage-likely the extension of the ICH-trauma a possibility but less likely.  MRI imaging may help further tease this out.  Recommendations  Admit to neuro ICU-neurological ICU charge nurse made aware. Frequent checks Telemetry SCDs only-do not use any antiplatelets or anticoagulants even for DVT prophylaxis. Blood pressure goal systolic 130-150.  On Cleviprex at this time. 2D echo A1c Lipid panel N.p.o. until cleared by swallow evaluation at bedside or by formal SLP evaluation Check labs in the morning and replete electrolytes as necessary Urine with no evidence of UTI. Avoid hypoglycemia.  Keep sugars below 180. GFR is around baseline-3 months ago was 55.  Today is 50. Gentle hydration with fluids-normal saline 75 cc an hour Will avoid CT angiography given advanced age and somewhat diminished GFR.  Will order MRA head and carotid Dopplers She was brought in a c-collar as she was found down-C-spine imaging unremarkable.  Can remove c-collar Neurosurgery was consulted by the  ER-no neurosurgical intervention recommended I would recommend repeat head CT in about 6 hours and repeat imaging in the form of MRI 6 hours after that.  I have ordered repeat CT head at 5 AM and MRI brain without contrast and MRA head without contrast at 11 AM.   DVT prophylaxis: No antiplatelets or anticoagulants.  SCDs only. Bowel regimen: Docusate senna GI prophylaxis: PPI  Stroke team primary-will follow in the morning. ______________________________________________________________________   Dene Gentry, MD [facility]       Probably just manually Patient's son requested a call back at around 12:40 AM and requested that the CODE STATUS be changed back to full code.  He wanted to wait till situation is more clear as to what is going on with her before any CODE STATUS decisions as a DNR should be made. I did explain to him that in a patient who is 89 years old with a large brain hemorrhage, if her heart were to stop on its own, CPR usually is not extremely beneficial in terms of neurologically meaningful recovery.  He will keep that in mind. Bottom line- the patient will be made a full code at this time.  Please disregard the CODE STATUS above.  I will also strikethrough that CODE STATUS open the initial assessment and plan section to avoid any confusion and make sure that the order in the chart is updated    CRITICAL CARE ATTESTATION Performed by: Milon Dikes, MD Total critical care time: 45 minutes Critical care time was exclusive of separately billable procedures and treating other patients and/or supervising APPs/Residents/Students Critical care was necessary to treat or prevent imminent or life-threatening deterioration. This patient is critically ill and at significant risk for neurological worsening and/or death and care requires constant monitoring. Critical care was time spent personally by me on the following activities: development  of treatment  plan with patient and/or surrogate as well as nursing, discussions with consultants, evaluation of patient's response to treatment, examination of patient, obtaining history from patient or surrogate, ordering and performing treatments and interventions, ordering and review of laboratory studies, ordering and review of radiographic studies, pulse oximetry, re-evaluation of patient's condition, participation in multidisciplinary rounds and medical decision making of high complexity in the care of this patient.

## 2023-02-23 NOTE — ED Notes (Signed)
Per Dr. Wilford Corner c-spine clear to take c-collar off.

## 2023-02-23 NOTE — ED Triage Notes (Addendum)
Pt BIB [facility] for fall at home by caregiver. LKW yesterday at 2pm.  Abrasion on L Shoulder C-collar on.

## 2023-02-24 ENCOUNTER — Encounter (HOSPITAL_COMMUNITY): Payer: Medicare Other

## 2023-02-24 ENCOUNTER — Inpatient Hospital Stay (HOSPITAL_COMMUNITY): Payer: Medicare Other

## 2023-02-24 DIAGNOSIS — I618 Other nontraumatic intracerebral hemorrhage: Secondary | ICD-10-CM | POA: Diagnosis not present

## 2023-02-24 DIAGNOSIS — I639 Cerebral infarction, unspecified: Secondary | ICD-10-CM

## 2023-02-24 DIAGNOSIS — I10 Essential (primary) hypertension: Secondary | ICD-10-CM

## 2023-02-24 LAB — CBC
HCT: 41.3 % (ref 36.0–46.0)
Hemoglobin: 14.4 g/dL (ref 12.0–15.0)
MCH: 32.3 pg (ref 26.0–34.0)
MCHC: 34.9 g/dL (ref 30.0–36.0)
MCV: 92.6 fL (ref 80.0–100.0)
Platelets: 282 10*3/uL (ref 150–400)
RBC: 4.46 MIL/uL (ref 3.87–5.11)
RDW: 15 % (ref 11.5–15.5)
WBC: 12.9 10*3/uL — ABNORMAL HIGH (ref 4.0–10.5)
nRBC: 0 % (ref 0.0–0.2)

## 2023-02-24 LAB — ECHOCARDIOGRAM COMPLETE
AR max vel: 2.52 cm2
AV Area VTI: 2.71 cm2
AV Area mean vel: 2.46 cm2
AV Mean grad: 4 mm[Hg]
AV Peak grad: 7.4 mm[Hg]
Ao pk vel: 1.36 m/s
Area-P 1/2: 3.27 cm2
S' Lateral: 2.2 cm
Weight: 1915.36 [oz_av]

## 2023-02-24 LAB — LIPID PANEL
Cholesterol: 144 mg/dL (ref 0–200)
HDL: 73 mg/dL (ref >40)
LDL Cholesterol: 59 mg/dL (ref 0–99)
Total CHOL/HDL Ratio: 2 {ratio}
Triglycerides: 58 mg/dL (ref <150)
VLDL: 12 mg/dL (ref 0–40)

## 2023-02-24 LAB — MRSA NEXT GEN BY PCR, NASAL: MRSA by PCR Next Gen: NOT DETECTED

## 2023-02-24 LAB — HEMOGLOBIN A1C
Hgb A1c MFr Bld: 6.4 % — ABNORMAL HIGH (ref 4.8–5.6)
Mean Plasma Glucose: 137 mg/dL

## 2023-02-24 MED ORDER — SODIUM CHLORIDE 0.9 % IV BOLUS
1000.0000 mL | Freq: Once | INTRAVENOUS | Status: AC
  Administered 2023-02-24: 1000 mL via INTRAVENOUS

## 2023-02-24 MED ORDER — PERFLUTREN LIPID MICROSPHERE
1.0000 mL | INTRAVENOUS | Status: AC | PRN
  Administered 2023-02-24: 3 mL via INTRAVENOUS

## 2023-02-24 MED ORDER — SODIUM CHLORIDE 0.9 % IV BOLUS
500.0000 mL | Freq: Once | INTRAVENOUS | Status: AC
  Administered 2023-02-24: 500 mL via INTRAVENOUS

## 2023-02-24 MED ORDER — HYDRALAZINE HCL 20 MG/ML IJ SOLN
10.0000 mg | INTRAMUSCULAR | Status: DC | PRN
  Administered 2023-02-26: 10 mg via INTRAVENOUS
  Filled 2023-02-24 (×2): qty 1

## 2023-02-24 MED ORDER — DONEPEZIL HCL 10 MG PO TABS
10.0000 mg | ORAL_TABLET | Freq: Every day | ORAL | Status: DC
  Administered 2023-02-24 – 2023-03-05 (×10): 10 mg via ORAL
  Filled 2023-02-24 (×9): qty 1

## 2023-02-24 MED ORDER — GADOBUTROL 1 MMOL/ML IV SOLN
5.0000 mL | Freq: Once | INTRAVENOUS | Status: AC | PRN
  Administered 2023-02-24: 5 mL via INTRAVENOUS

## 2023-02-24 MED ORDER — ORAL CARE MOUTH RINSE: 15.0000 mL | OROMUCOSAL | Status: DC | PRN

## 2023-02-24 MED ORDER — LABETALOL HCL 5 MG/ML IV SOLN
10.0000 mg | INTRAVENOUS | Status: DC | PRN
  Administered 2023-02-25 – 2023-03-03 (×3): 10 mg via INTRAVENOUS
  Filled 2023-02-24 (×3): qty 4

## 2023-02-24 NOTE — Plan of Care (Signed)
  Problem: Education: Goal: Knowledge of disease or condition will improve Outcome: Not Progressing Goal: Knowledge of patient specific risk factors will improve [name] N/A or DELETE if not current risk factor) Outcome: Not Progressing   Problem: Intracerebral Hemorrhage Tissue Perfusion: Goal: Complications of Intracerebral Hemorrhage will be minimized Outcome: Not Progressing   Problem: Coping: Goal: Will verbalize positive feelings about self Outcome: Not Progressing   Problem: Self-Care: Goal: Ability to participate in self-care as condition permits will improve Outcome: Not Progressing

## 2023-02-24 NOTE — Progress Notes (Signed)
SLP Cancellation Note  Patient Details Name: Christine Henderson MRN: [id number] DOB: [birth year]   Cancelled treatment:       Reason Eval/Treat Not Completed: Patient at procedure or test/unavailable. Transport arrived to take pt to MRI. Will f/u as able.    Mahala Menghini., M.A. CCC-SLP Acute Rehabilitation Services Office [phone]  Secure chat preferred  02/24/2023, 9:38 AM

## 2023-02-24 NOTE — Evaluation (Signed)
Physical Therapy Evaluation Patient Details Name: Christine Henderson MRN: [id number] DOB: June 30, 1930 Today's Date: 02/24/2023  History of Present Illness  89 yo female presents to [facility] on 12/23 with fall at home. CTH reveals multifocal L hemisphere acute hemorrhagic contusions, largest is 3.5 cm L temporal with small amount of overlying subarachnoid blood and IVH. MRI punctate acute ischemic stroke in left frontal white matter.  PMH includes HLD, HTN, peripheral neuropathy, osteopenia, recurrent falls, disequilibrium, IBS, mild dementia.  Clinical Impression  Patient presents with decreased mobility due to decreased cognition, decreased balance with recent history of falls, decreased activity tolerance and generalized weakness.  Previously living alone with intermittent assistance throughout the week.  Currently patient needing mod to min A for mobility up to bathroom with RW with increased time and difficulty positioning to sit on toilet in bathroom.  Feel she will benefit from skilled PT in the acute setting and will need post-acute inpatient rehab (<3 hours/day) prior to d/c home.         If plan is discharge home, recommend the following: A little help with walking and/or transfers;Assistance with cooking/housework;A lot of help with bathing/dressing/bathroom;Assist for transportation;Direct supervision/assist for medications management;Supervision due to cognitive status;Help with stairs or ramp for entrance   Can travel by private vehicle   Yes    Equipment Recommendations None recommended by PT  Recommendations for Other Services       Functional Status Assessment Patient has had a recent decline in their functional status and demonstrates the ability to make significant improvements in function in a reasonable and predictable amount of time.     Precautions / Restrictions Precautions Precautions: Fall Precaution Comments: SBP< 160      Mobility  Bed Mobility Overal bed mobility:  Needs Assistance Bed Mobility: Supine to Sit     Supine to sit: Mod assist, HOB elevated     General bed mobility comments: assist for legs off the bed and to lift trunk; pt able to scoot some, but assisted with scooting as well; increased time and assist for feet on the floor (pt holding legs extended initially)    Transfers Overall transfer level: Needs assistance Equipment used: Rolling walker (2 wheels) Transfers: Sit to/from Stand Sit to Stand: Mod assist           General transfer comment: some lifting help to stand from EOB and increased time and max cues with assist for finding toilet in bathroom and sitting    Ambulation/Gait Ambulation/Gait assistance: Min assist Gait Distance (Feet): 10 Feet (x 2) Assistive device: Rolling walker (2 wheels) Gait Pattern/deviations: Step-to pattern, Decreased stride length, Trunk flexed       General Gait Details: to bathroom then back to bed, wide RW (that was in the room) so assist around door of bathroom and for turning in the room with RW as well as assist for balance  Stairs            Wheelchair Mobility     Tilt Bed    Modified Rankin (Stroke Patients Only) Modified Rankin (Stroke Patients Only) Pre-Morbid Rankin Score: Moderate disability Modified Rankin: Moderately severe disability     Balance Overall balance assessment: Needs assistance, History of Falls   Sitting balance-Leahy Scale: Fair     Standing balance support: Bilateral upper extremity supported Standing balance-Leahy Scale: Poor Standing balance comment: needing UE support in standing; son in the room reports more falls at home in past few months  Pertinent Vitals/Pain      Home Living Family/patient expects to be discharged to:: Private residence Living Arrangements: Alone Available Help at Discharge: Family;Available PRN/intermittently;Personal care attendant Type of Home: House  (townhome) Home Access: Level entry       Home Layout: One level Home Equipment: Cane - single Librarian, academic (2 wheels);Shower seat Additional Comments: has life alert but does not remember to push it when she falls    Prior Function Prior Level of Function : Needs assist;History of Falls (last six months)             Mobility Comments: independent with mobility with walker ADLs Comments: bathing help, help for meals, gets help from service 3 nights a week, friend comes 3 days a week at lunch and son comes 60 and Wednesday     Extremity/Trunk Assessment   Upper Extremity Assessment Upper Extremity Assessment: Generalized weakness;Left hand dominant    Lower Extremity Assessment Lower Extremity Assessment: Generalized weakness    Cervical / Trunk Assessment Cervical / Trunk Assessment: Kyphotic  Communication   Communication Communication: Difficulty communicating thoughts/reduced clarity of speech  Cognition Arousal: Alert Behavior During Therapy: WFL for tasks assessed/performed Overall Cognitive Status: Impaired/Different from baseline Area of Impairment: Orientation, Memory, Following commands, Attention, Safety/judgement, Problem solving                 Orientation Level: Disoriented to, Place, Time, Situation Current Attention Level: Sustained Memory: Decreased short-term memory Following Commands: Follows one step commands with increased time, Follows one step commands inconsistently Safety/Judgement: Decreased awareness of deficits, Decreased awareness of safety   Problem Solving: Slow processing General Comments: at times unable to complete sentences, did not believe she was in the hospital, unable to recall home situation        General Comments General comments (skin integrity, edema, etc.): VSS with mobility; son in the room; patient incontinent of stool walking to bathroom, noted to have prolapsed rectum    Exercises      Assessment/Plan    PT Assessment Patient needs continued PT services  PT Problem List Decreased balance;Decreased strength;Decreased cognition;Decreased mobility;Decreased activity tolerance;Decreased safety awareness;Decreased knowledge of use of DME;Decreased knowledge of precautions       PT Treatment Interventions DME instruction;Therapeutic activities;Cognitive remediation;Therapeutic exercise;Gait training;Balance training;Functional mobility training;Patient/family education    PT Goals (Current goals can be found in the Care Plan section)  Acute Rehab PT Goals Patient Stated Goal: family agreeable to rehab PT Goal Formulation: With patient/family Time For Goal Achievement: 03/10/23 Potential to Achieve Goals: Fair    Frequency Min 1X/week     Co-evaluation               AM-PAC PT "6 Clicks" Mobility  Outcome Measure Help needed turning from your back to your side while in a flat bed without using bedrails?: A Little Help needed moving from lying on your back to sitting on the side of a flat bed without using bedrails?: A Little Help needed moving to and from a bed to a chair (including a wheelchair)?: A Little Help needed standing up from a chair using your arms (e.g., wheelchair or bedside chair)?: A Little Help needed to walk in hospital room?: Total Help needed climbing 3-5 steps with a railing? : Total 6 Click Score: 14    End of Session Equipment Utilized During Treatment: Gait belt Activity Tolerance: Patient tolerated treatment well Patient left: in bed;with bed alarm set;with call bell/phone within reach;with family/visitor present   PT Visit  Diagnosis: Other abnormalities of gait and mobility (R26.89);Muscle weakness (generalized) (M62.81);Other symptoms and signs involving the nervous system (R29.898)    Time: 5366-4403 PT Time Calculation (min) (ACUTE ONLY): 31 min   Charges:   PT Evaluation $PT Eval Moderate Complexity: 1 Mod PT  Treatments $Gait Training: 8-22 mins PT General Charges $$ ACUTE PT VISIT: 1 Visit         Sheran Lawless, PT Acute Rehabilitation Services Office:[phone] 02/24/2023   Elray Mcgregor 02/24/2023, 5:50 PM

## 2023-02-24 NOTE — Progress Notes (Signed)
CTH completed and reviewed.  IMPRESSION: 1. Multifocal left hemisphere acute hemorrhagic contusions, the largest 3.5 cm in the posterior left temporal lobe, appear stable since last night. Frontal and temporal lobe involvement, and possible additional subcentimeter superior convexity contusions also, versus foci of SAH. 2. Stable small volume left hemisphere subarachnoid hemorrhage, layering small volume intraventricular hemorrhage. 3. No midline shift, acute ventriculomegaly, or other complicating features. 4. No skull fracture identified. Questionable mild right forehead scalp hematoma or contusion.   Plan as in H&P.  -- Milon Dikes, MD Neurologist [facility]  No charge note

## 2023-02-24 NOTE — Progress Notes (Signed)
  Echocardiogram 2D Echocardiogram has been performed.  Christine Henderson Alyssa Mancera 02/24/2023, 1:08 PM

## 2023-02-24 NOTE — Progress Notes (Signed)
OT Cancellation Note  Patient Details Name: Christine Henderson MRN: [id number] DOB: [birth year]   Cancelled Treatment:    Reason Eval/Treat Not Completed: Active bedrest order  Mateo Flow 02/24/2023, 1:11 PM

## 2023-02-24 NOTE — Progress Notes (Signed)
Transition of Care [facility]) - CAGE-AID Screening   Patient Details  Name: Christine Henderson MRN: [id number] Date of Birth: 02/13/1931  Transition of [facility]) CM/SW Contact:    Katha Hamming, RN Phone Number: 02/24/2023, 6:41 AM    CAGE-AID Screening:    Have You Ever Felt You Ought to Cut Down on Your Drinking or Drug Use?: No Have People Annoyed You By Critizing Your Drinking Or Drug Use?: No Have You Felt Bad Or Guilty About Your Drinking Or Drug Use?: No Have You Ever Had a Drink or Used Drugs First Thing In The Morning to Steady Your Nerves or to Get Rid of a Hangover?: No CAGE-AID Score: 0  Substance Abuse Education Offered: No

## 2023-02-24 NOTE — Progress Notes (Signed)
Carotid arterial duplex completed. Please see CV Procedures for preliminary results.  Shona Simpson, RVT 02/24/23 2:52 PM

## 2023-02-24 NOTE — Progress Notes (Addendum)
STROKE TEAM PROGRESS NOTE   BRIEF HPI [name] is a 89 y.o. female with history of hypertension, peripheral neuropathy, hyperlipidemia, IBS, migraines, mild dementia, mitral valve prolapse, chronic kidney disease, vitamin D deficiency and recurrent falls presented after being found down at home with incoherent speech.  Patient was noted to have a left temporal intracerebral hemorrhage on CT  NIH on Admission 6  ICH score 2  SIGNIFICANT HOSPITAL EVENTS 12/23-patient admitted with left temporal ICH  INTERIM HISTORY/SUBJECTIVE Patient has been hemodynamically stable with a brief episode of hypotension last night.  She is not requiring Cleviprex to maintain blood pressure within parameters.   OBJECTIVE  CBC    Component Value Date/Time   WBC 12.9 (H) 02/24/2023 0756   RBC 4.46 02/24/2023 0756   HGB 14.4 02/24/2023 0756   HCT 41.3 02/24/2023 0756   PLT 282 02/24/2023 0756   MCV 92.6 02/24/2023 0756   MCH 32.3 02/24/2023 0756   MCHC 34.9 02/24/2023 0756   RDW 15.0 02/24/2023 0756   LYMPHSABS 0.7 02/23/2023 1855   MONOABS 0.6 02/23/2023 1855   EOSABS 0.0 02/23/2023 1855   BASOSABS 0.0 02/23/2023 1855    BMET    Component Value Date/Time   NA 137 02/23/2023 1903   K 4.0 02/23/2023 1903   CL 105 02/23/2023 1903   CO2 20 (L) 02/23/2023 1855   GLUCOSE 147 (H) 02/23/2023 1903   BUN 32 (H) 02/23/2023 1903   CREATININE 0.90 02/23/2023 1903   CREATININE 0.81 06/27/2013 1148   CALCIUM 9.2 02/23/2023 1855   GFRNONAA 50 (L) 02/23/2023 1855    IMAGING past 24 hours MR ANGIO HEAD WO CONTRAST Result Date: 02/24/2023 CLINICAL DATA:  Stroke, hemorrhagic. Disequilibrium. Fall with intracranial hemorrhage. Multiple hemorrhagic contusions on the left. EXAM: MRI HEAD WITHOUT AND WITH CONTRAST MRA HEAD WITHOUT CONTRAST TECHNIQUE: Multiplanar, multi-echo pulse sequences of the brain and surrounding structures were acquired without and with intravenous contrast. Angiographic images  of the Circle of Willis were acquired using MRA technique without intravenous contrast. CONTRAST:  5mL GADAVIST GADOBUTROL 1 MMOL/ML IV SOLN COMPARISON:  CT studies earlier same day. FINDINGS: MRI HEAD FINDINGS Brain: Punctate focus of restricted diffusion in the left frontal white matter likely to represent an incidental acute small vessel infarction. No focal abnormality affects the brainstem or cerebellum. There is a background pattern of advanced chronic small-vessel ischemic change of the white matter, with innumerable punctate foci of hemosiderin deposition which could be due to chronic hypertensive small-vessel disease or amyloid angiopathy. Post traumatic intraparenchymal hematomas remain visible in the left temporal and frontal region with mild surrounding edema. Old hemorrhagic stroke seen in the medial right parietooccipital junction region. Small amount of blood layering dependently within the occipital horns of the lateral ventricles. No significant volume of subarachnoid blood is suggested by MRI. Ventricular size remains stable. No evidence of mass lesion. Very tiny amount of subdural blood along the lateral convexity on the left, 1 mm in thickness. No midline shift. Vascular: Major vessels at the base of the brain show flow. Skull and upper cervical spine: Negative Sinuses/Orbits: Clear/normal Other: None MRA HEAD FINDINGS Anterior circulation: Both internal carotid arteries are patent through the skull base and siphon regions. The anterior and middle cerebral vessels are patent. No large vessel occlusion or proximal stenosis. Distal vessels show atherosclerotic narrowing and irregularity. Posterior circulation: Both vertebral arteries patent to the basilar artery. No basilar stenosis. Posterior circulation branch vessels show flow. Some atherosclerotic irregularity of the more  distal PCA branch vessels. Anatomic variants: None significant. MRA HEAD FINDINGS IMPRESSION: 1. Punctate focus of  restricted diffusion in the left frontal white matter likely to represent an incidental acute small vessel infarction. 2. Post traumatic intraparenchymal hematomas remain visible in the left temporal and frontal region with mild surrounding edema. Small amount of blood layering dependently within the occipital horns of the lateral ventricles. No significant volume of subarachnoid blood is suggested by MRI. Ventricular size remains stable. 3. Very tiny amount of subdural blood along the lateral convexity on the left, 1 mm in thickness. 4. Old hemorrhagic stroke in the medial right parietooccipital junction region. 5. Background pattern of advanced chronic small-vessel ischemic change of the white matter, with innumerable punctate foci of hemosiderin deposition which could be due to chronic hypertensive small-vessel disease or amyloid angiopathy. 6. No large vessel occlusion or proximal stenosis. Distal vessels show atherosclerotic narrowing and irregularity. Electronically Signed   By: Paulina Fusi M.D.   On: 02/24/2023 11:42   MR BRAIN W WO CONTRAST Result Date: 02/24/2023 CLINICAL DATA:  Stroke, hemorrhagic. Disequilibrium. Fall with intracranial hemorrhage. Multiple hemorrhagic contusions on the left. EXAM: MRI HEAD WITHOUT AND WITH CONTRAST MRA HEAD WITHOUT CONTRAST TECHNIQUE: Multiplanar, multi-echo pulse sequences of the brain and surrounding structures were acquired without and with intravenous contrast. Angiographic images of the Circle of Willis were acquired using MRA technique without intravenous contrast. CONTRAST:  5mL GADAVIST GADOBUTROL 1 MMOL/ML IV SOLN COMPARISON:  CT studies earlier same day. FINDINGS: MRI HEAD FINDINGS Brain: Punctate focus of restricted diffusion in the left frontal white matter likely to represent an incidental acute small vessel infarction. No focal abnormality affects the brainstem or cerebellum. There is a background pattern of advanced chronic small-vessel ischemic change  of the white matter, with innumerable punctate foci of hemosiderin deposition which could be due to chronic hypertensive small-vessel disease or amyloid angiopathy. Post traumatic intraparenchymal hematomas remain visible in the left temporal and frontal region with mild surrounding edema. Old hemorrhagic stroke seen in the medial right parietooccipital junction region. Small amount of blood layering dependently within the occipital horns of the lateral ventricles. No significant volume of subarachnoid blood is suggested by MRI. Ventricular size remains stable. No evidence of mass lesion. Very tiny amount of subdural blood along the lateral convexity on the left, 1 mm in thickness. No midline shift. Vascular: Major vessels at the base of the brain show flow. Skull and upper cervical spine: Negative Sinuses/Orbits: Clear/normal Other: None MRA HEAD FINDINGS Anterior circulation: Both internal carotid arteries are patent through the skull base and siphon regions. The anterior and middle cerebral vessels are patent. No large vessel occlusion or proximal stenosis. Distal vessels show atherosclerotic narrowing and irregularity. Posterior circulation: Both vertebral arteries patent to the basilar artery. No basilar stenosis. Posterior circulation branch vessels show flow. Some atherosclerotic irregularity of the more distal PCA branch vessels. Anatomic variants: None significant. MRA HEAD FINDINGS IMPRESSION: 1. Punctate focus of restricted diffusion in the left frontal white matter likely to represent an incidental acute small vessel infarction. 2. Post traumatic intraparenchymal hematomas remain visible in the left temporal and frontal region with mild surrounding edema. Small amount of blood layering dependently within the occipital horns of the lateral ventricles. No significant volume of subarachnoid blood is suggested by MRI. Ventricular size remains stable. 3. Very tiny amount of subdural blood along the lateral  convexity on the left, 1 mm in thickness. 4. Old hemorrhagic stroke in the medial right parietooccipital junction region.  5. Background pattern of advanced chronic small-vessel ischemic change of the white matter, with innumerable punctate foci of hemosiderin deposition which could be due to chronic hypertensive small-vessel disease or amyloid angiopathy. 6. No large vessel occlusion or proximal stenosis. Distal vessels show atherosclerotic narrowing and irregularity. Electronically Signed   By: Paulina Fusi M.D.   On: 02/24/2023 11:42   CT HEAD WO CONTRAST Result Date: 02/24/2023 CLINICAL DATA:  89 year old female status post fall with intracranial hemorrhage. EXAM: CT HEAD WITHOUT CONTRAST TECHNIQUE: Contiguous axial images were obtained from the base of the skull through the vertex without intravenous contrast. RADIATION DOSE REDUCTION: This exam was performed according to the departmental dose-optimization program which includes automated exposure control, adjustment of the mA and/or kV according to patient size and/or use of iterative reconstruction technique. COMPARISON:  Head CT 02/23/2023.  Brain MRI 11/17/2021. FINDINGS: Brain: Hyperdense and round 13 mm anterior left inferior frontal gyrus hemorrhagic contusion (series 4, image 22) is stable and retrospect. Larger up to 3.5 cm posterior left temporal lobe probable hemorrhagic contusion is also stable. Superimposed multifocal smaller, up to 16 mm left inferior temporal gyrus hemorrhagic contusions are stable (including on coronal image 41). Small volume layering hemorrhage in both occipital horns is stable. Small volume left hemisphere subarachnoid hemorrhage is stable. Difficult to exclude small superimposed subcentimeter superior frontal and parietal cortical hemorrhagic contusions on sagittal image 41, but could be subarachnoid blood instead. No right hemisphere hemorrhagic contusion identified. No posterior fossa hemorrhage identified. And no  convincing subdural or epidural blood by CT. No midline shift. Stable ventricle size and configuration since last year, no acute ventriculomegaly. Stable gray-white matter differentiation throughout the brain. No cortically based acute infarct identified. Vascular: Some residual intravascular contrast is present. Skull: Stable and intact, no skull fracture identified. Partially visible advanced cervical spine degeneration. Sinuses/Orbits: Visualized paranasal sinuses and mastoids are stable and well aerated. Other: Questionable mild right forehead, supraorbital scalp hematoma or contusion on series 3, image 60. No scalp soft tissue gas, and otherwise negative orbit and scalp soft tissues. IMPRESSION: 1. Multifocal left hemisphere acute hemorrhagic contusions, the largest 3.5 cm in the posterior left temporal lobe, appear stable since last night. Frontal and temporal lobe involvement, and possible additional subcentimeter superior convexity contusions also, versus foci of SAH. 2. Stable small volume left hemisphere subarachnoid hemorrhage, layering small volume intraventricular hemorrhage. 3. No midline shift, acute ventriculomegaly, or other complicating features. 4. No skull fracture identified. Questionable mild right forehead scalp hematoma or contusion. Electronically Signed   By: Odessa Fleming M.D.   On: 02/24/2023 05:27   CT Head Wo Contrast Addendum Date: 02/23/2023 ADDENDUM REPORT: 02/23/2023 23:50 ADDENDUM: Additional finding of small amount of layering blood in the occipital horns of the lateral ventricles. Electronically Signed   By: Charlett Nose M.D.   On: 02/23/2023 23:50   Result Date: 02/23/2023 CLINICAL DATA:  Fall EXAM: CT HEAD WITHOUT CONTRAST TECHNIQUE: Contiguous axial images were obtained from the base of the skull through the vertex without intravenous contrast. RADIATION DOSE REDUCTION: This exam was performed according to the departmental dose-optimization program which includes automated  exposure control, adjustment of the mA and/or kV according to patient size and/or use of iterative reconstruction technique. COMPARISON:  11/19/2022 FINDINGS: Brain: There is a left temporal intracerebral hemorrhage measuring 3.3 x 3.0 cm. Small amount of overlying subarachnoid blood. No mass effect or midline shift. No hydrocephalus. Vascular: No hyperdense vessel or unexpected calcification. Skull: No acute calvarial abnormality. Sinuses/Orbits: No acute  findings Other: None IMPRESSION: 3.3 cm left temporal intracerebral hemorrhage with small amount of overlying subarachnoid blood. No mass effect or midline shift. Critical Value/emergent results were called by telephone at the time of interpretation on 02/23/2023 at 10:05 pm to provider [facility] , who verbally acknowledged these results. Electronically Signed: By: Charlett Nose M.D. On: 02/23/2023 22:07   CT Cervical Spine Wo Contrast Result Date: 02/23/2023 CLINICAL DATA:  Fall EXAM: CT CERVICAL SPINE WITHOUT CONTRAST TECHNIQUE: Multidetector CT imaging of the cervical spine was performed without intravenous contrast. Multiplanar CT image reconstructions were also generated. RADIATION DOSE REDUCTION: This exam was performed according to the departmental dose-optimization program which includes automated exposure control, adjustment of the mA and/or kV according to patient size and/or use of iterative reconstruction technique. COMPARISON:  None Available. FINDINGS: Alignment: Suboptimal study due to patient motion.  No subluxation. Skull base and vertebrae: No visible fracture. Study limited due to motion. Soft tissues and spinal canal: No prevertebral fluid or swelling. No visible canal hematoma. Disc levels:  Advanced diffuse degenerative disc and facet disease. Upper chest: No acute findings Other: Left temporal lobe intraparenchymal hemorrhage partially visualized. See head CT for further discussion. IMPRESSION: 1. Motion limited study. No visible  fracture or subluxation. 2. Advanced diffuse degenerative disc and facet disease. 3. Left temporal lobe intraparenchymal hemorrhage. See head CT for further discussion. Electronically Signed   By: Charlett Nose M.D.   On: 02/23/2023 22:09   CT T-SPINE NO CHARGE Result Date: 02/23/2023 CLINICAL DATA:  Fall EXAM: CT Thoracic and Lumbar spine with contrast TECHNIQUE: Multiplanar CT images of the thoracic and lumbar spine were reconstructed from contemporary CT of the Chest, Abdomen, and Pelvis. RADIATION DOSE REDUCTION: This exam was performed according to the departmental dose-optimization program which includes automated exposure control, adjustment of the mA and/or kV according to patient size and/or use of iterative reconstruction technique. CONTRAST:  No additional COMPARISON:  CT chest, abdomen and pelvis today FINDINGS: CT THORACIC SPINE FINDINGS Alignment: Mild convex leftward scoliosis in the upper thoracic spine and convex rightward scoliosis in the lower thoracic spine. No subluxation. Vertebrae: No fracture or focal bone lesion. Paraspinal and other soft tissues: Negative Disc levels: Diffuse degenerative disc disease with anterior spurring. CT LUMBAR SPINE FINDINGS Segmentation: 5 lumbar type vertebrae. Alignment: Moderate convex leftward scoliosis centered in the mid lumbar spine. 3 mm of anterolisthesis of L5 on S1. Vertebrae: No acute fracture or focal pathologic process. Paraspinal and other soft tissues: Negative Disc levels: Diffuse degenerative disc disease and facet disease. IMPRESSION: Scoliosis, degenerative changes. No acute bony abnormality. Electronically Signed   By: Charlett Nose M.D.   On: 02/23/2023 21:30   CT L-SPINE NO CHARGE Result Date: 02/23/2023 CLINICAL DATA:  Fall EXAM: CT Thoracic and Lumbar spine with contrast TECHNIQUE: Multiplanar CT images of the thoracic and lumbar spine were reconstructed from contemporary CT of the Chest, Abdomen, and Pelvis. RADIATION DOSE REDUCTION:  This exam was performed according to the departmental dose-optimization program which includes automated exposure control, adjustment of the mA and/or kV according to patient size and/or use of iterative reconstruction technique. CONTRAST:  No additional COMPARISON:  CT chest, abdomen and pelvis today FINDINGS: CT THORACIC SPINE FINDINGS Alignment: Mild convex leftward scoliosis in the upper thoracic spine and convex rightward scoliosis in the lower thoracic spine. No subluxation. Vertebrae: No fracture or focal bone lesion. Paraspinal and other soft tissues: Negative Disc levels: Diffuse degenerative disc disease with anterior spurring. CT LUMBAR SPINE FINDINGS Segmentation:  5 lumbar type vertebrae. Alignment: Moderate convex leftward scoliosis centered in the mid lumbar spine. 3 mm of anterolisthesis of L5 on S1. Vertebrae: No acute fracture or focal pathologic process. Paraspinal and other soft tissues: Negative Disc levels: Diffuse degenerative disc disease and facet disease. IMPRESSION: Scoliosis, degenerative changes. No acute bony abnormality. Electronically Signed   By: Charlett Nose M.D.   On: 02/23/2023 21:30   CT CHEST ABDOMEN PELVIS W CONTRAST Result Date: 02/23/2023 CLINICAL DATA:  Fall. EXAM: CT CHEST, ABDOMEN, AND PELVIS WITH CONTRAST TECHNIQUE: Multidetector CT imaging of the chest, abdomen and pelvis was performed following the standard protocol during bolus administration of intravenous contrast. RADIATION DOSE REDUCTION: This exam was performed according to the departmental dose-optimization program which includes automated exposure control, adjustment of the mA and/or kV according to patient size and/or use of iterative reconstruction technique. CONTRAST:  75mL OMNIPAQUE IOHEXOL 350 MG/ML SOLN COMPARISON:  05/29/2022 FINDINGS: CT CHEST FINDINGS Cardiovascular: Heart normal size. Ascending thoracic aorta upper limits normal in diameter at 3.9 cm. Aortic atherosclerosis. Mediastinum/Nodes: No  mediastinal, hilar, or axillary adenopathy. Trachea and esophagus are unremarkable. Thyroid unremarkable. Lungs/Pleura: Linear scarring in the lung bases. No acute confluent opacities, effusions or pneumothorax. Musculoskeletal: Severe pectus deformity of the sternum. No acute bony abnormality. Chest wall soft tissues are unremarkable. CT ABDOMEN PELVIS FINDINGS Hepatobiliary: No hepatic injury or perihepatic hematoma. Gallbladder is unremarkable. Pancreas: No focal abnormality or ductal dilatation. Spleen: No splenic injury or perisplenic hematoma. Adrenals/Urinary Tract: No adrenal hemorrhage or renal injury identified. Bladder is unremarkable. Stomach/Bowel: Stomach, large and small bowel grossly unremarkable. Vascular/Lymphatic: Aortoiliac atherosclerosis. No evidence of aneurysm or adenopathy. Reproductive: Uterus and adnexa unremarkable.  No mass. Other: No free fluid or free air. Musculoskeletal: No acute bony abnormality. Convex leftward scoliosis in the lumbar spine. Degenerative disc and facet disease. No acute bony abnormality. IMPRESSION: 1. No significant traumatic injury or acute findings in the chest, abdomen or pelvis. 2. Aortic atherosclerosis. Electronically Signed   By: Charlett Nose M.D.   On: 02/23/2023 21:27    Vitals:   02/24/23 1100 02/24/23 1109 02/24/23 1130 02/24/23 1200  BP: (!) 155/83 (!) 154/93 (!) 154/88 (!) 134/101  Pulse: 69 83 81 82  Resp:  17 (!) 23 17  Temp:    98.6 F (37 C)  TempSrc:    Axillary  SpO2: 94% 97% 98% 99%  Weight:         PHYSICAL EXAM General:  Alert, well-nourished, well-developed patient in no acute distress Psych:  Mood and affect appropriate for situation CV: Regular rate and rhythm on monitor Respiratory:  Regular, unlabored respirations on room air  NEURO:  Mental Status: Oriented to person, disoriented to place time and situation.   Speech/Language: speech is with mild expressive aphasia.  Repetition impaired, able to name 2 out of 3  objects  Cranial Nerves:  II: PERRL.  III, IV, VI: EOMI. Eyelids elevate symmetrically.  V: Sensation is intact to light touch and diminished on the right VII: Face is symmetrical resting and smiling VIII: hearing intact to voice. IX, X: Phonation is normal.  AO:[id number] shrug 5/5. XII: tongue is midline without fasciculations. Motor: Will to move all 4 extremities with good antigravity strength Tone: is normal and bulk is normal Sensation- Intact to light touch bilaterally but diminished on the right Coordination: FTN intact bilaterally Gait- deferred  Most Recent NIH 7  ASSESSMENT/PLAN  ICH: multifocal ICH at left temporal, frontal and frontoparietal convexity, etiology traumatic vs. CAA CT  head 3.3 cm left temporal ICH with small amount of overlying subarachnoid blood Repeat Head CT 12/24 multifocal left hemisphere acute hemorrhagic contusions, stable small volume left subarachnoid hemorrhage, small volume layering intraventricular hemorrhage with no midline shift or signs of hydrocephalus MRI posttraumatic IPH in left temporal and frontal region with mild surrounding edema and small amount of IVH, no significant volume of subarachnoid blood, 1 mm thick subdural blood along left lateral convexity MRA no LVO or proximal stenosis Carotid Doppler unremarkable 2D Echo EF 65-70% LDL 59 HgbA1c pending UDS neg VTE prophylaxis -SCDs No antithrombotic prior to admission, now on No antithrombotic due to ICH and CAA Therapy recommendations:  SNF Disposition: pending  Acute ischemic stroke, incidental finding - left frontal white matter punctate stroke MRI showed punctate acute ischemic stroke in left frontal white matter Likely from small vessel disease, ischemic WM changes and CAA No antithrombotics due to ICH and CAA  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 MRI showed old hemorrhagic stroke in medial right parieto-occipital and right PCA region, chronic small vessel ischemic changes and innumerable punctate  foci of hemosiderin deposition likely represent CAA No antithrombotics  Hypertension Home meds: Losartan 25 mg daily Stable As needed labetalol and hydralazine Long term BP goal < 140 given CAA  Hyperlipidemia Home meds: Simvastatin 10 mg daily LDL 59, goal < 70 Resume statin at discharge Not SATURN candidate given possible traumatic ICH  Other Stroke Risk Factors Advanced age Migraine   Other Active Problems Mild dementia - resume home Aricept Leukocytosis WBC 16.4->12.9  Hospital day # 1  Patient seen by NP and then by MD, MD to edit note as needed. Cortney E Ernestina Columbia , MSN, AGACNP-BC [facility] See Amion for schedule and pager information 02/24/2023 1:15 PM  ATTENDING NOTE: I reviewed above note and agree with the assessment and plan. Pt was seen and examined.   Son is at the bedside. Pt reclining in bed for lunch. Per son, pt still has some difficulty with language. She lives at home with care giver support 2-3 hours per day, but can do other ADL by herself. Son noticed some decline for the last 2-3 months. She was found down at home by care giver this time.   On exam, she is awake, alert, eyes open, orientated to people only but not to age, place, or time. Able to say "I don't know" "he is my son" but not other full sentences, can able to say short words. Able to follow most simple commands but not all. Able to name 2/3 but did not repeat. Psychomotor slowing. No gaze palsy, tracking bilaterally, blinking to visual threat bilaterally. No facial droop. Tongue protrusion not quite cooperative. Bilateral UEs 4/5, no drift. Bilaterally LEs 3/5, no drift. Sensation and coordination not quite cooperative, gait not tested.   Pt ICH on CT repeat more clear cut of at least 3 ICHs at left temporal, frontal and high convexity, concerning for traumatic ICH. MRI also showed CAA pattern, so CAA caused ICH also in DDx. Continue BP control, hold off statin for now and avoid  antithrombotics. PT and OT recommend SNF. I had long discussion with son at bedside, updated pt current condition, treatment plan and potential prognosis, and answered all the questions. He expressed understanding and appreciation.   For detailed assessment and plan, please refer to above/below as I have made changes wherever appropriate.   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 This patient's care requires constant monitoring of vital signs, hemodynamics, respiratory and cardiac monitoring, review of multiple databases, neurological assessment, discussion with family, other specialists and medical decision making of high complexity. I spent 40 minutes of neurocritical care time in the care of this patient.      To contact Stroke Continuity provider, please refer to [email or web address]. After hours, contact General Neurology

## 2023-02-24 NOTE — Progress Notes (Signed)
PT Cancellation Note  Patient Details Name: Christine Henderson MRN: [id number] DOB: May 07, 1930   Cancelled Treatment:    Reason Eval/Treat Not Completed: Active bedrest order - bedrest orders placed, and pt off unit for MRI. PT to check back as able.  Marye Round, PT DPT Acute Rehabilitation Services Secure Chat Preferred  Office [phone]    Truddie Coco 02/24/2023, 10:44 AM

## 2023-02-24 NOTE — Consult Note (Addendum)
Reason for Consult: Cerebral contusion Referring Physician: Dr. Corwin Levins is an 89 y.o. female.  HPI: Patient is 89 year old left-handed individual who was found down.  She apparently has an altered mental status with aphasia at this time and the CT scan reveals the presence of a subfrontal contusion and also a posterior temporal contusion on the left side.  She had been living independently however at this time she is aphasic.  Patient has significant cerebral atrophy and has no significant shift or mass effect from these lesions.  Past Medical History:  Diagnosis Date   Abdominal pain 06/26/2008   Back pain 06/27/2013   Neck and low back   Bilateral impacted cerumen 10/21/2020   Disequilibrium 10/25/2021   Edema 08/14/2022   Essential hypertension 10/06/2007   Fibrocystic breast disease    Heart murmur 10/25/2021   Hereditary and idiopathic peripheral neuropathy 01/10/2016   Hyperglycemia 07/09/2014   Hyperlipidemia 10/01/2006   Insomnia 11/14/2012   Irritable bowel syndrome 09/10/2006   Lumbar back pain with radiculopathy affecting left lower extremity 11/13/2013   Metatarsal stress fracture 01/20/2016   Migraine headache 09/10/2006   Mild dementia 09/10/2022   MVP (mitral valve prolapse)    Onychomycosis 07/09/2014   Osteopenia 11/12/2012   Palpitations 09/10/2006   Paresthesia of foot, bilateral 07/09/2014   Postmenopausal atrophic vaginitis 10/01/2006   Recurrent falls 01/30/2022   Renal insufficiency 08/14/2022   Vasomotor rhinitis 02/02/2008   Vitamin D deficiency 07/14/2016    Past Surgical History:  Procedure Laterality Date   DENTAL SURGERY  09/2014   ROTATOR CUFF REPAIR Right    TONSILLECTOMY      Family History  Problem Relation Age of Onset   Alzheimer's disease Mother    Dementia Father        unspecified type; w/advanced age   Inflammatory bowel disease Sister    Alzheimer's disease Sister    Pancreatic cancer Maternal Grandfather         unknown details   Hyperlipidemia Son    Obesity Son    Hyperlipidemia Son    Heart disease Paternal Uncle    Coronary artery disease Neg Hx    Colon cancer Neg Hx    Stomach cancer Neg Hx     Social History:  reports that she has never smoked. She has never used smokeless tobacco. She reports that she does not drink alcohol and does not use drugs.  Allergies:  Allergies  Allergen Reactions   Codeine Nausea And Vomiting    Medications: I have not reviewed the patient's medications but she is not on anticoagulants.  Results for orders placed or performed during the hospital encounter of 02/23/23 (from the past 48 hours)  CBC with Differential     Status: Abnormal   Collection Time: 02/23/23  6:55 PM  Result Value Ref Range   WBC 16.4 (H) 4.0 - 10.5 K/uL   RBC 4.83 3.87 - 5.11 MIL/uL   Hemoglobin 15.1 (H) 12.0 - 15.0 g/dL   HCT 72.5 36.6 - 44.0 %   MCV 94.6 80.0 - 100.0 fL   MCH 31.3 26.0 - 34.0 pg   MCHC 33.0 30.0 - 36.0 g/dL   RDW 34.7 42.5 - 95.6 %   Platelets 309 150 - 400 K/uL   nRBC 0.0 0.0 - 0.2 %   Neutrophils Relative % 91 %   Neutro Abs 15.0 (H) 1.7 - 7.7 K/uL   Lymphocytes Relative 4 %   Lymphs Abs 0.7 0.7 -  4.0 K/uL   Monocytes Relative 4 %   Monocytes Absolute 0.6 0.1 - 1.0 K/uL   Eosinophils Relative 0 %   Eosinophils Absolute 0.0 0.0 - 0.5 K/uL   Basophils Relative 0 %   Basophils Absolute 0.0 0.0 - 0.1 K/uL   Immature Granulocytes 1 %   Abs Immature Granulocytes 0.10 (H) 0.00 - 0.07 K/uL    Comment: Performed at [facility], [address], [place], [place] [address]  Comprehensive metabolic panel     Status: Abnormal   Collection Time: 02/23/23  6:55 PM  Result Value Ref Range   Sodium 135 135 - 145 mmol/L   Potassium 4.0 3.5 - 5.1 mmol/L   Chloride 103 98 - 111 mmol/L   CO2 20 (L) 22 - 32 mmol/L   Glucose, Bld 157 (H) 70 - 99 mg/dL    Comment: Glucose reference range applies only to samples taken after fasting for at least 8 hours.    BUN 29 (H) 8 - 23 mg/dL   Creatinine, Ser 0.10 (H) 0.44 - 1.00 mg/dL   Calcium 9.2 8.9 - 27.2 mg/dL   Total Protein 7.4 6.5 - 8.1 g/dL   Albumin 3.8 3.5 - 5.0 g/dL   AST 68 (H) 15 - 41 U/L   ALT 41 0 - 44 U/L   Alkaline Phosphatase 65 38 - 126 U/L   Total Bilirubin 2.7 (H) <1.2 mg/dL   GFR, Estimated 50 (L) >60 mL/min    Comment: (NOTE) Calculated using the CKD-EPI Creatinine Equation (2021)    Anion gap 12 5 - 15    Comment: Performed at [facility], [address], [place], [place] [address]  CK     Status: Abnormal   Collection Time: 02/23/23  6:55 PM  Result Value Ref Range   Total CK 1,152 (H) 38 - 234 U/L    Comment: Performed at [facility], [address] [place], [place] [address]  Ethanol     Status: None   Collection Time: 02/23/23  6:55 PM  Result Value Ref Range   Alcohol, Ethyl (B) <10 <10 mg/dL    Comment: (NOTE) Lowest detectable limit for serum alcohol is 10 mg/dL.  For medical purposes only. Performed at [facility], [address] [place], [place] [address]   Salicylate level     Status: Abnormal   Collection Time: 02/23/23  6:55 PM  Result Value Ref Range   Salicylate Lvl <7.0 (L) 7.0 - 30.0 mg/dL    Comment: Performed at [facility], [address], [place], [place] [address]  Acetaminophen level     Status: Abnormal   Collection Time: 02/23/23  6:55 PM  Result Value Ref Range   Acetaminophen (Tylenol), Serum <10 (L) 10 - 30 ug/mL    Comment: (NOTE) Therapeutic concentrations vary significantly. A range of 10-30 ug/mL  may be an effective concentration for many patients. However, some  are best treated at concentrations outside of this range. Acetaminophen concentrations >150 ug/mL at 4 hours after ingestion  and >50 ug/mL at 12 hours after ingestion are often associated with  toxic reactions.  Performed at [facility], [address] [place], [place] [address]   I-stat chem 8, ED (not at [facility], [facility] or  [facility])     Status: Abnormal   Collection Time: 02/23/23  7:03 PM  Result Value Ref Range   Sodium 137 135 - 145 mmol/L   Potassium  4.0 3.5 - 5.1 mmol/L   Chloride 105 98 - 111 mmol/L   BUN 32 (H) 8 - 23 mg/dL   Creatinine, Ser 0.34 0.44 - 1.00 mg/dL   Glucose, Bld 742 (H) 70 - 99 mg/dL    Comment: Glucose reference range applies only to samples taken after fasting for at least 8 hours.   Calcium, Ion 1.03 (L) 1.15 - 1.40 mmol/L   TCO2 20 (L) 22 - 32 mmol/L   Hemoglobin 14.6 12.0 - 15.0 g/dL   HCT 59.5 63.8 - 75.6 %  POC CBG, ED     Status: Abnormal   Collection Time: 02/23/23  7:09 PM  Result Value Ref Range   Glucose-Capillary 139 (H) 70 - 99 mg/dL    Comment: Glucose reference range applies only to samples taken after fasting for at least 8 hours.  Urinalysis, w/ Reflex to Culture (Infection Suspected) -Urine, Clean Catch     Status: Abnormal   Collection Time: 02/23/23  9:27 PM  Result Value Ref Range   Specimen Source URINE, CLEAN CATCH    Color, Urine YELLOW YELLOW   APPearance CLEAR CLEAR   Specific Gravity, Urine 1.029 1.005 - 1.030   pH 6.0 5.0 - 8.0   Glucose, UA 50 (A) NEGATIVE mg/dL   Hgb urine dipstick SMALL (A) NEGATIVE   Bilirubin Urine NEGATIVE NEGATIVE   Ketones, ur 20 (A) NEGATIVE mg/dL   Protein, ur 30 (A) NEGATIVE mg/dL   Nitrite NEGATIVE NEGATIVE   Leukocytes,Ua NEGATIVE NEGATIVE   RBC / HPF 0-5 0 - 5 RBC/hpf   WBC, UA 0-5 0 - 5 WBC/hpf    Comment:        Reflex urine culture not performed if WBC <=10, OR if Squamous epithelial cells >5. If Squamous epithelial cells >5 suggest recollection.    Bacteria, UA NONE SEEN NONE SEEN   Squamous Epithelial / HPF 0-5 0 - 5 /HPF    Comment: Performed at [facility], [address], [place], [place] [address]  Rapid urine drug screen (hospital performed)     Status: None   Collection Time: 02/23/23  9:27 PM  Result Value Ref Range   Opiates NONE DETECTED NONE DETECTED   Cocaine NONE DETECTED NONE  DETECTED   Benzodiazepines NONE DETECTED NONE DETECTED   Amphetamines NONE DETECTED NONE DETECTED   Tetrahydrocannabinol NONE DETECTED NONE DETECTED   Barbiturates NONE DETECTED NONE DETECTED    Comment: (NOTE) DRUG SCREEN FOR MEDICAL PURPOSES ONLY.  IF CONFIRMATION IS NEEDED FOR ANY PURPOSE, NOTIFY LAB WITHIN 5 DAYS.  LOWEST DETECTABLE LIMITS FOR URINE DRUG SCREEN Drug Class                     Cutoff (ng/mL) Amphetamine and metabolites    1000 Barbiturate and metabolites    200 Benzodiazepine                 200 Opiates and metabolites        300 Cocaine and metabolites        300 THC                            50 Performed at [facility], [address] [place], [place] [address]   MRSA Next Gen by PCR, Nasal     Status: None   Collection Time: 02/23/23 11:37 PM   Specimen: Nasal Mucosa; Nasal Swab  Result Value Ref  Range   MRSA by PCR Next Gen NOT DETECTED NOT DETECTED    Comment: (NOTE) The GeneXpert MRSA Assay (FDA approved for NASAL specimens only), is one component of a comprehensive MRSA colonization surveillance program. It is not intended to diagnose MRSA infection nor to guide or monitor treatment for MRSA infections. Test performance is not FDA approved in patients less than 85 years old. Performed at [facility], [address], [place], [place] [address]   CBC     Status: Abnormal   Collection Time: 02/24/23  7:56 AM  Result Value Ref Range   WBC 12.9 (H) 4.0 - 10.5 K/uL   RBC 4.46 3.87 - 5.11 MIL/uL   Hemoglobin 14.4 12.0 - 15.0 g/dL   HCT 56.2 13.0 - 86.5 %   MCV 92.6 80.0 - 100.0 fL   MCH 32.3 26.0 - 34.0 pg   MCHC 34.9 30.0 - 36.0 g/dL   RDW 78.4 69.6 - 29.5 %   Platelets 282 150 - 400 K/uL   nRBC 0.0 0.0 - 0.2 %    Comment: Performed at [facility], [address], [place], [place] [address]    CT HEAD WO CONTRAST Result Date: 02/24/2023 CLINICAL DATA:  89 year old female status post fall with intracranial hemorrhage.  EXAM: CT HEAD WITHOUT CONTRAST TECHNIQUE: Contiguous axial images were obtained from the base of the skull through the vertex without intravenous contrast. RADIATION DOSE REDUCTION: This exam was performed according to the departmental dose-optimization program which includes automated exposure control, adjustment of the mA and/or kV according to patient size and/or use of iterative reconstruction technique. COMPARISON:  Head CT 02/23/2023.  Brain MRI 11/17/2021. FINDINGS: Brain: Hyperdense and round 13 mm anterior left inferior frontal gyrus hemorrhagic contusion (series 4, image 22) is stable and retrospect. Larger up to 3.5 cm posterior left temporal lobe probable hemorrhagic contusion is also stable. Superimposed multifocal smaller, up to 16 mm left inferior temporal gyrus hemorrhagic contusions are stable (including on coronal image 41). Small volume layering hemorrhage in both occipital horns is stable. Small volume left hemisphere subarachnoid hemorrhage is stable. Difficult to exclude small superimposed subcentimeter superior frontal and parietal cortical hemorrhagic contusions on sagittal image 41, but could be subarachnoid blood instead. No right hemisphere hemorrhagic contusion identified. No posterior fossa hemorrhage identified. And no convincing subdural or epidural blood by CT. No midline shift. Stable ventricle size and configuration since last year, no acute ventriculomegaly. Stable gray-white matter differentiation throughout the brain. No cortically based acute infarct identified. Vascular: Some residual intravascular contrast is present. Skull: Stable and intact, no skull fracture identified. Partially visible advanced cervical spine degeneration. Sinuses/Orbits: Visualized paranasal sinuses and mastoids are stable and well aerated. Other: Questionable mild right forehead, supraorbital scalp hematoma or contusion on series 3, image 60. No scalp soft tissue gas, and otherwise negative orbit and  scalp soft tissues. IMPRESSION: 1. Multifocal left hemisphere acute hemorrhagic contusions, the largest 3.5 cm in the posterior left temporal lobe, appear stable since last night. Frontal and temporal lobe involvement, and possible additional subcentimeter superior convexity contusions also, versus foci of SAH. 2. Stable small volume left hemisphere subarachnoid hemorrhage, layering small volume intraventricular hemorrhage. 3. No midline shift, acute ventriculomegaly, or other complicating features. 4. No skull fracture identified. Questionable mild right forehead scalp hematoma or contusion. Electronically Signed   By: Odessa Fleming M.D.   On: 02/24/2023 05:27   CT Head Wo Contrast Addendum Date: 02/23/2023 ADDENDUM REPORT: 02/23/2023 23:50 ADDENDUM: Additional finding  of small amount of layering blood in the occipital horns of the lateral ventricles. Electronically Signed   By: Charlett Nose M.D.   On: 02/23/2023 23:50   Result Date: 02/23/2023 CLINICAL DATA:  Fall EXAM: CT HEAD WITHOUT CONTRAST TECHNIQUE: Contiguous axial images were obtained from the base of the skull through the vertex without intravenous contrast. RADIATION DOSE REDUCTION: This exam was performed according to the departmental dose-optimization program which includes automated exposure control, adjustment of the mA and/or kV according to patient size and/or use of iterative reconstruction technique. COMPARISON:  11/19/2022 FINDINGS: Brain: There is a left temporal intracerebral hemorrhage measuring 3.3 x 3.0 cm. Small amount of overlying subarachnoid blood. No mass effect or midline shift. No hydrocephalus. Vascular: No hyperdense vessel or unexpected calcification. Skull: No acute calvarial abnormality. Sinuses/Orbits: No acute findings Other: None IMPRESSION: 3.3 cm left temporal intracerebral hemorrhage with small amount of overlying subarachnoid blood. No mass effect or midline shift. Critical Value/emergent results were called by telephone  at the time of interpretation on 02/23/2023 at 10:05 pm to provider [facility] , who verbally acknowledged these results. Electronically Signed: By: Charlett Nose M.D. On: 02/23/2023 22:07   CT Cervical Spine Wo Contrast Result Date: 02/23/2023 CLINICAL DATA:  Fall EXAM: CT CERVICAL SPINE WITHOUT CONTRAST TECHNIQUE: Multidetector CT imaging of the cervical spine was performed without intravenous contrast. Multiplanar CT image reconstructions were also generated. RADIATION DOSE REDUCTION: This exam was performed according to the departmental dose-optimization program which includes automated exposure control, adjustment of the mA and/or kV according to patient size and/or use of iterative reconstruction technique. COMPARISON:  None Available. FINDINGS: Alignment: Suboptimal study due to patient motion.  No subluxation. Skull base and vertebrae: No visible fracture. Study limited due to motion. Soft tissues and spinal canal: No prevertebral fluid or swelling. No visible canal hematoma. Disc levels:  Advanced diffuse degenerative disc and facet disease. Upper chest: No acute findings Other: Left temporal lobe intraparenchymal hemorrhage partially visualized. See head CT for further discussion. IMPRESSION: 1. Motion limited study. No visible fracture or subluxation. 2. Advanced diffuse degenerative disc and facet disease. 3. Left temporal lobe intraparenchymal hemorrhage. See head CT for further discussion. Electronically Signed   By: Charlett Nose M.D.   On: 02/23/2023 22:09   CT T-SPINE NO CHARGE Result Date: 02/23/2023 CLINICAL DATA:  Fall EXAM: CT Thoracic and Lumbar spine with contrast TECHNIQUE: Multiplanar CT images of the thoracic and lumbar spine were reconstructed from contemporary CT of the Chest, Abdomen, and Pelvis. RADIATION DOSE REDUCTION: This exam was performed according to the departmental dose-optimization program which includes automated exposure control, adjustment of the mA and/or kV  according to patient size and/or use of iterative reconstruction technique. CONTRAST:  No additional COMPARISON:  CT chest, abdomen and pelvis today FINDINGS: CT THORACIC SPINE FINDINGS Alignment: Mild convex leftward scoliosis in the upper thoracic spine and convex rightward scoliosis in the lower thoracic spine. No subluxation. Vertebrae: No fracture or focal bone lesion. Paraspinal and other soft tissues: Negative Disc levels: Diffuse degenerative disc disease with anterior spurring. CT LUMBAR SPINE FINDINGS Segmentation: 5 lumbar type vertebrae. Alignment: Moderate convex leftward scoliosis centered in the mid lumbar spine. 3 mm of anterolisthesis of L5 on S1. Vertebrae: No acute fracture or focal pathologic process. Paraspinal and other soft tissues: Negative Disc levels: Diffuse degenerative disc disease and facet disease. IMPRESSION: Scoliosis, degenerative changes. No acute bony abnormality. Electronically Signed   By: Charlett Nose M.D.   On: 02/23/2023 21:30  CT L-SPINE NO CHARGE Result Date: 02/23/2023 CLINICAL DATA:  Fall EXAM: CT Thoracic and Lumbar spine with contrast TECHNIQUE: Multiplanar CT images of the thoracic and lumbar spine were reconstructed from contemporary CT of the Chest, Abdomen, and Pelvis. RADIATION DOSE REDUCTION: This exam was performed according to the departmental dose-optimization program which includes automated exposure control, adjustment of the mA and/or kV according to patient size and/or use of iterative reconstruction technique. CONTRAST:  No additional COMPARISON:  CT chest, abdomen and pelvis today FINDINGS: CT THORACIC SPINE FINDINGS Alignment: Mild convex leftward scoliosis in the upper thoracic spine and convex rightward scoliosis in the lower thoracic spine. No subluxation. Vertebrae: No fracture or focal bone lesion. Paraspinal and other soft tissues: Negative Disc levels: Diffuse degenerative disc disease with anterior spurring. CT LUMBAR SPINE FINDINGS  Segmentation: 5 lumbar type vertebrae. Alignment: Moderate convex leftward scoliosis centered in the mid lumbar spine. 3 mm of anterolisthesis of L5 on S1. Vertebrae: No acute fracture or focal pathologic process. Paraspinal and other soft tissues: Negative Disc levels: Diffuse degenerative disc disease and facet disease. IMPRESSION: Scoliosis, degenerative changes. No acute bony abnormality. Electronically Signed   By: Charlett Nose M.D.   On: 02/23/2023 21:30   CT CHEST ABDOMEN PELVIS W CONTRAST Result Date: 02/23/2023 CLINICAL DATA:  Fall. EXAM: CT CHEST, ABDOMEN, AND PELVIS WITH CONTRAST TECHNIQUE: Multidetector CT imaging of the chest, abdomen and pelvis was performed following the standard protocol during bolus administration of intravenous contrast. RADIATION DOSE REDUCTION: This exam was performed according to the departmental dose-optimization program which includes automated exposure control, adjustment of the mA and/or kV according to patient size and/or use of iterative reconstruction technique. CONTRAST:  75mL OMNIPAQUE IOHEXOL 350 MG/ML SOLN COMPARISON:  05/29/2022 FINDINGS: CT CHEST FINDINGS Cardiovascular: Heart normal size. Ascending thoracic aorta upper limits normal in diameter at 3.9 cm. Aortic atherosclerosis. Mediastinum/Nodes: No mediastinal, hilar, or axillary adenopathy. Trachea and esophagus are unremarkable. Thyroid unremarkable. Lungs/Pleura: Linear scarring in the lung bases. No acute confluent opacities, effusions or pneumothorax. Musculoskeletal: Severe pectus deformity of the sternum. No acute bony abnormality. Chest wall soft tissues are unremarkable. CT ABDOMEN PELVIS FINDINGS Hepatobiliary: No hepatic injury or perihepatic hematoma. Gallbladder is unremarkable. Pancreas: No focal abnormality or ductal dilatation. Spleen: No splenic injury or perisplenic hematoma. Adrenals/Urinary Tract: No adrenal hemorrhage or renal injury identified. Bladder is unremarkable. Stomach/Bowel:  Stomach, large and small bowel grossly unremarkable. Vascular/Lymphatic: Aortoiliac atherosclerosis. No evidence of aneurysm or adenopathy. Reproductive: Uterus and adnexa unremarkable.  No mass. Other: No free fluid or free air. Musculoskeletal: No acute bony abnormality. Convex leftward scoliosis in the lumbar spine. Degenerative disc and facet disease. No acute bony abnormality. IMPRESSION: 1. No significant traumatic injury or acute findings in the chest, abdomen or pelvis. 2. Aortic atherosclerosis. Electronically Signed   By: Charlett Nose M.D.   On: 02/23/2023 21:27    Review of Systems  Unable to perform ROS: Acuity of condition   Blood pressure (!) 142/103, pulse 91, temperature 99 F (37.2 C), temperature source Axillary, resp. rate (!) 21, weight 54.3 kg, SpO2 95%. Physical Exam Constitutional:      Appearance: Normal appearance. She is normal weight.  HENT:     Head: Normocephalic.     Comments: Small contusion over the right galea's vertex of scalp.    Nose: Nose normal.     Mouth/Throat:     Mouth: Mucous membranes are moist.     Pharynx: Oropharynx is clear.  Eyes:  Extraocular Movements: Extraocular movements intact.     Conjunctiva/sclera: Conjunctivae normal.     Pupils: Pupils are equal, round, and reactive to light.  Skin:    Capillary Refill: Capillary refill takes less than 2 seconds.  Neurological:     Mental Status: She is alert.     Comments: Patient is awake and alert speech has some errors but patient is fluent.  She is a left-handed individual and may indeed have Co. dominance.  Motor strength is good in both upper extremities there is a slight weakness in the right upper extremity compared to the left.  Psychiatric:        Mood and Affect: Mood normal.        Behavior: Behavior normal.        Thought Content: Thought content normal.        Judgment: Judgment normal.     Assessment/Plan: Traumatic cerebral contusions with coalescence into a 3-1/2 cm  hemorrhage in the posterior temporal region and a 1/2 cm hemorrhage in the subfrontal region.  Small amount of subarachnoid blood and subdural blood is also noted.  No significant shift or mass effect.  Patient's good functional status in regards to her speech may be related to the fact that she is left-handed and has cold dominance.  Plan these lesions do not need to be treated surgically and will likely resolve on their own nonetheless the neurologic sequelae from these lesions likely render the patient unable to live independently subsequent to recovery.  I will sign off at this time.  Shary Key Emmary Culbreath 02/24/2023, 8:35 AM

## 2023-02-25 ENCOUNTER — Other Ambulatory Visit: Payer: Self-pay

## 2023-02-25 LAB — CBC
HCT: 37.2 % (ref 36.0–46.0)
Hemoglobin: 12.5 g/dL (ref 12.0–15.0)
MCH: 31.3 pg (ref 26.0–34.0)
MCHC: 33.6 g/dL (ref 30.0–36.0)
MCV: 93 fL (ref 80.0–100.0)
Platelets: 242 10*3/uL (ref 150–400)
RBC: 4 MIL/uL (ref 3.87–5.11)
RDW: 15.3 % (ref 11.5–15.5)
WBC: 11.9 10*3/uL — ABNORMAL HIGH (ref 4.0–10.5)
nRBC: 0 % (ref 0.0–0.2)

## 2023-02-25 LAB — BASIC METABOLIC PANEL
Anion gap: 6 (ref 5–15)
BUN: 35 mg/dL — ABNORMAL HIGH (ref 8–23)
CO2: 24 mmol/L (ref 22–32)
Calcium: 8.7 mg/dL — ABNORMAL LOW (ref 8.9–10.3)
Chloride: 107 mmol/L (ref 98–111)
Creatinine, Ser: 0.92 mg/dL (ref 0.44–1.00)
GFR, Estimated: 59 mL/min — ABNORMAL LOW (ref >60)
Glucose, Bld: 116 mg/dL — ABNORMAL HIGH (ref 70–99)
Potassium: 3.7 mmol/L (ref 3.5–5.1)
Sodium: 137 mmol/L (ref 135–145)

## 2023-02-25 MED ORDER — HEPARIN SODIUM (PORCINE) 5000 UNIT/ML IJ SOLN
5000.0000 [IU] | Freq: Two times a day (BID) | INTRAMUSCULAR | Status: DC
  Administered 2023-02-25 – 2023-03-06 (×19): 5000 [IU] via SUBCUTANEOUS
  Filled 2023-02-25 (×18): qty 1

## 2023-02-25 MED ORDER — PANTOPRAZOLE SODIUM 40 MG PO TBEC
40.0000 mg | DELAYED_RELEASE_TABLET | Freq: Every day | ORAL | Status: DC
  Administered 2023-02-25 – 2023-03-05 (×9): 40 mg via ORAL
  Filled 2023-02-25 (×8): qty 1

## 2023-02-25 NOTE — Progress Notes (Signed)
Pt noted to have Afib on monitor around 0850, pt asymptomatic. Rhythm ended while setting EKG up. Dr. Roda Shutters informed.

## 2023-02-25 NOTE — Progress Notes (Signed)
STROKE TEAM PROGRESS NOTE   BRIEF HPI [name] is a 89 y.o. female with history of hypertension, peripheral neuropathy, hyperlipidemia, IBS, migraines, mild dementia, mitral valve prolapse, chronic kidney disease, vitamin D deficiency and recurrent falls presented after being found down at home with incoherent speech.  Patient was noted to have a left temporal intracerebral hemorrhage on CT  NIH on Admission 6  ICH score 2  SIGNIFICANT HOSPITAL EVENTS 12/23-patient admitted with left temporal ICH 12/25--transfer out of ICU  INTERIM HISTORY/SUBJECTIVE Son at the bedside. Pt neuro unchanged, still has disorientation but mental status stable moving all extremities. Not on IV BP meds. Tele showed afib but EKG showed frequent PACs.    OBJECTIVE  CBC    Component Value Date/Time   WBC 11.9 (H) 02/25/2023 0509   RBC 4.00 02/25/2023 0509   HGB 12.5 02/25/2023 0509   HCT 37.2 02/25/2023 0509   PLT 242 02/25/2023 0509   MCV 93.0 02/25/2023 0509   MCH 31.3 02/25/2023 0509   MCHC 33.6 02/25/2023 0509   RDW 15.3 02/25/2023 0509   LYMPHSABS 0.7 02/23/2023 1855   MONOABS 0.6 02/23/2023 1855   EOSABS 0.0 02/23/2023 1855   BASOSABS 0.0 02/23/2023 1855    BMET    Component Value Date/Time   NA 137 02/25/2023 0509   K 3.7 02/25/2023 0509   CL 107 02/25/2023 0509   CO2 24 02/25/2023 0509   GLUCOSE 116 (H) 02/25/2023 0509   BUN 35 (H) 02/25/2023 0509   CREATININE 0.92 02/25/2023 0509   CREATININE 0.81 06/27/2013 1148   CALCIUM 8.7 (L) 02/25/2023 0509   GFRNONAA 59 (L) 02/25/2023 0509    IMAGING past 24 hours VAS US CAROTID Result Date: 02/25/2023 Carotid Arterial Duplex Study Patient Name:  Christine Henderson  Date of Exam:   02/24/2023 Medical Rec #: [id number]        Accession #:    [id number] Date of Birth: 05/21/1930        Patient Gender: F Patient Age:   27 years Exam Location:  [facility] Procedure:      VAS US CAROTID Referring Phys: Scheryl Marten Cutler Sunday  --------------------------------------------------------------------------------  Indications:  CVA. Risk Factors: Hypertension, hyperlipidemia, prior CVA. Performing Technologist: Shona Simpson  Examination Guidelines: A complete evaluation includes B-mode imaging, spectral Doppler, color Doppler, and power Doppler as needed of all accessible portions of each vessel. Bilateral testing is considered an integral part of a complete examination. Limited examinations for reoccurring indications may be performed as noted.  Right Carotid Findings: +----------+--------+--------+--------+------------------+--------+           PSV cm/sEDV cm/sStenosisPlaque DescriptionComments +----------+--------+--------+--------+------------------+--------+ CCA Prox  60      11                                         +----------+--------+--------+--------+------------------+--------+ CCA Distal74      14                                         +----------+--------+--------+--------+------------------+--------+ ICA Prox  55      14      1-39%   homogeneous                +----------+--------+--------+--------+------------------+--------+ ICA Distal57      15                                         +----------+--------+--------+--------+------------------+--------+  ECA       69      10                                         +----------+--------+--------+--------+------------------+--------+ +----------+--------+-------+--------+-------------------+           PSV cm/sEDV cmsDescribeArm Pressure (mmHG) +----------+--------+-------+--------+-------------------+ Subclavian106     0                                  +----------+--------+-------+--------+-------------------+ +---------+--------+--+--------+-+ VertebralPSV cm/s25EDV cm/s6 +---------+--------+--+--------+-+  Left Carotid Findings: +----------+--------+--------+--------+------------------+------------------+           PSV  cm/sEDV cm/sStenosisPlaque DescriptionComments           +----------+--------+--------+--------+------------------+------------------+ CCA Prox  63      11                                                   +----------+--------+--------+--------+------------------+------------------+ CCA Distal67      14                                intimal thickening +----------+--------+--------+--------+------------------+------------------+ ICA Prox  48      11      1-39%   heterogenous                         +----------+--------+--------+--------+------------------+------------------+ ICA Distal70      20                                                   +----------+--------+--------+--------+------------------+------------------+ ECA       67      9                                                    +----------+--------+--------+--------+------------------+------------------+ +----------+--------+--------+--------+-------------------+           PSV cm/sEDV cm/sDescribeArm Pressure (mmHG) +----------+--------+--------+--------+-------------------+ [id number]      0                                   +----------+--------+--------+--------+-------------------+ +---------+--------+--+--------+-+ VertebralPSV cm/s43EDV cm/s9 +---------+--------+--+--------+-+   Summary: Right Carotid: Velocities in the right ICA are consistent with a 1-39% stenosis. Left Carotid: Velocities in the left ICA are consistent with a 1-39% stenosis. Vertebrals:  Bilateral vertebral arteries demonstrate antegrade flow. Subclavians: Normal flow hemodynamics were seen in bilateral subclavian              arteries. *See table(s) above for measurements and observations.  Electronically signed by Gerarda Fraction on 02/25/2023 at 11:53:41 AM.    Final     Vitals:   02/25/23 1100 02/25/23 1200 02/25/23 1230 02/25/23 1300  BP: 119/73 (!) 152/82 (!) 114/97 128/74  Pulse: 66 74 76 82  Resp: 20 18 19   (!)  25  Temp:  98.3 F (36.8 C)    TempSrc:  Axillary    SpO2: 100% 99% 92% 98%  Weight:         PHYSICAL EXAM General:  Alert, well-nourished, well-developed patient in no acute distress Psych:  Mood and affect appropriate for situation CV: Regular rate and rhythm on monitor Respiratory:  Regular, unlabored respirations on room air  NEURO:  Pt awake, alert, eyes open, orientated to people only but not to age, place, or time. Able to say short sentences but not full sentences. Able to follow most simple commands. Able to name 2/3 but did not repeat. Psychomotor slowing. No gaze palsy, tracking bilaterally, blinking to visual threat bilaterally. No facial droop. Tongue protrusion midline. Bilateral UEs 4/5, no drift. Bilaterally LEs 3/5, no drift. Sensation and coordination not quite cooperative, gait not tested.   ASSESSMENT/PLAN  ICH: multifocal ICH at left temporal, frontal and frontoparietal convexity, etiology traumatic vs. CAA CT head 3.3 cm left temporal ICH with small amount of overlying subarachnoid blood Repeat Head CT 12/24 multifocal left hemisphere acute hemorrhagic contusions, stable small volume left subarachnoid hemorrhage, small volume layering intraventricular hemorrhage with no midline shift or signs of hydrocephalus MRI posttraumatic IPH in left temporal and frontal region with mild surrounding edema and small amount of IVH, no significant volume of subarachnoid blood, 1 mm thick subdural blood along left lateral convexity MRA no LVO or proximal stenosis Carotid Doppler unremarkable 2D Echo EF 65-70% LDL 59 HgbA1c 6.4 UDS neg VTE prophylaxis -SCDs No antithrombotic prior to admission, now on No antithrombotic due to ICH and CAA Therapy recommendations:  SNF Disposition: pending  Acute ischemic stroke, incidental finding - left frontal white matter punctate stroke MRI showed punctate acute ischemic stroke in left frontal white matter Likely from small vessel  disease, ischemic WM changes and CAA No antithrombotics due to ICH and CAA  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 MRI showed old hemorrhagic stroke in medial right parieto-occipital and right PCA region, chronic small vessel ischemic changes and innumerable punctate foci of hemosiderin deposition likely represent CAA No antithrombotics  Frequent PACs Tele initially concerning for afib EKG showed SR with frequent PACs and I degree AVB Continue monitoring  Hypertension Home meds: Losartan 25 mg daily Stable As needed labetalol and hydralazine Long term BP goal < 140 given CAA  Hyperlipidemia Home meds: Simvastatin 10 mg daily LDL 59, goal < 70 Resume statin at discharge Not SATURN candidate given possible traumatic ICH  Other Stroke Risk Factors Advanced age Migraine   Other Active Problems Mild dementia - resume home Aricept Leukocytosis WBC 16.4->12.9->11.9  Hospital day # 2  Marvel Plan, MD PhD Stroke Neurology 02/25/2023 2:07 PM  This patient is critically ill due to ICH, IVH and at significant risk of neurological worsening, death form 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 This patient's care requires constant monitoring of vital signs, hemodynamics, respiratory and cardiac monitoring, review of multiple databases, neurological assessment, discussion with family, other specialists and medical decision making of high complexity. I spent 35 minutes of neurocritical care time in the care of this patient. I had long discussion with son at bedside, updated pt current condition, treatment plan and potential prognosis, and answered all the questions. He expressed understanding and appreciation.        To contact Stroke Continuity provider, please refer to [email or web address]. After hours, contact General Neurology

## 2023-02-25 NOTE — Plan of Care (Signed)
  Problem: Intracerebral Hemorrhage Tissue Perfusion: Goal: Complications of Intracerebral Hemorrhage will be minimized 02/25/2023 1115 by Ronalee Red, RN Outcome: Progressing 02/25/2023 1115 by Ronalee Red, RN Outcome: Progressing   Problem: Coping: Goal: Will verbalize positive feelings about self Outcome: Progressing   Problem: Nutrition: Goal: Dietary intake will improve Outcome: Progressing

## 2023-02-25 NOTE — Plan of Care (Signed)
  Problem: Intracerebral Hemorrhage Tissue Perfusion: Goal: Complications of Intracerebral Hemorrhage will be minimized Outcome: Progressing   Problem: Coping: Goal: Will verbalize positive feelings about self Outcome: Progressing   Problem: Nutrition: Goal: Dietary intake will improve Outcome: Progressing

## 2023-02-26 DIAGNOSIS — E854 Organ-limited amyloidosis: Secondary | ICD-10-CM | POA: Diagnosis not present

## 2023-02-26 DIAGNOSIS — I619 Nontraumatic intracerebral hemorrhage, unspecified: Secondary | ICD-10-CM

## 2023-02-26 DIAGNOSIS — L8990 Pressure ulcer of unspecified site, unspecified stage: Secondary | ICD-10-CM | POA: Insufficient documentation

## 2023-02-26 DIAGNOSIS — I491 Atrial premature depolarization: Secondary | ICD-10-CM | POA: Diagnosis not present

## 2023-02-26 DIAGNOSIS — I10 Essential (primary) hypertension: Secondary | ICD-10-CM

## 2023-02-26 DIAGNOSIS — I680 Cerebral amyloid angiopathy: Secondary | ICD-10-CM

## 2023-02-26 LAB — BASIC METABOLIC PANEL
Anion gap: 8 (ref 5–15)
BUN: 27 mg/dL — ABNORMAL HIGH (ref 8–23)
CO2: 24 mmol/L (ref 22–32)
Calcium: 8.7 mg/dL — ABNORMAL LOW (ref 8.9–10.3)
Chloride: 106 mmol/L (ref 98–111)
Creatinine, Ser: 0.92 mg/dL (ref 0.44–1.00)
GFR, Estimated: 59 mL/min — ABNORMAL LOW (ref >60)
Glucose, Bld: 116 mg/dL — ABNORMAL HIGH (ref 70–99)
Potassium: 3.7 mmol/L (ref 3.5–5.1)
Sodium: 138 mmol/L (ref 135–145)

## 2023-02-26 LAB — CBC
HCT: 37.8 % (ref 36.0–46.0)
Hemoglobin: 12.6 g/dL (ref 12.0–15.0)
MCH: 31.5 pg (ref 26.0–34.0)
MCHC: 33.3 g/dL (ref 30.0–36.0)
MCV: 94.5 fL (ref 80.0–100.0)
Platelets: 256 10*3/uL (ref 150–400)
RBC: 4 MIL/uL (ref 3.87–5.11)
RDW: 15.3 % (ref 11.5–15.5)
WBC: 9 10*3/uL (ref 4.0–10.5)
nRBC: 0 % (ref 0.0–0.2)

## 2023-02-26 MED ORDER — SIMVASTATIN 20 MG PO TABS
10.0000 mg | ORAL_TABLET | Freq: Every day | ORAL | Status: DC
  Administered 2023-02-26 – 2023-03-05 (×8): 10 mg via ORAL
  Filled 2023-02-26 (×7): qty 1

## 2023-02-26 MED ORDER — LOSARTAN POTASSIUM 25 MG PO TABS
12.5000 mg | ORAL_TABLET | Freq: Every day | ORAL | Status: DC
  Administered 2023-02-26: 12.5 mg via ORAL
  Filled 2023-02-26: qty 0.5

## 2023-02-26 MED ORDER — LOSARTAN POTASSIUM 25 MG PO TABS
25.0000 mg | ORAL_TABLET | Freq: Every day | ORAL | Status: DC
  Administered 2023-02-27 – 2023-03-02 (×4): 25 mg via ORAL
  Filled 2023-02-26 (×4): qty 1

## 2023-02-26 NOTE — Progress Notes (Signed)
Inpatient Rehab Admissions Coordinator Note:   Per OT recommendations patient was screened for CIR candidacy by Stephania Fragmin, PT. At this time, pt appears to be a potential candidate for CIR. I will place an order for rehab consult for full assessment, per our protocol.  Please contact me any with questions.Estill Dooms, PT, DPT [phone] 02/26/23 12:55 PM

## 2023-02-26 NOTE — Evaluation (Signed)
Occupational Therapy Evaluation Patient Details Name: Christine Henderson MRN: [id number] DOB: 09-30-1930 Today's Date: 02/26/2023   History of Present Illness 89 yo female presents to [facility] on 12/23 with fall at home. CTH reveals multifocal L hemisphere acute hemorrhagic contusions, largest is 3.5 cm L temporal with small amount of overlying subarachnoid blood and IVH. MRI punctate acute ischemic stroke in left frontal white matter.  PMH includes HLD, HTN, peripheral neuropathy, osteopenia, recurrent falls, disequilibrium, IBS, mild dementia.   Clinical Impression   Pt admitted with L CVA. Pt currently with functional limitations due to the deficits listed below (see OT Problem List). Prior to admit, pt was living alone at home with intermittent assist from family and personal aide attendant. Pt will benefit from acute skilled OT to increase their safety and independence with ADL and functional mobility for ADL to facilitate discharge. Patient will benefit from intensive inpatient follow up therapy, >3 hours/day. OT will continue to follow patient acutely.          If plan is discharge home, recommend the following: A lot of help with walking and/or transfers;A lot of help with bathing/dressing/bathroom;Assist for transportation;Supervision due to cognitive status;Direct supervision/assist for financial management;Direct supervision/assist for medications management    Functional Status Assessment  Patient has had a recent decline in their functional status and demonstrates the ability to make significant improvements in function in a reasonable and predictable amount of time.  Equipment Recommendations  Other (comment) (defer to next venue of care)    Recommendations for Other Services Rehab consult     Precautions / Restrictions Precautions Precautions: Fall Precaution Comments: SBP< 160 Restrictions Weight Bearing Restrictions Per Provider Order: No      Mobility Bed  Mobility Overal bed mobility: Needs Assistance Bed Mobility: Supine to Sit     Supine to sit: Supervision, HOB elevated     General bed mobility comments: VC to initiate task. No physical assist required to complete.    Transfers Overall transfer level: Needs assistance Equipment used: Rolling walker (2 wheels) Transfers: Sit to/from Stand, Bed to chair/wheelchair/BSC Sit to Stand: Min assist, From elevated surface     Step pivot transfers: Mod assist     General transfer comment: Pt provided with VC for step by step sequencing while using RW to complete functional transfer from bed to recliner. VC for hand placement prior to sit to stand transition. Min A also provided for RW management during step pivot.      Balance Overall balance assessment: Needs assistance, History of Falls   Sitting balance-Leahy Scale: Good Sitting balance - Comments: sitting EOB and able to reach forward and touch toes without LOB. CGA provided for balance   Standing balance support: Bilateral upper extremity supported, During functional activity, Reliant on assistive device for balance Standing balance-Leahy Scale: Poor        ADL either performed or assessed with clinical judgement   ADL Overall ADL's : Needs assistance/impaired Eating/Feeding: Set up;Sitting   Grooming: Wash/dry face;Brushing hair;Set up;Sitting;Wash/dry hands   Upper Body Bathing: Set up;Sitting   Lower Body Bathing: Moderate assistance;Sit to/from stand   Upper Body Dressing : Minimal assistance;Sitting   Lower Body Dressing: Maximal assistance;Sit to/from stand Lower Body Dressing Details (indicate cue type and reason): Able to touch feet with increased effort and pull socks down partially while seated EOB. Toilet Transfer: Moderate assistance;Stand-pivot;Rolling walker (2 wheels)   Toileting- Clothing Manipulation and Hygiene: Total assistance;Sit to/from stand  Vision Baseline Vision/History:  1 Wears glasses Ability to See in Adequate Light: 0 Adequate Patient Visual Report: No change from baseline Vision Assessment?: Wears glasses for reading Additional Comments: Son reports that recently her vision may be decreasing as she was an avid newpaper reader and lately has been having difficulty doing so.     Perception Perception: Not tested       Praxis Praxis: Not tested       Pertinent Vitals/Pain Pain Assessment Pain Assessment: No/denies pain     Extremity/Trunk Assessment Upper Extremity Assessment Upper Extremity Assessment: Left hand dominant;Overall WFL for tasks assessed (age related weakness)   Lower Extremity Assessment Lower Extremity Assessment: Defer to PT evaluation;Generalized weakness   Cervical / Trunk Assessment Cervical / Trunk Assessment: Kyphotic   Communication Communication Communication: Hearing impairment Cueing Techniques: Verbal cues;Tactile cues;Visual cues   Cognition Arousal: Alert Behavior During Therapy: WFL for tasks assessed/performed Overall Cognitive Status: History of cognitive impairments - at baseline Area of Impairment: Orientation, Memory, Following commands, Safety/judgement, Problem solving        Orientation Level: Disoriented to, Place, Time, Situation   Memory: Decreased short-term memory Following Commands: Follows one step commands with increased time Safety/Judgement: Decreased awareness of deficits, Decreased awareness of safety   Problem Solving: Slow processing, Decreased initiation, Requires verbal cues, Requires tactile cues General Comments: Able to state her birthday month and day. Unable to state birthday year or how old she was. When provided 2 choices on location, pt was able to select hospital.     General Comments  BP prior to OOB activity 117/72 (85), SpO2 100% on RA            Home Living Family/patient expects to be discharged to:: Private residence Living Arrangements: Alone Available  Help at Discharge: Family;Available PRN/intermittently;Personal care attendant Type of Home: House (townhouse) Home Access: Level entry     Home Layout: One level     Bathroom Shower/Tub: Producer, television/film/video: Standard     Home Equipment: Cane - single Librarian, academic (2 wheels);Shower seat   Additional Comments: has life alert but does not remember to push it when she falls  Lives With: Alone;Other (Comment) (family support)    Prior Functioning/Environment Prior Level of Function : Needs assist;History of Falls (last six months)  Cognitive Assist : ADLs (cognitive)     Physical Assist : ADLs (physical)     Mobility Comments: Independent. Pt states that she doesn't always use her walker at home. ADLs Comments: bathing help, help for meals, gets help from service 3 nights a week, friend comes 3 days a week at lunch and son comes Sunday and Wednesday        OT Problem List: Decreased strength;Decreased safety awareness;Decreased activity tolerance;Decreased knowledge of use of DME or AE;Impaired balance (sitting and/or standing);Decreased coordination      OT Treatment/Interventions: Self-care/ADL training;Therapeutic activities;Therapeutic exercise;Neuromuscular education;Patient/family education;DME and/or AE instruction;Balance training;Manual therapy;Modalities    OT Goals(Current goals can be found in the care plan section) Acute Rehab OT Goals Patient Stated Goal: to sit in chair OT Goal Formulation: Patient unable to participate in goal setting Time For Goal Achievement: 03/13/23 Potential to Achieve Goals: Good  OT Frequency: Min 1X/week       AM-PAC OT "6 Clicks" Daily Activity     Outcome Measure Help from another person eating meals?: A Little Help from another person taking care of personal grooming?: A Little Help from another person toileting, which includes using  toliet, bedpan, or urinal?: A Lot Help from another person bathing  (including washing, rinsing, drying)?: A Lot Help from another person to put on and taking off regular upper body clothing?: A Little Help from another person to put on and taking off regular lower body clothing?: A Lot 6 Click Score: 15   End of Session Equipment Utilized During Treatment: Gait belt;Rolling walker (2 wheels) Nurse Communication: Mobility status  Activity Tolerance: Patient tolerated treatment well Patient left: in chair;with call bell/phone within reach;with chair alarm set;with family/visitor present  OT Visit Diagnosis: Unsteadiness on feet (R26.81);History of falling (Z91.81)                Time: 1000-1031 OT Time Calculation (min): 31 min Charges:  OT General Charges $OT Visit: 1 Visit OT Evaluation $OT Eval Moderate Complexity: 1 Mod OT Treatments $Therapeutic Activity: 8-22 mins  Limmie Patricia, OTR/L,CBIS  Supplemental OT - [facility] and [facility] Secure Chat Preferred    Jesse Nosbisch, Charisse March 02/26/2023, 11:22 AM

## 2023-02-26 NOTE — Hospital Course (Addendum)
Christine Henderson is a 89 y.o. female with a history of primary hypertension, peripheral neuropathy, hyperlipidemia, dementia.  Patient presented secondary to being found down and found to have evidence of an intracerebral hemorrhage with incidental finding of an acute ischemic stroke. Patient admitted by neurology to ICU. Neurosurgery consulted with recommendations for no neurosurgical intervention. Patient managed off of antithrombotic therapy . Neurology has seen the patient.  Patient remains weak and deconditioned and planning for CIR Awaiting for CIR approval and remains medically stable

## 2023-02-26 NOTE — TOC Initial Note (Signed)
Transition of Care [facility]) - Initial/Assessment Note    Patient Details  Name: Christine Henderson MRN: [id number] Date of Birth: 1930/03/15  Transition of Care [facility]) CM/SW Contact:    Mearl Latin, LCSW Phone Number: 02/26/2023, 1:07 PM  Clinical Narrative:                 CSW received consult for possible SNF placement at time of discharge. CSW spoke with patient and son at bedside (patient pleasant but disoriented). Patient's son reported that they would like for patient to go to CIR and then he has plans in motion to move her into an ALF after that. CSW discussed SNF as an alternative option if CIR is not approved and son stated they have had experiences at [facility] and prefer not to go that route if possible. CSW sent request to CIR for review.   Skilled Nursing Rehab Facilities-   [email or web address]   Ratings out of 5 stars (5 the highest)   Name Address  Phone # Quality Care Staffing Health Inspection Overall  [facility] [address] [place] [phone] 2 2 5 5   [facility] [address] [place] [phone] 4 2 4 4   [facility] [address], [place]in[place] Otto [phone] 2 1 2 1   [facility] [address], [place] [phone] 3 1 4 3   [facility]  [address], [place] [phone] 4 4 5 5   [facility] [address] [facility] [phone] 3 2 2 2   [facility] [address], [place] [phone] 5 1 2 2   [facility] [address], [place] [phone] 1 1 3 1   [facility] ([facility]) [address], [place] [phone] 2 2 2 2   [facility] [address] [place], [place] [phone] 2 2 1 1   [facility] [address], [place] [phone] 3 1 1 1   [name] [address] [place]iane Shi [phone] 3 3 4 4   [facility] [address] [place] [phone] 2 2 3 3           [facility]  [address] [place] [phone] [address] [place] [address], [place] [phone] 1 1 2 1   [facility] [address], [place] [phone] 2 1 4 3   [facility] [address], [place] [phone] 2 1 4 3   [facility] [address] [place] [phone] 2 3 3 3           [address] (no [facility]) [address], [place] [phone] 4 5 5 5   [facility] (No Humana) [address] Lavera Gu[place] [phone] 1 2 4 3   [facility] [address] [place] [phone] 2 1 2 1   [facility] (No [facility]) [address], [place] [phone] 4 1 5 4   [facility] [address], [facility] [phone] 3 4 2 2   [facility] [address] [place] [phone] 2 1 1 1   [place] [address] [place] [phone] 4 2 5 5   [facility]  [address] [place] [phone] 2 2 3 3   [facility] [address] [place] [phone] 4 1 1 1   [facility] [address] [place], [place] [phone] 2 2 3 3           [facility] [address] [place] [phone] 2 1 1 1   [facility] [address] [place]lyn Clines  [phone] 3 3  3 3  [facility] (No Humana) [address], [place] [phone] 2 2 4 4   [facility]) [address], [place]alita Levan [phone] 2 1 1 1   [facility] [address], [place] [phone] [facility] [address] [place] [place] [address], [place] [phone] 1 1 1 1           [facility] [address] [place], [place] [phone] 5 4 5 5   [facility])  [address] [place] [phone] 1 1 2 1   [facility] [address], [place] [phone]  2 4 4   [facility] [address], [place] [phone] 3 5 5 5   [address] [place], [place] [phone] 4 2 2 2   [facility]) [address] [place] [phone] 2  1 3 2      Expected Discharge Plan: IP Rehab Facility Barriers to Discharge: Continued Medical Work up, Insurance Authorization   Patient Goals and CMS Choice Patient states their goals for this hospitalization and ongoing recovery are:: CIR CMS Medicare.gov Compare Post Acute Care list provided to:: Patient Represenative (must comment) Choice offered to / list presented to : Adult Children [place] ownership interest in .provided to:: Adult Children    Expected Discharge Plan and Services In-house Referral: Clinical Social Work   Post Acute Care Choice: IP Rehab Living arrangements for the past 2 months: Single Family Home                                      Prior Living Arrangements/Services Living arrangements for the past 2 months: Single Family Home Lives with:: Self Patient language and need for interpreter reviewed:: Yes Do you feel safe going back to the place where you live?: Yes      Need for Family Participation in Patient Care: Yes (Comment) Care giver support system in place?: Yes (comment)   Criminal Activity/Legal Involvement Pertinent to Current Situation/Hospitalization: No - Comment as needed  Activities of Daily Living      Permission Sought/Granted Permission sought to share information with : Facility Medical sales representative, Family Supports Permission granted to share information with : Yes, Verbal Permission Granted  Share Information with NAME: Viviann Spare     Permission granted to share info w Relationship: Son  Permission granted to share info w Contact Information: [phone]  Emotional Assessment Appearance:: Appears stated age Attitude/Demeanor/Rapport: Gracious Affect (typically observed): Pleasant Orientation: : Oriented to Self Alcohol / Substance Use: Not Applicable Psych Involvement: No (comment)  Admission diagnosis:  Hemorrhagic stroke [facility]) [I61.9] Patient Active Problem List   Diagnosis Date Noted    Cerebral amyloid angiopathy (HCC) 02/26/2023   Pressure injury of skin 02/26/2023   Hemorrhagic stroke (HCC) 02/23/2023   Mild dementia 09/10/2022   Edema 08/14/2022   Renal insufficiency 08/14/2022   Recurrent falls 01/30/2022   Disequilibrium 10/25/2021   Heart murmur 10/25/2021   Vitamin D deficiency 07/14/2016   Hereditary and idiopathic peripheral neuropathy 01/10/2016   Paresthesia of foot, bilateral 07/09/2014   Hyperglycemia 07/09/2014   Onychomycosis 07/09/2014   Lumbar back pain with radiculopathy affecting left lower extremity 11/13/2013   Back pain 06/27/2013   Insomnia 11/14/2012   Osteopenia 11/12/2012   Vasomotor rhinitis 02/02/2008   Essential hypertension 10/06/2007   Hyperlipidemia 10/01/2006   Postmenopausal atrophic vaginitis 10/01/2006   Irritable  bowel syndrome 09/10/2006   Disorder of bone and cartilage 09/10/2006   PCP:  Bradd Canary, MD Pharmacy:   [facility] [address] [place], [place] - [address] [place] [address] Phone: [phone] Fax: [phone]  [facility] Delivery) - [place] - [address] [facility] [address] [place] [address] [place] [address] Phone: [phone] Fax: [phone]  [facility] - [place], [place] - [address] [place] [address] Phone: [phone] Fax: [phone]     Social Drivers of Health (SDOH) Social History: SDOH Screenings   Food Insecurity: Patient Unable To Answer (02/24/2023)  Housing: Patient Unable To Answer (02/24/2023)  Transportation Needs: Patient Unable To Answer (02/24/2023)  Utilities: Patient Unable To Answer (02/24/2023)  Alcohol Screen: Low Risk  (10/11/2020)  Depression (PHQ2-9): Low Risk  (01/28/2022)  Financial Resource Strain: Low Risk  (10/11/2020)  Physical Activity: Insufficiently Active (10/11/2020)  Social Connections: Moderately Isolated  (10/11/2020)  Stress: No Stress Concern Present (10/11/2020)  Tobacco Use: Low Risk  (02/05/2023)   SDOH Interventions:     Readmission Risk Interventions     No data to display

## 2023-02-26 NOTE — Evaluation (Signed)
Speech Language Pathology Evaluation Patient Details Name: Christine Henderson MRN: [id number] DOB: [birth year] Today's Date: 02/26/2023 Time: 6962-9528 SLP Time Calculation (min) (ACUTE ONLY): 23 min  Problem List:  Patient Active Problem List   Diagnosis Date Noted   Cerebral amyloid angiopathy (HCC) 02/26/2023   Pressure injury of skin 02/26/2023   Hemorrhagic stroke (HCC) 02/23/2023   Mild dementia 09/10/2022   Edema 08/14/2022   Renal insufficiency 08/14/2022   Recurrent falls 01/30/2022   Disequilibrium 10/25/2021   Heart murmur 10/25/2021   Vitamin D deficiency 07/14/2016   Hereditary and idiopathic peripheral neuropathy 01/10/2016   Paresthesia of foot, bilateral 07/09/2014   Hyperglycemia 07/09/2014   Onychomycosis 07/09/2014   Lumbar back pain with radiculopathy affecting left lower extremity 11/13/2013   Back pain 06/27/2013   Insomnia 11/14/2012   Osteopenia 11/12/2012   Vasomotor rhinitis 02/02/2008   Essential hypertension 10/06/2007   Hyperlipidemia 10/01/2006   Postmenopausal atrophic vaginitis 10/01/2006   Irritable bowel syndrome 09/10/2006   Disorder of bone and cartilage 09/10/2006   Past Medical History:  Past Medical History:  Diagnosis Date   Abdominal pain 06/26/2008   Back pain 06/27/2013   Neck and low back   Bilateral impacted cerumen 10/21/2020   Disequilibrium 10/25/2021   Edema 08/14/2022   Essential hypertension 10/06/2007   Fibrocystic breast disease    Heart murmur 10/25/2021   Hereditary and idiopathic peripheral neuropathy 01/10/2016   Hyperglycemia 07/09/2014   Hyperlipidemia 10/01/2006   Insomnia 11/14/2012   Irritable bowel syndrome 09/10/2006   Lumbar back pain with radiculopathy affecting left lower extremity 11/13/2013   Metatarsal stress fracture 01/20/2016   Migraine headache 09/10/2006   Mild dementia 09/10/2022   MVP (mitral valve prolapse)    Onychomycosis 07/09/2014   Osteopenia 11/12/2012   Palpitations 09/10/2006    Paresthesia of foot, bilateral 07/09/2014   Postmenopausal atrophic vaginitis 10/01/2006   Recurrent falls 01/30/2022   Renal insufficiency 08/14/2022   Vasomotor rhinitis 02/02/2008   Vitamin D deficiency 07/14/2016   Past Surgical History:  Past Surgical History:  Procedure Laterality Date   DENTAL SURGERY  09/2014   ROTATOR CUFF REPAIR Right    TONSILLECTOMY     HPI:  89 yo female [facility] with fall at home. CTH reveals multifocal L hemisphere acute hemorrhagic contusions, largest is 3.5 cm L temporal with small amount of overlying subarachnoid blood and IVH. PMH includes HLD, HTN, peripheral neuropathy, osteopenia, recurrent falls, disequilibrium, IBS, mild dementia.   Assessment / Plan / Recommendation Clinical Impression  Pt presents with an acute aphasia overlying prior cognitive impairment. Speech is clear, without dysarthria, and fluent. She demonstrates new word-finding impairment related to naming of functional objects in room and responsive naming.  She did well with following one-step commands and discriminating among items; R/L discrimination and body part i.d. were WNL.  Working [employer] fast streamer   and LTM deficits have been exacerbated.  Family is hoping for CIR-level rehab and then potential D/C to ALF. Pt will benefit from SLP f/u here and at next level of care to optimize her independence as much as able.  Will follow.    SLP Assessment  SLP Recommendation/Assessment: Patient needs continued Speech Lanaguage Pathology Services SLP Visit Diagnosis: Aphasia (R47.01)    Recommendations for follow up therapy are one component of a multi-disciplinary discharge planning process, led by the attending physician.  Recommendations may be updated based on patient status, additional functional criteria and insurance authorization.  Assistance Recommended at Discharge  Frequent or constant Supervision/Assistance  Functional Status Assessment Patient has had a  recent decline in their functional status and demonstrates the ability to make significant improvements in function in a reasonable and predictable amount of time.  Frequency and Duration min 2x/week  1 week      SLP Evaluation Cognition  Overall Cognitive Status: History of cognitive impairments - at baseline Arousal/Alertness: Awake/alert Orientation Level: Disoriented to time;Disoriented to situation;Disoriented to place Attention: Sustained Sustained Attention: Appears intact Memory: Impaired Memory Impairment: Storage deficit;Retrieval deficit Awareness: Impaired Awareness Impairment: Intellectual impairment Safety/Judgment: Impaired       Comprehension  Auditory Comprehension Overall Auditory Comprehension: Impaired Yes/No Questions: Impaired Basic Immediate Environment Questions: 25-49% accurate Commands: Impaired One Step Basic Commands: 75-100% accurate Two Step Basic Commands: 50-74% accurate Visual Recognition/Discrimination Discrimination: Within Function Limits Reading Comprehension Reading Status: Not tested    Expression Expression Primary Mode of Expression: Verbal Verbal Expression Overall Verbal Expression: Impaired Initiation: No impairment Automatic Speech: Day of week Level of Generative/Spontaneous Verbalization: Sentence Repetition: No impairment Naming: Impairment Responsive: 0-25% accurate Confrontation: Impaired Convergent: 50-74% accurate Pragmatics: No impairment Written Expression Dominant Hand: Right Written Expression: Not tested   Oral / Motor  Oral Motor/Sensory Function Overall Oral Motor/Sensory Function: Within functional limits Motor Speech Overall Motor Speech: Appears within functional limits for tasks assessed            Christine Henderson 02/26/2023, 11:20 AM Marchelle Folks L. Samson Frederic, MA CCC/SLP Clinical Specialist - Acute Care SLP Acute Rehabilitation Services Office number [phone]

## 2023-02-26 NOTE — Progress Notes (Signed)
Physical Therapy Treatment Patient Details Name: Christine Henderson MRN: [id number] DOB: 1930-05-01 Today's Date: 02/26/2023   History of Present Illness 89 yo female presents to [facility] on [facility]23 with fall at home. CTH reveals multifocal L hemisphere acute hemorrhagic contusions, largest is 3.5 cm L temporal with small amount of overlying subarachnoid blood and IVH. MRI punctate acute ischemic stroke in left frontal white matter.  PMH includes HLD, HTN, peripheral neuropathy, osteopenia, recurrent falls, disequilibrium, IBS, mild dementia.    PT Comments  Pt progressing steadily toward goals.  Emphasis on transitions to EOB, safety with sit to stand, initiating  with cues for direction, and progression of gait.     If plan is discharge home, recommend the following: A little help with walking and/or transfers;A little help with bathing/dressing/bathroom;Assistance with cooking/housework;Assist for transportation;Help with stairs or ramp for entrance   Can travel by private vehicle        Equipment Recommendations  None recommended by PT    Recommendations for Other Services Rehab consult     Precautions / Restrictions Precautions Precautions: Fall     Mobility  Bed Mobility Overal bed mobility: Needs Assistance Bed Mobility: Supine to Sit     Supine to sit: Contact guard (lower HOB)     General bed mobility comments: cues for direction.    Transfers Overall transfer level: Needs assistance Equipment used: Rolling walker (2 wheels) Transfers: Sit to/from Stand Sit to Stand: Min assist           General transfer comment: cues for hand placement and initiation to EOB.  forward and steady assist.    Ambulation/Gait Ambulation/Gait assistance: Min assist Gait Distance (Feet): 12 Feet (to the toilet then 35 feet out into the hall and back to the chair.) Assistive device: Rolling walker (2 wheels) Gait Pattern/deviations: Step-through pattern Gait velocity: slow Gait  velocity interpretation: <1.31 ft/sec, indicative of household ambulator   General Gait Details: mildly unsteady with need for stability assist, help maneuvering the RW away from obstacles, cuing for backing up to the chair and toilet.   Stairs             Wheelchair Mobility     Tilt Bed    Modified Rankin (Stroke Patients Only) Modified Rankin (Stroke Patients Only) Pre-Morbid Rankin Score: Moderate disability Modified Rankin: Moderately severe disability     Balance Overall balance assessment: Needs assistance, History of Falls Sitting-balance support: Bilateral upper extremity supported, No upper extremity supported, Feet supported Sitting balance-Leahy Scale: Good     Standing balance support: Bilateral upper extremity supported, During functional activity Standing balance-Leahy Scale: Poor Standing balance comment: reliant on RW and minor external assist                            Cognition Arousal: Alert Behavior During Therapy: WFL for tasks assessed/performed Overall Cognitive Status: History of cognitive impairments - at baseline                         Following Commands: Follows one step commands with increased time     Problem Solving: Slow processing          Exercises      General Comments        Pertinent Vitals/Pain Pain Assessment Pain Assessment: Faces Faces Pain Scale: No hurt Pain Intervention(s): Monitored during session    Home Living  Prior Function            PT Goals (current goals can now be found in the care plan section) Acute Rehab PT Goals Patient Stated Goal: family agreeable to rehab PT Goal Formulation: With patient/family Time For Goal Achievement: 03/10/23 Potential to Achieve Goals: Fair Progress towards PT goals: Progressing toward goals    Frequency    Min 1X/week      PT Plan      Co-evaluation              AM-PAC PT "6 Clicks"  Mobility   Outcome Measure  Help needed turning from your back to your side while in a flat bed without using bedrails?: A Little Help needed moving from lying on your back to sitting on the side of a flat bed without using bedrails?: A Little Help needed moving to and from a bed to a chair (including a wheelchair)?: A Little Help needed standing up from a chair using your arms (e.g., wheelchair or bedside chair)?: A Little Help needed to walk in hospital room?: A Little Help needed climbing 3-5 steps with a railing? : Total 6 Click Score: 16    End of Session   Activity Tolerance: Patient tolerated treatment well Patient left: in chair;with call bell/phone within reach;with chair alarm set;with family/visitor present Nurse Communication: Mobility status PT Visit Diagnosis: Other abnormalities of gait and mobility (R26.89);Muscle weakness (generalized) (M62.81);Other symptoms and signs involving the nervous system (R29.898)     Time: 1610-9604 PT Time Calculation (min) (ACUTE ONLY): 27 min  Charges:    $Gait Training: 8-22 mins $Therapeutic Activity: 8-22 mins PT General Charges $$ ACUTE PT VISIT: 1 Visit                     02/26/2023  Jacinto Halim., PT Acute Rehabilitation Services [phone]  (office)   Eliseo Gum Draycen Leichter 02/26/2023, 5:14 PM

## 2023-02-26 NOTE — Progress Notes (Signed)
STROKE TEAM PROGRESS NOTE   BRIEF HPI [name] is a 89 y.o. female with history of hypertension, peripheral neuropathy, hyperlipidemia, IBS, migraines, mild dementia, mitral valve prolapse, chronic kidney disease, vitamin D deficiency and recurrent falls presented after being found down at home with incoherent speech.  Patient was noted to have a left temporal intracerebral hemorrhage on CT  NIH on Admission 6  ICH score 2  SIGNIFICANT HOSPITAL EVENTS 12/23-patient admitted with left temporal ICH 12/25--transfer out of ICU  INTERIM HISTORY/SUBJECTIVE Son at the bedside. Pt neuro unchanged, not fully orientated but orientated to people. PT and OT recommended CIR.    OBJECTIVE  CBC    Component Value Date/Time   WBC 9.0 02/26/2023 0515   RBC 4.00 02/26/2023 0515   HGB 12.6 02/26/2023 0515   HCT 37.8 02/26/2023 0515   PLT 256 02/26/2023 0515   MCV 94.5 02/26/2023 0515   MCH 31.5 02/26/2023 0515   MCHC 33.3 02/26/2023 0515   RDW 15.3 02/26/2023 0515   LYMPHSABS 0.7 02/23/2023 1855   MONOABS 0.6 02/23/2023 1855   EOSABS 0.0 02/23/2023 1855   BASOSABS 0.0 02/23/2023 1855    BMET    Component Value Date/Time   NA 138 02/26/2023 0515   K 3.7 02/26/2023 0515   CL 106 02/26/2023 0515   CO2 24 02/26/2023 0515   GLUCOSE 116 (H) 02/26/2023 0515   BUN 27 (H) 02/26/2023 0515   CREATININE 0.92 02/26/2023 0515   CREATININE 0.81 06/27/2013 1148   CALCIUM 8.7 (L) 02/26/2023 0515   GFRNONAA 59 (L) 02/26/2023 0515    IMAGING past 24 hours No results found.   Vitals:   02/26/23 1000 02/26/23 1100 02/26/23 1200 02/26/23 1224  BP: 117/72 (!) 131/110 (!) 160/121 (!) 146/89  Pulse: 71 72 74 77  Resp: 20 17 19 18   Temp:   98.1 F (36.7 C)   TempSrc:   Oral   SpO2: 98% 100% 99% 100%  Weight:         PHYSICAL EXAM General:  Alert, well-nourished, well-developed patient in no acute distress Psych:  Mood and affect appropriate for situation CV: Regular rate and  rhythm on monitor Respiratory:  Regular, unlabored respirations on room air  NEURO:  Pt awake, alert, eyes open, orientated to people only but not to age, place, or time. Able to say short sentences but not full sentences. Able to follow most simple commands. Able to name 2/3 but did not repeat. Psychomotor slowing. No gaze palsy, tracking bilaterally, blinking to visual threat bilaterally. No facial droop. Tongue protrusion midline. Bilateral UEs 4/5, no drift. Bilaterally LEs 3/5, no drift. Sensation and coordination not quite cooperative, gait not tested.   ASSESSMENT/PLAN  ICH: multifocal ICH at left temporal, frontal and frontoparietal convexity, etiology traumatic vs. CAA CT head 3.3 cm left temporal ICH with small amount of overlying subarachnoid blood Repeat Head CT 12/24 multifocal left hemisphere acute hemorrhagic contusions, stable small volume left subarachnoid hemorrhage, small volume layering intraventricular hemorrhage with no midline shift or signs of hydrocephalus MRI posttraumatic IPH in left temporal and frontal region with mild surrounding edema and small amount of IVH, no significant volume of subarachnoid blood, 1 mm thick subdural blood along left lateral convexity MRA no LVO or proximal stenosis Carotid Doppler unremarkable 2D Echo EF 65-70% LDL 59 HgbA1c 6.4 UDS neg VTE prophylaxis -SCDs No antithrombotic prior to admission, now on No antithrombotic due to ICH and CAA Therapy recommendations:  CIR Disposition: pending  Acute  ischemic stroke, incidental finding - left frontal white matter punctate stroke MRI showed punctate acute ischemic stroke in left frontal white matter Likely from small vessel disease, ischemic WM changes and CAA No antithrombotics due to ICH and CAA  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 MRI showed old hemorrhagic stroke in medial right parieto-occipital and right PCA region, chronic small vessel ischemic changes and innumerable punctate foci of hemosiderin  deposition likely represent CAA No antithrombotics  Frequent PACs Tele initially concerning for afib EKG showed SR with frequent PACs and I degree AVB Continue monitoring  Hypertension Home meds: Losartan 25 mg daily Stable As needed labetalol and hydralazine Long term BP goal < 140 given CAA  Hyperlipidemia Home meds: Simvastatin 10 mg daily LDL 59, goal < 70 Resume statin at discharge Not SATURN candidate given possible traumatic ICH  Other Stroke Risk Factors Advanced age Migraine   Other Active Problems Mild dementia - resume home Aricept Leukocytosis resolved, WBC 16.4->12.9->11.9->9.0  Hospital day # 3  Neurology will sign off. Please call with questions. Pt will follow up with [facility] NP at [facility] in about 4 weeks. Thanks for the consult.   Marvel Plan, MD PhD Stroke Neurology 02/26/2023 1:52 PM    To contact Stroke Continuity provider, please refer to [email or web address]. After hours, contact General Neurology

## 2023-02-26 NOTE — Progress Notes (Signed)
PROGRESS NOTE    Christine Henderson  XLK:[id number] DOB: [birth year] DOA: 02/23/2023 PCP: Bradd Canary, MD   Brief Narrative: KATILIN IM is a 89 y.o. female with a history of primary hypertension, peripheral neuropathy, hyperlipidemia, dementia.  Patient presented secondary to being found down and found to have evidence of an intracerebral hemorrhage with incidental finding of an acute ischemic stroke. Patient admitted by neurology to ICU. Neurosurgery consulted with recommendations for no neurosurgical intervention. Patient managed off of antithrombotic therapy secondary to ICH in addition to concern for CAA diagnosis. Physical therapy recommending SNF on discharge.   Assessment and Plan:  Intracerebral hemorrhage Multifocal located at left temporal, frontal and frontoparietal convexity. Per neurology, etiology is either traumatic versus cerebral amyloid angiopathy. Patient admitted to ICU on admission by stroke team. CT head (12/23) significant for a 3.3 cm left temporal intracerebral hemorrhage with small amount of subarachnoid blood without mass effect or midline shift; repeat CT head (12/24) significant for multifocal left hemisphere acute hemorrhagic contusions described as stable. Neurosurgery consulted and recommended no surgical management. MRI brain significant for a likely incidental acute small vessel infarct in the left frontal white mater, in addition to innumerable punctate foci of hemosiderin deposition concerning for chronic hypertensive small-vessel disease vs amyloid angiopathy. LDL of 59. Hemoglobin A1C of 6.4%. Transthoracic Echocardiogram significant for a normal LVEF of 65-70% with no cardiac source for embolism noted and atrial septum noted to be grossly normal. Neurology recommendations for no antithrombotic therapy secondary to ICH and CAA. PT/OT recommendations for SNF.  Acute ischemic stroke Noted incidentally on MRI. No antithrombotic therapy secondary to ICA and  CAA.  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 In differential diagnosis per neurology. Recommendation for no antithrombotics.  Frequent PACs Noted on telemetry. Asymptomatic.  Primary hypertension Goal SBP < 140 mmHg per neurology recommendations secondary to CAA. Uncontrolled currently. Patient is on losartan as an outpatient which was held on admission. -Resume home losartan 12.5 mg daily  Diastolic heart dysfunction Patient noted to have grade I diastolic dysfunction on Transthoracic Echocardiogram. No clinical evidence of heart failure.  Hyperlipidemia Well controlled with LDL of 59. Patient is on simvastatin and omega 3 fatty acid as an outpatient. -Resume simvastatin  Dementia -Continue Aricept  Pressure injury Right/lateral hip. Present on admission.   DVT prophylaxis: SCDs Code Status:   Code Status: Full Code Family Communication: Son via telephone Disposition Plan: Discharge to SNF pending bed availability and insurance authorization. Medically stable for discharge.   Consultants:  Neurology/stroke Neurosurgery  Procedures:  12/24: Transthoracic Echocardiogram   Antimicrobials: None    Subjective: Patient reports no concerns today.  Objective: BP (!) 152/89   Pulse 63   Temp 98.3 F (36.8 C) (Oral)   Resp (!) 21   Wt 54.3 kg   SpO2 94%   BMI 21.90 kg/m   Examination:  General exam: Appears calm and comfortable Respiratory system: Clear to auscultation. Respiratory effort normal. Cardiovascular system: S1 & S2 heard, RRR. Gastrointestinal system: Abdomen is nondistended, soft and nontender. Normal bowel sounds heard. Central nervous system: Alert and oriented to person only. Musculoskeletal: No edema. No calf tenderness   Data Reviewed: I have personally reviewed following labs and imaging studies  CBC Lab Results  Component Value Date   WBC 9.0 02/26/2023   RBC 4.00 02/26/2023   HGB 12.6 02/26/2023   HCT 37.8 02/26/2023   MCV 94.5 02/26/2023   MCH 31.5  02/26/2023   PLT 256 02/26/2023   MCHC 33.3 02/26/2023  RDW 15.3 02/26/2023   LYMPHSABS 0.7 02/23/2023   MONOABS 0.6 02/23/2023   EOSABS 0.0 02/23/2023   BASOSABS 0.0 02/23/2023     Last metabolic panel Lab Results  Component Value Date   NA 138 02/26/2023   K 3.7 02/26/2023   CL 106 02/26/2023   CO2 24 02/26/2023   BUN 27 (H) 02/26/2023   CREATININE 0.92 02/26/2023   GLUCOSE 116 (H) 02/26/2023   GFRNONAA 59 (L) 02/26/2023   GFRAA 41 (L) 06/07/2018   CALCIUM 8.7 (L) 02/26/2023   PHOS 3.6 12/30/2013   PROT 7.4 02/23/2023   ALBUMIN 3.8 02/23/2023   BILITOT 2.7 (H) 02/23/2023   ALKPHOS 65 02/23/2023   AST 68 (H) 02/23/2023   ALT 41 02/23/2023   ANIONGAP 8 02/26/2023    GFR: Estimated Creatinine Clearance: 32.1 mL/min (by C-G formula based on SCr of 0.92 mg/dL).  Recent Results (from the past 240 hours)  MRSA Next Gen by PCR, Nasal     Status: None   Collection Time: 02/23/23 11:37 PM   Specimen: Nasal Mucosa; Nasal Swab  Result Value Ref Range Status   MRSA by PCR Next Gen NOT DETECTED NOT DETECTED Final    Comment: (NOTE) The GeneXpert MRSA Assay (FDA approved for NASAL specimens only), is one component of a comprehensive MRSA colonization surveillance program. It is not intended to diagnose MRSA infection nor to guide or monitor treatment for MRSA infections. Test performance is not FDA approved in patients less than 25 years old. Performed at [facility], [address], [place], [place] [address]       Radiology Studies: VAS US CAROTID Result Date: 02/25/2023 Carotid Arterial Duplex Study Patient Name:  Christine Henderson  Date of Exam:   02/24/2023 Medical Rec #: [id number]        Accession #:    [id number] Date of Birth: 09-May-1930        Patient Gender: F Patient Age:   68 years Exam Location:  [facility] Procedure:      VAS US CAROTID Referring Phys: Marvel Plan --------------------------------------------------------------------------------   Indications:  CVA. Risk Factors: Hypertension, hyperlipidemia, prior CVA. Performing Technologist: Shona Simpson  Examination Guidelines: A complete evaluation includes B-mode imaging, spectral Doppler, color Doppler, and power Doppler as needed of all accessible portions of each vessel. Bilateral testing is considered an integral part of a complete examination. Limited examinations for reoccurring indications may be performed as noted.  Right Carotid Findings: +----------+--------+--------+--------+------------------+--------+           PSV cm/sEDV cm/sStenosisPlaque DescriptionComments +----------+--------+--------+--------+------------------+--------+ CCA Prox  60      11                                         +----------+--------+--------+--------+------------------+--------+ CCA Distal74      14                                         +----------+--------+--------+--------+------------------+--------+ ICA Prox  55      14      1-39%   homogeneous                +----------+--------+--------+--------+------------------+--------+ ICA Distal57      15                                         +----------+--------+--------+--------+------------------+--------+  ECA       69      10                                         +----------+--------+--------+--------+------------------+--------+ +----------+--------+-------+--------+-------------------+           PSV cm/sEDV cmsDescribeArm Pressure (mmHG) +----------+--------+-------+--------+-------------------+ Subclavian106     0                                  +----------+--------+-------+--------+-------------------+ +---------+--------+--+--------+-+ VertebralPSV cm/s25EDV cm/s6 +---------+--------+--+--------+-+  Left Carotid Findings: +----------+--------+--------+--------+------------------+------------------+           PSV cm/sEDV cm/sStenosisPlaque DescriptionComments            +----------+--------+--------+--------+------------------+------------------+ CCA Prox  63      11                                                   +----------+--------+--------+--------+------------------+------------------+ CCA Distal67      14                                intimal thickening +----------+--------+--------+--------+------------------+------------------+ ICA Prox  48      11      1-39%   heterogenous                         +----------+--------+--------+--------+------------------+------------------+ ICA Distal70      20                                                   +----------+--------+--------+--------+------------------+------------------+ ECA       67      9                                                    +----------+--------+--------+--------+------------------+------------------+ +----------+--------+--------+--------+-------------------+           PSV cm/sEDV cm/sDescribeArm Pressure (mmHG) +----------+--------+--------+--------+-------------------+ [id number]      0                                   +----------+--------+--------+--------+-------------------+ +---------+--------+--+--------+-+ VertebralPSV cm/s43EDV cm/s9 +---------+--------+--+--------+-+   Summary: Right Carotid: Velocities in the right ICA are consistent with a 1-39% stenosis. Left Carotid: Velocities in the left ICA are consistent with a 1-39% stenosis. Vertebrals:  Bilateral vertebral arteries demonstrate antegrade flow. Subclavians: Normal flow hemodynamics were seen in bilateral subclavian              arteries. *See table(s) above for measurements and observations.  Electronically signed by Gerarda Fraction on 02/25/2023 at 11:53:41 AM.    Final    ECHOCARDIOGRAM COMPLETE Result Date: 02/24/2023    ECHOCARDIOGRAM REPORT   Patient Name:   ANGLE DOTO Date of Exam: 02/24/2023 Medical Rec #:  [id number]  Height:       62.0 in Accession #:     [id number]      Weight:       119.7 lb Date of Birth:  1931-01-30       BSA:          1.537 m Patient Age:    89 years        BP:           124/52 mmHg Patient Gender: F               HR:           77 bpm. Exam Location:  Inpatient Procedure: 2D Echo, Cardiac Doppler, Color Doppler and Intracardiac            Opacification Agent Indications:    Stroke  History:        Patient has no prior history of Echocardiogram examinations.                 Risk Factors:Current Smoker.  Sonographer:    Karma Ganja Referring Phys: [id number] ASHISH ARORA  Sonographer Comments: Technically difficult study due to poor echo windows and suboptimal subcostal window. Image acquisition challenging due to patient body habitus. IMPRESSIONS  1. Left ventricular ejection fraction, by estimation, is 65 to 70%. The left ventricle has normal function. The left ventricle has no regional wall motion abnormalities. There is mild concentric left ventricular hypertrophy. Left ventricular diastolic parameters are consistent with Grade I diastolic dysfunction (impaired relaxation).  2. Right ventricular systolic function is normal. The right ventricular size is dilated. There is normal pulmonary artery systolic pressure. The estimated right ventricular systolic pressure is 23.4 mmHg.  3. The mitral valve is degenerative. Trivial mitral valve regurgitation. No evidence of mitral stenosis.  4. The aortic valve is tricuspid. There is mild calcification of the aortic valve. Aortic valve regurgitation is not visualized. Aortic valve sclerosis is present, with no evidence of aortic valve stenosis. Comparison(s): No significant change from prior study. Prior images reviewed side by side. FINDINGS  Left Ventricle: Left ventricular ejection fraction, by estimation, is 65 to 70%. The left ventricle has normal function. The left ventricle has no regional wall motion abnormalities. Definity contrast agent was given IV to delineate the left ventricular  endocardial  borders. The left ventricular internal cavity size was normal in size. There is mild concentric left ventricular hypertrophy. Left ventricular diastolic parameters are consistent with Grade I diastolic dysfunction (impaired relaxation). Right Ventricle: [name] Kitchen The right ventricular size is dilated. No increase in right ventricular wall thickness. Right ventricular systolic function is normal. There is normal pulmonary artery systolic pressure. The tricuspid regurgitant velocity is 2.26 m/s, and with an assumed right atrial pressure of 3 mmHg, the estimated right ventricular systolic pressure is 23.4 mmHg. Left Atrium: Left atrial size was normal in size. Right Atrium: Right atrial size was normal in size. Pericardium: Trivial pericardial effusion is present. Mitral Valve: The mitral valve is degenerative in appearance. Mild mitral annular calcification. Trivial mitral valve regurgitation. No evidence of mitral valve stenosis. Tricuspid Valve: The tricuspid valve is normal in structure. Tricuspid valve regurgitation is trivial. Aortic Valve: The aortic valve is tricuspid. There is mild calcification of the aortic valve. There is mild aortic valve annular calcification. Aortic valve regurgitation is not visualized. Aortic valve sclerosis is present, with no evidence of aortic valve stenosis. Aortic valve mean gradient measures 4.0 mmHg. Aortic valve peak gradient measures 7.4 mmHg. Aortic valve area, by  VTI measures 2.71 cm. Pulmonic Valve: The pulmonic valve was normal in structure. Pulmonic valve regurgitation is not visualized. No evidence of pulmonic stenosis. Aorta: The ascending aorta was not well visualized and the aortic root, ascending aorta and aortic arch are all structurally normal, with no evidence of dilitation or obstruction. Venous: The inferior vena cava was not well visualized. IAS/Shunts: The atrial septum is grossly normal.  LEFT VENTRICLE PLAX 2D LVIDd:         3.60 cm   Diastology LVIDs:         2.20  cm   LV e' medial:    5.44 cm/s LV PW:         1.00 cm   LV E/e' medial:  12.8 LV IVS:        1.00 cm   LV e' lateral:   9.01 cm/s LVOT diam:     2.00 cm   LV E/e' lateral: 7.7 LV SV:         68 LV SV Index:   45 LVOT Area:     3.14 cm  RIGHT VENTRICLE RV S prime:     12.60 cm/s TAPSE (M-mode): 1.6 cm LEFT ATRIUM             Index LA diam:        3.60 cm 2.34 cm/m LA Vol (A2C):   57.1 ml 37.15 ml/m LA Vol (A4C):   43.2 ml 28.11 ml/m LA Biplane Vol: 53.1 ml 34.55 ml/m  AORTIC VALVE AV Area (Vmax):    2.52 cm AV Area (Vmean):   2.46 cm AV Area (VTI):     2.71 cm AV Vmax:           136.00 cm/s AV Vmean:          91.700 cm/s AV VTI:            0.253 m AV Peak Grad:      7.4 mmHg AV Mean Grad:      4.0 mmHg LVOT Vmax:         109.00 cm/s LVOT Vmean:        71.800 cm/s LVOT VTI:          0.218 m LVOT/AV VTI ratio: 0.86  AORTA Ao Root diam: 3.50 cm Ao Asc diam:  3.60 cm MITRAL VALVE               TRICUSPID VALVE MV Area (PHT): 3.27 cm    TR Peak grad:   20.4 mmHg MV Decel Time: 232 msec    TR Vmax:        226.00 cm/s MV E velocity: 69.60 cm/s MV A velocity: 70.30 cm/s  SHUNTS MV E/A ratio:  0.99        Systemic VTI:  0.22 m                            Systemic Diam: 2.00 cm Riley Lam MD Electronically signed by Riley Lam MD Signature Date/Time: 02/24/2023/2:30:38 PM    Final    MR ANGIO HEAD WO CONTRAST Result Date: 02/24/2023 CLINICAL DATA:  Stroke, hemorrhagic. Disequilibrium. Fall with intracranial hemorrhage. Multiple hemorrhagic contusions on the left. EXAM: MRI HEAD WITHOUT AND WITH CONTRAST MRA HEAD WITHOUT CONTRAST TECHNIQUE: Multiplanar, multi-echo pulse sequences of the brain and surrounding structures were acquired without and with intravenous contrast. Angiographic images of the Circle of Willis were acquired using MRA technique without intravenous contrast. CONTRAST:  5mL GADAVIST GADOBUTROL 1 MMOL/ML IV SOLN COMPARISON:  CT studies earlier same day. FINDINGS: MRI HEAD  FINDINGS Brain: Punctate focus of restricted diffusion in the left frontal white matter likely to represent an incidental acute small vessel infarction. No focal abnormality affects the brainstem or cerebellum. There is a background pattern of advanced chronic small-vessel ischemic change of the white matter, with innumerable punctate foci of hemosiderin deposition which could be due to chronic hypertensive small-vessel disease or amyloid angiopathy. Post traumatic intraparenchymal hematomas remain visible in the left temporal and frontal region with mild surrounding edema. Old hemorrhagic stroke seen in the medial right parietooccipital junction region. Small amount of blood layering dependently within the occipital horns of the lateral ventricles. No significant volume of subarachnoid blood is suggested by MRI. Ventricular size remains stable. No evidence of mass lesion. Very tiny amount of subdural blood along the lateral convexity on the left, 1 mm in thickness. No midline shift. Vascular: Major vessels at the base of the brain show flow. Skull and upper cervical spine: Negative Sinuses/Orbits: Clear/normal Other: None MRA HEAD FINDINGS Anterior circulation: Both internal carotid arteries are patent through the skull base and siphon regions. The anterior and middle cerebral vessels are patent. No large vessel occlusion or proximal stenosis. Distal vessels show atherosclerotic narrowing and irregularity. Posterior circulation: Both vertebral arteries patent to the basilar artery. No basilar stenosis. Posterior circulation branch vessels show flow. Some atherosclerotic irregularity of the more distal PCA branch vessels. Anatomic variants: None significant. MRA HEAD FINDINGS IMPRESSION: 1. Punctate focus of restricted diffusion in the left frontal white matter likely to represent an incidental acute small vessel infarction. 2. Post traumatic intraparenchymal hematomas remain visible in the left temporal and frontal  region with mild surrounding edema. Small amount of blood layering dependently within the occipital horns of the lateral ventricles. No significant volume of subarachnoid blood is suggested by MRI. Ventricular size remains stable. 3. Very tiny amount of subdural blood along the lateral convexity on the left, 1 mm in thickness. 4. Old hemorrhagic stroke in the medial right parietooccipital junction region. 5. Background pattern of advanced chronic small-vessel ischemic change of the white matter, with innumerable punctate foci of hemosiderin deposition which could be due to chronic hypertensive small-vessel disease or amyloid angiopathy. 6. No large vessel occlusion or proximal stenosis. Distal vessels show atherosclerotic narrowing and irregularity. Electronically Signed   By: Paulina Fusi M.D.   On: 02/24/2023 11:42   MR BRAIN W WO CONTRAST Result Date: 02/24/2023 CLINICAL DATA:  Stroke, hemorrhagic. Disequilibrium. Fall with intracranial hemorrhage. Multiple hemorrhagic contusions on the left. EXAM: MRI HEAD WITHOUT AND WITH CONTRAST MRA HEAD WITHOUT CONTRAST TECHNIQUE: Multiplanar, multi-echo pulse sequences of the brain and surrounding structures were acquired without and with intravenous contrast. Angiographic images of the Circle of Willis were acquired using MRA technique without intravenous contrast. CONTRAST:  5mL GADAVIST GADOBUTROL 1 MMOL/ML IV SOLN COMPARISON:  CT studies earlier same day. FINDINGS: MRI HEAD FINDINGS Brain: Punctate focus of restricted diffusion in the left frontal white matter likely to represent an incidental acute small vessel infarction. No focal abnormality affects the brainstem or cerebellum. There is a background pattern of advanced chronic small-vessel ischemic change of the white matter, with innumerable punctate foci of hemosiderin deposition which could be due to chronic hypertensive small-vessel disease or amyloid angiopathy. Post traumatic intraparenchymal hematomas  remain visible in the left temporal and frontal region with mild surrounding edema. Old hemorrhagic stroke seen in the medial right parietooccipital junction  region. Small amount of blood layering dependently within the occipital horns of the lateral ventricles. No significant volume of subarachnoid blood is suggested by MRI. Ventricular size remains stable. No evidence of mass lesion. Very tiny amount of subdural blood along the lateral convexity on the left, 1 mm in thickness. No midline shift. Vascular: Major vessels at the base of the brain show flow. Skull and upper cervical spine: Negative Sinuses/Orbits: Clear/normal Other: None MRA HEAD FINDINGS Anterior circulation: Both internal carotid arteries are patent through the skull base and siphon regions. The anterior and middle cerebral vessels are patent. No large vessel occlusion or proximal stenosis. Distal vessels show atherosclerotic narrowing and irregularity. Posterior circulation: Both vertebral arteries patent to the basilar artery. No basilar stenosis. Posterior circulation branch vessels show flow. Some atherosclerotic irregularity of the more distal PCA branch vessels. Anatomic variants: None significant. MRA HEAD FINDINGS IMPRESSION: 1. Punctate focus of restricted diffusion in the left frontal white matter likely to represent an incidental acute small vessel infarction. 2. Post traumatic intraparenchymal hematomas remain visible in the left temporal and frontal region with mild surrounding edema. Small amount of blood layering dependently within the occipital horns of the lateral ventricles. No significant volume of subarachnoid blood is suggested by MRI. Ventricular size remains stable. 3. Very tiny amount of subdural blood along the lateral convexity on the left, 1 mm in thickness. 4. Old hemorrhagic stroke in the medial right parietooccipital junction region. 5. Background pattern of advanced chronic small-vessel ischemic change of the white  matter, with innumerable punctate foci of hemosiderin deposition which could be due to chronic hypertensive small-vessel disease or amyloid angiopathy. 6. No large vessel occlusion or proximal stenosis. Distal vessels show atherosclerotic narrowing and irregularity. Electronically Signed   By: Paulina Fusi M.D.   On: 02/24/2023 11:42      LOS: 3 days    Jacquelin Hawking, MD [facility] 02/26/2023, 6:43 AM   If 7PM-7AM, please contact night-coverage [email or web address]

## 2023-02-27 DIAGNOSIS — I619 Nontraumatic intracerebral hemorrhage, unspecified: Secondary | ICD-10-CM | POA: Diagnosis not present

## 2023-02-27 LAB — CBC
HCT: 39.2 % (ref 36.0–46.0)
Hemoglobin: 13.1 g/dL (ref 12.0–15.0)
MCH: 31.6 pg (ref 26.0–34.0)
MCHC: 33.4 g/dL (ref 30.0–36.0)
MCV: 94.5 fL (ref 80.0–100.0)
Platelets: 282 10*3/uL (ref 150–400)
RBC: 4.15 MIL/uL (ref 3.87–5.11)
RDW: 15.2 % (ref 11.5–15.5)
WBC: 10.4 10*3/uL (ref 4.0–10.5)
nRBC: 0 % (ref 0.0–0.2)

## 2023-02-27 LAB — BASIC METABOLIC PANEL
Anion gap: 10 (ref 5–15)
BUN: 16 mg/dL (ref 8–23)
CO2: 22 mmol/L (ref 22–32)
Calcium: 8.7 mg/dL — ABNORMAL LOW (ref 8.9–10.3)
Chloride: 104 mmol/L (ref 98–111)
Creatinine, Ser: 0.9 mg/dL (ref 0.44–1.00)
GFR, Estimated: >60 mL/min (ref >60)
Glucose, Bld: 123 mg/dL — ABNORMAL HIGH (ref 70–99)
Potassium: 3.5 mmol/L (ref 3.5–5.1)
Sodium: 136 mmol/L (ref 135–145)

## 2023-02-27 MED ORDER — POTASSIUM CHLORIDE CRYS ER 20 MEQ PO TBCR
40.0000 meq | EXTENDED_RELEASE_TABLET | Freq: Once | ORAL | Status: AC
  Administered 2023-02-27: 40 meq via ORAL
  Filled 2023-02-27: qty 2

## 2023-02-27 NOTE — Progress Notes (Signed)
Physical Therapy Treatment Patient Details Name: Christine Henderson MRN: [id number] DOB: [birth year] Today's Date: 02/27/2023   History of Present Illness 89 yo female presents to [facility] on 12/23 with fall at home. CTH reveals multifocal L hemisphere acute hemorrhagic contusions, largest is 3.5 cm L temporal with small amount of overlying subarachnoid blood and IVH. MRI punctate acute ischemic stroke in left frontal white matter.  PMH includes HLD, HTN, peripheral neuropathy, osteopenia, recurrent falls, disequilibrium, IBS, mild dementia.    PT Comments  Pt was alert, agreeable to OOB, steadily progressing toward goals.  Emphasis on following simple instruction, initiation before assisting.  Transitions, safe sit to stand technique and progression of gait stability and stamina.     If plan is discharge home, recommend the following: A little help with walking and/or transfers;A little help with bathing/dressing/bathroom;Assistance with cooking/housework;Assist for transportation;Help with stairs or ramp for entrance   Can travel by private vehicle     Yes  Equipment Recommendations  None recommended by PT    Recommendations for Other Services Rehab consult     Precautions / Restrictions Precautions Precautions: Fall Precaution Comments: SBP< 160 Restrictions Weight Bearing Restrictions Per Provider Order: No     Mobility  Bed Mobility Overal bed mobility: Needs Assistance Bed Mobility: Supine to Sit     Supine to sit: Contact guard     General bed mobility comments: cues for direction, stating in different ways to help her understand    Transfers Overall transfer level: Needs assistance Equipment used: Rolling walker (2 wheels) Transfers: Sit to/from Stand Sit to Stand: Min assist   Step pivot transfers: Mod assist       General transfer comment: cues for hand placement and initiation to EOB.  forward and steady assist.    Ambulation/Gait Ambulation/Gait assistance:  Min assist Gait Distance (Feet): 150 Feet Assistive device: Rolling walker (2 wheels) Gait Pattern/deviations: Step-through pattern   Gait velocity interpretation: <1.31 ft/sec, indicative of household ambulator   General Gait Details: As with eval, mildly unsteady with need for stability assist, help maneuvering the RW away from obstacles, cuing for backing up to the chair and toilet.  Unlike yesterday, has much more stamina and a little more focus on task.   Stairs             Wheelchair Mobility     Tilt Bed    Modified Rankin (Stroke Patients Only) Modified Rankin (Stroke Patients Only) Pre-Morbid Rankin Score: Moderate disability Modified Rankin: Moderately severe disability     Balance   Sitting-balance support: No upper extremity supported, Bilateral upper extremity supported, Feet supported Sitting balance-Leahy Scale: Good     Standing balance support: Bilateral upper extremity supported, During functional activity Standing balance-Leahy Scale: Poor Standing balance comment: reliant on RW and minor external assist                            Cognition Arousal: Alert Behavior During Therapy: WFL for tasks assessed/performed Overall Cognitive Status: History of cognitive impairments - at baseline                   Orientation Level: Disoriented to, Place, Time, Situation Current Attention Level: Sustained Memory: Decreased short-term memory Following Commands: Follows one step commands with increased time (and repetition) Safety/Judgement: Decreased awareness of deficits, Decreased awareness of safety   Problem Solving: Slow processing          Exercises  General Comments        Pertinent Vitals/Pain Pain Assessment Pain Assessment: Faces Faces Pain Scale: No hurt Pain Intervention(s): Monitored during session    Home Living                          Prior Function            PT Goals (current goals  can now be found in the care plan section) Acute Rehab PT Goals PT Goal Formulation: With patient/family Time For Goal Achievement: 03/10/23 Potential to Achieve Goals: Fair Progress towards PT goals: Progressing toward goals    Frequency    Min 1X/week      PT Plan      Co-evaluation              AM-PAC PT "6 Clicks" Mobility   Outcome Measure  Help needed turning from your back to your side while in a flat bed without using bedrails?: A Little Help needed moving from lying on your back to sitting on the side of a flat bed without using bedrails?: A Little Help needed moving to and from a bed to a chair (including a wheelchair)?: A Little Help needed standing up from a chair using your arms (e.g., wheelchair or bedside chair)?: A Little Help needed to walk in hospital room?: A Little Help needed climbing 3-5 steps with a railing? : Total 6 Click Score: 16    End of Session   Activity Tolerance: Patient tolerated treatment well Patient left: in chair;with call bell/phone within reach;with chair alarm set;with family/visitor present Nurse Communication: Mobility status PT Visit Diagnosis: Other abnormalities of gait and mobility (R26.89);Muscle weakness (generalized) (M62.81);Other symptoms and signs involving the nervous system (R29.898)     Time: 1110-1135 PT Time Calculation (min) (ACUTE ONLY): 25 min  Charges:    $Gait Training: 8-22 mins $Therapeutic Activity: 8-22 mins PT General Charges $$ ACUTE PT VISIT: 1 Visit                     02/27/2023  Jacinto Halim., PT Acute Rehabilitation Services [phone]  (office)   [name] 02/27/2023, 11:47 AM

## 2023-02-27 NOTE — Progress Notes (Addendum)
IP rehab admissions - Please see consult done by Dr. Riley Kill today.  I called patient's son and he would like CIR with discharge after CIR to ALF.  He is in contact with ALF today.  I will have my partner follow up on Monday for plans. I have not opened the case yet with insurance carrier.   [phone]

## 2023-02-27 NOTE — [place] FL2 (Signed)
[place] MEDICAID FL2 LEVEL OF CARE FORM     IDENTIFICATION  Patient Name: Christine Henderson Birthdate: November 02, 1930 Sex: female Admission Date (Current Location): 02/23/2023  [facility] and [place] Number:  Producer, television/film/video and Address:  [facility], [address] [place], [place] [address]      Provider Number: [id number]  Attending Physician Name and Address:  Lanae Boast, MD  Relative Name and Phone Number:       Current Level of Care: Hospital Recommended Level of Care: Skilled Nursing Facility Prior Approval Number:    Date Approved/Denied:   PASRR Number: [id number]  Discharge Plan: SNF    Current Diagnoses: Patient Active Problem List   Diagnosis Date Noted   Cerebral amyloid angiopathy (HCC) 02/26/2023   Pressure injury of skin 02/26/2023   Hemorrhagic stroke (HCC) 02/23/2023   Mild dementia 09/10/2022   Edema 08/14/2022   Renal insufficiency 08/14/2022   Recurrent falls 01/30/2022   Disequilibrium 10/25/2021   Heart murmur 10/25/2021   Vitamin D deficiency 07/14/2016   Hereditary and idiopathic peripheral neuropathy 01/10/2016   Paresthesia of foot, bilateral 07/09/2014   Hyperglycemia 07/09/2014   Onychomycosis 07/09/2014   Lumbar back pain with radiculopathy affecting left lower extremity 11/13/2013   Back pain 06/27/2013   Insomnia 11/14/2012   Osteopenia 11/12/2012   Vasomotor rhinitis 02/02/2008   Essential hypertension 10/06/2007   Hyperlipidemia 10/01/2006   Postmenopausal atrophic vaginitis 10/01/2006   Irritable bowel syndrome 09/10/2006   Disorder of bone and cartilage 09/10/2006    Orientation RESPIRATION BLADDER Height & Weight     Self  Normal Continent Weight: 119 lb 11.4 oz (54.3 kg) Height:     BEHAVIORAL SYMPTOMS/MOOD NEUROLOGICAL BOWEL NUTRITION STATUS      Continent Diet (See dc summary)  AMBULATORY STATUS COMMUNICATION OF NEEDS Skin   Limited Assist Verbally PU Stage and Appropriate Care, Skin  abrasions (Stage I on hip; shoulder laceration)                       Personal Care Assistance Level of Assistance  Bathing, Feeding, Dressing Bathing Assistance: Limited assistance Feeding assistance: Limited assistance Dressing Assistance: Limited assistance     Functional Limitations Info             SPECIAL CARE FACTORS FREQUENCY  PT (By licensed PT), OT (By licensed OT)     PT Frequency: 5x/week OT Frequency: 5x/week            Contractures Contractures Info: Not present    Additional Factors Info  Code Status, Allergies Code Status Info: Full Allergies Info: Codeine           Current Medications (02/27/2023):  This is the current hospital active medication list Current Facility-Administered Medications  Medication Dose Route Frequency Provider Last Rate Last Admin   acetaminophen (TYLENOL) tablet 650 mg  650 mg Oral Q4H PRN Milon Dikes, MD   650 mg at 02/26/23 1840   Or   acetaminophen (TYLENOL) 160 MG/5ML solution 650 mg  650 mg Per Tube Q4H PRN Milon Dikes, MD       Or   acetaminophen (TYLENOL) suppository 650 mg  650 mg Rectal Q4H PRN Milon Dikes, MD       Chlorhexidine Gluconate Cloth 2 % PADS 6 each  6 each Topical Daily Milon Dikes, MD   6 each at 02/27/23 0923   donepezil (ARICEPT) tablet 10 mg  10 mg Oral QHS de Saintclair Halsted,  Cortney E, NP   10 mg at 02/26/23 2228   heparin injection 5,000 Units  5,000 Units Subcutaneous Q12H Marvel Plan, MD   5,000 Units at 02/27/23 9147   hydrALAZINE (APRESOLINE) injection 10 mg  10 mg Intravenous Q4H PRN de Saintclair Halsted, Cortney E, NP   10 mg at 02/26/23 1544   labetalol (NORMODYNE) injection 10 mg  10 mg Intravenous Q2H PRN de Saintclair Halsted, Cortney E, NP   10 mg at 02/26/23 8295   losartan (COZAAR) tablet 25 mg  25 mg Oral Daily Narda Bonds, MD   25 mg at 02/27/23 6213   Oral care mouth rinse  15 mL Mouth Rinse PRN Milon Dikes, MD       pantoprazole (PROTONIX) EC tablet 40 mg  40 mg Oral QHS Marvel Plan,  MD   40 mg at 02/26/23 2229   senna-docusate (Senokot-S) tablet 1 tablet  1 tablet Oral BID Milon Dikes, MD   1 tablet at 02/26/23 2229   simvastatin (ZOCOR) tablet 10 mg  10 mg Oral QHS Narda Bonds, MD   10 mg at 02/26/23 2229     Discharge Medications: Please see discharge summary for a list of discharge medications.  Relevant Imaging Results:  Relevant Lab Results:   Additional Information SSN: [id number] [place], [place]

## 2023-02-27 NOTE — Consult Note (Signed)
Physical Medicine and Rehabilitation Consult Reason for Consult:left temporal hemorrhage Referring Physician: Dayna Barker   HPI: Christine Henderson is a 89 y.o. female with a history of hypertension, peripheral neuropathy, IBS, migraines, mild dementia, chronic kidney disease who presented after being found down at home on 02/23/2023.  She was incoherent.  CT of the head demonstrated a left temporal intracerebral hemorrhage with small amount of overlying subarachnoid blood.  Repeat CT of the head on 02/24/2023 demonstrated multifocal left hemisphere acute hemorrhagic contusions, stable small volume left subarachnoid hemorrhage, small volume layering intraventricular hemorrhage with no midline shift or signs of hydrocephalus.  MRI essentially confirmed the findings but also demonstrated punctate acute ischemic stroke in the left frontal white matter as well as a old hemorrhagic stroke in the medial right parietal occipital and right PCA region since in addition to numerous punctate foci of hemosiderin deposition likely representing amyloid angiopathy.[name] Kitchen  MRA revealed no large vessel occlusion.  Neurology feels the etiology of the event is due to cerebral amyloid angiopathy versus traumatic.  No antithrombotics due to hemorrhage.  The patient was up with physical therapy today and was min assist for transfers and min assist 150 feet with a walker.She saw occupational therapy yesterday and was min to max assist with self-care activities.  In speaking with her son who was present in the room, the patient was independent for ambulation at home.  She often walked without a device.  He reports only 3 falls over the last year plus.  Falls were not increasing by his report.  She has an aide 2 hours in the morning and 2 hours later in the day to help with bathing and some chores around the house.  Patient was able to toilet on her own and generally she dresses herself.  Family checks in on Wednesdays and on the weekends  as well.   Review of Systems  Unable to perform ROS: Mental acuity   Past Medical History:  Diagnosis Date   Abdominal pain 06/26/2008   Back pain 06/27/2013   Neck and low back   Bilateral impacted cerumen 10/21/2020   Disequilibrium 10/25/2021   Edema 08/14/2022   Essential hypertension 10/06/2007   Fibrocystic breast disease    Heart murmur 10/25/2021   Hereditary and idiopathic peripheral neuropathy 01/10/2016   Hyperglycemia 07/09/2014   Hyperlipidemia 10/01/2006   Insomnia 11/14/2012   Irritable bowel syndrome 09/10/2006   Lumbar back pain with radiculopathy affecting left lower extremity 11/13/2013   Metatarsal stress fracture 01/20/2016   Migraine headache 09/10/2006   Mild dementia 09/10/2022   MVP (mitral valve prolapse)    Onychomycosis 07/09/2014   Osteopenia 11/12/2012   Palpitations 09/10/2006   Paresthesia of foot, bilateral 07/09/2014   Postmenopausal atrophic vaginitis 10/01/2006   Recurrent falls 01/30/2022   Renal insufficiency 08/14/2022   Vasomotor rhinitis 02/02/2008   Vitamin D deficiency 07/14/2016   Past Surgical History:  Procedure Laterality Date   DENTAL SURGERY  09/2014   ROTATOR CUFF REPAIR Right    TONSILLECTOMY     Family History  Problem Relation Age of Onset   Alzheimer's disease Mother    Dementia Father        unspecified type; w/advanced age   Inflammatory bowel disease Sister    Alzheimer's disease Sister    Pancreatic cancer Maternal Grandfather        unknown details   Hyperlipidemia Son    Obesity Son    Hyperlipidemia Son    Heart  disease Paternal Uncle    Coronary artery disease Neg Hx    Colon cancer Neg Hx    Stomach cancer Neg Hx    Social History:  reports that she has never smoked. She has never used smokeless tobacco. She reports that she does not drink alcohol and does not use drugs. Allergies:  Allergies  Allergen Reactions   Codeine Nausea And Vomiting   Medications Prior to Admission  Medication Sig  Dispense Refill   Ascorbic Acid (VITAMIN C) 1000 MG tablet Take 1,000 mg by mouth daily.     Calcium Carbonate-Vitamin D (CALCIUM PLUS VITAMIN D PO) Take 1,200 tablets by mouth daily.     cyanocobalamin (VITAMIN B12) 1000 MCG tablet Take 1,000 mcg by mouth daily.     donepezil (ARICEPT) 10 MG tablet TAKE 1 TABLET BY MOUTH DAILY 100 tablet 2   fish oil-omega-3 fatty acids 1000 MG capsule Take 2 g by mouth daily.     Glucosamine-Chondroit-Vit C-Mn (GLUCOSAMINE 1500 COMPLEX) CAPS Take 1,500 capsules by mouth 2 (two) times daily.     losartan (COZAAR) 25 MG tablet TAKE 1/2 TO 1 TABLET BY MOUTH  DAILY 90 tablet 3   Multiple Vitamin (MULTIVITAMIN) tablet Take 1 tablet by mouth daily.     simvastatin (ZOCOR) 10 MG tablet TAKE 1 TABLET BY MOUTH AT  BEDTIME 100 tablet 2    Home: Home Living Family/patient expects to be discharged to:: Private residence Living Arrangements: Alone Available Help at Discharge: Family, Available PRN/intermittently, Personal care attendant Type of Home: House (townhouse) Home Access: Level entry Home Layout: One level Bathroom Shower/Tub: Health visitor: Standard Home Equipment: Cane - single point, Agricultural consultant (2 wheels), Shower seat Additional Comments: has life alert but does not remember to push it when she falls  Lives With: Alone, Other (Comment) (family support)  Functional History: Prior Function Prior Level of Function : Needs assist, History of Falls (last six months)  Cognitive Assist : ADLs (cognitive) Physical Assist : ADLs (physical) Mobility Comments: Independent. Pt states that she doesn't always use her walker at home. ADLs Comments: bathing help, help for meals, gets help from service 3 nights a week, friend comes 3 days a week at lunch and son comes Sunday and Wednesday Functional Status:  Mobility: Bed Mobility Overal bed mobility: Needs Assistance Bed Mobility: Supine to Sit Supine to sit: Contact guard General bed  mobility comments: cues for direction, stating in different ways to help her understand Transfers Overall transfer level: Needs assistance Equipment used: Rolling walker (2 wheels) Transfers: Sit to/from Stand Sit to Stand: Min assist Bed to/from chair/wheelchair/BSC transfer type:: Step pivot Step pivot transfers: Mod assist General transfer comment: cues for hand placement and initiation to EOB.  forward and steady assist. Ambulation/Gait Ambulation/Gait assistance: Min assist Gait Distance (Feet): 150 Feet Assistive device: Rolling walker (2 wheels) Gait Pattern/deviations: Step-through pattern General Gait Details: As with eval, mildly unsteady with need for stability assist, help maneuvering the RW away from obstacles, cuing for backing up to the chair and toilet.  Unlike yesterday, has much more stamina and a little more focus on task. Gait velocity: slow Gait velocity interpretation: <1.31 ft/sec, indicative of household ambulator    ADL: ADL Overall ADL's : Needs assistance/impaired Eating/Feeding: Set up, Sitting Grooming: Wash/dry face, Brushing hair, Set up, Sitting, Wash/dry hands Upper Body Bathing: Set up, Sitting Lower Body Bathing: Moderate assistance, Sit to/from stand Upper Body Dressing : Minimal assistance, Sitting Lower Body Dressing: Maximal assistance, Sit  to/from stand Lower Body Dressing Details (indicate cue type and reason): Able to touch feet with increased effort and pull socks down partially while seated EOB. Toilet Transfer: Moderate assistance, Stand-pivot, Rolling walker (2 wheels) Toileting- Clothing Manipulation and Hygiene: Total assistance, Sit to/from stand  Cognition: Cognition Overall Cognitive Status: History of cognitive impairments - at baseline Arousal/Alertness: Awake/alert Orientation Level: Oriented to person, Disoriented to place, Disoriented to time, Disoriented to situation Attention: Sustained Sustained Attention: Appears  intact Memory: Impaired Memory Impairment: Storage deficit, Retrieval deficit Awareness: Impaired Awareness Impairment: Intellectual impairment Safety/Judgment: Impaired Cognition Arousal: Alert Behavior During Therapy: WFL for tasks assessed/performed Overall Cognitive Status: History of cognitive impairments - at baseline Area of Impairment: Orientation, Memory, Following commands, Safety/judgement, Problem solving Orientation Level: Disoriented to, Place, Time, Situation Current Attention Level: Sustained Memory: Decreased short-term memory Following Commands: Follows one step commands with increased time (and repetition) Safety/Judgement: Decreased awareness of deficits, Decreased awareness of safety Problem Solving: Slow processing General Comments: Able to state her birthday month and day. Unable to state birthday year or how old she was. When provided 2 choices on location, pt was able to select hospital.  Blood pressure 94/77, pulse 94, temperature 98.4 F (36.9 C), temperature source Oral, resp. rate 20, weight 54.3 kg, SpO2 98%. Physical Exam Constitutional:      General: She is not in acute distress. HENT:     Head: Normocephalic and atraumatic.     Right Ear: External ear normal.     Left Ear: External ear normal.     Nose: Nose normal.     Mouth/Throat:     Mouth: Mucous membranes are moist.  Eyes:     Extraocular Movements: Extraocular movements intact.     Pupils: Pupils are equal, round, and reactive to light.  Cardiovascular:     Rate and Rhythm: Normal rate.  Pulmonary:     Effort: Pulmonary effort is normal.  Abdominal:     Palpations: Abdomen is soft.  Musculoskeletal:        General: Normal range of motion.     Cervical back: Normal range of motion.  Skin:    Findings: Bruising present.  Neurological:     Mental Status: She is alert.     Comments: Alert and oriented to self, "January", not ear. Normal insight and awareness. Limited short term memor.  Normal  speech. Some delays in speech, needed extra time to identify my watch. Cranial nerve exam grossly unremarkable. MMT: 4/5 UE and 3-4/5 LE's with motor planning deficits and sequencing. Sensory exam normal for light touch and pain in all 4 limbs. No limb ataxia or cerebellar signs. No abnormal tone appreciated.  [name] Kitchen    Psychiatric:        Mood and Affect: Mood normal.        Behavior: Behavior normal.     Results for orders placed or performed during the hospital encounter of 02/23/23 (from the past 24 hours)  Basic metabolic panel     Status: Abnormal   Collection Time: 02/27/23  6:24 AM  Result Value Ref Range   Sodium 136 135 - 145 mmol/L   Potassium 3.5 3.5 - 5.1 mmol/L   Chloride 104 98 - 111 mmol/L   CO2 22 22 - 32 mmol/L   Glucose, Bld 123 (H) 70 - 99 mg/dL   BUN 16 8 - 23 mg/dL   Creatinine, Ser 5.62 0.44 - 1.00 mg/dL   Calcium 8.7 (L) 8.9 - 10.3 mg/dL   GFR,  Estimated >60 >60 mL/min   Anion gap 10 5 - 15  CBC     Status: None   Collection Time: 02/27/23  6:24 AM  Result Value Ref Range   WBC 10.4 4.0 - 10.5 K/uL   RBC 4.15 3.87 - 5.11 MIL/uL   Hemoglobin 13.1 12.0 - 15.0 g/dL   HCT 82.9 56.2 - 13.0 %   MCV 94.5 80.0 - 100.0 fL   MCH 31.6 26.0 - 34.0 pg   MCHC 33.4 30.0 - 36.0 g/dL   RDW 86.5 78.4 - 69.6 %   Platelets 282 150 - 400 K/uL   nRBC 0.0 0.0 - 0.2 %   No results found.  Assessment/Plan: Diagnosis: 89 year old female status post multifocal intracranial hemorrhage in the left temporal and frontal lobes as well as the frontoparietal convexity, etiology likely traumatic versus cerebral amyloid angiopathy Does the need for close, 24 hr/day medical supervision in concert with the patient's rehab needs make it unreasonable for this patient to be served in a less intensive setting? Yes Co-Morbidities requiring supervision/potential complications:  -Acute ischemic infarct in the left frontal white matter -Hypertension -Mild dementia Due to bladder management,  bowel management, safety, skin/wound care, disease management, medication administration, pain management, and patient education, does the patient require 24 hr/day rehab nursing? Yes Does the patient require coordinated care of a physician, rehab nurse, therapy disciplines of PT, OT, SLP to address physical and functional deficits in the context of the above medical diagnosis(es)? Yes Addressing deficits in the following areas: balance, endurance, locomotion, strength, transferring, bowel/bladder control, bathing, dressing, feeding, grooming, toileting, cognition, language, and psychosocial support Can the patient actively participate in an intensive therapy program of at least 3 hrs of therapy per day at least 5 days per week? Yes The potential for patient to make measurable gains while on inpatient rehab is excellent Anticipated functional outcomes upon discharge from inpatient rehab are supervision  with PT, supervision and min assist with OT, supervision with SLP. Estimated rehab length of stay to reach the above functional goals is: 13-17 days Anticipated discharge destination: Home Overall Rehab/Functional Prognosis: good  POST ACUTE RECOMMENDATIONS: This patient's condition is appropriate for continued rehabilitative care in the following setting: CIR Patient has agreed to participate in recommended program. Yes Note that insurance prior authorization may be required for reimbursement for recommended care.  Comment: Patient was independent at home for large portions of each day prior to this admission.  Family is now looking at assisted living.  Patient wants to regain independence and can tolerate the intensity of our program. Rehab Admissions Coordinator to follow up.       I have personally performed a face to face diagnostic evaluation of this patient. Additionally, I have examined the patient's medical record including any pertinent labs and radiographic images. If the physician  assistant has documented in this note, I have reviewed and edited or otherwise concur with the physician assistant's documentation.  Thanks,  Ranelle Oyster, MD 02/27/2023

## 2023-02-27 NOTE — Progress Notes (Signed)
PROGRESS NOTE Christine Henderson  WUJ:[id number] DOB: [birth year] DOA: 02/23/2023 PCP: Bradd Canary, MD  Brief Narrative/Hospital Course: Christine Henderson is a 89 y.o. female with a history of primary hypertension, peripheral neuropathy, hyperlipidemia, dementia.  Patient presented secondary to being found down and found to have evidence of an intracerebral hemorrhage with incidental finding of an acute ischemic stroke. Patient admitted by neurology to ICU. Neurosurgery consulted with recommendations for no neurosurgical intervention. Patient managed off of antithrombotic therapy . Neurology has seen the patient   Subjective: Patient seen and examined this morning Overnight afebrile BP stable 120s to 130s, not hypoxic Labs are stable CBC and BMP   Assessment and Plan: Principal Problem:   Hemorrhagic stroke [facility]) Active Problems:   Hyperlipidemia   Mild dementia   Cerebral amyloid angiopathy (HCC)   Pressure injury of skin   Multifocal ICH likely traumatic versus CAA related Acute ischemic stroke incidental finding on left frontal white matter: completed workup for ICH and stroke with MRI, MRA, carotid Doppler, LDL 89 A1c 6.4 UDS negative.  Please see report of imaging in detail. No antithrombotic due to ICH. PT OT recommending CIR. Await CIR plan  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 In differential diagnosis per neurology. Recommendation for no antithrombotics.   Frequent PACs: not A-fib.  Asymptomatic.  Monitor   Primary hypertension:  Goal SBP < 140 mmHg per neurology recommendations secondary to CAA.  BP currently well-controlled on losartan.  Diastolic heart dysfunction G1 DD on echo: asymptomatic  HLD: Continue simvastatin and omega-3 fatty acid outpatient   Dementia Alert awake and pleasant.  Worse from baseline   Right lateral stage I pressure ulcer POA on hip  Pressure Injury 02/23/23 Hip Right;Lateral Stage 1 -  Intact skin with non-blanchable redness of a localized area usually over  a bony prominence. Redness/Irritation upon Initial Head-to-Toe Assessment (Active)  02/23/23 2330  Location: Hip  Location Orientation: Right;Lateral  Staging: Stage 1 -  Intact skin with non-blanchable redness of a localized area usually over a bony prominence.  Wound Description (Comments): Redness/Irritation upon Initial Head-to-Toe Assessment  Present on Admission: Yes   Right/lateral hip. Present on admission  DVT prophylaxis: heparin injection 5,000 Units Start: 02/25/23 1115 SCD's Start: 02/23/23 2251 Code Status:   Code Status: Full Code Family Communication: plan of care discussed with patient/son at bedside. Patient status is: Remains hospitalized because of severity of illness Level of care: Telemetry Medical   Dispo: The patient is from: Home by herself with aids daily.            Anticipated disposition: CIR once available  Objective: Vitals last 24 hrs: Vitals:   02/27/23 0500 02/27/23 0600 02/27/23 0700 02/27/23 0800  BP: (!) 142/81 127/83 127/83 136/70  Pulse:      Resp: 18 18 19 17   Temp:    98.5 F (36.9 C)  TempSrc:    Oral  SpO2: 96% 98%    Weight:       Weight change:   Physical Examination: General exam: alert awake oriented to self son HEENT:Oral mucosa moist, Ear/Nose WNL grossly Respiratory system: Bilaterally clear BS,no use of accessory muscle Cardiovascular system: S1 & S2 +, No JVD. Gastrointestinal system: Abdomen soft,NT,ND, BS+ Nervous System: Alert, awake, moving all extremities,and following commands. Extremities: LE edema neg,distal peripheral pulses palpable and warm.  Skin: No rashes,no icterus. MSK: Normal muscle bulk,tone, power   Medications reviewed:  Scheduled Meds:  Chlorhexidine Gluconate Cloth  6 each Topical Daily   donepezil  10  mg Oral QHS   heparin injection (subcutaneous)  5,000 Units Subcutaneous Q12H   losartan  25 mg Oral Daily   pantoprazole  40 mg Oral QHS   senna-docusate  1 tablet Oral BID   simvastatin  10  mg Oral QHS   Continuous Infusions:  Diet Order             Diet regular Room service appropriate? Yes with Assist; Fluid consistency: Thin  Diet effective now                  No intake or output data in the 24 hours ending 02/27/23 1106 Net IO Since Admission: 2,711.49 mL [02/27/23 1106]  Wt Readings from Last 3 Encounters:  02/24/23 54.3 kg  11/28/22 58.5 kg  11/21/22 55.3 kg     Unresulted Labs (From admission, onward)     Start     Ordered   02/25/23 0500  Basic metabolic panel  Daily,   R     Question:  Specimen collection method  Answer:  Lab=Lab collect   02/24/23 2236   02/25/23 0500  CBC  Daily,   R     Question:  Specimen collection method  Answer:  Lab=Lab collect   02/24/23 2236   02/23/23 2254  Urinalysis, Complete w Microscopic -Urine, Clean Catch  Once,   R       Question:  Specimen Source  Answer:  Urine, Clean Catch   02/23/23 2256          Data Reviewed: I have personally reviewed following labs and imaging studies CBC: Recent Labs  Lab 02/23/23 1855 02/23/23 1903 02/24/23 0756 02/25/23 0509 02/26/23 0515 02/27/23 0624  WBC 16.4*  --  12.9* 11.9* 9.0 10.4  NEUTROABS 15.0*  --   --   --   --   --   HGB 15.1* 14.6 14.4 12.5 12.6 13.1  HCT 45.7 43.0 41.3 37.2 37.8 39.2  MCV 94.6  --  92.6 93.0 94.5 94.5  PLT 309  --  282 242 256 282   Basic Metabolic Panel:  Recent Labs  Lab 02/23/23 1855 02/23/23 1903 02/25/23 0509 02/26/23 0515 02/27/23 0624  NA 135 137 137 138 136  K 4.0 4.0 3.7 3.7 3.5  CL 103 105 107 106 104  CO2 20*  --  24 24 22   GLUCOSE 157* 147* 116* 116* 123*  BUN 29* 32* 35* 27* 16  CREATININE 1.05* 0.90 0.92 0.92 0.90  CALCIUM 9.2  --  8.7* 8.7* 8.7*   GFR: Estimated Creatinine Clearance: 32.9 mL/min (by C-G formula based on SCr of 0.9 mg/dL). Liver Function Tests:  Recent Labs  Lab 02/23/23 1855  AST 68*  ALT 41  ALKPHOS 65  BILITOT 2.7*  PROT 7.4  ALBUMIN 3.8  Sepsis Labs: No results for input(s):  "PROCALCITON", "LATICACIDVEN" in the last 168 hours. Recent Results (from the past 240 hours)  MRSA Next Gen by PCR, Nasal     Status: None   Collection Time: 02/23/23 11:37 PM   Specimen: Nasal Mucosa; Nasal Swab  Result Value Ref Range Status   MRSA by PCR Next Gen NOT DETECTED NOT DETECTED Final    Comment: (NOTE) The GeneXpert MRSA Assay (FDA approved for NASAL specimens only), is one component of a comprehensive MRSA colonization surveillance program. It is not intended to diagnose MRSA infection nor to guide or monitor treatment for MRSA infections. Test performance is not FDA approved in patients less than 35 years old.  Performed at [facility], [address] [place], [place] [address]   Antimicrobials/Microbiology: Anti-infectives (From admission, onward)    None         Component Value Date/Time   SDES  11/19/2022 1803    BLOOD RIGHT ANTECUBITAL Performed at [facility], [address] [place], [place] [address]    [facility]  11/19/2022 1803    BOTTLES DRAWN AEROBIC AND ANAEROBIC Blood Culture adequate volume Performed at [facility], [address] [place], [place] [address]    CULT  11/19/2022 1803    NO GROWTH 5 DAYS Performed at [facility], [address], [place], [place] [address]    REPTSTATUS 11/24/2022 FINAL 11/19/2022 1803   Radiology Studies: No results found.   LOS: 4 days   Total time spent in review of labs and imaging, patient evaluation, formulation of plan, documentation and communication with family: 35 minutes  Lanae Boast, MD [facility]  02/27/2023, 11:06 AM

## 2023-02-27 NOTE — Plan of Care (Signed)
  Problem: Coping: Goal: Will verbalize positive feelings about self Outcome: Progressing   Problem: Nutrition: Goal: Risk of aspiration will decrease Outcome: Progressing Goal: Dietary intake will improve Outcome: Progressing   Problem: Clinical Measurements: Goal: Ability to maintain clinical measurements within normal limits will improve Outcome: Progressing Goal: Will remain free from infection Outcome: Progressing Goal: Diagnostic test results will improve Outcome: Progressing Goal: Respiratory complications will improve Outcome: Progressing Goal: Cardiovascular complication will be avoided Outcome: Progressing   Problem: Nutrition: Goal: Adequate nutrition will be maintained Outcome: Progressing   Problem: Coping: Goal: Level of anxiety will decrease Outcome: Progressing   Problem: Safety: Goal: Ability to remain free from injury will improve Outcome: Progressing

## 2023-02-28 DIAGNOSIS — I619 Nontraumatic intracerebral hemorrhage, unspecified: Secondary | ICD-10-CM | POA: Diagnosis not present

## 2023-02-28 LAB — BASIC METABOLIC PANEL
Anion gap: 8 (ref 5–15)
BUN: 19 mg/dL (ref 8–23)
CO2: 23 mmol/L (ref 22–32)
Calcium: 8.8 mg/dL — ABNORMAL LOW (ref 8.9–10.3)
Chloride: 104 mmol/L (ref 98–111)
Creatinine, Ser: 0.96 mg/dL (ref 0.44–1.00)
GFR, Estimated: 56 mL/min — ABNORMAL LOW (ref >60)
Glucose, Bld: 122 mg/dL — ABNORMAL HIGH (ref 70–99)
Potassium: 4.3 mmol/L (ref 3.5–5.1)
Sodium: 135 mmol/L (ref 135–145)

## 2023-02-28 LAB — CBC
HCT: 38.1 % (ref 36.0–46.0)
Hemoglobin: 12.9 g/dL (ref 12.0–15.0)
MCH: 31.8 pg (ref 26.0–34.0)
MCHC: 33.9 g/dL (ref 30.0–36.0)
MCV: 93.8 fL (ref 80.0–100.0)
Platelets: 289 10*3/uL (ref 150–400)
RBC: 4.06 MIL/uL (ref 3.87–5.11)
RDW: 15.3 % (ref 11.5–15.5)
WBC: 9.4 10*3/uL (ref 4.0–10.5)
nRBC: 0 % (ref 0.0–0.2)

## 2023-02-28 LAB — GLUCOSE, CAPILLARY: Glucose-Capillary: 155 mg/dL — ABNORMAL HIGH (ref 70–99)

## 2023-02-28 NOTE — Progress Notes (Signed)
PROGRESS NOTE Christine Henderson  KGM:[id number] DOB: 1931/01/26 DOA: 02/23/2023 PCP: Bradd Canary, MD  Brief Narrative/Hospital Course: Christine Henderson is a 89 y.o. female with a history of primary hypertension, peripheral neuropathy, hyperlipidemia, dementia.  Patient presented secondary to being found down and found to have evidence of an intracerebral hemorrhage with incidental finding of an acute ischemic stroke. Patient admitted by neurology to ICU. Neurosurgery consulted with recommendations for no neurosurgical intervention. Patient managed off of antithrombotic therapy . Neurology has seen the patient.  Patient remains weak and deconditioned and planning for CIR   Subjective: Patient seen and examined this morning Resting comfortably on the bedside chair Has no complaints appears pleasantly confused with her dementia Overnight patient has been afebrile, BP stable    Assessment and Plan: Principal Problem:   Hemorrhagic stroke (HCC) Active Problems:   Hyperlipidemia   Mild dementia   Cerebral amyloid angiopathy (HCC)   Pressure injury of skin   Multifocal ICH likely traumatic versus CAA related Acute ischemic stroke incidental finding on left frontal white matter: completed workup for ICH and stroke with MRI, MRA, carotid Doppler, LDL 89 A1c 6.4 UDS negative see detailed imaging reports in chart.No antithrombotic due to ICH.  Cont on PT OT> recommending CIR- TOC to eval monday.  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 In differential diagnosis per neurology. Recommendation for no antithrombotics.   Frequent PACs: not A-fib.  Asymptomatic.Monitor   Primary hypertension:  Remains fairly controlled.  Goal SBP < 140 mmHg per neurology recommendations secondary to CAA.  Continue losartan-adjust dose as needed.  Diastolic heart dysfunction G1 DD on echo: Asymptomatic-and euvolemic.  HLD: Stable, continue simvastatin and omega-3 fatty acid outpatient   Dementia Alert awake and pleasant confused  follows commands.But overall she is worse from baseline since ICH per son.   Right lateral stage I pressure ulcer POA on hip  Pressure Injury 02/23/23 Hip Right;Lateral Stage 1 -  Intact skin with non-blanchable redness of a localized area usually over a bony prominence. Redness/Irritation upon Initial Head-to-Toe Assessment (Active)  02/23/23 2330  Location: Hip  Location Orientation: Right;Lateral  Staging: Stage 1 -  Intact skin with non-blanchable redness of a localized area usually over a bony prominence.  Wound Description (Comments): Redness/Irritation upon Initial Head-to-Toe Assessment  Present on Admission: Yes   Right/lateral hip. Present on admission  DVT prophylaxis: heparin injection 5,000 Units Start: 02/25/23 1115 SCD's Start: 02/23/23 2251 Code Status:   Code Status: Full Code Family Communication: plan of care discussed with patient/son at bedside. Patient status is: Remains hospitalized because of severity of illness Level of care: Telemetry Medical   Dispo: The patient is from: Home by herself with aids daily.            Anticipated disposition: CIR once available  Objective: Vitals last 24 hrs: Vitals:   02/27/23 1949 02/28/23 0010 02/28/23 0337 02/28/23 0735  BP: 117/78 (!) 151/99 (!) 148/79 (!) 148/96  Pulse: 83 89 65 79  Resp: 16 18 16 17   Temp: 97.8 F (36.6 C) 98.8 F (37.1 C) 98 F (36.7 C) 98.4 F (36.9 C)  TempSrc: Oral Oral Oral Oral  SpO2: 97% 97% 95% 99%  Weight:       Weight change:   Physical Examination: General exam: alert awake, oriented to self, answers some questions, pleasantly confused  HEENT:Oral mucosa moist, Ear/Nose WNL grossly Respiratory system: Bilaterally clear BS,no use of accessory muscle Cardiovascular system: S1 & S2 +, No JVD. Gastrointestinal system: Abdomen soft,NT,ND, BS+ Nervous  System: Alert, awake, moving all extremities,and following commands. Extremities: LE edema neg,distal peripheral pulses palpable and  warm.  Skin: No rashes,no icterus. MSK: Normal muscle bulk,tone, power   Medications reviewed:  Scheduled Meds:  Chlorhexidine Gluconate Cloth  6 each Topical Daily   donepezil  10 mg Oral QHS   heparin injection (subcutaneous)  5,000 Units Subcutaneous Q12H   losartan  25 mg Oral Daily   pantoprazole  40 mg Oral QHS   senna-docusate  1 tablet Oral BID   simvastatin  10 mg Oral QHS   Continuous Infusions:  Diet Order             Diet regular Room service appropriate? Yes with Assist; Fluid consistency: Thin  Diet effective now                   Intake/Output Summary (Last 24 hours) at 02/28/2023 1047 Last data filed at 02/28/2023 0515 Gross per 24 hour  Intake --  Output 300 ml  Net -300 ml   Net IO Since Admission: 2,411.49 mL [02/28/23 1047]  Wt Readings from Last 3 Encounters:  02/24/23 54.3 kg  11/28/22 58.5 kg  11/21/22 55.3 kg     Unresulted Labs (From admission, onward)     Start     Ordered   02/25/23 0500  Basic metabolic panel  Daily,   R     Question:  Specimen collection method  Answer:  Lab=Lab collect   02/24/23 2236   02/25/23 0500  CBC  Daily,   R     Question:  Specimen collection method  Answer:  Lab=Lab collect   02/24/23 2236   02/23/23 2254  Urinalysis, Complete w Microscopic -Urine, Clean Catch  Once,   R       Question:  Specimen Source  Answer:  Urine, Clean Catch   02/23/23 2256          Data Reviewed: I have personally reviewed following labs and imaging studies CBC: Recent Labs  Lab 02/23/23 1855 02/23/23 1903 02/24/23 0756 02/25/23 0509 02/26/23 0515 02/27/23 0624 02/28/23 0443  WBC 16.4*  --  12.9* 11.9* 9.0 10.4 9.4  NEUTROABS 15.0*  --   --   --   --   --   --   HGB 15.1*   < > 14.4 12.5 12.6 13.1 12.9  HCT 45.7   < > 41.3 37.2 37.8 39.2 38.1  MCV 94.6  --  92.6 93.0 94.5 94.5 93.8  PLT 309  --  282 242 256 282 289   < > = values in this interval not displayed.   Basic Metabolic Panel:  Recent Labs  Lab  02/23/23 1855 02/23/23 1903 02/25/23 0509 02/26/23 0515 02/27/23 0624 02/28/23 0443  NA 135 137 137 138 136 135  K 4.0 4.0 3.7 3.7 3.5 4.3  CL 103 105 107 106 104 104  CO2 20*  --  24 24 22 23   GLUCOSE 157* 147* 116* 116* 123* 122*  BUN 29* 32* 35* 27* 16 19  CREATININE 1.05* 0.90 0.92 0.92 0.90 0.96  CALCIUM 9.2  --  8.7* 8.7* 8.7* 8.8*   GFR: Estimated Creatinine Clearance: 30.8 mL/min (by C-G formula based on SCr of 0.96 mg/dL). Liver Function Tests:  Recent Labs  Lab 02/23/23 1855  AST 68*  ALT 41  ALKPHOS 65  BILITOT 2.7*  PROT 7.4  ALBUMIN 3.8  Sepsis Labs: No results for input(s): "PROCALCITON", "LATICACIDVEN" in the last 168 hours. Recent Results (  from the past 240 hours)  MRSA Next Gen by PCR, Nasal     Status: None   Collection Time: 02/23/23 11:37 PM   Specimen: Nasal Mucosa; Nasal Swab  Result Value Ref Range Status   MRSA by PCR Next Gen NOT DETECTED NOT DETECTED Final    Comment: (NOTE) The GeneXpert MRSA Assay (FDA approved for NASAL specimens only), is one component of a comprehensive MRSA colonization surveillance program. It is not intended to diagnose MRSA infection nor to guide or monitor treatment for MRSA infections. Test performance is not FDA approved in patients less than 16 years old. Performed at [facility], [address], [place], [place] [address]   Antimicrobials/Microbiology: Anti-infectives (From admission, onward)    None         Component Value Date/Time   SDES  11/19/2022 1803    BLOOD RIGHT ANTECUBITAL Performed at [facility], [address] [place], [place] [address]    [facility]  11/19/2022 1803    BOTTLES DRAWN AEROBIC AND ANAEROBIC Blood Culture adequate volume Performed at [facility], [address] [place], [place] [address]    CULT  11/19/2022 1803    NO GROWTH 5 DAYS Performed at [facility], [address], [place], [place] [address]    REPTSTATUS 11/24/2022 FINAL  11/19/2022 1803   Radiology Studies: No results found.   LOS: 5 days   Total time spent in review of labs and imaging, patient evaluation, formulation of plan, documentation and communication with family: 35 minutes  Lanae Boast, MD [facility]  02/28/2023, 10:47 AM

## 2023-02-28 NOTE — Progress Notes (Signed)
Occupational Therapy Treatment Patient Details Name: Christine Henderson MRN: [id number] DOB: [birth year] Today's Date: 02/28/2023   History of present illness 89 yo female [facility] with fall at home. CTH reveals multifocal L hemisphere acute hemorrhagic contusions, largest is 3.5 cm L temporal with small amount of overlying subarachnoid blood and IVH. MRI punctate acute ischemic stroke in left frontal white matter.  PMH includes HLD, HTN, peripheral neuropathy, osteopenia, recurrent falls, disequilibrium, IBS, mild dementia.   OT comments  Patient demonstrating good gains with OT treatment. Patient able to get to EOB with CGA and required mod assist to stand and transfer to [facility]. Patient stood from [facility] with cues for hand placement and min assist and ambulated to sink. Patient able to stand at sink for grooming tasks and bathing with one extremity support and seated rest break before completing. Patient able to ambulate to recliner with min assist and verbal cues. Patient will benefit from intensive inpatient follow up therapy, >3 hours/day for continued OT treatment to address bathing, dressing, and functional transfers. Acute OT to continue to follow.       If plan is discharge home, recommend the following:  A lot of help with walking and/or transfers;A lot of help with bathing/dressing/bathroom;Assist for transportation;Supervision due to cognitive status;Direct supervision/assist for financial management;Direct supervision/assist for medications management   Equipment Recommendations  Other (comment) (defer to next venue of care)    Recommendations for Other Services      Precautions / Restrictions Precautions Precautions: Fall Precaution Comments: SBP< 160 Restrictions Weight Bearing Restrictions Per Provider Order: No       Mobility Bed Mobility Overal bed mobility: Needs Assistance Bed Mobility: Supine to Sit     Supine to sit: Contact guard, Used rails      General bed mobility comments: increased time and directions for rail use and scooting to EOB    Transfers Overall transfer level: Needs assistance Equipment used: Rolling walker (2 wheels) Transfers: Sit to/from Stand Sit to Stand: Min assist, Mod assist     Step pivot transfers: Mod assist     General transfer comment: mod assist  to power up initially and progressed to min assist     Balance Overall balance assessment: Needs assistance, History of Falls Sitting-balance support: No upper extremity supported, Bilateral upper extremity supported, Feet supported Sitting balance-Leahy Scale: Good Sitting balance - Comments: EOB   Standing balance support: Single extremity supported, Bilateral upper extremity supported, During functional activity Standing balance-Leahy Scale: Poor Standing balance comment: able to stand at sink with one extremity support during self care tasks                           ADL either performed or assessed with clinical judgement   ADL Overall ADL's : Needs assistance/impaired     Grooming: Wash/dry hands;Wash/dry face;Oral care;Minimal assistance;Standing;Cueing for sequencing Grooming Details (indicate cue type and reason): stood at sink for grooming tasks with seated rest break before completing Upper Body Bathing: Contact guard assist;Standing   Lower Body Bathing: Moderate assistance;Sit to/from stand Lower Body Bathing Details (indicate cue type and reason): performed peri area bathing while standing with mod assist to complete Upper Body Dressing : Minimal assistance;Sitting Upper Body Dressing Details (indicate cue type and reason): change gown Lower Body Dressing: Maximal assistance;Sit to/from stand Lower Body Dressing Details (indicate cue type and reason): to change socks Toilet Transfer: Moderate assistance;BSC/3in1;Rolling walker (2 wheels) Toilet Transfer Details (  indicate cue type and reason): mod assist to power up and  cues for hand placement Toileting- Clothing Manipulation and Hygiene: Maximal assistance;Sit to/from stand              Extremity/Trunk Assessment              Vision       Perception     Praxis      Cognition Arousal: Alert Behavior During Therapy: WFL for tasks assessed/performed Overall Cognitive Status: History of cognitive impairments - at baseline Area of Impairment: Orientation, Memory, Following commands, Safety/judgement, Problem solving                 Orientation Level: Disoriented to, Place, Time, Situation Current Attention Level: Sustained Memory: Decreased short-term memory Following Commands: Follows one step commands with increased time Safety/Judgement: Decreased awareness of deficits, Decreased awareness of safety   Problem Solving: Slow processing General Comments: perseverated on tasks and required verbal cues for sequencing        Exercises      Shoulder Instructions       General Comments      Pertinent Vitals/ Pain       Pain Assessment Pain Assessment: No/denies pain  Home Living                                          Prior Functioning/Environment              Frequency  Min 1X/week        Progress Toward Goals  OT Goals(current goals can now be found in the care plan section)  Progress towards OT goals: Progressing toward goals  Acute Rehab OT Goals Patient Stated Goal: none stated OT Goal Formulation: Patient unable to participate in goal setting Time For Goal Achievement: 03/13/23 Potential to Achieve Goals: Good ADL Goals Pt Will Perform Lower Body Bathing: with min assist;sit to/from stand Pt Will Perform Lower Body Dressing: with min assist;sit to/from stand Pt Will Transfer to Toilet: with contact guard assist;ambulating;regular height toilet;grab bars Pt Will Perform Toileting - Clothing Manipulation and hygiene: with min assist;sit to/from stand  Plan       Co-evaluation                 AM-PAC OT "6 Clicks" Daily Activity     Outcome Measure   Help from another person eating meals?: A Little Help from another person taking care of personal grooming?: A Little Help from another person toileting, which includes using toliet, bedpan, or urinal?: A Lot Help from another person bathing (including washing, rinsing, drying)?: A Lot Help from another person to put on and taking off regular upper body clothing?: A Little Help from another person to put on and taking off regular lower body clothing?: A Lot 6 Click Score: 15    End of Session Equipment Utilized During Treatment: Gait belt;Rolling walker (2 wheels)  OT Visit Diagnosis: Unsteadiness on feet (R26.81);History of falling (Z91.81)   Activity Tolerance Patient tolerated treatment well   Patient Left in chair;with call bell/phone within reach;with chair alarm set   Nurse Communication Mobility status        Time: 1610-9604 OT Time Calculation (min): 32 min  Charges: OT General Charges $OT Visit: 1 Visit OT Treatments $Self Care/Home Management : 23-37 mins  Alfonse Flavors, OTA Acute Rehabilitation Services  Office [phone]  Khaleesi Gruel Jeannett Senior 02/28/2023, 1:10 PM

## 2023-02-28 NOTE — Plan of Care (Signed)
  Problem: Pain Management: Goal: General experience of comfort will improve Outcome: Progressing   Problem: Safety: Goal: Ability to remain free from injury will improve Outcome: Progressing   Problem: Skin Integrity: Goal: Risk for impaired skin integrity will decrease Outcome: Progressing

## 2023-03-01 DIAGNOSIS — I619 Nontraumatic intracerebral hemorrhage, unspecified: Secondary | ICD-10-CM | POA: Diagnosis not present

## 2023-03-01 LAB — CBC
HCT: 36.6 % (ref 36.0–46.0)
Hemoglobin: 12.4 g/dL (ref 12.0–15.0)
MCH: 31.7 pg (ref 26.0–34.0)
MCHC: 33.9 g/dL (ref 30.0–36.0)
MCV: 93.6 fL (ref 80.0–100.0)
Platelets: 276 10*3/uL (ref 150–400)
RBC: 3.91 MIL/uL (ref 3.87–5.11)
RDW: 15.2 % (ref 11.5–15.5)
WBC: 9.4 10*3/uL (ref 4.0–10.5)
nRBC: 0 % (ref 0.0–0.2)

## 2023-03-01 LAB — BASIC METABOLIC PANEL
Anion gap: 7 (ref 5–15)
BUN: 18 mg/dL (ref 8–23)
CO2: 23 mmol/L (ref 22–32)
Calcium: 8.6 mg/dL — ABNORMAL LOW (ref 8.9–10.3)
Chloride: 104 mmol/L (ref 98–111)
Creatinine, Ser: 0.86 mg/dL (ref 0.44–1.00)
GFR, Estimated: >60 mL/min (ref >60)
Glucose, Bld: 123 mg/dL — ABNORMAL HIGH (ref 70–99)
Potassium: 3.7 mmol/L (ref 3.5–5.1)
Sodium: 134 mmol/L — ABNORMAL LOW (ref 135–145)

## 2023-03-01 MED ORDER — HYDROCORTISONE (PERIANAL) 2.5 % EX CREA
TOPICAL_CREAM | Freq: Two times a day (BID) | CUTANEOUS | Status: DC | PRN
  Filled 2023-03-01: qty 28.35

## 2023-03-01 NOTE — Progress Notes (Signed)
PROGRESS NOTE Christine Henderson  JYN:[id number] DOB: [birth year] DOA: 02/23/2023 PCP: Bradd Canary, MD  Brief Narrative/Hospital Course: Christine Henderson is a 89 y.o. female with a history of primary hypertension, peripheral neuropathy, hyperlipidemia, dementia.  Patient presented secondary to being found down and found to have evidence of an intracerebral hemorrhage with incidental finding of an acute ischemic stroke. Patient admitted by neurology to ICU. Neurosurgery consulted with recommendations for no neurosurgical intervention. Patient managed off of antithrombotic therapy . Neurology has seen the patient.  Patient remains weak and deconditioned and planning for CIR   Subjective: Patient seen and examined this morning Alert awake oriented to self no complaint Resting comfortably on the bedside chair Has no complaints appears pleasantly confused with her dementia Overnight patient has been afebrile, BP stable    Assessment and Plan: Principal Problem:   Hemorrhagic stroke (HCC) Active Problems:   Hyperlipidemia   Mild dementia   Cerebral amyloid angiopathy (HCC)   Pressure injury of skin   Multifocal ICH likely traumatic versus CAA related Acute ischemic stroke incidental finding on left frontal white matter: completed workup for ICH and stroke with MRI, MRA, carotid Doppler, LDL 89 A1c 6.4 UDS negative see detailed imaging reports in chart.No antithrombotic due to ICH.Cont on PT OT> recommending CIR and will eval 12/30  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 In differential diagnosis per neurology.Recommendation for no antithrombotics.   Frequent PACs: asymptomatic.Monitor   Primary hypertension:  Remains fairly controlled.  Goal SBP < 140 mmHg per neurology recommendations secondary to CAA.  Continue losartan-adjust dose as needed.  Diastolic heart dysfunction G1 DD on echo: Asymptomatic-and euvolemic.  Monitor swelling/volume status  HLD: continue simvastatin and omega-3 fatty acid  outpatient   Dementia Alert awake and pleasantly confused , oriented to self  and family.she is not agitated.   As per son overall she is worse from baseline since ICH but remains stable.   Right lateral stage I pressure ulcer POA on hip , continue wound care Pressure Injury 02/23/23 Hip Right;Lateral Stage 1 -  Intact skin with non-blanchable redness of a localized area usually over a bony prominence. Redness/Irritation upon Initial Head-to-Toe Assessment (Active)  02/23/23 2330  Location: Hip  Location Orientation: Right;Lateral  Staging: Stage 1 -  Intact skin with non-blanchable redness of a localized area usually over a bony prominence.  Wound Description (Comments): Redness/Irritation upon Initial Head-to-Toe Assessment  Present on Admission: Yes   DVT prophylaxis: heparin injection 5,000 Units Start: 02/25/23 1115 SCD's Start: 02/23/23 2251 Code Status:   Code Status: Full Code Family Communication: plan of care discussed with patient/son at bedside updated previously, no family  at bedside 12/29 Patient status is: Remains hospitalized because of severity of illness Level of care: Telemetry Medical   Dispo: The patient is from: Home by herself with aids daily.            Anticipated disposition: CIR once available  Objective: Vitals last 24 hrs: Vitals:   02/28/23 1943 02/28/23 2347 03/01/23 0427 03/01/23 0811  BP: 114/71 134/80 (!) 151/86 (!) 142/82  Pulse: 89 89 74 76  Resp: 17 18 18 18   Temp: (!) 97.5 F (36.4 C) 98.2 F (36.8 C) 98.2 F (36.8 C) 97.8 F (36.6 C)  TempSrc:  Oral Oral Oral  SpO2: 97% 98% 95% 97%  Weight:       Weight change:   Physical Examination: General exam: alert awake, oriented to self HEENT:Oral mucosa moist, Ear/Nose WNL grossly Respiratory system: Bilaterally clear BS,no use  of accessory muscle Cardiovascular system: S1 & S2 +, No JVD. Gastrointestinal system: Abdomen soft,NT,ND, BS+ Nervous System: Alert, awake, moving all  extremities,and following commands. Extremities: LE edema neg,distal peripheral pulses palpable and warm.  Skin: No rashes,no icterus. MSK: Normal muscle bulk,tone, power   Medications reviewed:  Scheduled Meds:  Chlorhexidine Gluconate Cloth  6 each Topical Daily   donepezil  10 mg Oral QHS   heparin injection (subcutaneous)  5,000 Units Subcutaneous Q12H   losartan  25 mg Oral Daily   pantoprazole  40 mg Oral QHS   senna-docusate  1 tablet Oral BID   simvastatin  10 mg Oral QHS   Continuous Infusions:  Diet Order             Diet regular Room service appropriate? Yes with Assist; Fluid consistency: Thin  Diet effective now                  Intake/Output Summary (Last 24 hours) at 03/01/2023 1052 Last data filed at 02/28/2023 1847 Gross per 24 hour  Intake 360 ml  Output --  Net 360 ml  Net IO Since Admission: 2,771.49 mL [03/01/23 1052]  Wt Readings from Last 3 Encounters:  02/24/23 54.3 kg  11/28/22 58.5 kg  11/21/22 55.3 kg   Unresulted Labs (From admission, onward)     Start     Ordered   02/23/23 2254  Urinalysis, Complete w Microscopic -Urine, Clean Catch  Once,   R       Question:  Specimen Source  Answer:  Urine, Clean Catch   02/23/23 2256          Data Reviewed: I have personally reviewed following labs and imaging studies Recent Labs  Lab 02/23/23 1855 02/23/23 1903 02/25/23 0509 02/26/23 0515 02/27/23 0624 02/28/23 0443 03/01/23 0503  WBC 16.4*   < > 11.9* 9.0 10.4 9.4 9.4  NEUTROABS 15.0*  --   --   --   --   --   --   HGB 15.1*   < > 12.5 12.6 13.1 12.9 12.4  HCT 45.7   < > 37.2 37.8 39.2 38.1 36.6  MCV 94.6   < > 93.0 94.5 94.5 93.8 93.6  PLT 309   < > 242 256 282 289 276   < > = values in this interval not displayed.   Basic Metabolic Panel:  Recent Labs  Lab 02/25/23 0509 02/26/23 0515 02/27/23 0624 02/28/23 0443 03/01/23 0503  NA 137 138 136 135 134*  K 3.7 3.7 3.5 4.3 3.7  CL 107 106 104 104 104  CO2 24 24 22 23 23    GLUCOSE 116* 116* 123* 122* 123*  BUN 35* 27* 16 19 18   CREATININE 0.92 0.92 0.90 0.96 0.86  CALCIUM 8.7* 8.7* 8.7* 8.8* 8.6*   GFR: Estimated Creatinine Clearance: 34.4 mL/min (by C-G formula based on SCr of 0.86 mg/dL). Liver Function Tests:  Recent Labs  Lab 02/23/23 1855  AST 68*  ALT 41  ALKPHOS 65  BILITOT 2.7*  PROT 7.4  ALBUMIN 3.8  No results for input(s): "PROCALCITON", "LATICACIDVEN" in the last 168 hours. Recent Results (from the past 240 hours)  MRSA Next Gen by PCR, Nasal     Status: None   Collection Time: 02/23/23 11:37 PM   Specimen: Nasal Mucosa; Nasal Swab  Result Value Ref Range Status   MRSA by PCR Next Gen NOT DETECTED NOT DETECTED Final    Comment: (NOTE) The GeneXpert MRSA Assay (FDA approved  for NASAL specimens only), is one component of a comprehensive MRSA colonization surveillance program. It is not intended to diagnose MRSA infection nor to guide or monitor treatment for MRSA infections. Test performance is not FDA approved in patients less than 2 years old. Performed at [facility], [address], [place], [place] [address]   Antimicrobials/Microbiology: Anti-infectives (From admission, onward)    None         Component Value Date/Time   SDES  11/19/2022 1803    BLOOD RIGHT ANTECUBITAL Performed at [facility], [address] [place], [place] [address]    [facility]  11/19/2022 1803    BOTTLES DRAWN AEROBIC AND ANAEROBIC Blood Culture adequate volume Performed at [facility], [address] [place], [place] [address]    CULT  11/19/2022 1803    NO GROWTH 5 DAYS Performed at [facility], [address] [place], [place] [address]    REPTSTATUS 11/24/2022 FINAL 11/19/2022 1803   Radiology Studies: No results found.   LOS: 6 days   Total time spent in review of labs and imaging, patient evaluation, formulation of plan, documentation and communication with family: 35 minutes  Lanae Boast,  MD [facility]  03/01/2023, 10:52 AM

## 2023-03-01 NOTE — Progress Notes (Signed)
Son at bedside requesting Code status change to DNR, states patient was previously DNR, then made full code, but after family discussion would like patient to be deemed DNR again for code status. MD notified.

## 2023-03-01 NOTE — Plan of Care (Signed)
  Problem: Education: Goal: Knowledge of disease or condition will improve Outcome: Progressing Goal: Knowledge of secondary prevention will improve (MUST DOCUMENT ALL) Outcome: Progressing Goal: Knowledge of patient specific risk factors will improve [name] N/A or DELETE if not current risk factor) Outcome: Progressing   Problem: Intracerebral Hemorrhage Tissue Perfusion: Goal: Complications of Intracerebral Hemorrhage will be minimized Outcome: Progressing   Problem: Coping: Goal: Will verbalize positive feelings about self Outcome: Progressing Goal: Will identify appropriate support needs Outcome: Progressing   Problem: Health Behavior/Discharge Planning: Goal: Ability to manage health-related needs will improve Outcome: Progressing Goal: Goals will be collaboratively established with patient/family Outcome: Progressing   Problem: Self-Care: Goal: Ability to participate in self-care as condition permits will improve Outcome: Progressing Goal: Verbalization of feelings and concerns over difficulty with self-care will improve Outcome: Progressing Goal: Ability to communicate needs accurately will improve Outcome: Progressing   Problem: Nutrition: Goal: Risk of aspiration will decrease Outcome: Progressing Goal: Dietary intake will improve Outcome: Progressing   Problem: Education: Goal: Knowledge of General Education information will improve Description: Including pain rating scale, medication(s)/side effects and non-pharmacologic comfort measures Outcome: Progressing   Problem: Health Behavior/Discharge Planning: Goal: Ability to manage health-related needs will improve Outcome: Progressing   Problem: Clinical Measurements: Goal: Ability to maintain clinical measurements within normal limits will improve Outcome: Progressing Goal: Will remain free from infection Outcome: Progressing Goal: Diagnostic test results will improve Outcome: Progressing Goal:  Respiratory complications will improve Outcome: Progressing Goal: Cardiovascular complication will be avoided Outcome: Progressing   Problem: Activity: Goal: Risk for activity intolerance will decrease Outcome: Progressing   Problem: Nutrition: Goal: Adequate nutrition will be maintained Outcome: Progressing   Problem: Coping: Goal: Level of anxiety will decrease Outcome: Progressing   Problem: Elimination: Goal: Will not experience complications related to bowel motility Outcome: Progressing Goal: Will not experience complications related to urinary retention Outcome: Progressing   Problem: Pain Management: Goal: General experience of comfort will improve Outcome: Progressing   Problem: Safety: Goal: Ability to remain free from injury will improve Outcome: Progressing   Problem: Skin Integrity: Goal: Risk for impaired skin integrity will decrease Outcome: Progressing

## 2023-03-01 NOTE — Plan of Care (Signed)
Problem: Education: Goal: Knowledge of disease or condition will improve 03/01/2023 1814 by Wylie Hail, RN Outcome: Progressing 03/01/2023 1813 by Wylie Hail, RN Outcome: Progressing Goal: Knowledge of secondary prevention will improve (MUST DOCUMENT ALL) 03/01/2023 1814 by Wylie Hail, RN Outcome: Progressing 03/01/2023 1813 by Wylie Hail, RN Outcome: Progressing Goal: Knowledge of patient specific risk factors will improve Christine Henderson N/A or DELETE if not current risk factor) 03/01/2023 1814 by Wylie Hail, RN Outcome: Progressing 03/01/2023 1813 by Wylie Hail, RN Outcome: Progressing   Problem: Intracerebral Hemorrhage Tissue Perfusion: Goal: Complications of Intracerebral Hemorrhage will be minimized 03/01/2023 1814 by Wylie Hail, RN Outcome: Progressing 03/01/2023 1813 by Wylie Hail, RN Outcome: Progressing   Problem: Coping: Goal: Will verbalize positive feelings about self 03/01/2023 1814 by Wylie Hail, RN Outcome: Progressing 03/01/2023 1813 by Wylie Hail, RN Outcome: Progressing Goal: Will identify appropriate support needs 03/01/2023 1814 by Wylie Hail, RN Outcome: Progressing 03/01/2023 1813 by Wylie Hail, RN Outcome: Progressing   Problem: Health Behavior/Discharge Planning: Goal: Ability to manage health-related needs will improve 03/01/2023 1814 by Wylie Hail, RN Outcome: Progressing 03/01/2023 1813 by Wylie Hail, RN Outcome: Progressing Goal: Goals will be collaboratively established with patient/family 03/01/2023 1814 by Wylie Hail, RN Outcome: Progressing 03/01/2023 1813 by Wylie Hail, RN Outcome: Progressing   Problem: Self-Care: Goal: Ability to participate in self-care as condition permits will improve 03/01/2023 1814 by Wylie Hail, RN Outcome: Progressing 03/01/2023 1813 by Wylie Hail, RN Outcome: Progressing Goal: Verbalization of feelings and concerns over difficulty with  self-care will improve 03/01/2023 1814 by Wylie Hail, RN Outcome: Progressing 03/01/2023 1813 by Wylie Hail, RN Outcome: Progressing Goal: Ability to communicate needs accurately will improve 03/01/2023 1814 by Wylie Hail, RN Outcome: Progressing 03/01/2023 1813 by Wylie Hail, RN Outcome: Progressing   Problem: Nutrition: Goal: Risk of aspiration will decrease 03/01/2023 1814 by Wylie Hail, RN Outcome: Progressing 03/01/2023 1813 by Wylie Hail, RN Outcome: Progressing Goal: Dietary intake will improve 03/01/2023 1814 by Wylie Hail, RN Outcome: Progressing 03/01/2023 1813 by Wylie Hail, RN Outcome: Progressing   Problem: Education: Goal: Knowledge of General Education information will improve Description: Including pain rating scale, medication(s)/side effects and non-pharmacologic comfort measures 03/01/2023 1814 by Wylie Hail, RN Outcome: Progressing 03/01/2023 1813 by Wylie Hail, RN Outcome: Progressing   Problem: Health Behavior/Discharge Planning: Goal: Ability to manage health-related needs will improve 03/01/2023 1814 by Wylie Hail, RN Outcome: Progressing 03/01/2023 1813 by Wylie Hail, RN Outcome: Progressing   Problem: Clinical Measurements: Goal: Ability to maintain clinical measurements within normal limits will improve 03/01/2023 1814 by Wylie Hail, RN Outcome: Progressing 03/01/2023 1813 by Wylie Hail, RN Outcome: Progressing Goal: Will remain free from infection 03/01/2023 1814 by Wylie Hail, RN Outcome: Progressing 03/01/2023 1813 by Wylie Hail, RN Outcome: Progressing Goal: Diagnostic test results will improve 03/01/2023 1814 by Wylie Hail, RN Outcome: Progressing 03/01/2023 1813 by Wylie Hail, RN Outcome: Progressing Goal: Respiratory complications will improve 03/01/2023 1814 by Wylie Hail, RN Outcome: Progressing 03/01/2023 1813 by Wylie Hail, RN Outcome:  Progressing Goal: Cardiovascular complication will be avoided 03/01/2023 1814 by Wylie Hail, RN Outcome: Progressing 03/01/2023 1813 by Wylie Hail, RN Outcome: Progressing   Problem: Activity: Goal: Risk for activity intolerance will decrease 03/01/2023 1814 by Wylie Hail, RN Outcome: Progressing  03/01/2023 1813 by Wylie Hail, RN Outcome: Progressing   Problem: Nutrition: Goal: Adequate nutrition will be maintained 03/01/2023 1814 by Wylie Hail, RN Outcome: Progressing 03/01/2023 1813 by Wylie Hail, RN Outcome: Progressing   Problem: Coping: Goal: Level of anxiety will decrease 03/01/2023 1814 by Wylie Hail, RN Outcome: Progressing 03/01/2023 1813 by Wylie Hail, RN Outcome: Progressing   Problem: Elimination: Goal: Will not experience complications related to bowel motility 03/01/2023 1814 by Wylie Hail, RN Outcome: Progressing 03/01/2023 1813 by Wylie Hail, RN Outcome: Progressing Goal: Will not experience complications related to urinary retention 03/01/2023 1814 by Wylie Hail, RN Outcome: Progressing 03/01/2023 1813 by Wylie Hail, RN Outcome: Progressing   Problem: Pain Management: Goal: General experience of comfort will improve 03/01/2023 1814 by Wylie Hail, RN Outcome: Progressing 03/01/2023 1813 by Wylie Hail, RN Outcome: Progressing   Problem: Safety: Goal: Ability to remain free from injury will improve 03/01/2023 1814 by Wylie Hail, RN Outcome: Progressing 03/01/2023 1813 by Wylie Hail, RN Outcome: Progressing   Problem: Skin Integrity: Goal: Risk for impaired skin integrity will decrease 03/01/2023 1814 by Wylie Hail, RN Outcome: Progressing 03/01/2023 1813 by Wylie Hail, RN Outcome: Progressing

## 2023-03-01 NOTE — Plan of Care (Signed)
Problem: Education: Goal: Knowledge of disease or condition will improve 03/01/2023 0605 by Luther Redo, RN Outcome: Progressing 03/01/2023 0605 by Luther Redo, RN Outcome: Progressing Goal: Knowledge of secondary prevention will improve (MUST DOCUMENT ALL) 03/01/2023 0605 by Luther Redo, RN Outcome: Progressing 03/01/2023 0605 by Luther Redo, RN Outcome: Progressing Goal: Knowledge of patient specific risk factors will improve [name] N/A or DELETE if not current risk factor) 03/01/2023 0605 by Luther Redo, RN Outcome: Progressing 03/01/2023 0605 by Luther Redo, RN Outcome: Progressing   Problem: Intracerebral Hemorrhage Tissue Perfusion: Goal: Complications of Intracerebral Hemorrhage will be minimized 03/01/2023 0605 by Luther Redo, RN Outcome: Progressing 03/01/2023 0605 by Luther Redo, RN Outcome: Progressing   Problem: Coping: Goal: Will verbalize positive feelings about self 03/01/2023 0605 by Luther Redo, RN Outcome: Progressing 03/01/2023 0605 by Luther Redo, RN Outcome: Progressing Goal: Will identify appropriate support needs 03/01/2023 0605 by Luther Redo, RN Outcome: Progressing 03/01/2023 0605 by Luther Redo, RN Outcome: Progressing   Problem: Health Behavior/Discharge Planning: Goal: Ability to manage health-related needs will improve 03/01/2023 0605 by Luther Redo, RN Outcome: Progressing 03/01/2023 0605 by Luther Redo, RN Outcome: Progressing Goal: Goals will be collaboratively established with patient/family 03/01/2023 [phone] by Luther Redo, RN Outcome: Progressing 03/01/2023 0605 by Luther Redo, RN Outcome: Progressing   Problem: Self-Care: Goal: Ability to participate in self-care as condition permits will improve 03/01/2023 0605 by Luther Redo, RN Outcome: Progressing 03/01/2023 0605 by Luther Redo, RN Outcome:  Progressing Goal: Verbalization of feelings and concerns over difficulty with self-care will improve 03/01/2023 0605 by Luther Redo, RN Outcome: Progressing 03/01/2023 0605 by Luther Redo, RN Outcome: Progressing Goal: Ability to communicate needs accurately will improve 03/01/2023 0605 by Luther Redo, RN Outcome: Progressing 03/01/2023 0605 by Luther Redo, RN Outcome: Progressing   Problem: Nutrition: Goal: Risk of aspiration will decrease 03/01/2023 0605 by Luther Redo, RN Outcome: Progressing 03/01/2023 0605 by Luther Redo, RN Outcome: Progressing Goal: Dietary intake will improve 03/01/2023 0605 by Luther Redo, RN Outcome: Progressing 03/01/2023 0605 by Luther Redo, RN Outcome: Progressing   Problem: Education: Goal: Knowledge of General Education information will improve Description: Including pain rating scale, medication(s)/side effects and non-pharmacologic comfort measures 03/01/2023 0605 by Luther Redo, RN Outcome: Progressing 03/01/2023 0605 by Luther Redo, RN Outcome: Progressing   Problem: Health Behavior/Discharge Planning: Goal: Ability to manage health-related needs will improve 03/01/2023 0605 by Luther Redo, RN Outcome: Progressing 03/01/2023 0605 by Luther Redo, RN Outcome: Progressing   Problem: Clinical Measurements: Goal: Ability to maintain clinical measurements within normal limits will improve 03/01/2023 0605 by Luther Redo, RN Outcome: Progressing 03/01/2023 0605 by Luther Redo, RN Outcome: Progressing Goal: Will remain free from infection 03/01/2023 0605 by Luther Redo, RN Outcome: Progressing 03/01/2023 0605 by Luther Redo, RN Outcome: Progressing Goal: Diagnostic test results will improve 03/01/2023 0605 by Luther Redo, RN Outcome: Progressing 03/01/2023 0605 by Luther Redo, RN Outcome: Progressing Goal:  Respiratory complications will improve 03/01/2023 0605 by Luther Redo, RN Outcome: Progressing 03/01/2023 0605 by Luther Redo, RN Outcome: Progressing Goal: Cardiovascular complication will be avoided 03/01/2023 7846 by Luther Redo, RN Outcome: Progressing 03/01/2023 0605 by Luther Redo, RN Outcome: Progressing   Problem: Activity: Goal: Risk for activity intolerance will decrease 03/01/2023 0605 by Luther Redo, RN Outcome: Progressing 03/01/2023 0605 by Luther Redo, RN Outcome: Progressing   Problem: Nutrition: Goal: Adequate nutrition will be maintained 03/01/2023 0605 by Luther Redo, RN Outcome: Progressing 03/01/2023 0605 by Luther Redo, RN Outcome: Progressing   Problem: Coping: Goal: Level of  anxiety will decrease 03/01/2023 0605 by Luther Redo, RN Outcome: Progressing 03/01/2023 0605 by Luther Redo, RN Outcome: Progressing   Problem: Elimination: Goal: Will not experience complications related to bowel motility 03/01/2023 0605 by Luther Redo, RN Outcome: Progressing 03/01/2023 0605 by Luther Redo, RN Outcome: Progressing Goal: Will not experience complications related to urinary retention 03/01/2023 0605 by Luther Redo, RN Outcome: Progressing 03/01/2023 0605 by Luther Redo, RN Outcome: Progressing   Problem: Pain Management: Goal: General experience of comfort will improve 03/01/2023 0605 by Luther Redo, RN Outcome: Progressing 03/01/2023 0605 by Luther Redo, RN Outcome: Progressing   Problem: Safety: Goal: Ability to remain free from injury will improve 03/01/2023 0605 by Luther Redo, RN Outcome: Progressing 03/01/2023 0605 by Luther Redo, RN Outcome: Progressing   Problem: Skin Integrity: Goal: Risk for impaired skin integrity will decrease 03/01/2023 0605 by Luther Redo, RN Outcome: Progressing 03/01/2023 0605 by  Luther Redo, RN Outcome: Progressing

## 2023-03-02 DIAGNOSIS — I619 Nontraumatic intracerebral hemorrhage, unspecified: Secondary | ICD-10-CM | POA: Diagnosis not present

## 2023-03-02 MED ORDER — LOSARTAN POTASSIUM 50 MG PO TABS
50.0000 mg | ORAL_TABLET | Freq: Every day | ORAL | Status: DC
  Administered 2023-03-03 – 2023-03-06 (×4): 50 mg via ORAL
  Filled 2023-03-02 (×4): qty 1

## 2023-03-02 NOTE — Care Management Important Message (Signed)
Important Message  Patient Details  Name: Christine [place] MRN: [id number] Date of Birth: [birth year]   Important Message Given:  Yes - Medicare IM     Dorena Bodo 03/02/2023, 3:47 PM

## 2023-03-02 NOTE — Progress Notes (Signed)
PROGRESS NOTE KAYSA SILLA  RUE:[id number] DOB: 1931-01-20 DOA: 02/23/2023 PCP: Bradd Canary, MD  Brief Narrative/Hospital Course: HAILEYMARIE LYE is a 89 y.o. female with a history of primary hypertension, peripheral neuropathy, hyperlipidemia, dementia.  Patient presented secondary to being found down and found to have evidence of an intracerebral hemorrhage with incidental finding of an acute ischemic stroke. Patient admitted by neurology to ICU. Neurosurgery consulted with recommendations for no neurosurgical intervention. Patient managed off of antithrombotic therapy . Neurology has seen the patient.  Patient remains weak and deconditioned and planning for CIR   Subjective: Patient seen and examined this morning discussed patient's son at the bedside, she is at baseline mental status  Was able to ambulate to the door and currently on bedside chair  Pleasantly confused at baseline Assessment and Plan: Principal Problem:   Hemorrhagic stroke [facility]) Active Problems:   Hyperlipidemia   Mild dementia   Cerebral amyloid angiopathy (HCC)   Pressure injury of skin   Multifocal ICH likely traumatic versus CAA related Acute ischemic stroke incidental finding on left frontal white matter: completed workup for ICH and stroke with MRI, MRA, carotid Doppler, LDL 89 A1c 6.4 UDS negative see detailed imaging reports in chart.No antithrombotic due to ICH.Cont on PT OT> recommending CIR awaiting for insurance approval  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 In differential diagnosis per neurology.Recommendation for no antithrombotics.   Frequent PACs: asymptomatic.Monitor   Primary hypertension:  Blood pressure borderline control increase losartan to 50 mg  for Goal SBP < 140 mmHg   Diastolic heart dysfunction G1 DD on echo: Asymptomatic-and euvolemic.  Monitor swelling/volume status  HLD: continue simvastatin and omega-3 fatty acid outpatient   Dementia Alert awake and pleasantly confused and remains at  baseline oriented to self and family. As per son overall she is worse from baseline since ICH but remains stable.  Goals of care: Patient's son has discussed with patient family and has renewed her living will and requesting DNR 12/29  Right lateral stage I pressure ulcer POA on hip , continue wound care Pressure Injury 02/23/23 Hip Right;Lateral Stage 1 -  Intact skin with non-blanchable redness of a localized area usually over a bony prominence. Redness/Irritation upon Initial Head-to-Toe Assessment (Active)  02/23/23 2330  Location: Hip  Location Orientation: Right;Lateral  Staging: Stage 1 -  Intact skin with non-blanchable redness of a localized area usually over a bony prominence.  Wound Description (Comments): Redness/Irritation upon Initial Head-to-Toe Assessment  Present on Admission: Yes   DVT prophylaxis: heparin injection 5,000 Units Start: 02/25/23 1115 SCD's Start: 02/23/23 2251 Code Status:   Code Status: Limited: Do not attempt resuscitation (DNR) -DNR-LIMITED -Do Not Intubate/DNI  Family Communication: plan of care discussed with patient/son at bedside updated previously, no family  at bedside 12/29 Patient status is: Remains hospitalized because of severity of illness Level of care: Telemetry Medical   Dispo: The patient is from: Home by herself with aids daily.            Anticipated disposition: CIR once available  Objective: Vitals last 24 hrs: Vitals:   03/01/23 2350 03/02/23 0440 03/02/23 0804 03/02/23 1115  BP: (!) 143/100 (!) 154/97 (!) 157/92 (!) 142/86  Pulse: 87 81 81 (!) 109  Resp: 18 18 18 19   Temp: 97.7 F (36.5 C) 99.5 F (37.5 C) 98 F (36.7 C) 98.9 F (37.2 C)  TempSrc: Oral Oral Oral Oral  SpO2: 93% 98% 100% 100%  Weight:       Weight change:  Physical Examination: General exam: alert awake, orientedx1-2 HEENT:Oral mucosa moist, Ear/Nose WNL grossly Respiratory system: Bilaterally clear BS,no use of accessory muscle Cardiovascular  system: S1 & S2 +, No JVD. Gastrointestinal system: Abdomen soft,NT,ND, BS+ Nervous System: Alert, awake, moving all extremities,and following commands. Extremities: LE edema neg,distal peripheral pulses palpable and warm.  Skin: No rashes,no icterus. MSK: Normal muscle bulk,tone, power   Medications reviewed:  Scheduled Meds:  donepezil  10 mg Oral QHS   heparin injection (subcutaneous)  5,000 Units Subcutaneous Q12H   [START ON 03/03/2023] losartan  50 mg Oral Daily   pantoprazole  40 mg Oral QHS   senna-docusate  1 tablet Oral BID   simvastatin  10 mg Oral QHS   Continuous Infusions:  Diet Order             Diet regular Room service appropriate? Yes with Assist; Fluid consistency: Thin  Diet effective now                 No intake or output data in the 24 hours ending 03/02/23 1247 Net IO Since Admission: 2,771.49 mL [03/02/23 1247]  Wt Readings from Last 3 Encounters:  02/24/23 54.3 kg  11/28/22 58.5 kg  11/21/22 55.3 kg   Unresulted Labs (From admission, onward)    None     Data Reviewed: I have personally reviewed following labs and imaging studies Recent Labs  Lab 02/23/23 1855 02/23/23 1903 02/25/23 0509 02/26/23 0515 02/27/23 0624 02/28/23 0443 03/01/23 0503  WBC 16.4*   < > 11.9* 9.0 10.4 9.4 9.4  NEUTROABS 15.0*  --   --   --   --   --   --   HGB 15.1*   < > 12.5 12.6 13.1 12.9 12.4  HCT 45.7   < > 37.2 37.8 39.2 38.1 36.6  MCV 94.6   < > 93.0 94.5 94.5 93.8 93.6  PLT 309   < > 242 256 282 289 276   < > = values in this interval not displayed.   Basic Metabolic Panel:  Recent Labs  Lab 02/25/23 0509 02/26/23 0515 02/27/23 0624 02/28/23 0443 03/01/23 0503  NA 137 138 136 135 134*  K 3.7 3.7 3.5 4.3 3.7  CL 107 106 104 104 104  CO2 24 24 22 23 23   GLUCOSE 116* 116* 123* 122* 123*  BUN 35* 27* 16 19 18   CREATININE 0.92 0.92 0.90 0.96 0.86  CALCIUM 8.7* 8.7* 8.7* 8.8* 8.6*   GFR: Estimated Creatinine Clearance: 34.4 mL/min (by C-G  formula based on SCr of 0.86 mg/dL). Liver Function Tests:  Recent Labs  Lab 02/23/23 1855  AST 68*  ALT 41  ALKPHOS 65  BILITOT 2.7*  PROT 7.4  ALBUMIN 3.8  No results for input(s): "PROCALCITON", "LATICACIDVEN" in the last 168 hours. Recent Results (from the past 240 hours)  MRSA Next Gen by PCR, Nasal     Status: None   Collection Time: 02/23/23 11:37 PM   Specimen: Nasal Mucosa; Nasal Swab  Result Value Ref Range Status   MRSA by PCR Next Gen NOT DETECTED NOT DETECTED Final    Comment: (NOTE) The GeneXpert MRSA Assay (FDA approved for NASAL specimens only), is one component of a comprehensive MRSA colonization surveillance program. It is not intended to diagnose MRSA infection nor to guide or monitor treatment for MRSA infections. Test performance is not FDA approved in patients less than 16 years old. Performed at [facility], [address] [place],  [place] [address]   Antimicrobials/Microbiology: Anti-infectives (From admission, onward)    None         Component Value Date/Time   SDES  11/19/2022 1803    BLOOD RIGHT ANTECUBITAL Performed at [facility], [address] [place], [place] [address]    [facility]  11/19/2022 1803    BOTTLES DRAWN AEROBIC AND ANAEROBIC Blood Culture adequate volume Performed at [facility], [address] [place], [place] [address]    CULT  11/19/2022 1803    NO GROWTH 5 DAYS Performed at [facility], [address], [place], [place] [address]    REPTSTATUS 11/24/2022 FINAL 11/19/2022 1803   Radiology Studies: No results found.   LOS: 7 days   Total time spent in review of labs and imaging, patient evaluation, formulation of plan, documentation and communication with family: 35 minutes  Lanae Boast, MD [facility]  03/02/2023, 12:47 PM

## 2023-03-02 NOTE — Progress Notes (Signed)
Physical Therapy Treatment Patient Details Name: Christine Henderson MRN: [id number] DOB: 18-Dec-1930 Today's Date: 03/02/2023   History of Present Illness 89 yo female [facility] with fall at home. CTH reveals multifocal L hemisphere acute hemorrhagic contusions, largest is 3.5 cm L temporal with small amount of overlying subarachnoid blood and IVH. MRI punctate acute ischemic stroke in left frontal white matter.  PMH includes HLD, HTN, peripheral neuropathy, osteopenia, recurrent falls, disequilibrium, IBS, mild dementia.    PT Comments  Pt eager for mobility on arrival and demonstrating steady progress towards acute goals. Pt requiring less physical assist for all mobility this date however continues to be limited by decreased activity tolerance. Pt requiring grossly min A for bed mobility, transfers and gait with RW for support. Pt with very flexed posture in standing this session, needing max multimodal cues to correct with pt unable to achieve full upright. Pt continues to require increased cues to remain on task at hand. Current plan remains appropriate to address deficits and maximize functional independence and decrease caregiver burden. Pt continues to benefit from skilled PT services to progress toward functional mobility goals.      If plan is discharge home, recommend the following: A little help with walking and/or transfers;A little help with bathing/dressing/bathroom;Assistance with cooking/housework;Assist for transportation;Help with stairs or ramp for entrance   Can travel by private vehicle     Yes  Equipment Recommendations  None recommended by PT    Recommendations for Other Services       Precautions / Restrictions Precautions Precautions: Fall Precaution Comments: SBP< 160 Restrictions Weight Bearing Restrictions Per Provider Order: No     Mobility  Bed Mobility Overal bed mobility: Needs Assistance Bed Mobility: Supine to Sit     Supine to sit:  Contact guard, Used rails, Min assist     General bed mobility comments: increased time and directions for rail use and scooting to EOB, light min A to complete    Transfers Overall transfer level: Needs assistance Equipment used: Rolling walker (2 wheels) Transfers: Sit to/from Stand Sit to Stand: Min assist           General transfer comment: min assist  to power up and shift weight anterior    Ambulation/Gait Ambulation/Gait assistance: Min assist Gait Distance (Feet): 70 Feet Assistive device: Rolling walker (2 wheels) Gait Pattern/deviations: Step-through pattern, Decreased stride length, Trunk flexed, Narrow base of support Gait velocity: slow     General Gait Details: very slow gait speed with trunk flexed to almost 90 degrees throughout, pt unable to correct despite multimodal cues, min A to maintain balance    Stairs             Wheelchair Mobility     Tilt Bed    Modified Rankin (Stroke Patients Only) Modified Rankin (Stroke Patients Only) Pre-Morbid Rankin Score: Moderate disability Modified Rankin: Moderately severe disability     Balance Overall balance assessment: Needs assistance, History of Falls Sitting-balance support: No upper extremity supported, Bilateral upper extremity supported, Feet supported Sitting balance-Leahy Scale: Good Sitting balance - Comments: EOB   Standing balance support: Single extremity supported, Bilateral upper extremity supported, During functional activity Standing balance-Leahy Scale: Poor Standing balance comment: reliant on RW support                            Cognition Arousal: Alert Behavior During Therapy: WFL for tasks assessed/performed Overall Cognitive Status: History of  cognitive impairments - at baseline Area of Impairment: Orientation, Memory, Following commands, Safety/judgement, Problem solving                 Orientation Level: Disoriented to, Place, Time,  Situation Current Attention Level: Sustained Memory: Decreased short-term memory Following Commands: Follows one step commands with increased time Safety/Judgement: Decreased awareness of deficits, Decreased awareness of safety   Problem Solving: Slow processing General Comments: required verbal cues for sequencing        Exercises      General Comments General comments (skin integrity, edema, etc.): pt with bowel incontinence on standing      Pertinent Vitals/Pain Pain Assessment Pain Assessment: Faces Faces Pain Scale: Hurts a little bit Pain Location: BLEs during mobility Pain Descriptors / Indicators: Grimacing, Other (Comment) (pt stating "ow") Pain Intervention(s): Monitored during session, Limited activity within patient's tolerance, Repositioned    Home Living                          Prior Function            PT Goals (current goals can now be found in the care plan section) Acute Rehab PT Goals Patient Stated Goal: family agreeable to rehab PT Goal Formulation: With patient/family Time For Goal Achievement: 03/10/23 Progress towards PT goals: Progressing toward goals    Frequency    Min 1X/week      PT Plan      Co-evaluation              AM-PAC PT "6 Clicks" Mobility   Outcome Measure  Help needed turning from your back to your side while in a flat bed without using bedrails?: A Little Help needed moving from lying on your back to sitting on the side of a flat bed without using bedrails?: A Little Help needed moving to and from a bed to a chair (including a wheelchair)?: A Little Help needed standing up from a chair using your arms (e.g., wheelchair or bedside chair)?: A Little Help needed to walk in hospital room?: A Little Help needed climbing 3-5 steps with a railing? : Total 6 Click Score: 16    End of Session Equipment Utilized During Treatment: Gait belt Activity Tolerance: Patient tolerated treatment well Patient  left: in chair;with call bell/phone within reach;with chair alarm set;with family/visitor present Nurse Communication: Mobility status PT Visit Diagnosis: Other abnormalities of gait and mobility (R26.89);Muscle weakness (generalized) (M62.81);Other symptoms and signs involving the nervous system (R29.898)     Time: 1000-1025 PT Time Calculation (min) (ACUTE ONLY): 25 min  Charges:    $Gait Training: 8-22 mins $Therapeutic Activity: 8-22 mins PT General Charges $$ ACUTE PT VISIT: 1 Visit                     Sunshine Mackowski R. PTA Acute Rehabilitation Services Office: [phone]   Catalina Antigua 03/02/2023, 12:19 PM

## 2023-03-02 NOTE — Progress Notes (Addendum)
Inpatient Rehab Admissions Coordinator:   Addendum: Sent clinical information to insurance for prior approval.   Awaiting updated PT note to submit information to insurance for prior approval to CIR. Will continue to follow.   Rehab Admissons Coordinator [place], [place], [place] [phone]

## 2023-03-02 NOTE — Progress Notes (Signed)
Speech Language Pathology Treatment: Cognitive-Linquistic  Patient Details Name: Christine Henderson MRN: [id number] DOB: February 16, 1931 Today's Date: 03/02/2023 Time: 7829-5621 SLP Time Calculation (min) (ACUTE ONLY): 29 min  Assessment / Plan / Recommendation Clinical Impression  Pt seen for cognitive/linguistic tx focusing on aphasia goals, specifically responsive naming/object identification with functional items within room and family present (son, Jeannett Senior).  Familiar/personal information questions also addressed during session with phrase completion, phonemic/semantic cues provided by SLP and mod verbal/visual cues.  Pt able to indicate names of common objects with above mentioned cueing with an accuracy obtained of 30% overall.  Pt recalled 1/4 names of family members with mod verbal/phonemic/visual cues provided. Personal information questions yielded an accuracy of 25%.  Pt used neologisms and semantic paraphasias during speaking tasks.  She did state "I need a nap" and son informed clinician pt had just finished with a PT session prior to clinician's arrival.  Decreased awareness of deficits noted and likely exacerbation of prior cognitive functioning.  Recommend ST continue to f/u in acute setting and at next venue of care with CIR vs SNF consideration pending.     HPI HPI: 89 yo female presents to [facility] on [facility]/23 with fall at home. CTH reveals multifocal L hemisphere acute hemorrhagic contusions, largest is 3.5 cm L temporal with small amount of overlying subarachnoid blood and IVH. PMH includes HLD, HTN, peripheral neuropathy, osteopenia, recurrent falls, disequilibrium, IBS, mild Alzheimer's per son's report. ST evaluated speech/language and cognitive function with new onset aphasia.  ST f/u for cog/ling tx.      SLP Plan  Continue with current plan of care      Recommendations for follow up therapy are one component of a multi-disciplinary discharge planning process, led by the attending  physician.  Recommendations may be updated based on patient status, additional functional criteria and insurance authorization.    Recommendations                   Other(comment) (CIR vs SNF)     Frequent or constant Supervision/Assistance Aphasia (R47.01);Cognitive communication deficit (R41.841)     Continue with current plan of care     Pat Saya Mccoll,M.S.,CCC-SLP  03/02/2023, 12:21 PM

## 2023-03-02 NOTE — TOC Progression Note (Signed)
Transition of Care [facility]) - Progression Note    Patient Details  Name: GELILA KRUM MRN: [id number] Date of Birth: 12/01/1930  Transition of Care [facility]) CM/SW Contact  Kermit Balo, RN Phone Number: 03/02/2023, 2:02 PM  Clinical Narrative:     CIR submitting for insurance approval.  TOC following.  Expected Discharge Plan: IP Rehab Facility Barriers to Discharge: Continued Medical Work up, Conservator, museum/gallery and Services In-house Referral: Clinical Social Work   Post Acute Care Choice: IP Rehab Living arrangements for the past 2 months: Single Family Home                                       Social Determinants of Health (SDOH) Interventions SDOH Screenings   Food Insecurity: Patient Unable To Answer (02/24/2023)  Housing: Patient Unable To Answer (02/24/2023)  Transportation Needs: Patient Unable To Answer (02/24/2023)  Utilities: Patient Unable To Answer (02/24/2023)  Alcohol Screen: Low Risk  (10/11/2020)  Depression (PHQ2-9): Low Risk  (01/28/2022)  Financial Resource Strain: Low Risk  (10/11/2020)  Physical Activity: Insufficiently Active (10/11/2020)  Social Connections: Moderately Isolated (10/11/2020)  Stress: No Stress Concern Present (10/11/2020)  Tobacco Use: Low Risk  (02/05/2023)    Readmission Risk Interventions     No data to display

## 2023-03-03 DIAGNOSIS — I619 Nontraumatic intracerebral hemorrhage, unspecified: Secondary | ICD-10-CM | POA: Diagnosis not present

## 2023-03-03 MED ORDER — LABETALOL HCL 5 MG/ML IV SOLN: 5.0000 mg | INTRAVENOUS | Status: DC | PRN

## 2023-03-03 NOTE — Progress Notes (Signed)
 PROGRESS NOTE Christine Henderson  FMW:[id number] DOB: [birth year] DOA: 02/23/2023 PCP: Domenica Harlene LABOR, MD  Brief Narrative/Hospital Course: Christine Henderson is a 89 y.o. female with a history of primary hypertension, peripheral neuropathy, hyperlipidemia, dementia.  Patient presented secondary to being found down and found to have evidence of an intracerebral hemorrhage with incidental finding of an acute ischemic stroke. Patient admitted by neurology to ICU. Neurosurgery consulted with recommendations for no neurosurgical intervention. Patient managed off of antithrombotic therapy . Neurology has seen the patient.  Patient remains weak and deconditioned and planning for CIR Awaiting for CIR approval and remains medically stable   Subjective: Seen this am Son at bedside no complaints Having breakfast pleasantly confused at baseline but weak  Assessment and Plan: Principal Problem:   Hemorrhagic stroke [facility]) Active Problems:   Hyperlipidemia   Mild dementia   Cerebral amyloid angiopathy (HCC)   Pressure injury of skin   Multifocal ICH likely traumatic versus CAA related Acute ischemic stroke incidental finding on left frontal white matter: completed workup for ICH and stroke with MRI, MRA, carotid Doppler, LDL 89 A1c 6.4 UDS negative -see report in chartt chart check.No antithrombotic due to ICH. Cont on PT OT>awiting CIR  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 In differential diagnosis per neurology.Recommendation for no antithrombotics.   Frequent PACs: asymptomatic.Monitor   Primary hypertension:  Bp well-controlled,losartan  was increased to 50 mg 03/02/23  for Goal SBP < 140 mmHg   Diastolic heart dysfunction G1 DD on echo: Asymptomatic-and euvolemic.  HLD: continue simvastatin  and omega-3 fatty acid outpatient   Dementia Alert awake and pleasantly confused and remains at baseline oriented to self and family. As per son overall she is worse from baseline since ICH but remains stable.  Goals of  care: Patient's son has discussed with patient family and has renewed her living will and requesting DNR 12/29  Right lateral stage I pressure ulcer POA on hip , continue wound care Pressure Injury 02/23/23 Hip Right;Lateral Stage 1 -  Intact skin with non-blanchable redness of a localized area usually over a bony prominence. Redness/Irritation upon Initial Head-to-Toe Assessment (Active)  02/23/23 2330  Location: Hip  Location Orientation: Right;Lateral  Staging: Stage 1 -  Intact skin with non-blanchable redness of a localized area usually over a bony prominence.  Wound Description (Comments): Redness/Irritation upon Initial Head-to-Toe Assessment  Present on Admission: Yes   DVT prophylaxis: heparin  injection 5,000 Units Start: 02/25/23 1115 SCD's Start: 02/23/23 2251 Code Status:   Code Status: Limited: Do not attempt resuscitation (DNR) -DNR-LIMITED -Do Not Intubate/DNI  Family Communication: plan of care discussed with patient/son at bedside updated again Patient status is: Remains hospitalized because of severity of illness Level of care: Telemetry Medical   Dispo: The patient is from: Home by herself with aids daily.            Anticipated disposition: CIR once available.  She is medically stable  Objective: Vitals last 24 hrs: Vitals:   03/02/23 1655 03/02/23 2011 03/03/23 0415 03/03/23 0850  BP:  110/60 (!) 149/104 (!) 146/105  Pulse:  92 86 89  Resp:  18 16   Temp:  98.7 F (37.1 C) 98.6 F (37 C) 98.3 F (36.8 C)  TempSrc:  Oral  Oral  SpO2:  95% 100% 94%  Weight: 54.3 kg     Height: 5' 2 (1.575 m)      Weight change:   Physical Examination: General exam: alert awake, oriented to self family HEENT:Oral mucosa moist, Ear/Nose WNL grossly  Respiratory system: Bilaterally clear BS,no use of accessory muscle Cardiovascular system: S1 & S2 +, No JVD. Gastrointestinal system: Abdomen soft,NT,ND, BS+ Nervous System: Alert, awake, moving all extremities,and following  commands. Extremities: LE edema neg,distal peripheral pulses palpable and warm.  Skin: No rashes,no icterus. MSK: weak muscle and low bulk,tone, power   Medications reviewed:  Scheduled Meds:  donepezil   10 mg Oral QHS   heparin  injection (subcutaneous)  5,000 Units Subcutaneous Q12H   losartan   50 mg Oral Daily   pantoprazole   40 mg Oral QHS   senna-docusate  1 tablet Oral BID   simvastatin   10 mg Oral QHS   Continuous Infusions:  Diet Order             Diet regular Room service appropriate? Yes with Assist; Fluid consistency: Thin  Diet effective now                  Intake/Output Summary (Last 24 hours) at 03/03/2023 0936 Last data filed at 03/03/2023 0415 Gross per 24 hour  Intake --  Output 800 ml  Net -800 ml   Net IO Since Admission: 1,971.49 mL [03/03/23 0936]  Wt Readings from Last 3 Encounters:  03/02/23 54.3 kg  11/28/22 58.5 kg  11/21/22 55.3 kg   Unresulted Labs (From admission, onward)    None     Data Reviewed: I have personally reviewed following labs and imaging studies Recent Labs  Lab 02/25/23 0509 02/26/23 0515 02/27/23 0624 02/28/23 0443 03/01/23 0503  WBC 11.9* 9.0 10.4 9.4 9.4  HGB 12.5 12.6 13.1 12.9 12.4  HCT 37.2 37.8 39.2 38.1 36.6  MCV 93.0 94.5 94.5 93.8 93.6  PLT 242 256 282 289 276   Basic Metabolic Panel:  Recent Labs  Lab 02/25/23 0509 02/26/23 0515 02/27/23 0624 02/28/23 0443 03/01/23 0503  NA 137 138 136 135 134*  K 3.7 3.7 3.5 4.3 3.7  CL 107 106 104 104 104  CO2 24 24 22 23 23   GLUCOSE 116* 116* 123* 122* 123*  BUN 35* 27* 16 19 18   CREATININE 0.92 0.92 0.90 0.96 0.86  CALCIUM  8.7* 8.7* 8.7* 8.8* 8.6*   GFR: Estimated Creatinine Clearance: 34.4 mL/min (by C-G formula based on SCr of 0.86 mg/dL). Liver Function Tests:  No results for input(s): AST, ALT, ALKPHOS, BILITOT, PROT, ALBUMIN in the last 168 hours. No results for input(s): PROCALCITON, LATICACIDVEN in the last 168 hours. Recent  Results (from the past 240 hours)  MRSA Next Gen by PCR, Nasal     Status: None   Collection Time: 02/23/23 11:37 PM   Specimen: Nasal Mucosa; Nasal Swab  Result Value Ref Range Status   MRSA by PCR Next Gen NOT DETECTED NOT DETECTED Final    Comment: (NOTE) The GeneXpert MRSA Assay (FDA approved for NASAL specimens only), is one component of a comprehensive MRSA colonization surveillance program. It is not intended to diagnose MRSA infection nor to guide or monitor treatment for MRSA infections. Test performance is not FDA approved in patients less than 76 years old. Performed at [facility], [address] [place], [place] [address]   Antimicrobials/Microbiology: Anti-infectives (From admission, onward)    None         Component Value Date/Time   SDES  11/19/2022 1803    BLOOD RIGHT ANTECUBITAL Performed at [facility], [address] [place], [place] [address]    [facility]  11/19/2022 1803    BOTTLES DRAWN AEROBIC AND ANAEROBIC  Blood Culture adequate volume Performed at [facility], [address] [place], [place] [address]    CULT  11/19/2022 1803    NO GROWTH 5 DAYS Performed at [facility], [address], [place], [place] [address]    REPTSTATUS 11/24/2022 FINAL 11/19/2022 1803   Radiology Studies: No results found.   LOS: 8 days   Total time spent in review of labs and imaging, patient evaluation, formulation of plan, documentation and communication with family: 35 minutes  Mennie LAMY, MD [facility]  03/03/2023, 9:36 AM

## 2023-03-03 NOTE — Plan of Care (Signed)
 No acyte changes, awaiting insurance auth for CIR   Problem: Intracerebral Hemorrhage Tissue Perfusion: Goal: Complications of Intracerebral Hemorrhage will be minimized Outcome: Not Met (add Reason)   Problem: Education: Goal: Knowledge of disease or condition will improve Outcome: Not Met (add Reason)   Problem: Self-Care: Goal: Ability to participate in self-care as condition permits will improve Outcome: Not Met (add Reason) Goal: Verbalization of feelings and concerns over difficulty with self-care will improve Outcome: Not Met (add Reason) Goal: Ability to communicate needs accurately will improve Outcome: Not Met (add Reason)   Problem: Nutrition: Goal: Risk of aspiration will decrease Outcome: Not Met (add Reason) Goal: Dietary intake will improve Outcome: Not Met (add Reason)   Problem: Safety: Goal: Ability to remain free from injury will improve Outcome: Not Met (add Reason)   Problem: Pain Management: Goal: General experience of comfort will improve Outcome: Not Met (add Reason)

## 2023-03-03 NOTE — Plan of Care (Signed)
  Problem: Education: Goal: Knowledge of disease or condition will improve Outcome: Progressing Goal: Knowledge of secondary prevention will improve (MUST DOCUMENT ALL) Outcome: Progressing Goal: Knowledge of patient specific risk factors will improve [name] N/A or DELETE if not current risk factor) Outcome: Progressing   Problem: Intracerebral Hemorrhage Tissue Perfusion: Goal: Complications of Intracerebral Hemorrhage will be minimized Outcome: Progressing   Problem: Coping: Goal: Will verbalize positive feelings about self Outcome: Progressing Goal: Will identify appropriate support needs Outcome: Progressing   Problem: Health Behavior/Discharge Planning: Goal: Ability to manage health-related needs will improve Outcome: Progressing Goal: Goals will be collaboratively established with patient/family Outcome: Progressing   Problem: Self-Care: Goal: Ability to participate in self-care as condition permits will improve Outcome: Progressing Goal: Verbalization of feelings and concerns over difficulty with self-care will improve Outcome: Progressing Goal: Ability to communicate needs accurately will improve Outcome: Progressing   Problem: Nutrition: Goal: Risk of aspiration will decrease Outcome: Progressing Goal: Dietary intake will improve Outcome: Progressing   Problem: Education: Goal: Knowledge of General Education information will improve Description: Including pain rating scale, medication(s)/side effects and non-pharmacologic comfort measures Outcome: Progressing   Problem: Health Behavior/Discharge Planning: Goal: Ability to manage health-related needs will improve Outcome: Progressing   Problem: Clinical Measurements: Goal: Ability to maintain clinical measurements within normal limits will improve Outcome: Progressing Goal: Will remain free from infection Outcome: Progressing Goal: Diagnostic test results will improve Outcome: Progressing Goal:  Respiratory complications will improve Outcome: Progressing Goal: Cardiovascular complication will be avoided Outcome: Progressing   Problem: Activity: Goal: Risk for activity intolerance will decrease Outcome: Progressing   Problem: Nutrition: Goal: Adequate nutrition will be maintained Outcome: Progressing   Problem: Coping: Goal: Level of anxiety will decrease Outcome: Progressing   Problem: Elimination: Goal: Will not experience complications related to bowel motility Outcome: Progressing Goal: Will not experience complications related to urinary retention Outcome: Progressing   Problem: Pain Management: Goal: General experience of comfort will improve Outcome: Progressing   Problem: Safety: Goal: Ability to remain free from injury will improve Outcome: Progressing   Problem: Skin Integrity: Goal: Risk for impaired skin integrity will decrease Outcome: Progressing

## 2023-03-03 NOTE — Progress Notes (Signed)
 Physical Therapy Treatment Patient Details Name: Christine Henderson MRN: [id number] DOB: 04-17-1930 Today's Date: 03/03/2023   History of Present Illness 89 yo female presents to [facility] on 12/23 with fall at home. CTH reveals multifocal L hemisphere acute hemorrhagic contusions, largest is 3.5 cm L temporal with small amount of overlying subarachnoid blood and IVH. MRI punctate acute ischemic stroke in left frontal white matter.  PMH includes HLD, HTN, peripheral neuropathy, osteopenia, recurrent falls, disequilibrium, IBS, mild dementia.    PT Comments  Patient required incr assist with bed mobility (mod assist from flat bed, no rails). She stood with mod assist and RW and noted to have had BM. Returned to sitting EOB to gather supplies for pericare. Stood again with RW and as PT providing pericare, she began leaning posteriorly (after ~1 minute). Cued her to go ahead and sit down and she would not respond to cues. Physically assisted her to sit EOB. She was lethargic and not able to state her name (which she had previously). Called for assistance with RN responding. BP taken in sitting (after sitting ~4 minutes) 144/72. ?? Was orthostatic when standing. Pt more alert, but still not at baseline and required further pericare. Returned to supine and assisted nursing with rolling and cleaning pt. Son present throughout session.    If plan is discharge home, recommend the following: Assistance with cooking/housework;Assist for transportation;Help with stairs or ramp for entrance;Two people to help with walking and/or transfers;Direct supervision/assist for medications management;Direct supervision/assist for financial management;Supervision due to cognitive status   Can travel by private vehicle     Yes  Equipment Recommendations  None recommended by PT    Recommendations for Other Services       Precautions / Restrictions Precautions Precautions: Fall Precaution Comments: SBP<  160 Restrictions Weight Bearing Restrictions Per Provider Order: No     Mobility  Bed Mobility Overal bed mobility: Needs Assistance Bed Mobility: Supine to Sit, Sit to Supine     Supine to sit: Mod assist Sit to supine: Max assist, +2 for physical assistance   General bed mobility comments: able to come to sit upright with min assist however needed mod assist to scoot to EOB; ?orthostatic with decr responsiveness and incr assist to return to supine    Transfers Overall transfer level: Needs assistance Equipment used: Rolling walker (2 wheels) Transfers: Sit to/from Stand Sit to Stand: Mod assist           General transfer comment: mod assist  to power up x 2 reps    Ambulation/Gait               General Gait Details: unable due to episode of decr responsiveness while standing for pericare   Stairs             Wheelchair Mobility     Tilt Bed    Modified Rankin (Stroke Patients Only) Modified Rankin (Stroke Patients Only) Pre-Morbid Rankin Score: Moderate disability Modified Rankin: Severe disability     Balance Overall balance assessment: Needs assistance, History of Falls Sitting-balance support: No upper extremity supported, Bilateral upper extremity supported, Feet supported Sitting balance-Leahy Scale: Poor Sitting balance - Comments: EOB after decr responsiveness up to mod assist   Standing balance support: Bilateral upper extremity supported, During functional activity Standing balance-Leahy Scale: Poor Standing balance comment: reliant on RW support  Cognition Arousal: Alert Behavior During Therapy: WFL for tasks assessed/performed Overall Cognitive Status: History of cognitive impairments - at baseline Area of Impairment: Orientation, Memory, Following commands, Safety/judgement, Problem solving, Attention                 Orientation Level: Disoriented to, Place, Time, Situation Current  Attention Level: Sustained Memory: Decreased short-term memory Following Commands: Follows one step commands with increased time, Follows one step commands inconsistently Safety/Judgement: Decreased awareness of deficits, Decreased awareness of safety   Problem Solving: Slow processing, Decreased initiation, Requires verbal cues, Requires tactile cues General Comments: able to state her name only; unable to state DOB; slow processing and inability to follow simple commands at times (?orthostasis)        Exercises General Exercises - Lower Extremity Long Arc Quad: AROM, Both, 10 reps, Seated    General Comments        Pertinent Vitals/Pain Pain Assessment Pain Assessment: Faces Faces Pain Scale: Hurts little more Pain Location: bottom per pt Pain Descriptors / Indicators: Discomfort, Grimacing Pain Intervention(s): Limited activity within patient's tolerance    Home Living                          Prior Function            PT Goals (current goals can now be found in the care plan section) Acute Rehab PT Goals Patient Stated Goal: family agreeable to rehab Time For Goal Achievement: 03/10/23 Potential to Achieve Goals: Fair Progress towards PT goals: Not progressing toward goals - comment (?orthostasis)    Frequency    Min 1X/week      PT Plan      Co-evaluation              AM-PAC PT 6 Clicks Mobility   Outcome Measure  Help needed turning from your back to your side while in a flat bed without using bedrails?: A Little Help needed moving from lying on your back to sitting on the side of a flat bed without using bedrails?: A Lot Help needed moving to and from a bed to a chair (including a wheelchair)?: A Lot Help needed standing up from a chair using your arms (e.g., wheelchair or bedside chair)?: A Lot Help needed to walk in hospital room?: Total Help needed climbing 3-5 steps with a railing? : Total 6 Click Score: 11    End of  Session Equipment Utilized During Treatment: Gait belt Activity Tolerance: Treatment limited secondary to medical complications (Comment) (decr responsiveness in standing) Patient left: with call bell/phone within reach;with family/visitor present;in bed;with bed alarm set Nurse Communication: Mobility status;Other (comment) (?orthostasis; decr responsiveness in standing) PT Visit Diagnosis: Other abnormalities of gait and mobility (R26.89);Muscle weakness (generalized) (M62.81);Other symptoms and signs involving the nervous system (R29.898)     Time: 8890-8860 PT Time Calculation (min) (ACUTE ONLY): 30 min  Charges:    $Therapeutic Activity: 23-37 mins PT General Charges $$ ACUTE PT VISIT: 1 Visit                      Macario RAMAN, PT Acute Rehabilitation Services  Office [phone]    Macario SHAUNNA Soja 03/03/2023, 11:57 AM

## 2023-03-04 DIAGNOSIS — I619 Nontraumatic intracerebral hemorrhage, unspecified: Secondary | ICD-10-CM | POA: Diagnosis not present

## 2023-03-04 LAB — MAGNESIUM: Magnesium: 1.8 mg/dL (ref 1.7–2.4)

## 2023-03-04 MED ORDER — POTASSIUM CHLORIDE 20 MEQ PO PACK
40.0000 meq | PACK | Freq: Once | ORAL | Status: AC
  Administered 2023-03-04: 40 meq via ORAL
  Filled 2023-03-04: qty 2

## 2023-03-04 NOTE — Plan of Care (Signed)
   Problem: Activity: Goal: Risk for activity intolerance will decrease Outcome: Progressing   Problem: Coping: Goal: Level of anxiety will decrease Outcome: Progressing   Problem: Safety: Goal: Ability to remain free from injury will improve Outcome: Progressing   Problem: Skin Integrity: Goal: Risk for impaired skin integrity will decrease Outcome: Progressing

## 2023-03-04 NOTE — Plan of Care (Signed)
 Still confused, though calm and follows command.  Problem: Health Behavior/Discharge Planning: Goal: Ability to manage health-related needs will improve Outcome: Progressing   Problem: Self-Care: Goal: Ability to communicate needs accurately will improve Outcome: Progressing   Problem: Nutrition: Goal: Dietary intake will improve Outcome: Progressing   Problem: Safety: Goal: Ability to remain free from injury will improve Outcome: Progressing   Problem: Skin Integrity: Goal: Risk for impaired skin integrity will decrease Outcome: Progressing

## 2023-03-04 NOTE — TOC Progression Note (Signed)
 Transition of Care [facility]) - Progression Note    Patient Details  Name: Christine Henderson MRN: [id number] Date of Birth: 12-19-1930  [facility] of [facility]) CM/SW Contact  Andrez JULIANNA George, RN Phone Number: 03/04/2023, 2:47 PM  Clinical Narrative:     CM met with the patient and her son at the bedside. Son upset that the process to get the patient to CIR is taking so long. CM went over that we are waiting on insurance authorization and with the holiday it is taking longer. He feels she is getting weaker being here and not getting rehab type therapy. He states that if we don't get her a bed on CIR by tomorrow, end of day, he wants to take her to an outside rehab facility. CM went over that this would still need to be approved through her insurance unless the family plans to private pay. The SNF would also need information from the hospital to be able to admit her. CM did provide him with bed offers for SNF so he can look them over as a back up for CIR. CM will reach out to patinets CIR rep  in the am to see if we can get him more information on the authorization through [facility].  TOC following.   Expected Discharge Plan: IP Rehab Facility Barriers to Discharge: Continued Medical Work up, Conservator, Museum/gallery and Services In-house Referral: Clinical Social Work   Post Acute Care Choice: IP Rehab Living arrangements for the past 2 months: Single Family Home                                       Social Determinants of Health (SDOH) Interventions SDOH Screenings   Food Insecurity: Patient Unable To Answer (02/24/2023)  Housing: Patient Unable To Answer (02/24/2023)  Transportation Needs: Patient Unable To Answer (02/24/2023)  Utilities: Patient Unable To Answer (02/24/2023)  Alcohol Screen: Low Risk  (10/11/2020)  Depression (PHQ2-9): Low Risk  (01/28/2022)  Financial Resource Strain: Low Risk  (10/11/2020)  Physical Activity: Insufficiently Active  (10/11/2020)  Social Connections: Patient Unable To Answer (03/03/2023)  Stress: No Stress Concern Present (10/11/2020)  Tobacco Use: Low Risk  (02/05/2023)    Readmission Risk Interventions     No data to display

## 2023-03-04 NOTE — Progress Notes (Signed)
 PROGRESS NOTE Christine Henderson  FMW:[id number] DOB: [birth year] DOA: 02/23/2023 PCP: Domenica Harlene LABOR, MD  Brief Narrative/Hospital Course: Christine Henderson is a 89 y.o. female with a history of primary hypertension, peripheral neuropathy, hyperlipidemia, dementia.  Patient presented secondary to being found down and found to have evidence of an intracerebral hemorrhage with incidental finding of an acute ischemic stroke. Patient admitted by neurology to ICU. Neurosurgery consulted with recommendations for no neurosurgical intervention. Patient managed off of antithrombotic therapy . Neurology has seen the patient.  Patient remains weak and deconditioned and planning for CIR Awaiting for CIR approval and remains medically stable   Subjective: Patient seen and examined  Nursing at the bedside and she got changed and setting up for breakfast   Assessment and Plan: Principal Problem:   Hemorrhagic stroke [facility]) Active Problems:   Hyperlipidemia   Mild dementia   Cerebral amyloid angiopathy (HCC)   Pressure injury of skin   Multifocal ICH likely traumatic versus CAA related Acute ischemic stroke incidental finding on left frontal white matter: completed workup for ICH and stroke with MRI, MRA, carotid Doppler, LDL 89 A1c 6.4 UDS negative -see report in chart for detail.No antithrombotic due to ICH. Cont on PT OT>awiting CIR  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 In differential diagnosis per neurology.Recommendation for no antithrombotics.   Frequent PACs: asymptomatic.Monitor   Primary hypertension:  BP well-controlled after increasing losartan  25mg > 50 mg 03/02/23 for Goal SBP < 140 mmHg   Diastolic heart dysfunction G1 DD on echo: Asymptomatic-and euvolemic.  HLD: continue simvastatin  and omega-3 fatty acid outpatient   Dementia Alert awake and pleasantly confused and remains at baseline oriented to self and family able to eat. As per son overall she is worse from baseline since ICH but remains  stable.  Goals of care: Patient's son has discussed with patient family and has renewed her living will and requesting DNR 12/29  Right lateral stage I pressure ulcer POA on hip , continue wound care Pressure Injury 02/23/23 Hip Right;Lateral Stage 1 -  Intact skin with non-blanchable redness of a localized area usually over a bony prominence. Redness/Irritation upon Initial Head-to-Toe Assessment (Active)  02/23/23 2330  Location: Hip  Location Orientation: Right;Lateral  Staging: Stage 1 -  Intact skin with non-blanchable redness of a localized area usually over a bony prominence.  Wound Description (Comments): Redness/Irritation upon Initial Head-to-Toe Assessment  Present on Admission: Yes   DVT prophylaxis: heparin  injection 5,000 Units Start: 02/25/23 1115 SCD's Start: 02/23/23 2251 Code Status:   Code Status: Limited: Do not attempt resuscitation (DNR) -DNR-LIMITED -Do Not Intubate/DNI  Family Communication: plan of care discussed with patient/son was updated at the bedside 12/31, no family today  Patient status is: Remains hospitalized because of severity of illness Level of care: Telemetry Medical   Dispo: The patient is from: Home by herself with aids daily.            Anticipated disposition: CIR once available.  She is medically stable  Objective: Vitals last 24 hrs: Vitals:   03/03/23 2001 03/03/23 2323 03/04/23 0404 03/04/23 0816  BP: 115/71 127/72 135/73 (!) 138/91  Pulse: 93 89 76 82  Resp: 18 18 18    Temp: 98.9 F (37.2 C) 98.8 F (37.1 C) 98.3 F (36.8 C) 98.5 F (36.9 C)  TempSrc: Oral Oral Oral Oral  SpO2:  97% 96% 99%  Weight:      Height:       Weight change:   Physical Examination: General exam: alert awake,  elderly frail oriented x 1-2 HEENT:Oral mucosa moist, Ear/Nose WNL grossly Respiratory system: Bilaterally clear BS,no use of accessory muscle Cardiovascular system: S1 & S2 +, No JVD. Gastrointestinal system: Abdomen soft,NT,ND, BS+ Nervous  System: Alert, awake, moving all extremities,and following commands. Extremities: LE edema neg,distal peripheral pulses palpable and warm.  Skin: No rashes,no icterus. MSK: Normal muscle bulk,tone, power   Medications reviewed:  Scheduled Meds:  donepezil   10 mg Oral QHS   heparin  injection (subcutaneous)  5,000 Units Subcutaneous Q12H   losartan   50 mg Oral Daily   pantoprazole   40 mg Oral QHS   senna-docusate  1 tablet Oral BID   simvastatin   10 mg Oral QHS   Continuous Infusions:  Diet Order             Diet regular Room service appropriate? Yes with Assist; Fluid consistency: Thin  Diet effective now                  Intake/Output Summary (Last 24 hours) at 03/04/2023 0827 Last data filed at 03/04/2023 0505 Gross per 24 hour  Intake 480 ml  Output 1350 ml  Net -870 ml   Net IO Since Admission: 1,101.49 mL [03/04/23 0827]  Wt Readings from Last 3 Encounters:  03/02/23 54.3 kg  11/28/22 58.5 kg  11/21/22 55.3 kg   Unresulted Labs (From admission, onward)     Start     Ordered   03/04/23 0430  Magnesium  Once,   R        03/04/23 0430          Data Reviewed: I have personally reviewed following labs and imaging studies Recent Labs  Lab 02/26/23 0515 02/27/23 0624 02/28/23 0443 03/01/23 0503  WBC 9.0 10.4 9.4 9.4  HGB 12.6 13.1 12.9 12.4  HCT 37.8 39.2 38.1 36.6  MCV 94.5 94.5 93.8 93.6  PLT 256 282 289 276   Basic Metabolic Panel:  Recent Labs  Lab 02/26/23 0515 02/27/23 0624 02/28/23 0443 03/01/23 0503  NA 138 136 135 134*  K 3.7 3.5 4.3 3.7  CL 106 104 104 104  CO2 24 22 23 23   GLUCOSE 116* 123* 122* 123*  BUN 27* 16 19 18   CREATININE 0.92 0.90 0.96 0.86  CALCIUM  8.7* 8.7* 8.8* 8.6*   GFR: Estimated Creatinine Clearance: 34.4 mL/min (by C-G formula based on SCr of 0.86 mg/dL). Liver Function Tests:  No results for input(s): AST, ALT, ALKPHOS, BILITOT, PROT, ALBUMIN in the last 168 hours. No results for input(s):  PROCALCITON, LATICACIDVEN in the last 168 hours. Recent Results (from the past 240 hours)  MRSA Next Gen by PCR, Nasal     Status: None   Collection Time: 02/23/23 11:37 PM   Specimen: Nasal Mucosa; Nasal Swab  Result Value Ref Range Status   MRSA by PCR Next Gen NOT DETECTED NOT DETECTED Final    Comment: (NOTE) The GeneXpert MRSA Assay (FDA approved for NASAL specimens only), is one component of a comprehensive MRSA colonization surveillance program. It is not intended to diagnose MRSA infection nor to guide or monitor treatment for MRSA infections. Test performance is not FDA approved in patients less than 31 years old. Performed at [facility], [address] [place], [place] [address]   Antimicrobials/Microbiology: Anti-infectives (From admission, onward)    None         Component Value Date/Time   SDES  11/19/2022 1803    BLOOD RIGHT ANTECUBITAL Performed at [facility]  [address], [address] [place], [place] [address]    [facility]  11/19/2022 1803    BOTTLES DRAWN AEROBIC AND ANAEROBIC Blood Culture adequate volume Performed at [facility], [address] [place], [place] [address]    CULT  11/19/2022 1803    NO GROWTH 5 DAYS Performed at [facility], [address], [place], [place] [address]    REPTSTATUS 11/24/2022 FINAL 11/19/2022 1803   Radiology Studies: No results found.   LOS: 9 days   Total time spent in review of labs and imaging, patient evaluation, formulation of plan, documentation and communication with family: 25 minutes  Christine LAMY, MD [facility]  03/04/2023, 8:27 AM

## 2023-03-05 DIAGNOSIS — I619 Nontraumatic intracerebral hemorrhage, unspecified: Secondary | ICD-10-CM | POA: Diagnosis not present

## 2023-03-05 MED ORDER — HYDRALAZINE HCL 20 MG/ML IJ SOLN: 10.0000 mg | INTRAMUSCULAR | Status: DC | PRN

## 2023-03-05 NOTE — Progress Notes (Signed)
 Occupational Therapy Treatment Patient Details Name: Christine Henderson MRN: [id number] DOB: [birth year] Today's Date: 03/05/2023   History of present illness 89 yo female [facility] with fall at home. CTH reveals multifocal L hemisphere acute hemorrhagic contusions, largest is 3.5 cm L temporal with small amount of overlying subarachnoid blood and IVH. MRI punctate acute ischemic stroke in left frontal white matter.  PMH includes HLD, HTN, peripheral neuropathy, osteopenia, recurrent falls, disequilibrium, IBS, mild dementia.   OT comments  Pt is making solid progress towards their acute OT goals. Pt see with PT to progress activity tolerance. Overall she needed mod-min A +2 for transfers and min A +2 for functional mobility with cues for encouragement and self monitoring symptoms (SOB). She required 1x sitting rest break with VSS. She tolerated standing at the sink with BUE supported during rear peri care with max A. Encouraged nsg staff to assist pt to/from bathroom throughout the day, RN agreeable. OT to continue to follow acutely to facilitate progress towards established goals. Pt will continue to benefit from intensive inpatient follow up therapy, >3 hours/day after discharge.         If plan is discharge home, recommend the following:  A lot of help with walking and/or transfers;A lot of help with bathing/dressing/bathroom;Assist for transportation;Supervision due to cognitive status;Direct supervision/assist for financial management;Direct supervision/assist for medications management   Equipment Recommendations  Other (comment)    Recommendations for Other Services Rehab consult    Precautions / Restrictions Precautions Precautions: Fall Precaution Comments: SBP< 160, incontinent bowel Restrictions Weight Bearing Restrictions Per Provider Order: No       Mobility Bed Mobility Overal bed mobility: Needs Assistance Bed Mobility: Supine to Sit, Sit to Supine      Supine to sit: Min assist, +2 for physical assistance          Transfers Overall transfer level: Needs assistance Equipment used: Rolling walker (2 wheels) Transfers: Sit to/from Stand Sit to Stand: Mod assist, +2 safety/equipment, Min assist           General transfer comment: mod A +2 for initial stand, progressed to min A +2     Balance Overall balance assessment: Needs assistance, History of Falls Sitting-balance support: No upper extremity supported, Bilateral upper extremity supported, Feet supported Sitting balance-Leahy Scale: Poor       Standing balance-Leahy Scale: Poor                             ADL either performed or assessed with clinical judgement   ADL Overall ADL's : Needs assistance/impaired Eating/Feeding: Set up;Sitting                       Toilet Transfer: Minimal assistance;Ambulation;Regular Toilet;Rolling walker (2 wheels) Toilet Transfer Details (indicate cue type and reason): simulated. pt is incontinen with bowels, continent bladder Toileting- Clothing Manipulation and Hygiene: Maximal assistance;Sit to/from stand Toileting - Clothing Manipulation Details (indicate cue type and reason): peri care standing at the sink     Functional mobility during ADLs: Minimal assistance;Rolling walker (2 wheels) General ADL Comments: cues needed for problem solving, limited activity tolerance    Extremity/Trunk Assessment Upper Extremity Assessment Upper Extremity Assessment: Left hand dominant;Generalized weakness   Lower Extremity Assessment Lower Extremity Assessment: Defer to PT evaluation        Vision       Perception Perception Perception: Not tested   Praxis Praxis  Praxis: Not tested    Cognition Arousal: Alert Behavior During Therapy: WFL for tasks assessed/performed Overall Cognitive Status: History of cognitive impairments - at baseline Area of Impairment: Orientation, Memory, Following commands,  Safety/judgement, Problem solving, Attention                 Orientation Level: Disoriented to, Place, Time, Situation Current Attention Level: Sustained Memory: Decreased short-term memory Following Commands: Follows one step commands with increased time, Follows one step commands inconsistently Safety/Judgement: Decreased awareness of deficits, Decreased awareness of safety   Problem Solving: Slow processing, Decreased initiation, Requires verbal cues, Requires tactile cues General Comments: required hand over hand intitiation at times.              General Comments VSS    Pertinent Vitals/ Pain       Pain Assessment Pain Assessment: Faces Faces Pain Scale: Hurts a little bit Pain Location: generalized Pain Descriptors / Indicators: Discomfort, Grimacing Pain Intervention(s): Limited activity within patient's tolerance, Monitored during session  Home Living Family/patient expects to be discharged to:: Private residence Living Arrangements: Alone Available Help at Discharge: Family;Available PRN/intermittently;Personal care attendant Type of Home: House Home Access: Level entry     Home Layout: One level                  Frequency  Min 1X/week        Progress Toward Goals  OT Goals(current goals can now be found in the care plan section)  Progress towards OT goals: Progressing toward goals  Acute Rehab OT Goals Patient Stated Goal: to go to rehab OT Goal Formulation: Patient unable to participate in goal setting Time For Goal Achievement: 03/13/23 Potential to Achieve Goals: Good ADL Goals Pt Will Perform Lower Body Bathing: with min assist;sit to/from stand Pt Will Perform Lower Body Dressing: with min assist;sit to/from stand Pt Will Transfer to Toilet: with contact guard assist;ambulating;regular height toilet;grab bars Pt Will Perform Toileting - Clothing Manipulation and hygiene: with min assist;sit to/from stand  Plan       Co-evaluation    PT/OT/SLP Co-Evaluation/Treatment: Yes Reason for Co-Treatment: For patient/therapist safety   OT goals addressed during session: ADL's and self-care      AM-PAC OT 6 Clicks Daily Activity     Outcome Measure   Help from another person eating meals?: A Little Help from another person taking care of personal grooming?: A Little Help from another person toileting, which includes using toliet, bedpan, or urinal?: A Lot Help from another person bathing (including washing, rinsing, drying)?: A Lot Help from another person to put on and taking off regular upper body clothing?: A Little Help from another person to put on and taking off regular lower body clothing?: A Lot 6 Click Score: 15    End of Session Equipment Utilized During Treatment: Gait belt;Rolling walker (2 wheels)  OT Visit Diagnosis: Unsteadiness on feet (R26.81);History of falling (Z91.81)   Activity Tolerance Patient tolerated treatment well   Patient Left in chair;with call bell/phone within reach;with chair alarm set   Nurse Communication Mobility status        Time: 8788-8762 OT Time Calculation (min): 26 min  Charges: OT General Charges $OT Visit: 1 Visit OT Treatments $Therapeutic Activity: 8-22 mins  Lucie Kendall, OTR/L Acute Rehabilitation Services Office [phone] Secure Chat Communication Preferred   Lucie JONETTA Kendall 03/05/2023, 2:24 PM

## 2023-03-05 NOTE — Progress Notes (Signed)
 IP rehab admissions - We have received a denial from insurance carrier for CIR.  Noted CM Tresa Endo is pursuing SNF placement.  Son is in agreement with this plan.  I will sign off for CIR at this time.  [phone]

## 2023-03-05 NOTE — Plan of Care (Signed)
  Problem: Coping: Goal: Will verbalize positive feelings about self Outcome: Progressing   Problem: Nutrition: Goal: Risk of aspiration will decrease Outcome: Progressing   Problem: Clinical Measurements: Goal: Ability to maintain clinical measurements within normal limits will improve Outcome: Progressing   Problem: Clinical Measurements: Goal: Will remain free from infection Outcome: Progressing   Problem: Clinical Measurements: Goal: Diagnostic test results will improve Outcome: Progressing   Problem: Clinical Measurements: Goal: Cardiovascular complication will be avoided Outcome: Progressing   Problem: Elimination: Goal: Will not experience complications related to bowel motility Outcome: Progressing   Problem: Safety: Goal: Ability to remain free from injury will improve Outcome: Progressing   Problem: Skin Integrity: Goal: Risk for impaired skin integrity will decrease Outcome: Progressing   Problem: Pain Management: Goal: General experience of comfort will improve Outcome: Progressing

## 2023-03-05 NOTE — Plan of Care (Signed)
 Sitting up in the chair with family at her side.  Walked in the hallway with therapy and even walked to the bathroom a couple times.  No complaints.   Problem: Nutrition: Goal: Risk of aspiration will decrease Outcome: Progressing   Problem: Nutrition: Goal: Dietary intake will improve Outcome: Progressing   Problem: Activity: Goal: Risk for activity intolerance will decrease Outcome: Progressing   Problem: Nutrition: Goal: Adequate nutrition will be maintained Outcome: Progressing   Problem: Coping: Goal: Level of anxiety will decrease Outcome: Progressing

## 2023-03-05 NOTE — Progress Notes (Signed)
 PROGRESS NOTE Christine Henderson  FMW:[id number] DOB: Dec 10, 1930 DOA: 02/23/2023 PCP: Domenica Harlene LABOR, MD  Brief Narrative/Hospital Course: Christine Henderson is a 89 y.o. female with a history of primary hypertension, peripheral neuropathy, hyperlipidemia, dementia.  Patient presented secondary to being found down and found to have evidence of an intracerebral hemorrhage with incidental finding of an acute ischemic stroke. Patient admitted by neurology to ICU. Neurosurgery consulted with recommendations for no neurosurgical intervention. Patient managed off of antithrombotic therapy . Neurology has seen the patient.  Patient remains weak and deconditioned and planning for CIR Awaiting for CIR approval and remains medically stable  03/05/2023: Patient seen alongside patient's daughter-in-law.  No new changes.  Likely discharge to skilled nursing facility tomorrow.   Subjective: Patient seen and examined  No significant history from patient. No new changes.  Assessment and Plan: Principal Problem:   Hemorrhagic stroke [facility]) Active Problems:   Hyperlipidemia   Mild dementia   Cerebral amyloid angiopathy (HCC)   Pressure injury of skin   Multifocal ICH likely traumatic versus CAA related Acute ischemic stroke incidental finding on left frontal white matter: completed workup for ICH and stroke with MRI, MRA, carotid Doppler, LDL 89 A1c 6.4 UDS negative -see report in chart for detail.No antithrombotic due to ICH. Cont on PT OT>awiting CIR 03/05/2023: Pursue disposition.  Patient be discharged to skilled nursing facility tomorrow  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 In differential diagnosis per neurology.Recommendation for no antithrombotics.   Frequent PACs: asymptomatic.Monitor   Primary hypertension:  BP well-controlled after increasing losartan  25mg > 50 mg 03/02/23 for Goal SBP < 140 mmHg   Diastolic heart dysfunction G1 DD on echo: Asymptomatic-and euvolemic.  HLD: continue simvastatin  and omega-3 fatty  acid outpatient   Dementia -No behavioral problems.    Goals of care: Patient's son has discussed with patient family and has renewed her living will and requesting DNR 12/29  Right lateral stage I pressure ulcer POA on hip , continue wound care Pressure Injury 02/23/23 Hip Right;Lateral Stage 1 -  Intact skin with non-blanchable redness of a localized area usually over a bony prominence. Redness/Irritation upon Initial Head-to-Toe Assessment (Active)  02/23/23 2330  Location: Hip  Location Orientation: Right;Lateral  Staging: Stage 1 -  Intact skin with non-blanchable redness of a localized area usually over a bony prominence.  Wound Description (Comments): Redness/Irritation upon Initial Head-to-Toe Assessment  Present on Admission: Yes   DVT prophylaxis: heparin  injection 5,000 Units Start: 02/25/23 1115 SCD's Start: 02/23/23 2251 Code Status:   Code Status: Limited: Do not attempt resuscitation (DNR) -DNR-LIMITED -Do Not Intubate/DNI  Family Communication: plan of care discussed with patient/son was updated at the bedside 12/31, no family today  Patient status is: Remains hospitalized because of severity of illness Level of care: Telemetry Medical   Dispo: The patient is from: Home by herself with aids daily.            Anticipated disposition: CIR once available.  She is medically stable  Objective: Vitals last 24 hrs: Vitals:   03/05/23 0337 03/05/23 0729 03/05/23 1147 03/05/23 1603  BP: 130/82 (!) 130/94 125/77 131/78  Pulse: 83 65 86 85  Resp: 16     Temp: 98.8 F (37.1 C) 98.4 F (36.9 C) 98.6 F (37 C) 98.4 F (36.9 C)  TempSrc:  Oral Oral Oral  SpO2: 97% 98% 97% 100%  Weight:      Height:       Weight change:   Physical Examination: General exam: Patient is awake  and alert.  Not in any distress.  HEENT: No pallor or jaundice.   Respiratory system: Clear to auscultation. Cardiovascular system: S1 & S2  Gastrointestinal system: Abdomen is soft and  nontender. Nervous System: Awake and alert.  Moves all extremities.  Medications reviewed:  Scheduled Meds:  donepezil   10 mg Oral QHS   heparin  injection (subcutaneous)  5,000 Units Subcutaneous Q12H   losartan   50 mg Oral Daily   pantoprazole   40 mg Oral QHS   senna-docusate  1 tablet Oral BID   simvastatin   10 mg Oral QHS   Continuous Infusions:  Diet Order             Diet regular Room service appropriate? Yes with Assist; Fluid consistency: Thin  Diet effective now                  Intake/Output Summary (Last 24 hours) at 03/05/2023 1820 Last data filed at 03/05/2023 1200 Gross per 24 hour  Intake 240 ml  Output 900 ml  Net -660 ml   Net IO Since Admission: 1,120.49 mL [03/05/23 1820]  Wt Readings from Last 3 Encounters:  03/02/23 54.3 kg  11/28/22 58.5 kg  11/21/22 55.3 kg   Unresulted Labs (From admission, onward)    None     Data Reviewed: I have personally reviewed following labs and imaging studies Recent Labs  Lab 02/27/23 0624 02/28/23 0443 03/01/23 0503  WBC 10.4 9.4 9.4  HGB 13.1 12.9 12.4  HCT 39.2 38.1 36.6  MCV 94.5 93.8 93.6  PLT 282 289 276   Basic Metabolic Panel:  Recent Labs  Lab 02/27/23 0624 02/28/23 0443 03/01/23 0503 03/04/23 0659  NA 136 135 134*  --   K 3.5 4.3 3.7  --   CL 104 104 104  --   CO2 22 23 23   --   GLUCOSE 123* 122* 123*  --   BUN 16 19 18   --   CREATININE 0.90 0.96 0.86  --   CALCIUM  8.7* 8.8* 8.6*  --   MG  --   --   --  1.8   GFR: Estimated Creatinine Clearance: 34.4 mL/min (by C-G formula based on SCr of 0.86 mg/dL). Liver Function Tests:  No results for input(s): AST, ALT, ALKPHOS, BILITOT, PROT, ALBUMIN in the last 168 hours. No results for input(s): PROCALCITON, LATICACIDVEN in the last 168 hours. Recent Results (from the past 240 hours)  MRSA Next Gen by PCR, Nasal     Status: None   Collection Time: 02/23/23 11:37 PM   Specimen: Nasal Mucosa; Nasal Swab  Result Value Ref Range  Status   MRSA by PCR Next Gen NOT DETECTED NOT DETECTED Final    Comment: (NOTE) The GeneXpert MRSA Assay (FDA approved for NASAL specimens only), is one component of a comprehensive MRSA colonization surveillance program. It is not intended to diagnose MRSA infection nor to guide or monitor treatment for MRSA infections. Test performance is not FDA approved in patients less than 68 years old. Performed at [facility], [address], [place], [place] [address]   Antimicrobials/Microbiology: Anti-infectives (From admission, onward)    None         Component Value Date/Time   SDES  11/19/2022 1803    BLOOD RIGHT ANTECUBITAL Performed at [facility], [address] [place], [place] [address]    [facility]  11/19/2022 1803    BOTTLES DRAWN AEROBIC AND ANAEROBIC Blood Culture adequate volume Performed at  [facility], [address] [place], [place] [address]    CULT  11/19/2022 1803    NO GROWTH 5 DAYS Performed at [facility], [address] [place], [place] [address]    REPTSTATUS 11/24/2022 FINAL 11/19/2022 1803   Radiology Studies: No results found.   LOS: 10 days   Time spent: 35 minutes.  Leatrice LILLETTE Chapel, MD [facility]  03/05/2023, 6:20 PM

## 2023-03-05 NOTE — Progress Notes (Signed)
 Physical Therapy Treatment Patient Details Name: Christine Henderson MRN: [id number] DOB: 09-03-1930 Today's Date: 03/05/2023   History of Present Illness 89 yo female presents to [facility] on 12/23 with fall at home. CTH reveals multifocal L hemisphere acute hemorrhagic contusions, largest is 3.5 cm L temporal with small amount of overlying subarachnoid blood and IVH. MRI punctate acute ischemic stroke in left frontal white matter.  PMH includes HLD, HTN, peripheral neuropathy, osteopenia, recurrent falls, disequilibrium, IBS, mild dementia.    PT Comments  Pt supine in bed on arrival.  Presents with bowel incontinence.  Pt wore depends during session.  She was more alert this session and able to progress to gait training.  Performed all mobility with forward flexion.  Continue to recommend aggressive rehab in a post acute setting.  Pt was ambulatory at baseline.     If plan is discharge home, recommend the following:     Can travel by private vehicle     Yes  Equipment Recommendations       Recommendations for Other Services       Precautions / Restrictions Precautions Precautions: Fall Precaution Comments: SBP< 160 Restrictions Weight Bearing Restrictions Per Provider Order: No     Mobility  Bed Mobility Overal bed mobility: Needs Assistance Bed Mobility: Supine to Sit, Sit to Supine     Supine to sit: Min assist, +2 for physical assistance     General bed mobility comments: Increased time and effort with cues for sequencing of BLEs and hand placement.  Cues for upper trunk control, pt presents with forward flexion of trunk and neck.  Increased time to scoot forward.    Transfers Overall transfer level: Needs assistance Equipment used: Rolling walker (2 wheels) Transfers: Sit to/from Stand Sit to Stand: Mod assist, +2 safety/equipment           General transfer comment: Mod assistance to move to into standing.  Cues for hand placement to and from seated surface.     Ambulation/Gait Ambulation/Gait assistance: Mod assist, +2 safety/equipment Gait Distance (Feet): 40 Feet (+ 30 ft) Assistive device: Rolling walker (2 wheels) Gait Pattern/deviations: Step-through pattern, Decreased stride length, Trunk flexed, Narrow base of support       General Gait Details: Cues for scap retraction, forward gaze, upper trunk control, RW placement and position.  She drifts to the R.  Close chair follow for rest periods.   Stairs             Wheelchair Mobility     Tilt Bed    Modified Rankin (Stroke Patients Only) Modified Rankin (Stroke Patients Only) Pre-Morbid Rankin Score: Moderate disability Modified Rankin: Severe disability     Balance Overall balance assessment: Needs assistance, History of Falls Sitting-balance support: No upper extremity supported, Bilateral upper extremity supported, Feet supported Sitting balance-Leahy Scale: Poor Sitting balance - Comments: EOB after decr responsiveness up to mod assist     Standing balance-Leahy Scale: Poor                              Cognition Arousal: Alert Behavior During Therapy: WFL for tasks assessed/performed Overall Cognitive Status: History of cognitive impairments - at baseline Area of Impairment: Orientation, Memory, Following commands, Safety/judgement, Problem solving, Attention                 Orientation Level: Disoriented to, Place, Time, Situation Current Attention Level: Sustained Memory: Decreased short-term memory Following Commands: Follows one step  commands with increased time, Follows one step commands inconsistently Safety/Judgement: Decreased awareness of deficits, Decreased awareness of safety   Problem Solving: Slow processing, Decreased initiation, Requires verbal cues, Requires tactile cues General Comments: required hand over hand intitiation at times.        Exercises      General Comments        Pertinent Vitals/Pain Pain  Assessment Pain Assessment: Faces Faces Pain Scale: Hurts a little bit Pain Location: generalized Pain Descriptors / Indicators: Discomfort, Grimacing Pain Intervention(s): Repositioned    Home Living                          Prior Function            PT Goals (current goals can now be found in the care plan section) Acute Rehab PT Goals Patient Stated Goal: family agreeable to rehab Potential to Achieve Goals: Fair Progress towards PT goals: Progressing toward goals    Frequency    Min 1X/week      PT Plan      Co-evaluation PT/OT/SLP Co-Evaluation/Treatment: Yes Reason for Co-Treatment: For patient/therapist safety (had syncopal episode last session.)          AM-PAC PT 6 Clicks Mobility   Outcome Measure  Help needed turning from your back to your side while in a flat bed without using bedrails?: A Little Help needed moving from lying on your back to sitting on the side of a flat bed without using bedrails?: A Lot Help needed moving to and from a bed to a chair (including a wheelchair)?: A Lot Help needed standing up from a chair using your arms (e.g., wheelchair or bedside chair)?: A Lot Help needed to walk in hospital room?: A Lot Help needed climbing 3-5 steps with a railing? : Total 6 Click Score: 12    End of Session Equipment Utilized During Treatment: Gait belt Activity Tolerance: Patient tolerated treatment well Patient left: in chair;with call bell/phone within reach Nurse Communication: Mobility status PT Visit Diagnosis: Other abnormalities of gait and mobility (R26.89);Muscle weakness (generalized) (M62.81);Other symptoms and signs involving the nervous system (R29.898)     Time: 8788-8762 PT Time Calculation (min) (ACUTE ONLY): 26 min  Charges:    $Gait Training: 8-22 mins PT General Charges $$ ACUTE PT VISIT: 1 Visit                     Toya HAMS , PTA Acute Rehabilitation Services Office [phone]    Toya JINNY Gosling 03/05/2023, 12:53 PM

## 2023-03-05 NOTE — TOC Progression Note (Addendum)
 Transition of Care [facility]) - Progression Note    Patient Details  Name: Christine Henderson MRN: [id number] Date of Birth: [birth year]  Transition of Care [facility]) CM/SW Contact  Andrez JULIANNA George, RN Phone Number: 03/05/2023, 1:36 PM  Clinical Narrative:     Pt was denied for CIR per [facility]. They are approving SNF rehab.  Sons choices: [facility], [facility], [facility], [facility] CM has left voicemail for [employer]  Son updated.   1418: Auth ID: [id number] She will d/c to [facility] tomorrow.   Expected Discharge Plan: IP Rehab Facility Barriers to Discharge: Continued Medical Work up, Conservator, Museum/gallery and Services In-house Referral: Clinical Social Work   Post Acute Care Choice: IP Rehab Living arrangements for the past 2 months: Single Family Home                                       Social Determinants of Health (SDOH) Interventions SDOH Screenings   Food Insecurity: Patient Unable To Answer (02/24/2023)  Housing: Patient Unable To Answer (02/24/2023)  Transportation Needs: Patient Unable To Answer (02/24/2023)  Utilities: Patient Unable To Answer (02/24/2023)  Alcohol Screen: Low Risk  (10/11/2020)  Depression (PHQ2-9): Low Risk  (01/28/2022)  Financial Resource Strain: Low Risk  (10/11/2020)  Physical Activity: Insufficiently Active (10/11/2020)  Social Connections: Patient Unable To Answer (03/03/2023)  Stress: No Stress Concern Present (10/11/2020)  Tobacco Use: Low Risk  (02/05/2023)    Readmission Risk Interventions     No data to display

## 2023-03-06 ENCOUNTER — Inpatient Hospital Stay (HOSPITAL_COMMUNITY): Payer: Medicare Other

## 2023-03-06 DIAGNOSIS — M542 Cervicalgia: Secondary | ICD-10-CM | POA: Diagnosis not present

## 2023-03-06 DIAGNOSIS — R5381 Other malaise: Secondary | ICD-10-CM | POA: Diagnosis not present

## 2023-03-06 DIAGNOSIS — K219 Gastro-esophageal reflux disease without esophagitis: Secondary | ICD-10-CM | POA: Diagnosis not present

## 2023-03-06 DIAGNOSIS — S0990XA Unspecified injury of head, initial encounter: Secondary | ICD-10-CM | POA: Diagnosis not present

## 2023-03-06 DIAGNOSIS — E559 Vitamin D deficiency, unspecified: Secondary | ICD-10-CM | POA: Diagnosis not present

## 2023-03-06 DIAGNOSIS — I69198 Other sequelae of nontraumatic intracerebral hemorrhage: Secondary | ICD-10-CM | POA: Diagnosis not present

## 2023-03-06 DIAGNOSIS — R4701 Aphasia: Secondary | ICD-10-CM | POA: Diagnosis not present

## 2023-03-06 DIAGNOSIS — G629 Polyneuropathy, unspecified: Secondary | ICD-10-CM | POA: Diagnosis not present

## 2023-03-06 DIAGNOSIS — W19XXXA Unspecified fall, initial encounter: Secondary | ICD-10-CM | POA: Diagnosis not present

## 2023-03-06 DIAGNOSIS — E785 Hyperlipidemia, unspecified: Secondary | ICD-10-CM | POA: Diagnosis not present

## 2023-03-06 DIAGNOSIS — I639 Cerebral infarction, unspecified: Secondary | ICD-10-CM | POA: Diagnosis not present

## 2023-03-06 DIAGNOSIS — R531 Weakness: Secondary | ICD-10-CM | POA: Diagnosis not present

## 2023-03-06 DIAGNOSIS — I10 Essential (primary) hypertension: Secondary | ICD-10-CM | POA: Diagnosis not present

## 2023-03-06 DIAGNOSIS — I619 Nontraumatic intracerebral hemorrhage, unspecified: Secondary | ICD-10-CM | POA: Diagnosis not present

## 2023-03-06 DIAGNOSIS — K589 Irritable bowel syndrome without diarrhea: Secondary | ICD-10-CM | POA: Diagnosis not present

## 2023-03-06 DIAGNOSIS — M25552 Pain in left hip: Secondary | ICD-10-CM | POA: Diagnosis not present

## 2023-03-06 DIAGNOSIS — R109 Unspecified abdominal pain: Secondary | ICD-10-CM | POA: Diagnosis not present

## 2023-03-06 DIAGNOSIS — I69128 Other speech and language deficits following nontraumatic intracerebral hemorrhage: Secondary | ICD-10-CM | POA: Diagnosis not present

## 2023-03-06 DIAGNOSIS — R1312 Dysphagia, oropharyngeal phase: Secondary | ICD-10-CM | POA: Diagnosis not present

## 2023-03-06 DIAGNOSIS — R296 Repeated falls: Secondary | ICD-10-CM | POA: Diagnosis not present

## 2023-03-06 DIAGNOSIS — Z7401 Bed confinement status: Secondary | ICD-10-CM | POA: Diagnosis not present

## 2023-03-06 DIAGNOSIS — E782 Mixed hyperlipidemia: Secondary | ICD-10-CM | POA: Diagnosis not present

## 2023-03-06 DIAGNOSIS — S0181XA Laceration without foreign body of other part of head, initial encounter: Secondary | ICD-10-CM | POA: Diagnosis not present

## 2023-03-06 DIAGNOSIS — K5900 Constipation, unspecified: Secondary | ICD-10-CM | POA: Diagnosis not present

## 2023-03-06 DIAGNOSIS — G894 Chronic pain syndrome: Secondary | ICD-10-CM | POA: Diagnosis not present

## 2023-03-06 DIAGNOSIS — Z743 Need for continuous supervision: Secondary | ICD-10-CM | POA: Diagnosis not present

## 2023-03-06 DIAGNOSIS — R410 Disorientation, unspecified: Secondary | ICD-10-CM | POA: Diagnosis not present

## 2023-03-06 LAB — LACTIC ACID, PLASMA: Lactic Acid, Venous: 1.4 mmol/L (ref 0.5–1.9)

## 2023-03-06 LAB — URINALYSIS, ROUTINE W REFLEX MICROSCOPIC
Bilirubin Urine: NEGATIVE
Glucose, UA: 50 mg/dL — AB
Hgb urine dipstick: NEGATIVE
Ketones, ur: NEGATIVE mg/dL
Leukocytes,Ua: NEGATIVE
Nitrite: NEGATIVE
Protein, ur: NEGATIVE mg/dL
Specific Gravity, Urine: 1.023 (ref 1.005–1.030)
pH: 5 (ref 5.0–8.0)

## 2023-03-06 LAB — CBC WITH DIFFERENTIAL/PLATELET
Abs Immature Granulocytes: 0.08 10*3/uL — ABNORMAL HIGH (ref 0.00–0.07)
Basophils Absolute: 0 10*3/uL (ref 0.0–0.1)
Basophils Relative: 0 %
Eosinophils Absolute: 0.1 10*3/uL (ref 0.0–0.5)
Eosinophils Relative: 1 %
HCT: 38.9 % (ref 36.0–46.0)
Hemoglobin: 13.1 g/dL (ref 12.0–15.0)
Immature Granulocytes: 1 %
Lymphocytes Relative: 11 %
Lymphs Abs: 1.2 10*3/uL (ref 0.7–4.0)
MCH: 31.3 pg (ref 26.0–34.0)
MCHC: 33.7 g/dL (ref 30.0–36.0)
MCV: 93.1 fL (ref 80.0–100.0)
Monocytes Absolute: 0.9 10*3/uL (ref 0.1–1.0)
Monocytes Relative: 9 %
Neutro Abs: 8.5 10*3/uL — ABNORMAL HIGH (ref 1.7–7.7)
Neutrophils Relative %: 78 %
Platelets: 440 10*3/uL — ABNORMAL HIGH (ref 150–400)
RBC: 4.18 MIL/uL (ref 3.87–5.11)
RDW: 14.5 % (ref 11.5–15.5)
WBC: 10.8 10*3/uL — ABNORMAL HIGH (ref 4.0–10.5)
nRBC: 0 % (ref 0.0–0.2)

## 2023-03-06 LAB — RENAL FUNCTION PANEL
Albumin: 2.6 g/dL — ABNORMAL LOW (ref 3.5–5.0)
Anion gap: 9 (ref 5–15)
BUN: 25 mg/dL — ABNORMAL HIGH (ref 8–23)
CO2: 24 mmol/L (ref 22–32)
Calcium: 9.1 mg/dL (ref 8.9–10.3)
Chloride: 101 mmol/L (ref 98–111)
Creatinine, Ser: 0.94 mg/dL (ref 0.44–1.00)
GFR, Estimated: 58 mL/min — ABNORMAL LOW (ref >60)
Glucose, Bld: 132 mg/dL — ABNORMAL HIGH (ref 70–99)
Phosphorus: 3.3 mg/dL (ref 2.5–4.6)
Potassium: 4.1 mmol/L (ref 3.5–5.1)
Sodium: 134 mmol/L — ABNORMAL LOW (ref 135–145)

## 2023-03-06 MED ORDER — CALCIUM-VITAMIN D-MINERALS 600-400 MG-UNIT PO CHEW: 1.0000 | CHEWABLE_TABLET | Freq: Every day | ORAL | 0 refills | Status: DC

## 2023-03-06 MED ORDER — SUCRALFATE 1 GM/10ML PO SUSP
1.0000 g | Freq: Three times a day (TID) | ORAL | Status: DC
  Administered 2023-03-06: 1 g via ORAL
  Filled 2023-03-06 (×3): qty 10

## 2023-03-06 MED ORDER — HYDROCORTISONE (PERIANAL) 2.5 % EX CREA: TOPICAL_CREAM | Freq: Two times a day (BID) | CUTANEOUS | 0 refills | Status: DC | PRN

## 2023-03-06 MED ORDER — PANTOPRAZOLE SODIUM 40 MG PO TBEC
40.0000 mg | DELAYED_RELEASE_TABLET | Freq: Once | ORAL | Status: AC
  Administered 2023-03-06: 40 mg via ORAL
  Filled 2023-03-06: qty 1

## 2023-03-06 MED ORDER — PANTOPRAZOLE SODIUM 40 MG PO TBEC: 40.0000 mg | DELAYED_RELEASE_TABLET | Freq: Every day | ORAL | 0 refills | Status: DC

## 2023-03-06 MED ORDER — SENNOSIDES-DOCUSATE SODIUM 8.6-50 MG PO TABS: 1.0000 | ORAL_TABLET | Freq: Two times a day (BID) | ORAL | 0 refills | Status: DC

## 2023-03-06 MED ORDER — LOSARTAN POTASSIUM 50 MG PO TABS: 50.0000 mg | ORAL_TABLET | Freq: Every day | ORAL | 1 refills | Status: DC

## 2023-03-06 MED ORDER — ACETAMINOPHEN 325 MG PO TABS: 650.0000 mg | ORAL_TABLET | ORAL | Status: DC | PRN

## 2023-03-06 MED ORDER — ORAL CARE MOUTH RINSE: 15.0000 mL | OROMUCOSAL | Status: DC | PRN

## 2023-03-06 NOTE — TOC Transition Note (Signed)
 Transition of Care [facility]) - Discharge Note   Patient Details  Name: Christine Henderson MRN: [id number] Date of Birth: February 03, 1931  Transition of Care [facility]) CM/SW Contact:  Andrez JULIANNA George, RN Phone Number: 03/06/2023, 12:18 PM   Clinical Narrative:     Pt is discharging to [facility] and rehab today. She will transport via [facility]. Son is at the beside and aware of plan.   Number for report: [phone]  Final next level of care: Skilled Nursing Facility Barriers to Discharge: No Barriers Identified   Patient Goals and CMS Choice Patient states their goals for this hospitalization and ongoing recovery are:: CIR CMS Medicare.gov Compare Post Acute Care list provided to:: Patient Represenative (must comment) Choice offered to / list presented to : Adult Children [place] ownership interest in [facility].provided to:: Adult Children    Discharge Placement              Patient chooses bed at:  ([facility]) Patient to be transferred to facility by: [facility] Name of family member notified: son at the bedside Patient and family notified of of transfer: 03/06/23  Discharge Plan and Services Additional resources added to the After Visit Summary for   In-house Referral: Clinical Social Work   Post Acute Care Choice: IP Rehab                               Social Drivers of Health (SDOH) Interventions SDOH Screenings   Food Insecurity: Patient Unable To Answer (02/24/2023)  Housing: Patient Unable To Answer (02/24/2023)  Transportation Needs: Patient Unable To Answer (02/24/2023)  Utilities: Patient Unable To Answer (02/24/2023)  Alcohol Screen: Low Risk  (10/11/2020)  Depression (PHQ2-9): Low Risk  (01/28/2022)  Financial Resource Strain: Low Risk  (10/11/2020)  Physical Activity: Insufficiently Active (10/11/2020)  Social Connections: Patient Unable To Answer (03/03/2023)  Stress: No Stress Concern Present (10/11/2020)  Tobacco Use: Low Risk  (02/05/2023)      Readmission Risk Interventions     No data to display

## 2023-03-06 NOTE — Progress Notes (Signed)
 Attempted to call report to [facility] and rehab @ [phone] was unable to get in touch with nurse receiving patient will attempt to call back.

## 2023-03-06 NOTE — Progress Notes (Signed)
 Patient noted to be in pain upon assessment. Patient tearful abdominal distended and tight patient states she hurts all over. Writer gave tylenol per order and notified Dr. Dartha Lodge

## 2023-03-06 NOTE — Discharge Summary (Signed)
 Physician Discharge Summary  Patient ID: Christine Henderson MRN: [id number] DOB/AGE: Mar 31, 1930 89 y.o.  Admit date: 02/23/2023 Discharge date: 03/06/2023  Admission Diagnoses:  Discharge Diagnoses:  Principal Problem:   Hemorrhagic stroke [facility]) Active Problems:   Hyperlipidemia   Mild dementia   Cerebral amyloid angiopathy (HCC)   Pressure injury of skin   Discharged Condition: stable  Hospital Course: Patient is a 89 year old Caucasian female with past medical history significant for hypertension, peripheral neuropathy, hyperlipidemia, dementia.  Patient was found down.  Patient was found to have evidence of an intracerebral hemorrhage with incidental finding of an acute ischemic stroke. Patient was admitted by neurology to ICU. Neurosurgery was consulted, but no neurosurgical intervention was recommended.  Patient was managed off of antithrombotic therapy.  Neurology team directed patient's care.  Patient remains weak and deconditioned, will be discharged to the subacute rehabilitation unit (SNF).    Multifocal ICH likely traumatic versus CAA related Acute ischemic stroke incidental finding on left frontal white matter: -Completed workup for ICH and stroke with MRI, MRA, carotid Doppler, LDL 89 A1c 6.4 UDS negative -see report in chart for detail.No antithrombotic due to ICH. Cont on PT OT>awiting CIR -Patient be discharged to subacute rehabilitation facility (SNF)   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 In differential diagnosis per neurology.Recommendation for no antithrombotics.   Frequent PACs: asymptomatic.Monitor   Primary hypertension: -Patient will be discharged on losartan  50 Mg p.o. once daily. -Monitor blood pressure closely.    Diastolic heart dysfunction G1 DD on echo: Asymptomatic-and euvolemic.   HLD: continue simvastatin  and omega-3 fatty acid outpatient   Dementia -No behavioral problems.     Goals of care: Patient's son has discussed with patient family and has renewed her  living will and requesting DNR 12/29   Right lateral stage I pressure ulcer POA on hip , continue wound care Pressure Injury 02/23/23 Hip Right;Lateral Stage 1 -  Intact skin with non-blanchable redness of a localized area usually over a bony prominence. Redness/Irritation upon Initial Head-to-Toe Assessment (Active)  02/23/23 2330  Location: Hip  Location Orientation: Right;Lateral  Staging: Stage 1 -  Intact skin with non-blanchable redness of a localized area usually over a bony prominence.  Wound Description (Comments): Redness/Irritation upon Initial Head-to-Toe Assessment  Present on Admission: Yes     Consults: neurology and neurosurgery  Significant Diagnostic Studies:  MRI HEAD WITHOUT AND WITH CONTRAST   MRA HEAD WITHOUT CONTRAST   TECHNIQUE: Multiplanar, multi-echo pulse sequences of the brain and surrounding structures were acquired without and with intravenous contrast. Angiographic images of the Circle of Willis were acquired using MRA technique without intravenous contrast.   CONTRAST:  5mL GADAVIST  GADOBUTROL  1 MMOL/ML IV SOLN   COMPARISON:  CT studies earlier same day.   FINDINGS: MRI HEAD FINDINGS   Brain: Punctate focus of restricted diffusion in the left frontal white matter likely to represent an incidental acute small vessel infarction.   No focal abnormality affects the brainstem or cerebellum. There is a background pattern of advanced chronic small-vessel ischemic change of the white matter, with innumerable punctate foci of hemosiderin deposition which could be due to chronic hypertensive small-vessel disease or amyloid angiopathy. Post traumatic intraparenchymal hematomas remain visible in the left temporal and frontal region with mild surrounding edema. Old hemorrhagic stroke seen in the medial right parietooccipital junction region. Small amount of blood layering dependently within the occipital horns of the lateral ventricles. No significant  volume of subarachnoid blood is suggested by MRI. Ventricular size remains stable. No evidence  of mass lesion. Very tiny amount of subdural blood along the lateral convexity on the left, 1 mm in thickness. No midline shift.   Vascular: Major vessels at the base of the brain show flow.   Skull and upper cervical spine: Negative   Sinuses/Orbits: Clear/normal   Other: None   MRA HEAD FINDINGS   Anterior circulation: Both internal carotid arteries are patent through the skull base and siphon regions. The anterior and middle cerebral vessels are patent. No large vessel occlusion or proximal stenosis. Distal vessels show atherosclerotic narrowing and irregularity.   Posterior circulation: Both vertebral arteries patent to the basilar artery. No basilar stenosis. Posterior circulation branch vessels show flow. Some atherosclerotic irregularity of the more distal PCA branch vessels.   Anatomic variants: None significant.   MRA HEAD FINDINGS   IMPRESSION: 1. Punctate focus of restricted diffusion in the left frontal white matter likely to represent an incidental acute small vessel infarction. 2. Post traumatic intraparenchymal hematomas remain visible in the left temporal and frontal region with mild surrounding edema. Small amount of blood layering dependently within the occipital horns of the lateral ventricles. No significant volume of subarachnoid blood is suggested by MRI. Ventricular size remains stable. 3. Very tiny amount of subdural blood along the lateral convexity on the left, 1 mm in thickness. 4. Old hemorrhagic stroke in the medial right parietooccipital junction region. 5. Background pattern of advanced chronic small-vessel ischemic change of the white matter, with innumerable punctate foci of hemosiderin deposition which could be due to chronic hypertensive small-vessel disease or amyloid angiopathy. 6. No large vessel occlusion or proximal stenosis. Distal  vessels show atherosclerotic narrowing and irregularity.     Electronically Signed   By: Oneil Officer M.D.   On: 02/24/2023 11:42   CT HEAD WITHOUT CONTRAST   TECHNIQUE: Contiguous axial images were obtained from the base of the skull through the vertex without intravenous contrast.   RADIATION DOSE REDUCTION: This exam was performed according to the departmental dose-optimization program which includes automated exposure control, adjustment of the mA and/or kV according to patient size and/or use of iterative reconstruction technique.   COMPARISON:  11/19/2022   FINDINGS: Brain: There is a left temporal intracerebral hemorrhage measuring 3.3 x 3.0 cm. Small amount of overlying subarachnoid blood. No mass effect or midline shift. No hydrocephalus.   Vascular: No hyperdense vessel or unexpected calcification.   Skull: No acute calvarial abnormality.   Sinuses/Orbits: No acute findings   Other: None   IMPRESSION: 3.3 cm left temporal intracerebral hemorrhage with small amount of overlying subarachnoid blood. No mass effect or midline shift.   Critical Value/emergent results were called by telephone at the time of interpretation on 02/23/2023 at 10:05 pm to provider [facility] , who verbally acknowledged these results.   Electronically Signed: By: Franky Crease M.D. On: 02/23/2023 22:07   CT CHEST, ABDOMEN, AND PELVIS WITH CONTRAST   TECHNIQUE: Multidetector CT imaging of the chest, abdomen and pelvis was performed following the standard protocol during bolus administration of intravenous contrast.   RADIATION DOSE REDUCTION: This exam was performed according to the departmental dose-optimization program which includes automated exposure control, adjustment of the mA and/or kV according to patient size and/or use of iterative reconstruction technique.   CONTRAST:  75mL OMNIPAQUE  IOHEXOL  350 MG/ML SOLN   COMPARISON:  05/29/2022   FINDINGS: CT CHEST  FINDINGS   Cardiovascular: Heart normal size. Ascending thoracic aorta upper limits normal in diameter at 3.9 cm. Aortic atherosclerosis.  Mediastinum/Nodes: No mediastinal, hilar, or axillary adenopathy. Trachea and esophagus are unremarkable. Thyroid  unremarkable.   Lungs/Pleura: Linear scarring in the lung bases. No acute confluent opacities, effusions or pneumothorax.   Musculoskeletal: Severe pectus deformity of the sternum. No acute bony abnormality. Chest wall soft tissues are unremarkable.   CT ABDOMEN PELVIS FINDINGS   Hepatobiliary: No hepatic injury or perihepatic hematoma. Gallbladder is unremarkable.   Pancreas: No focal abnormality or ductal dilatation.   Spleen: No splenic injury or perisplenic hematoma.   Adrenals/Urinary Tract: No adrenal hemorrhage or renal injury identified. Bladder is unremarkable.   Stomach/Bowel: Stomach, large and small bowel grossly unremarkable.   Vascular/Lymphatic: Aortoiliac atherosclerosis. No evidence of aneurysm or adenopathy.   Reproductive: Uterus and adnexa unremarkable.  No mass.   Other: No free fluid or free air.   Musculoskeletal: No acute bony abnormality. Convex leftward scoliosis in the lumbar spine. Degenerative disc and facet disease. No acute bony abnormality.   IMPRESSION: 1. No significant traumatic injury or acute findings in the chest, abdomen or pelvis. 2. Aortic atherosclerosis.     Electronically Signed   By: Franky Crease M.D.   On: 02/23/2023 21:27     CT CERVICAL SPINE WITHOUT CONTRAST   TECHNIQUE: Multidetector CT imaging of the cervical spine was performed without intravenous contrast. Multiplanar CT image reconstructions were also generated.   RADIATION DOSE REDUCTION: This exam was performed according to the departmental dose-optimization program which includes automated exposure control, adjustment of the mA and/or kV according to patient size and/or use of iterative reconstruction  technique.   COMPARISON:  None Available.   FINDINGS: Alignment: Suboptimal study due to patient motion.  No subluxation.   Skull base and vertebrae: No visible fracture. Study limited due to motion.   Soft tissues and spinal canal: No prevertebral fluid or swelling. No visible canal hematoma.   Disc levels:  Advanced diffuse degenerative disc and facet disease.   Upper chest: No acute findings   Other: Left temporal lobe intraparenchymal hemorrhage partially visualized. See head CT for further discussion.   IMPRESSION: 1. Motion limited study. No visible fracture or subluxation. 2. Advanced diffuse degenerative disc and facet disease. 3. Left temporal lobe intraparenchymal hemorrhage. See head CT for further discussion.     Electronically Signed   By: Franky Crease M.D.   On: 02/23/2023 22:09    Discharge Exam: Blood pressure (!) 159/92, pulse 78, temperature 98.2 F (36.8 C), temperature source Oral, resp. rate (!) 22, height 5' 2 (1.575 m), weight 54.3 kg, SpO2 100%.   Disposition: Discharge disposition: 03-Skilled Nursing Facility       Discharge Instructions     Ambulatory referral to Neurology   Complete by: As directed    Follow up with Camie NP at [facility] in 4 weeks. Pt is Sara's pt. Thanks.   Diet - low sodium heart healthy   Complete by: As directed    Discharge wound care:   Complete by: As directed    Continue current wound care regimen.   Increase activity slowly   Complete by: As directed       Allergies as of 03/06/2023       Reactions   Codeine Nausea And Vomiting        Medication List     STOP taking these medications    CALCIUM  PLUS VITAMIN D  PO   fish oil-omega-3 fatty acids 1000 MG capsule   Glucosamine 1500 Complex Caps       TAKE these medications  acetaminophen  325 MG tablet Commonly known as: TYLENOL  Take 2 tablets (650 mg total) by mouth every 4 (four) hours as needed for mild pain (pain score 1-3) (or temp >  37.5 C (99.5 F)).   Calcium -Vitamin D -Minerals 600-400 MG-UNIT Chew Chew 1 tablet by mouth daily.   cyanocobalamin  1000 MCG tablet Commonly known as: VITAMIN B12 Take 1,000 mcg by mouth daily.   donepezil  10 MG tablet Commonly known as: ARICEPT  TAKE 1 TABLET BY MOUTH DAILY   hydrocortisone  2.5 % rectal cream Commonly known as: ANUSOL -HC Place rectally 2 (two) times daily as needed for hemorrhoids or anal itching.   losartan  50 MG tablet Commonly known as: COZAAR  Take 1 tablet (50 mg total) by mouth daily. Start taking on: March 07, 2023 What changed:  medication strength how much to take   mouth rinse Liqd solution 15 mLs by Mouth Rinse route as needed (for oral care).   multivitamin tablet Take 1 tablet by mouth daily.   pantoprazole  40 MG tablet Commonly known as: PROTONIX  Take 1 tablet (40 mg total) by mouth at bedtime.   senna-docusate 8.6-50 MG tablet Commonly known as: Senokot-S Take 1 tablet by mouth 2 (two) times daily.   simvastatin  10 MG tablet Commonly known as: ZOCOR  TAKE 1 TABLET BY MOUTH AT  BEDTIME   vitamin C 1000 MG tablet Take 1,000 mg by mouth daily.               Discharge Care Instructions  (From admission, onward)           Start     Ordered   03/06/23 0000  Discharge wound care:       Comments: Continue current wound care regimen.   03/06/23 1124            Follow-up Information     Dina Camie BRAVO, PA-C. Schedule an appointment as soon as possible for a visit in 1 month(s).   Specialty: Neurology Contact information: [address] [place] [place] [address] [phone]                 Time spent: 36 minutes.   SignedBETHA Leatrice LILLETTE Rosario 03/06/2023, 11:24 AM

## 2023-03-06 NOTE — Progress Notes (Signed)
 Report called to Debo, RN @ [employer] and rehab. Patient discharged with [facility]. Son at bedside and aware of discharge plan

## 2023-03-09 DIAGNOSIS — I10 Essential (primary) hypertension: Secondary | ICD-10-CM | POA: Diagnosis not present

## 2023-03-09 DIAGNOSIS — I639 Cerebral infarction, unspecified: Secondary | ICD-10-CM | POA: Diagnosis not present

## 2023-03-09 DIAGNOSIS — R4701 Aphasia: Secondary | ICD-10-CM | POA: Diagnosis not present

## 2023-03-09 DIAGNOSIS — R5381 Other malaise: Secondary | ICD-10-CM | POA: Diagnosis not present

## 2023-03-09 DIAGNOSIS — E782 Mixed hyperlipidemia: Secondary | ICD-10-CM | POA: Diagnosis not present

## 2023-03-09 DIAGNOSIS — K219 Gastro-esophageal reflux disease without esophagitis: Secondary | ICD-10-CM | POA: Diagnosis not present

## 2023-03-10 DIAGNOSIS — R5381 Other malaise: Secondary | ICD-10-CM | POA: Diagnosis not present

## 2023-03-10 DIAGNOSIS — K5900 Constipation, unspecified: Secondary | ICD-10-CM | POA: Diagnosis not present

## 2023-03-10 DIAGNOSIS — I639 Cerebral infarction, unspecified: Secondary | ICD-10-CM | POA: Diagnosis not present

## 2023-03-10 DIAGNOSIS — R4701 Aphasia: Secondary | ICD-10-CM | POA: Diagnosis not present

## 2023-03-11 DIAGNOSIS — E782 Mixed hyperlipidemia: Secondary | ICD-10-CM | POA: Diagnosis not present

## 2023-03-11 DIAGNOSIS — I10 Essential (primary) hypertension: Secondary | ICD-10-CM | POA: Diagnosis not present

## 2023-03-11 DIAGNOSIS — G894 Chronic pain syndrome: Secondary | ICD-10-CM | POA: Diagnosis not present

## 2023-03-11 DIAGNOSIS — R5381 Other malaise: Secondary | ICD-10-CM | POA: Diagnosis not present

## 2023-03-11 DIAGNOSIS — K219 Gastro-esophageal reflux disease without esophagitis: Secondary | ICD-10-CM | POA: Diagnosis not present

## 2023-03-11 DIAGNOSIS — I639 Cerebral infarction, unspecified: Secondary | ICD-10-CM | POA: Diagnosis not present

## 2023-03-11 DIAGNOSIS — R4701 Aphasia: Secondary | ICD-10-CM | POA: Diagnosis not present

## 2023-03-14 DIAGNOSIS — R296 Repeated falls: Secondary | ICD-10-CM | POA: Diagnosis not present

## 2023-03-14 DIAGNOSIS — M542 Cervicalgia: Secondary | ICD-10-CM | POA: Diagnosis not present

## 2023-03-14 DIAGNOSIS — W19XXXA Unspecified fall, initial encounter: Secondary | ICD-10-CM | POA: Diagnosis not present

## 2023-03-14 DIAGNOSIS — S0181XA Laceration without foreign body of other part of head, initial encounter: Secondary | ICD-10-CM | POA: Diagnosis not present

## 2023-03-14 DIAGNOSIS — Z743 Need for continuous supervision: Secondary | ICD-10-CM | POA: Diagnosis not present

## 2023-03-14 DIAGNOSIS — S0990XA Unspecified injury of head, initial encounter: Secondary | ICD-10-CM | POA: Diagnosis not present

## 2023-03-14 DIAGNOSIS — M25552 Pain in left hip: Secondary | ICD-10-CM | POA: Diagnosis not present

## 2023-03-16 ENCOUNTER — Telehealth: Payer: Self-pay | Admitting: *Deleted

## 2023-03-16 DIAGNOSIS — R5381 Other malaise: Secondary | ICD-10-CM | POA: Diagnosis not present

## 2023-03-16 DIAGNOSIS — K219 Gastro-esophageal reflux disease without esophagitis: Secondary | ICD-10-CM | POA: Diagnosis not present

## 2023-03-16 DIAGNOSIS — E782 Mixed hyperlipidemia: Secondary | ICD-10-CM | POA: Diagnosis not present

## 2023-03-16 DIAGNOSIS — I10 Essential (primary) hypertension: Secondary | ICD-10-CM | POA: Diagnosis not present

## 2023-03-16 DIAGNOSIS — R4701 Aphasia: Secondary | ICD-10-CM | POA: Diagnosis not present

## 2023-03-16 DIAGNOSIS — G894 Chronic pain syndrome: Secondary | ICD-10-CM | POA: Diagnosis not present

## 2023-03-16 DIAGNOSIS — I639 Cerebral infarction, unspecified: Secondary | ICD-10-CM | POA: Diagnosis not present

## 2023-03-16 NOTE — Transitions of Care (Post Inpatient/ED Visit) (Signed)
   03/16/2023  Name: ROSLAND RIDING MRN: [id number] DOB: 05/26/1930  Today's TOC FU Call Status: Today's TOC FU Call Status:: Unsuccessful Call (1st Attempt) Unsuccessful Call (1st Attempt) Date: 03/16/23  Attempted to reach the patient regarding the most recent Inpatient/ED visit.  Follow Up Plan: Additional outreach attempts will be made to reach the patient to complete the Transitions of Care (Post Inpatient/ED visit) call.   Signature Ivor Kishi, [facility]

## 2023-03-20 DIAGNOSIS — R5381 Other malaise: Secondary | ICD-10-CM | POA: Diagnosis not present

## 2023-03-20 DIAGNOSIS — R4701 Aphasia: Secondary | ICD-10-CM | POA: Diagnosis not present

## 2023-03-20 DIAGNOSIS — G894 Chronic pain syndrome: Secondary | ICD-10-CM | POA: Diagnosis not present

## 2023-03-20 DIAGNOSIS — K219 Gastro-esophageal reflux disease without esophagitis: Secondary | ICD-10-CM | POA: Diagnosis not present

## 2023-03-20 DIAGNOSIS — I639 Cerebral infarction, unspecified: Secondary | ICD-10-CM | POA: Diagnosis not present

## 2023-03-20 DIAGNOSIS — I10 Essential (primary) hypertension: Secondary | ICD-10-CM | POA: Diagnosis not present

## 2023-03-20 DIAGNOSIS — E782 Mixed hyperlipidemia: Secondary | ICD-10-CM | POA: Diagnosis not present

## 2023-03-23 ENCOUNTER — Ambulatory Visit: Payer: Medicare Other | Admitting: Family Medicine

## 2023-03-23 DIAGNOSIS — G894 Chronic pain syndrome: Secondary | ICD-10-CM | POA: Diagnosis not present

## 2023-03-23 DIAGNOSIS — I10 Essential (primary) hypertension: Secondary | ICD-10-CM | POA: Diagnosis not present

## 2023-03-23 DIAGNOSIS — R4701 Aphasia: Secondary | ICD-10-CM | POA: Diagnosis not present

## 2023-03-23 DIAGNOSIS — K219 Gastro-esophageal reflux disease without esophagitis: Secondary | ICD-10-CM | POA: Diagnosis not present

## 2023-03-23 DIAGNOSIS — R5381 Other malaise: Secondary | ICD-10-CM | POA: Diagnosis not present

## 2023-03-23 DIAGNOSIS — I639 Cerebral infarction, unspecified: Secondary | ICD-10-CM | POA: Diagnosis not present

## 2023-03-23 DIAGNOSIS — E782 Mixed hyperlipidemia: Secondary | ICD-10-CM | POA: Diagnosis not present

## 2023-03-26 DIAGNOSIS — E854 Organ-limited amyloidosis: Secondary | ICD-10-CM | POA: Diagnosis not present

## 2023-03-26 DIAGNOSIS — I619 Nontraumatic intracerebral hemorrhage, unspecified: Secondary | ICD-10-CM | POA: Diagnosis not present

## 2023-03-26 DIAGNOSIS — G8929 Other chronic pain: Secondary | ICD-10-CM | POA: Diagnosis not present

## 2023-03-31 ENCOUNTER — Ambulatory Visit: Payer: Medicare Other | Admitting: Family Medicine

## 2023-03-31 DIAGNOSIS — G8929 Other chronic pain: Secondary | ICD-10-CM | POA: Diagnosis not present

## 2023-03-31 DIAGNOSIS — I619 Nontraumatic intracerebral hemorrhage, unspecified: Secondary | ICD-10-CM | POA: Diagnosis not present

## 2023-03-31 DIAGNOSIS — E559 Vitamin D deficiency, unspecified: Secondary | ICD-10-CM | POA: Diagnosis not present

## 2023-03-31 DIAGNOSIS — E782 Mixed hyperlipidemia: Secondary | ICD-10-CM | POA: Diagnosis not present

## 2023-03-31 DIAGNOSIS — Z9181 History of falling: Secondary | ICD-10-CM | POA: Diagnosis not present

## 2023-03-31 DIAGNOSIS — E854 Organ-limited amyloidosis: Secondary | ICD-10-CM | POA: Diagnosis not present

## 2023-03-31 DIAGNOSIS — K589 Irritable bowel syndrome without diarrhea: Secondary | ICD-10-CM | POA: Diagnosis not present

## 2023-03-31 DIAGNOSIS — R1311 Dysphagia, oral phase: Secondary | ICD-10-CM | POA: Diagnosis not present

## 2023-03-31 DIAGNOSIS — I10 Essential (primary) hypertension: Secondary | ICD-10-CM | POA: Diagnosis not present

## 2023-04-02 DIAGNOSIS — I619 Nontraumatic intracerebral hemorrhage, unspecified: Secondary | ICD-10-CM | POA: Diagnosis not present

## 2023-04-02 DIAGNOSIS — G8929 Other chronic pain: Secondary | ICD-10-CM | POA: Diagnosis not present

## 2023-04-02 DIAGNOSIS — E854 Organ-limited amyloidosis: Secondary | ICD-10-CM | POA: Diagnosis not present

## 2023-04-03 DIAGNOSIS — I619 Nontraumatic intracerebral hemorrhage, unspecified: Secondary | ICD-10-CM | POA: Diagnosis not present

## 2023-04-03 DIAGNOSIS — G8929 Other chronic pain: Secondary | ICD-10-CM | POA: Diagnosis not present

## 2023-04-03 DIAGNOSIS — E854 Organ-limited amyloidosis: Secondary | ICD-10-CM | POA: Diagnosis not present

## 2023-04-06 DIAGNOSIS — E854 Organ-limited amyloidosis: Secondary | ICD-10-CM | POA: Diagnosis not present

## 2023-04-06 DIAGNOSIS — I700 Atherosclerosis of aorta: Secondary | ICD-10-CM | POA: Diagnosis not present

## 2023-04-06 DIAGNOSIS — I119 Hypertensive heart disease without heart failure: Secondary | ICD-10-CM | POA: Diagnosis not present

## 2023-04-06 DIAGNOSIS — I491 Atrial premature depolarization: Secondary | ICD-10-CM | POA: Diagnosis not present

## 2023-04-06 DIAGNOSIS — S06369D Traumatic hemorrhage of cerebrum, unspecified, with loss of consciousness of unspecified duration, subsequent encounter: Secondary | ICD-10-CM | POA: Diagnosis not present

## 2023-04-07 DIAGNOSIS — E854 Organ-limited amyloidosis: Secondary | ICD-10-CM | POA: Diagnosis not present

## 2023-04-07 DIAGNOSIS — I119 Hypertensive heart disease without heart failure: Secondary | ICD-10-CM | POA: Diagnosis not present

## 2023-04-07 DIAGNOSIS — I491 Atrial premature depolarization: Secondary | ICD-10-CM | POA: Diagnosis not present

## 2023-04-07 DIAGNOSIS — I700 Atherosclerosis of aorta: Secondary | ICD-10-CM | POA: Diagnosis not present

## 2023-04-07 DIAGNOSIS — S06369D Traumatic hemorrhage of cerebrum, unspecified, with loss of consciousness of unspecified duration, subsequent encounter: Secondary | ICD-10-CM | POA: Diagnosis not present

## 2023-04-08 DIAGNOSIS — N390 Urinary tract infection, site not specified: Secondary | ICD-10-CM | POA: Diagnosis not present

## 2023-04-08 DIAGNOSIS — Z87440 Personal history of urinary (tract) infections: Secondary | ICD-10-CM | POA: Diagnosis not present

## 2023-04-09 DIAGNOSIS — E854 Organ-limited amyloidosis: Secondary | ICD-10-CM | POA: Diagnosis not present

## 2023-04-09 DIAGNOSIS — I491 Atrial premature depolarization: Secondary | ICD-10-CM | POA: Diagnosis not present

## 2023-04-09 DIAGNOSIS — I700 Atherosclerosis of aorta: Secondary | ICD-10-CM | POA: Diagnosis not present

## 2023-04-09 DIAGNOSIS — I119 Hypertensive heart disease without heart failure: Secondary | ICD-10-CM | POA: Diagnosis not present

## 2023-04-09 DIAGNOSIS — S06369D Traumatic hemorrhage of cerebrum, unspecified, with loss of consciousness of unspecified duration, subsequent encounter: Secondary | ICD-10-CM | POA: Diagnosis not present

## 2023-04-13 DIAGNOSIS — I491 Atrial premature depolarization: Secondary | ICD-10-CM | POA: Diagnosis not present

## 2023-04-13 DIAGNOSIS — S06369D Traumatic hemorrhage of cerebrum, unspecified, with loss of consciousness of unspecified duration, subsequent encounter: Secondary | ICD-10-CM | POA: Diagnosis not present

## 2023-04-13 DIAGNOSIS — I700 Atherosclerosis of aorta: Secondary | ICD-10-CM | POA: Diagnosis not present

## 2023-04-13 DIAGNOSIS — I119 Hypertensive heart disease without heart failure: Secondary | ICD-10-CM | POA: Diagnosis not present

## 2023-04-13 DIAGNOSIS — E854 Organ-limited amyloidosis: Secondary | ICD-10-CM | POA: Diagnosis not present

## 2023-04-14 DIAGNOSIS — Z79899 Other long term (current) drug therapy: Secondary | ICD-10-CM | POA: Diagnosis not present

## 2023-04-14 DIAGNOSIS — I491 Atrial premature depolarization: Secondary | ICD-10-CM | POA: Diagnosis not present

## 2023-04-14 DIAGNOSIS — E854 Organ-limited amyloidosis: Secondary | ICD-10-CM | POA: Diagnosis not present

## 2023-04-14 DIAGNOSIS — I700 Atherosclerosis of aorta: Secondary | ICD-10-CM | POA: Diagnosis not present

## 2023-04-14 DIAGNOSIS — S06369D Traumatic hemorrhage of cerebrum, unspecified, with loss of consciousness of unspecified duration, subsequent encounter: Secondary | ICD-10-CM | POA: Diagnosis not present

## 2023-04-14 DIAGNOSIS — I119 Hypertensive heart disease without heart failure: Secondary | ICD-10-CM | POA: Diagnosis not present

## 2023-04-15 DIAGNOSIS — E854 Organ-limited amyloidosis: Secondary | ICD-10-CM | POA: Diagnosis not present

## 2023-04-15 DIAGNOSIS — S06369D Traumatic hemorrhage of cerebrum, unspecified, with loss of consciousness of unspecified duration, subsequent encounter: Secondary | ICD-10-CM | POA: Diagnosis not present

## 2023-04-17 DIAGNOSIS — I491 Atrial premature depolarization: Secondary | ICD-10-CM | POA: Diagnosis not present

## 2023-04-17 DIAGNOSIS — I119 Hypertensive heart disease without heart failure: Secondary | ICD-10-CM | POA: Diagnosis not present

## 2023-04-17 DIAGNOSIS — E854 Organ-limited amyloidosis: Secondary | ICD-10-CM | POA: Diagnosis not present

## 2023-04-17 DIAGNOSIS — I700 Atherosclerosis of aorta: Secondary | ICD-10-CM | POA: Diagnosis not present

## 2023-04-17 DIAGNOSIS — S06369D Traumatic hemorrhage of cerebrum, unspecified, with loss of consciousness of unspecified duration, subsequent encounter: Secondary | ICD-10-CM | POA: Diagnosis not present

## 2023-04-21 DIAGNOSIS — I700 Atherosclerosis of aorta: Secondary | ICD-10-CM | POA: Diagnosis not present

## 2023-04-21 DIAGNOSIS — N390 Urinary tract infection, site not specified: Secondary | ICD-10-CM | POA: Diagnosis not present

## 2023-04-21 DIAGNOSIS — S06369D Traumatic hemorrhage of cerebrum, unspecified, with loss of consciousness of unspecified duration, subsequent encounter: Secondary | ICD-10-CM | POA: Diagnosis not present

## 2023-04-21 DIAGNOSIS — E854 Organ-limited amyloidosis: Secondary | ICD-10-CM | POA: Diagnosis not present

## 2023-04-21 DIAGNOSIS — R410 Disorientation, unspecified: Secondary | ICD-10-CM | POA: Diagnosis not present

## 2023-04-21 DIAGNOSIS — I491 Atrial premature depolarization: Secondary | ICD-10-CM | POA: Diagnosis not present

## 2023-04-21 DIAGNOSIS — I119 Hypertensive heart disease without heart failure: Secondary | ICD-10-CM | POA: Diagnosis not present

## 2023-04-23 DIAGNOSIS — I119 Hypertensive heart disease without heart failure: Secondary | ICD-10-CM | POA: Diagnosis not present

## 2023-04-23 DIAGNOSIS — S06369D Traumatic hemorrhage of cerebrum, unspecified, with loss of consciousness of unspecified duration, subsequent encounter: Secondary | ICD-10-CM | POA: Diagnosis not present

## 2023-04-23 DIAGNOSIS — I491 Atrial premature depolarization: Secondary | ICD-10-CM | POA: Diagnosis not present

## 2023-04-23 DIAGNOSIS — I700 Atherosclerosis of aorta: Secondary | ICD-10-CM | POA: Diagnosis not present

## 2023-04-23 DIAGNOSIS — E854 Organ-limited amyloidosis: Secondary | ICD-10-CM | POA: Diagnosis not present

## 2023-04-28 ENCOUNTER — Other Ambulatory Visit: Payer: Self-pay | Admitting: Family Medicine

## 2023-04-28 DIAGNOSIS — E854 Organ-limited amyloidosis: Secondary | ICD-10-CM | POA: Diagnosis not present

## 2023-04-28 DIAGNOSIS — N1831 Chronic kidney disease, stage 3a: Secondary | ICD-10-CM | POA: Diagnosis not present

## 2023-04-28 DIAGNOSIS — I700 Atherosclerosis of aorta: Secondary | ICD-10-CM | POA: Diagnosis not present

## 2023-04-28 DIAGNOSIS — I491 Atrial premature depolarization: Secondary | ICD-10-CM | POA: Diagnosis not present

## 2023-04-28 DIAGNOSIS — I619 Nontraumatic intracerebral hemorrhage, unspecified: Secondary | ICD-10-CM | POA: Diagnosis not present

## 2023-04-28 DIAGNOSIS — E782 Mixed hyperlipidemia: Secondary | ICD-10-CM | POA: Diagnosis not present

## 2023-04-28 DIAGNOSIS — S06369D Traumatic hemorrhage of cerebrum, unspecified, with loss of consciousness of unspecified duration, subsequent encounter: Secondary | ICD-10-CM | POA: Diagnosis not present

## 2023-04-28 DIAGNOSIS — E785 Hyperlipidemia, unspecified: Secondary | ICD-10-CM

## 2023-04-28 DIAGNOSIS — E559 Vitamin D deficiency, unspecified: Secondary | ICD-10-CM | POA: Diagnosis not present

## 2023-04-28 DIAGNOSIS — I119 Hypertensive heart disease without heart failure: Secondary | ICD-10-CM | POA: Diagnosis not present

## 2023-04-28 DIAGNOSIS — I10 Essential (primary) hypertension: Secondary | ICD-10-CM | POA: Diagnosis not present

## 2023-04-28 DIAGNOSIS — I130 Hypertensive heart and chronic kidney disease with heart failure and stage 1 through stage 4 chronic kidney disease, or unspecified chronic kidney disease: Secondary | ICD-10-CM | POA: Diagnosis not present

## 2023-04-30 DIAGNOSIS — E854 Organ-limited amyloidosis: Secondary | ICD-10-CM | POA: Diagnosis not present

## 2023-04-30 DIAGNOSIS — I491 Atrial premature depolarization: Secondary | ICD-10-CM | POA: Diagnosis not present

## 2023-04-30 DIAGNOSIS — I700 Atherosclerosis of aorta: Secondary | ICD-10-CM | POA: Diagnosis not present

## 2023-04-30 DIAGNOSIS — I119 Hypertensive heart disease without heart failure: Secondary | ICD-10-CM | POA: Diagnosis not present

## 2023-04-30 DIAGNOSIS — S06369D Traumatic hemorrhage of cerebrum, unspecified, with loss of consciousness of unspecified duration, subsequent encounter: Secondary | ICD-10-CM | POA: Diagnosis not present

## 2023-05-02 ENCOUNTER — Other Ambulatory Visit: Payer: Self-pay | Admitting: Family Medicine

## 2023-05-02 DIAGNOSIS — I10 Essential (primary) hypertension: Secondary | ICD-10-CM

## 2023-05-04 DIAGNOSIS — E854 Organ-limited amyloidosis: Secondary | ICD-10-CM | POA: Diagnosis not present

## 2023-05-04 DIAGNOSIS — I119 Hypertensive heart disease without heart failure: Secondary | ICD-10-CM | POA: Diagnosis not present

## 2023-05-04 DIAGNOSIS — I700 Atherosclerosis of aorta: Secondary | ICD-10-CM | POA: Diagnosis not present

## 2023-05-04 DIAGNOSIS — S06369D Traumatic hemorrhage of cerebrum, unspecified, with loss of consciousness of unspecified duration, subsequent encounter: Secondary | ICD-10-CM | POA: Diagnosis not present

## 2023-05-04 DIAGNOSIS — I491 Atrial premature depolarization: Secondary | ICD-10-CM | POA: Diagnosis not present

## 2023-05-05 DIAGNOSIS — S06369D Traumatic hemorrhage of cerebrum, unspecified, with loss of consciousness of unspecified duration, subsequent encounter: Secondary | ICD-10-CM | POA: Diagnosis not present

## 2023-05-05 DIAGNOSIS — I700 Atherosclerosis of aorta: Secondary | ICD-10-CM | POA: Diagnosis not present

## 2023-05-05 DIAGNOSIS — E854 Organ-limited amyloidosis: Secondary | ICD-10-CM | POA: Diagnosis not present

## 2023-05-05 DIAGNOSIS — I119 Hypertensive heart disease without heart failure: Secondary | ICD-10-CM | POA: Diagnosis not present

## 2023-05-05 DIAGNOSIS — I491 Atrial premature depolarization: Secondary | ICD-10-CM | POA: Diagnosis not present

## 2023-05-12 DIAGNOSIS — I700 Atherosclerosis of aorta: Secondary | ICD-10-CM | POA: Diagnosis not present

## 2023-05-12 DIAGNOSIS — I119 Hypertensive heart disease without heart failure: Secondary | ICD-10-CM | POA: Diagnosis not present

## 2023-05-12 DIAGNOSIS — S06369D Traumatic hemorrhage of cerebrum, unspecified, with loss of consciousness of unspecified duration, subsequent encounter: Secondary | ICD-10-CM | POA: Diagnosis not present

## 2023-05-12 DIAGNOSIS — I491 Atrial premature depolarization: Secondary | ICD-10-CM | POA: Diagnosis not present

## 2023-05-12 DIAGNOSIS — E854 Organ-limited amyloidosis: Secondary | ICD-10-CM | POA: Diagnosis not present

## 2023-05-22 DIAGNOSIS — I491 Atrial premature depolarization: Secondary | ICD-10-CM | POA: Diagnosis not present

## 2023-05-22 DIAGNOSIS — I619 Nontraumatic intracerebral hemorrhage, unspecified: Secondary | ICD-10-CM | POA: Diagnosis not present

## 2023-05-22 DIAGNOSIS — I10 Essential (primary) hypertension: Secondary | ICD-10-CM | POA: Diagnosis not present

## 2023-05-22 DIAGNOSIS — E854 Organ-limited amyloidosis: Secondary | ICD-10-CM | POA: Diagnosis not present

## 2023-05-22 DIAGNOSIS — I700 Atherosclerosis of aorta: Secondary | ICD-10-CM | POA: Diagnosis not present

## 2023-05-22 DIAGNOSIS — I119 Hypertensive heart disease without heart failure: Secondary | ICD-10-CM | POA: Diagnosis not present

## 2023-05-22 DIAGNOSIS — S06369D Traumatic hemorrhage of cerebrum, unspecified, with loss of consciousness of unspecified duration, subsequent encounter: Secondary | ICD-10-CM | POA: Diagnosis not present

## 2023-05-29 ENCOUNTER — Ambulatory Visit: Payer: Medicare Other | Admitting: Physician Assistant

## 2023-05-29 ENCOUNTER — Encounter: Payer: Self-pay | Admitting: Physician Assistant

## 2023-05-29 VITALS — BP 156/78 | HR 74 | Resp 18 | Ht 66.0 in

## 2023-05-29 DIAGNOSIS — G309 Alzheimer's disease, unspecified: Secondary | ICD-10-CM

## 2023-05-29 DIAGNOSIS — F0280 Dementia in other diseases classified elsewhere without behavioral disturbance: Secondary | ICD-10-CM | POA: Diagnosis not present

## 2023-05-29 NOTE — Patient Instructions (Signed)
 It was a pleasure to see you today at our office.   Recommendations:  Follow up in  6 months Consider discontinuing  donepezil 10 mg daily  Agree with 24/7 care, consider memory care  PT for strength and balance  For assessment of decision of mental capacity and competency:  Call Dr. Erick Blinks, geriatric psychiatrist at [phone] Counseling regarding caregiver distress, including caregiver depression, anxiety and issues regarding community resources, adult day care programs, adult living facilities, or memory care questions:  please contact your  Primary Doctor's Social Worker  Whom to call: Memory  decline, memory medications: Call our office [phone]  For psychiatric meds, mood meds: Please have your primary care physician manage these medications.  If you have any severe symptoms of a stroke, or other severe issues such as confusion,severe chills or fever, etc call 911 or go to the ER as you may need to be evaluated further       RECOMMENDATIONS FOR ALL PATIENTS WITH MEMORY PROBLEMS: 1. Continue to exercise (Recommend 30 minutes of walking everyday, or 3 hours every week) 2. Increase social interactions - continue going to [place] and enjoy social gatherings with friends and family 3. Eat healthy, avoid fried foods and eat more fruits and vegetables 4. Maintain adequate blood pressure, blood sugar, and blood cholesterol level. Reducing the risk of stroke and cardiovascular disease also helps promoting better memory. 5. Avoid stressful situations. Live a simple life and avoid aggravations. Organize your time and prepare for the next day in anticipation. 6. Sleep well, avoid any interruptions of sleep and avoid any distractions in the bedroom that may interfere with adequate sleep quality 7. Avoid sugar, avoid sweets as there is a strong link between excessive sugar intake, diabetes, and cognitive impairment We discussed the Mediterranean diet, which has been shown to help  patients reduce the risk of progressive memory disorders and reduces cardiovascular risk. This includes eating fish, eat fruits and green leafy vegetables, nuts like almonds and hazelnuts, walnuts, and also use olive oil. Avoid fast foods and fried foods as much as possible. Avoid sweets and sugar as sugar use has been linked to worsening of memory function.  There is always a concern of gradual progression of memory problems. If this is the case, then we may need to adjust level of care according to patient needs. Support, both to the patient and caregiver, should then be put into place.    FALL PRECAUTIONS: Be cautious when walking. Scan the area for obstacles that may increase the risk of trips and falls. When getting up in the mornings, sit up at the edge of the bed for a few minutes before getting out of bed. Consider elevating the bed at the head end to avoid drop of blood pressure when getting up. Walk always in a well-lit room (use night lights in the walls). Avoid area rugs or power cords from appliances in the middle of the walkways. Use a walker or a cane if necessary and consider physical therapy for balance exercise. Get your eyesight checked regularly.  FINANCIAL OVERSIGHT: Supervision, especially oversight when making financial decisions or transactions is also recommended.  HOME SAFETY: Consider the safety of the kitchen when operating appliances like stoves, microwave oven, and blender. Consider having supervision and share cooking responsibilities until no longer able to participate in those. Accidents with firearms and other hazards in the house should be identified and addressed as well.   ABILITY TO BE LEFT ALONE: If patient is unable  to contact 911 operator, consider using [facility], or when the need is there, arrange for someone to stay with patients. Smoking is a fire hazard, consider supervision or cessation. Risk of wandering should be assessed by caregiver and if detected at any  point, supervision and safe proof recommendations should be instituted.  MEDICATION SUPERVISION: Inability to self-administer medication needs to be constantly addressed. Implement a mechanism to ensure safe administration of the medications.

## 2023-05-29 NOTE — Progress Notes (Signed)
 Assessment/Plan:   Dementia likely due to Alzheimer's Disease    Christine Henderson is a very pleasant 89 y.o. RH female with a history of dementia likely due to early stages of Alzheimer's disease, late onset seen today in follow up for memory loss. Patient is currently on  donepezil 10 mg daily. Cognitive decline is noted, needs assistance with most ADLs. Discussed with her son the role of antidementia medications, may no longer be therapeutic. He will entertain this plan and will call us. She lives in ALF, but [facility] Henderson is looking into memory care which it may be more suitable for her.     Follow up in 6  months. Recommend good control of her cardiovascular risk factors For now, Continue donepezil10 mg  daily. Side effects were discussed  Continue to control mood as per PCP Continue home PT Agree with memory care     Subjective:    This patient is accompanied in the office by her son  who supplements the history.  Previous records as well as any outside records available were reviewed prior to todays visit. Patient was last seen on 11/28/22 with MMSE 28/30     Any changes in memory since last visit? " Worse than prior, especially with STM although LTM is not far behind, places, conversations, names of people, etc". She had ICH with strokes since 01/2023 with prolonged hospitalizations. She no longer does any brain stimulating exercises. Unable to make decisions. Unable to operate the phone or remote. She can only read large headlines on the paper. Less conversant, does not engage in conversations.  repeats oneself? Denies  Disoriented when walking into a room?  Patient denies    Leaving objects?  May misplace things but not in unusual places  Wandering behavior?  denies   Any personality changes since last visit?  denies   Any worsening depression?:  Denies.   Hallucinations or paranoia?  Denies.   Seizures? denies    Any sleep changes?  Sleeps well. Denies vivid dreams, REM behavior or  sleepwalking   Sleep apnea?   Denies.   Any hygiene concerns? Denies.  Independent of bathing and dressing?  Needs assistance.  Does the patient needs help with medications? Facility is in charge   Who is in charge of the finances? Son  is in charge     Any changes in appetite?  Denies. She still can feed herself.      Patient have trouble swallowing? Denies.   Does the patient cook? No Any headaches?   Denies.   Chronic back pain  denies.   Ambulates with difficulty? Cannot stand, wheelchair bound unless assistance from staff.  Recent falls or head injuries? She had a fall with head injury, unwitnessed on 03/14/23 sustaining laceration above L eye and neck pain, negative workup including negative films without fracture.  Unilateral weakness, numbness or tingling? She had an admission to hospital on 02/23/23 due to multifocal intracranial hemorrhage likely traumatic versus CAA related to incidental  L frontal white matter acute ischemic stroke. Prior to the admission she had been found on the bedroom fall. She had been walking around the apartment prior to this, leaving staff and feces "all over".   She was admitted to  the ICU. She was not a candidate for NS, managed medically with antithrombotic therapy. Discharged to SNF. She did Rehab and now on PT/OT. Any tremors?  Denies   Any anosmia?  Denies   Any incontinence of urine?  She has  control of her bladder, wears diapers Any bowel dysfunction?  Occasional incontinence.    Patient lives in [facility].     Does the patient drive? No longer drives     Neuropsychological evaluation 09/17/2022. Briefly, results suggested fairly diffuse cognitive dysfunction. A relative strength was exhibited across basic attention, while performance variability was exhibited across visuospatial abilities and recognition/consolidation aspects of memory. Consistent impairment relative to age-matched peers was exhibited across processing  speed, cognitive flexibility, verbal fluency, confrontation naming, and both encoding (i.e., learning) and delayed retrieval aspects of memory. The etiology of her mild dementia presentation is somewhat unclear. I do have concerns surrounding the presence of a neurodegenerative illness. Of these illnesses, Alzheimer's disease would appear the most likely. Across memory tasks, Christine Henderson had difficulties learning information despite repeated exposure and was fully amnestic (i.e., 0% retention) across 2/3 tasks. She further demonstrated severe impairment surrounding semantic fluency and confrontation naming, which would follow typical disease progression. Neuroimaging also revealed concerns for cerebral amyloid angiopathy, which is an established risk factor for this illness. With that being said, Christine Henderson did exhibit some retention of verbal information, observed chiefly across adequate yes/no recognition trial performances. This would not suggest a complete storage impairment, which is certainly encouraging. If Alzheimer's disease is indeed present, it would appear to still be in somewhat earlier stages based upon memory testing.        Initial Visit 11/01/21  How long did patient have memory difficulties?  Over the last 6 months she had difficulty with names of people.  However, prior to that, he had noticed that the patient had shown some signs of to 4 years ago.  "Last night she went to eat and could not make a decision ".  She enjoys reading the newspaper daily, but cannot do any crossword puzzles or word finding.  Short-term memory is worse than long-term memory.  Patient lives with:  Patient lives alone repeats oneself?  Occasionally "not too often" Disoriented when walking into a room?  Patient denies   Leaving objects in unusual places?  Patient denies   Ambulates  with difficulty?   Patient denies, although "she has more balance issues than prior "-her son says Recent falls?  About 2 months ago,  she had a mechanical fall in the bathtub, without loss of consciousness.  She did not seek medical attention.  Any head injuries?  Patient denies   History of seizures?   Patient denies   Wandering behavior?  Patient denies   Patient drives? Short distances only.  About 2 years ago, the patient had one episode in which he got lost, without recurrence.    Any mood changes?  Patient denies   Any history of depression?:  Patient denies   Hallucinations?  Patient denies   Paranoia?  Patient denies   Patient reports that sleeps well without vivid dreams, REM behavior or sleepwalking . Takes melatonin prn  History of sleep apnea?  Patient denies   Any hygiene concerns?  Patient denies   Independent of bathing and dressing?  Endorsed  Does the patient needs help with medications?  Patient denies   Who is in charge of the finances?  Patient is in charge  with son's supervision  Any changes in appetite?  Does not eat as much "food does not taste as good"  Patient have trouble swallowing? Patient denies   Does the patient cook?  Patient denies   Any kitchen accidents such as leaving  the stove on? Patient denies   Any headaches?  Patient denies   Double vision? Patient denies   Any focal numbness or tingling?  Patient denies   Chronic back pain Patient denies   Unilateral weakness?  Patient denies   Any tremors?  Patient denies   Any history of anosmia?  Patient denies   Any incontinence of urine?  Patient denies   Any bowel dysfunction?   Patient denies   History of heavy alcohol intake?  Patient denies   History of heavy tobacco use?  Patient denies   Family history of dementia?     Mother, Father, and Sister  had  Alzheimer's disease     MRI brain 11/17/21 personally reviewed  was remarkable for moderate chronic small vessel ischemic changes within the cerebral white matter. 4. Fairly numerous chronic microhemorrhages within the supratentorial brain, greatest within the bilateral occipital  lobes suspicious for sequelae of cerebral amyloid angiopathy, chronic blood products from remote subarachnoid hemorrhage, moderate generalized cerebral atrophy.       MRA HEAD FINDINGS   IMPRESSION: 1. Punctate focus of restricted diffusion in the left frontal white matter likely to represent an incidental acute small vessel infarction. 2. Post traumatic intraparenchymal hematomas remain visible in the left temporal and frontal region with mild surrounding edema. Small amount of blood layering dependently within the occipital horns of the lateral ventricles. No significant volume of subarachnoid blood is suggested by MRI. Ventricular size remains stable. 3. Very tiny amount of subdural blood along the lateral convexity on the left, 1 mm in thickness. 4. Old hemorrhagic stroke in the medial right parietooccipital junction region. 5. Background pattern of advanced chronic small-vessel ischemic  change of the white matter, with innumerable punctate foci of hemosiderin deposition which could be due to chronic hypertensive small-vessel disease or amyloid angiopathy. 6. No large vessel occlusion or proximal stenosis. Distal vessels show atherosclerotic narrowing and irregularity.     CT HEAD WITHOUT CONTRAST  Brain: There is a left temporal intracerebral hemorrhage measuring 3.3 x 3.0 cm. Small amount of overlying subarachnoid blood. No mass effect or midline shift. No hydrocephalus.  3.3 cm left temporal intracerebral hemorrhage with small amount of overlying subarachnoid blood. No mass effect or midline shift.     CURRENT MEDICATIONS:  Outpatient Encounter Medications as of 05/29/2023  Medication Sig   acetaminophen (TYLENOL) 325 MG tablet Take 2 tablets (650 mg total) by mouth every 4 (four) hours as needed for mild pain (pain score 1-3) (or temp > 37.5 C (99.5 F)).   Ascorbic Acid (VITAMIN C) 1000 MG tablet Take 1,000 mg by mouth daily.   Calcium Carbonate-Vit D-Min (CALCIUM-VITAMIN  D-MINERALS) 600-400 MG-UNIT CHEW Chew 1 tablet by mouth daily.   cyanocobalamin (VITAMIN B12) 1000 MCG tablet Take 1,000 mcg by mouth daily.   donepezil (ARICEPT) 10 MG tablet TAKE 1 TABLET BY MOUTH DAILY   hydrocortisone (ANUSOL-HC) 2.5 % rectal cream Place rectally 2 (two) times daily as needed for hemorrhoids or anal itching.   losartan (COZAAR) 50 MG tablet Take 1 tablet (50 mg total) by mouth daily.   Mouthwashes (MOUTH RINSE) LIQD solution 15 mLs by Mouth Rinse route as needed (for oral care).   Multiple Vitamin (MULTIVITAMIN) tablet Take 1 tablet by mouth daily.   pantoprazole (PROTONIX) 40 MG tablet Take 1 tablet (40 mg total) by mouth at bedtime.   senna-docusate (SENOKOT-S) 8.6-50 MG tablet Take 1 tablet by mouth 2 (two) times daily.   simvastatin (ZOCOR) 10 MG  tablet TAKE 1 TABLET BY MOUTH AT  BEDTIME   No facility-administered encounter medications on file as of 05/29/2023.       05/28/2022   10:00 AM 11/01/2021   10:00 AM 01/13/2017   10:32 AM  MMSE - Mini Mental State Exam  Orientation to time 5 5 5   Orientation to Place 5 5 5   Registration 3 3 3   Attention/ Calculation 4 1 5   Recall 3 3 3   Language- name 2 objects 2 2 2   Language- repeat 1 0 1  Language- follow 3 step command 3 3 3   Language- read & follow direction 1 1 1   Write a sentence 1 0 1  Copy design 0 0 1  Total score 28 23 30        No data to display          Objective:     PHYSICAL EXAMINATION:    VITALS:   Vitals:   05/29/23 0910  BP: (!) 156/78  Pulse: 74  Resp: 18  SpO2: 98%  Height: 5\' 6"  (1.676 m)    GEN:  The patient appears stated age and is in NAD. HEENT:  Normocephalic, atraumatic.   Neurological examination:  General: NAD, well-groomed, appears stated age, frail appearing Orientation: The patient is alert. Not oOriented to person,  place and date Cranial nerves: There is good facial symmetry.The speech is fluent and clear. No aphasia or dysarthria. Fund of knowledge is  reduced. Recent and remote memory are impaired. Attention and concentration are reduced.  Able to name objects and repeat phrases.  Hearing is intact to conversational tone.   Sensation: Sensation is intact to light touch throughout Motor: Strength is at least antigravity x4. DTR's 1/4 in UE/LE     Movement examination: Tone: There is normal tone in the UE/LE Abnormal movements:  no tremor.  No myoclonus.  No asterixis.   Coordination:  There is decremation with RAM's.Abnormal finger to nose  Gait and Station She is wheelchair bound. Gait unable to be tested    Thank you for allowing [place] the opportunity to participate in the care of this nice patient. Please do not hesitate to contact us for any questions or concerns.   Total time spent on today's visit was 31 minutes dedicated to this patient today, preparing to see patient, examining the patient, ordering tests and/or medications and counseling the patient, documenting clinical information in the EHR or other health record, independently interpreting results and communicating results to the patient/family, discussing treatment and goals, answering patient's questions and coordinating care.  Cc:  Bradd Canary, MD  Marlowe Kays 05/29/2023 12:27 PM

## 2023-06-02 DIAGNOSIS — E854 Organ-limited amyloidosis: Secondary | ICD-10-CM | POA: Diagnosis not present

## 2023-06-02 DIAGNOSIS — S06369D Traumatic hemorrhage of cerebrum, unspecified, with loss of consciousness of unspecified duration, subsequent encounter: Secondary | ICD-10-CM | POA: Diagnosis not present

## 2023-06-02 DIAGNOSIS — I700 Atherosclerosis of aorta: Secondary | ICD-10-CM | POA: Diagnosis not present

## 2023-06-02 DIAGNOSIS — I491 Atrial premature depolarization: Secondary | ICD-10-CM | POA: Diagnosis not present

## 2023-06-02 DIAGNOSIS — I119 Hypertensive heart disease without heart failure: Secondary | ICD-10-CM | POA: Diagnosis not present

## 2023-06-04 DIAGNOSIS — I119 Hypertensive heart disease without heart failure: Secondary | ICD-10-CM | POA: Diagnosis not present

## 2023-06-04 DIAGNOSIS — S06369D Traumatic hemorrhage of cerebrum, unspecified, with loss of consciousness of unspecified duration, subsequent encounter: Secondary | ICD-10-CM | POA: Diagnosis not present

## 2023-06-04 DIAGNOSIS — I700 Atherosclerosis of aorta: Secondary | ICD-10-CM | POA: Diagnosis not present

## 2023-06-04 DIAGNOSIS — E854 Organ-limited amyloidosis: Secondary | ICD-10-CM | POA: Diagnosis not present

## 2023-06-04 DIAGNOSIS — I491 Atrial premature depolarization: Secondary | ICD-10-CM | POA: Diagnosis not present

## 2023-06-08 DIAGNOSIS — I700 Atherosclerosis of aorta: Secondary | ICD-10-CM | POA: Diagnosis not present

## 2023-06-08 DIAGNOSIS — S06369D Traumatic hemorrhage of cerebrum, unspecified, with loss of consciousness of unspecified duration, subsequent encounter: Secondary | ICD-10-CM | POA: Diagnosis not present

## 2023-06-08 DIAGNOSIS — I119 Hypertensive heart disease without heart failure: Secondary | ICD-10-CM | POA: Diagnosis not present

## 2023-06-08 DIAGNOSIS — I491 Atrial premature depolarization: Secondary | ICD-10-CM | POA: Diagnosis not present

## 2023-06-08 DIAGNOSIS — E854 Organ-limited amyloidosis: Secondary | ICD-10-CM | POA: Diagnosis not present

## 2023-06-09 DIAGNOSIS — N1831 Chronic kidney disease, stage 3a: Secondary | ICD-10-CM | POA: Diagnosis not present

## 2023-06-09 DIAGNOSIS — I130 Hypertensive heart and chronic kidney disease with heart failure and stage 1 through stage 4 chronic kidney disease, or unspecified chronic kidney disease: Secondary | ICD-10-CM | POA: Diagnosis not present

## 2023-06-10 DIAGNOSIS — S06369D Traumatic hemorrhage of cerebrum, unspecified, with loss of consciousness of unspecified duration, subsequent encounter: Secondary | ICD-10-CM | POA: Diagnosis not present

## 2023-06-10 DIAGNOSIS — E854 Organ-limited amyloidosis: Secondary | ICD-10-CM | POA: Diagnosis not present

## 2023-06-11 DIAGNOSIS — I130 Hypertensive heart and chronic kidney disease with heart failure and stage 1 through stage 4 chronic kidney disease, or unspecified chronic kidney disease: Secondary | ICD-10-CM | POA: Diagnosis not present

## 2023-06-11 DIAGNOSIS — N1831 Chronic kidney disease, stage 3a: Secondary | ICD-10-CM | POA: Diagnosis not present

## 2023-06-11 NOTE — Telephone Encounter (Signed)
 Error

## 2023-06-12 DIAGNOSIS — I119 Hypertensive heart disease without heart failure: Secondary | ICD-10-CM | POA: Diagnosis not present

## 2023-06-12 DIAGNOSIS — I700 Atherosclerosis of aorta: Secondary | ICD-10-CM | POA: Diagnosis not present

## 2023-06-12 DIAGNOSIS — E854 Organ-limited amyloidosis: Secondary | ICD-10-CM | POA: Diagnosis not present

## 2023-06-12 DIAGNOSIS — S06369D Traumatic hemorrhage of cerebrum, unspecified, with loss of consciousness of unspecified duration, subsequent encounter: Secondary | ICD-10-CM | POA: Diagnosis not present

## 2023-06-12 DIAGNOSIS — I491 Atrial premature depolarization: Secondary | ICD-10-CM | POA: Diagnosis not present

## 2023-06-17 ENCOUNTER — Encounter (HOSPITAL_COMMUNITY): Payer: Self-pay | Admitting: Emergency Medicine

## 2023-06-17 ENCOUNTER — Other Ambulatory Visit: Payer: Self-pay

## 2023-06-17 ENCOUNTER — Emergency Department (HOSPITAL_COMMUNITY)
Admission: EM | Admit: 2023-06-17 | Discharge: 2023-06-18 | Disposition: A | Discharge status: Hospice | Attending: Emergency Medicine | Admitting: Emergency Medicine

## 2023-06-17 ENCOUNTER — Emergency Department (HOSPITAL_COMMUNITY)

## 2023-06-17 DIAGNOSIS — R531 Weakness: Secondary | ICD-10-CM | POA: Diagnosis not present

## 2023-06-17 DIAGNOSIS — I629 Nontraumatic intracranial hemorrhage, unspecified: Secondary | ICD-10-CM

## 2023-06-17 DIAGNOSIS — I499 Cardiac arrhythmia, unspecified: Secondary | ICD-10-CM | POA: Diagnosis not present

## 2023-06-17 DIAGNOSIS — R4182 Altered mental status, unspecified: Secondary | ICD-10-CM | POA: Diagnosis present

## 2023-06-17 DIAGNOSIS — I10 Essential (primary) hypertension: Secondary | ICD-10-CM | POA: Diagnosis not present

## 2023-06-17 DIAGNOSIS — Z515 Encounter for palliative care: Secondary | ICD-10-CM | POA: Diagnosis not present

## 2023-06-17 DIAGNOSIS — I619 Nontraumatic intracerebral hemorrhage, unspecified: Secondary | ICD-10-CM | POA: Diagnosis not present

## 2023-06-17 DIAGNOSIS — Z66 Do not resuscitate: Secondary | ICD-10-CM | POA: Diagnosis not present

## 2023-06-17 DIAGNOSIS — R404 Transient alteration of awareness: Secondary | ICD-10-CM | POA: Diagnosis not present

## 2023-06-17 DIAGNOSIS — I611 Nontraumatic intracerebral hemorrhage in hemisphere, cortical: Secondary | ICD-10-CM

## 2023-06-17 DIAGNOSIS — R6889 Other general symptoms and signs: Secondary | ICD-10-CM | POA: Diagnosis not present

## 2023-06-17 DIAGNOSIS — G935 Compression of brain: Secondary | ICD-10-CM | POA: Diagnosis not present

## 2023-06-17 DIAGNOSIS — I615 Nontraumatic intracerebral hemorrhage, intraventricular: Secondary | ICD-10-CM | POA: Diagnosis not present

## 2023-06-17 DIAGNOSIS — I609 Nontraumatic subarachnoid hemorrhage, unspecified: Secondary | ICD-10-CM | POA: Diagnosis not present

## 2023-06-17 DIAGNOSIS — Z789 Other specified health status: Secondary | ICD-10-CM | POA: Diagnosis not present

## 2023-06-17 DIAGNOSIS — Z7189 Other specified counseling: Secondary | ICD-10-CM | POA: Diagnosis not present

## 2023-06-17 LAB — COMPREHENSIVE METABOLIC PANEL WITH GFR
ALT: 12 U/L (ref 0–44)
AST: 32 U/L (ref 15–41)
Albumin: 3.7 g/dL (ref 3.5–5.0)
Alkaline Phosphatase: 64 U/L (ref 38–126)
Anion gap: 17 — ABNORMAL HIGH (ref 5–15)
BUN: 21 mg/dL (ref 8–23)
CO2: 19 mmol/L — ABNORMAL LOW (ref 22–32)
Calcium: 9.3 mg/dL (ref 8.9–10.3)
Chloride: 96 mmol/L — ABNORMAL LOW (ref 98–111)
Creatinine, Ser: 1.05 mg/dL — ABNORMAL HIGH (ref 0.44–1.00)
GFR, Estimated: 50 mL/min — ABNORMAL LOW (ref >60)
Glucose, Bld: 198 mg/dL — ABNORMAL HIGH (ref 70–99)
Potassium: 4.4 mmol/L (ref 3.5–5.1)
Sodium: 132 mmol/L — ABNORMAL LOW (ref 135–145)
Total Bilirubin: 1.6 mg/dL — ABNORMAL HIGH (ref 0.0–1.2)
Total Protein: 7.1 g/dL (ref 6.5–8.1)

## 2023-06-17 LAB — I-STAT CHEM 8, ED
BUN: 32 mg/dL — ABNORMAL HIGH (ref 8–23)
Calcium, Ion: 0.81 mmol/L — CL (ref 1.15–1.40)
Chloride: 106 mmol/L (ref 98–111)
Creatinine, Ser: 0.9 mg/dL (ref 0.44–1.00)
Glucose, Bld: 197 mg/dL — ABNORMAL HIGH (ref 70–99)
HCT: 43 % (ref 36.0–46.0)
Hemoglobin: 14.6 g/dL (ref 12.0–15.0)
Potassium: 5.3 mmol/L — ABNORMAL HIGH (ref 3.5–5.1)
Sodium: 129 mmol/L — ABNORMAL LOW (ref 135–145)
TCO2: 20 mmol/L — ABNORMAL LOW (ref 22–32)

## 2023-06-17 LAB — DIFFERENTIAL
Abs Immature Granulocytes: 0.1 10*3/uL — ABNORMAL HIGH (ref 0.00–0.07)
Basophils Absolute: 0 10*3/uL (ref 0.0–0.1)
Basophils Relative: 0 %
Eosinophils Absolute: 0 10*3/uL (ref 0.0–0.5)
Eosinophils Relative: 0 %
Immature Granulocytes: 1 %
Lymphocytes Relative: 6 %
Lymphs Abs: 1.1 10*3/uL (ref 0.7–4.0)
Monocytes Absolute: 0.6 10*3/uL (ref 0.1–1.0)
Monocytes Relative: 3 %
Neutro Abs: 17.2 10*3/uL — ABNORMAL HIGH (ref 1.7–7.7)
Neutrophils Relative %: 90 %

## 2023-06-17 LAB — CBC
HCT: 43.1 % (ref 36.0–46.0)
Hemoglobin: 13.9 g/dL (ref 12.0–15.0)
MCH: 30.9 pg (ref 26.0–34.0)
MCHC: 32.3 g/dL (ref 30.0–36.0)
MCV: 95.8 fL (ref 80.0–100.0)
Platelets: 332 10*3/uL (ref 150–400)
RBC: 4.5 MIL/uL (ref 3.87–5.11)
RDW: 16.1 % — ABNORMAL HIGH (ref 11.5–15.5)
WBC: 19 10*3/uL — ABNORMAL HIGH (ref 4.0–10.5)
nRBC: 0 % (ref 0.0–0.2)

## 2023-06-17 LAB — ETHANOL: Alcohol, Ethyl (B): <10 mg/dL (ref <10)

## 2023-06-17 LAB — PROTIME-INR
INR: 1 (ref 0.8–1.2)
Prothrombin Time: 13.8 s (ref 11.4–15.2)

## 2023-06-17 LAB — APTT: aPTT: 27 s (ref 24–36)

## 2023-06-17 MED ORDER — MORPHINE SULFATE (CONCENTRATE) 10 MG /0.5 ML PO SOLN: 5.0000 mg | ORAL | Status: DC | PRN

## 2023-06-17 MED ORDER — SODIUM CHLORIDE 0.9 % IV SOLN: 250.0000 mL | INTRAVENOUS | Status: DC | PRN

## 2023-06-17 MED ORDER — ACETAMINOPHEN 325 MG PO TABS: 650.0000 mg | ORAL_TABLET | Freq: Four times a day (QID) | ORAL | Status: DC | PRN

## 2023-06-17 MED ORDER — ONDANSETRON HCL 4 MG/2ML IJ SOLN: 4.0000 mg | Freq: Four times a day (QID) | INTRAMUSCULAR | Status: DC | PRN

## 2023-06-17 MED ORDER — LORAZEPAM 1 MG PO TABS: 1.0000 mg | ORAL_TABLET | ORAL | Status: DC | PRN

## 2023-06-17 MED ORDER — TRAZODONE HCL 50 MG PO TABS: 25.0000 mg | ORAL_TABLET | Freq: Every evening | ORAL | Status: DC | PRN

## 2023-06-17 MED ORDER — GLYCOPYRROLATE 1 MG PO TABS: 1.0000 mg | ORAL_TABLET | ORAL | Status: DC | PRN

## 2023-06-17 MED ORDER — ACETAMINOPHEN 650 MG RE SUPP: 650.0000 mg | Freq: Four times a day (QID) | RECTAL | Status: DC | PRN

## 2023-06-17 MED ORDER — LORAZEPAM 2 MG/ML IJ SOLN: 1.0000 mg | INTRAMUSCULAR | Status: DC | PRN

## 2023-06-17 MED ORDER — LORAZEPAM 2 MG/ML PO CONC: 1.0000 mg | ORAL | Status: DC | PRN

## 2023-06-17 MED ORDER — SODIUM CHLORIDE 0.9% FLUSH
3.0000 mL | Freq: Two times a day (BID) | INTRAVENOUS | Status: DC
  Administered 2023-06-17: 3 mL via INTRAVENOUS

## 2023-06-17 MED ORDER — SODIUM CHLORIDE 0.9% FLUSH: 3.0000 mL | INTRAVENOUS | Status: DC | PRN

## 2023-06-17 MED ORDER — OLANZAPINE 5 MG PO TBDP
5.0000 mg | ORAL_TABLET | Freq: Every day | ORAL | Status: DC
  Administered 2023-06-17: 5 mg via ORAL
  Filled 2023-06-17: qty 1

## 2023-06-17 MED ORDER — GLYCOPYRROLATE 0.2 MG/ML IJ SOLN
0.2000 mg | INTRAMUSCULAR | Status: DC | PRN
  Administered 2023-06-17: 0.2 mg via INTRAVENOUS
  Filled 2023-06-17: qty 1

## 2023-06-17 MED ORDER — GLYCOPYRROLATE 0.2 MG/ML IJ SOLN: 0.2000 mg | INTRAMUSCULAR | Status: DC | PRN

## 2023-06-17 MED ORDER — ONDANSETRON 4 MG PO TBDP: 4.0000 mg | ORAL_TABLET | Freq: Four times a day (QID) | ORAL | Status: DC | PRN

## 2023-06-17 NOTE — ED Provider Notes (Signed)
 4:55 PM Assumed care of patient from off-going team. For more details, please see note from same day.  In brief, this is a 89 y.o. female with a large intraventricular hemorrhage made comfort care with family at bedside.  Plan/Dispo at time of sign-out & ED Course since sign-out: [ ]  Discussion with social work about residential hospice placement  BP 115/73   Pulse 91   Temp 97.6 F (36.4 C) (Axillary)   Resp 16   Ht 5\' 6"  (1.676 m)   Wt 54 kg   SpO2 100%   BMI 19.21 kg/m    ED Course:   Clinical Course as of 06/17/23 1655  Wed Jun 17, 2023  1655 Unable to find residential hospice placement today.  Wallene Gum with social work discussed with family.  Will plan for inpatient hospice here. [HN]    Clinical Course User Index [HN] Merdis Stalling, MD   D/w admitting hospitalist who states that they will following along for patient but that she doesn't meet criteria for admission to inpatient hospice. Patient will board in the ED and will again involve social work. Comfort care set ordered.   ------------------------------- Annita Kindle, MD Emergency Medicine  This note was created using dictation software, which may contain spelling or grammatical errors.   Merdis Stalling, MD 06/26/23 [phone]

## 2023-06-17 NOTE — ED Notes (Signed)
 Patient resting with eyes closed. Patient family at bedside.

## 2023-06-17 NOTE — ED Notes (Signed)
 Per Pt Son and POA- he would like for pt to return to [employer] and use [facility]

## 2023-06-17 NOTE — Care Management (Addendum)
 Transition of Care [facility]) - Emergency Department Mini Assessment   Patient Details  Name: Christine Henderson MRN: [id number] Date of Birth: Sep 06, 1930  [facility]) CM/SW Contact:    Naoma Bacca, RN Phone Number: 06/17/2023, 1:27 PM   Clinical Narrative: Per chart review patient currently in [facility] ED for emesis, and facial droop (LKW 1930 on 06/16/23). Patient is from [facility] [place] [facility] (memory care) with dementia at baseline.  This RNCM spoke with patient's son/POA Emmaleigh Longo at [phone] who reports he would like hospice services. Landon Pinion reports the MD has advised his mother is unresponsive and not eating and he's unsure if he wants home hospice vs residential hospice. This RNCM explained the difference between the 2 services and offered chose. Patient's son/POA Landon Pinion chose [facility] for residential hospice services. This RNCM advised will make residential hospice referral and a rep from [facility] will contact him. Landon Pinion is in agreement.   This RNCM made residential hospice referral to [facility] with [facility]. Awaiting outcome. Notified MD.   - 3:20pm Per Melissa with [facility], awaiting available bed at [facility]. Evening RNCM is now following.    TOC following     ED Mini Assessment: What brought you to the Emergency Department? : EMS from [facility], this morning at 430 am 1 episode of vomiting. Lethargic this morning.  Barriers to Discharge: Continued Medical Work up  [employer] interventions: coordinating hospice [employer] of departure: Ambulance  Interventions which prevented an admission or readmission: Hospice    Patient Contact and Communications     Spoke with: Tulip Meharg (son)  ,     Contact Phone Number: [phone]    Patient states their goals for this hospitalization and ongoing recovery are:: to get connected with hospice services   Choice offered to / list presented to : [facility] POA / Guardian  Admission diagnosis:  AMS Patient Active Problem List    Diagnosis Date Noted   Cerebral amyloid angiopathy (HCC) 02/26/2023   Pressure injury of skin 02/26/2023   Hemorrhagic stroke (HCC) 02/23/2023   Mild dementia 09/10/2022   Edema 08/14/2022   Renal insufficiency 08/14/2022   Recurrent falls 01/30/2022   Disequilibrium 10/25/2021   Heart murmur 10/25/2021   Vitamin D deficiency 07/14/2016   Hereditary and idiopathic peripheral neuropathy 01/10/2016   Paresthesia of foot, bilateral 07/09/2014   Hyperglycemia 07/09/2014   Onychomycosis 07/09/2014   Lumbar back pain with radiculopathy affecting left lower extremity 11/13/2013   Back pain 06/27/2013   Insomnia 11/14/2012   Osteopenia 11/12/2012   Vasomotor rhinitis 02/02/2008   Essential hypertension 10/06/2007   Hyperlipidemia 10/01/2006   Postmenopausal atrophic vaginitis 10/01/2006   Irritable bowel syndrome 09/10/2006   Disorder of bone and cartilage 09/10/2006   PCP:  Neda Balk, MD Pharmacy:   [facility] [address] [place], [place] - [address] [place] [address] Phone: [phone] Fax: [phone]  [facility]) - [place], [place] - [address] [facility] [address] [place] [address] [place] [address] Phone: [phone] Fax: [phone]  [facility] - [place], [place] - [address] [place] [address] Phone: [phone] Fax: [phone]

## 2023-06-17 NOTE — Consult Note (Signed)
 NEUROLOGY CONSULT NOTE   Date of service: June 17, 2023 Patient Name: JAQUAY MORNEAULT MRN:  [id number] DOB:  1931/02/21 Chief Complaint: "Altered mental status" Requesting Provider: Melene Plan, DO  History of Present Illness  IVELISSE CULVERHOUSE is a 89 y.o. female with hx of dementia (outpatient notes documenting Alzheimer's, concern for CAA versus traumatic bleeds on last admission 02/23/2023), hypertension, hyperlipidemia  She was last seen by family yesterday evening at which time she was conversing normally (some speech impairment from her last hospitalization with traumatic versus CAA related hemorrhage), but otherwise had had no recent complaints at her facility.  Today she was found to have vomited at 4:30 AM and then been minimally responsive with new right sided weakness and brought to the ED for further evaluation  LKW: 7:30 PM Modified rankin score: 5 IV Thrombolysis: No, ICH EVT: No, ICH ICH Score:4 -- 1 point for GCS of 6, 1 point for age of >77, 1 point for greater than 30 cc, 1 point for intraventricular hemorrhage  NIHSS components Score: Comment  1a Level of Conscious 0[]  1[]  2[x]  3[]      1b LOC Questions 0[]  1[]  2[x]       1c LOC Commands 0[]  1[]  2[x]       2 Best Gaze 0[]  1[x]  2[]       3 Visual 0[]  1[]  2[x]  3[]      4 Facial Palsy 0[]  1[x]  2[]  3[]      5a Motor Arm - left 0[]  1[]  2[x]  3[]  4[]  UN[]    5b Motor Arm - Right 0[]  1[]  2[]  3[x]  4[]  UN[]    6a Motor Leg - Left 0[]  1[]  2[x]  3[]  4[]  UN[]    6b Motor Leg - Right 0[]  1[]  2[]  3[x]  4[]  UN[]    7 Limb Ataxia 0[]  1[]  2[]  3[]  UN[x]     8 Sensory 0[x]  1[]  2[]  UN[]      9 Best Language 0[]  1[]  2[]  3[x]      10 Dysarthria 0[]  1[]  2[x]  UN[]      11 Extinct. and Inattention 0[]  1[]  2[]       TOTAL: 30       ROS  Unable to assess secondary to patient's mental status   Past History   Past Medical History:  Diagnosis Date   Abdominal pain 06/26/2008   Back pain 06/27/2013   Neck and low back   Bilateral impacted cerumen  10/21/2020   Disequilibrium 10/25/2021   Edema 08/14/2022   Essential hypertension 10/06/2007   Fibrocystic breast disease    Heart murmur 10/25/2021   Hereditary and idiopathic peripheral neuropathy 01/10/2016   Hyperglycemia 07/09/2014   Hyperlipidemia 10/01/2006   Insomnia 11/14/2012   Irritable bowel syndrome 09/10/2006   Lumbar back pain with radiculopathy affecting left lower extremity 11/13/2013   Metatarsal stress fracture 01/20/2016   Migraine headache 09/10/2006   Mild dementia 09/10/2022   MVP (mitral valve prolapse)    Onychomycosis 07/09/2014   Osteopenia 11/12/2012   Palpitations 09/10/2006   Paresthesia of foot, bilateral 07/09/2014   Postmenopausal atrophic vaginitis 10/01/2006   Recurrent falls 01/30/2022   Renal insufficiency 08/14/2022   Vasomotor rhinitis 02/02/2008   Vitamin D deficiency 07/14/2016    Past Surgical History:  Procedure Laterality Date   DENTAL SURGERY  09/2014   ROTATOR CUFF REPAIR Right    TONSILLECTOMY      Family History: Family History  Problem Relation Age of Onset   Alzheimer's disease Mother    Dementia Father        unspecified type; w/advanced age  Inflammatory bowel disease Sister    Alzheimer's disease Sister    Pancreatic cancer Maternal Grandfather        unknown details   Hyperlipidemia Son    Obesity Son    Hyperlipidemia Son    Heart disease Paternal Uncle    Coronary artery disease Neg Hx    Colon cancer Neg Hx    Stomach cancer Neg Hx     Social History  reports that she has never smoked. She has never used smokeless tobacco. She reports that she does not drink alcohol and does not use drugs.  Allergies  Allergen Reactions   Codeine Nausea And Vomiting    Medications  No current facility-administered medications for this encounter.  Current Outpatient Medications:    acetaminophen (TYLENOL) 325 MG tablet, Take 2 tablets (650 mg total) by mouth every 4 (four) hours as needed for mild pain (pain  score 1-3) (or temp > 37.5 C (99.5 F))., Disp: , Rfl:    Ascorbic Acid (VITAMIN C) 1000 MG tablet, Take 1,000 mg by mouth daily., Disp: , Rfl:    Calcium Carbonate-Vit D-Min (CALCIUM-VITAMIN D-MINERALS) 600-400 MG-UNIT CHEW, Chew 1 tablet by mouth daily., Disp: 30 tablet, Rfl: 0   cyanocobalamin (VITAMIN B12) 1000 MCG tablet, Take 1,000 mcg by mouth daily., Disp: , Rfl:    donepezil (ARICEPT) 10 MG tablet, TAKE 1 TABLET BY MOUTH DAILY, Disp: 100 tablet, Rfl: 2   hydrocortisone (ANUSOL-HC) 2.5 % rectal cream, Place rectally 2 (two) times daily as needed for hemorrhoids or anal itching., Disp: 30 g, Rfl: 0   losartan (COZAAR) 50 MG tablet, Take 1 tablet (50 mg total) by mouth daily., Disp: 30 tablet, Rfl: 1   Mouthwashes (MOUTH RINSE) LIQD solution, 15 mLs by Mouth Rinse route as needed (for oral care)., Disp: , Rfl:    Multiple Vitamin (MULTIVITAMIN) tablet, Take 1 tablet by mouth daily., Disp: , Rfl:    pantoprazole (PROTONIX) 40 MG tablet, Take 1 tablet (40 mg total) by mouth at bedtime., Disp: 30 tablet, Rfl: 0   senna-docusate (SENOKOT-S) 8.6-50 MG tablet, Take 1 tablet by mouth 2 (two) times daily., Disp: 60 tablet, Rfl: 0   simvastatin (ZOCOR) 10 MG tablet, TAKE 1 TABLET BY MOUTH AT  BEDTIME, Disp: 100 tablet, Rfl: 2  Vitals   Vitals:   2023/06/28 1149 28-Jun-2023 1150  BP: 99/72   Pulse: 75   Resp: 14   SpO2: 100%   Weight:  54 kg  Height:  5\' 6"  (1.676 m)    Body mass index is 19.21 kg/m.  Physical Exam   Appears ill with sonorous respirations  Neurologic Examination   See NIH stroke scale above  Labs/Imaging/Neurodiagnostic studies   CBC:  Recent Labs  Lab 06/28/23 1210  HGB 14.6  HCT 43.0   Basic Metabolic Panel:  Lab Results  Component Value Date   NA 129 (L) June 28, 2023   K 5.3 (H) 28-Jun-2023   CO2 24 03/06/2023   GLUCOSE 197 (H) 2023/06/28   BUN 32 (H) 28-Jun-2023   CREATININE 0.90 Jun 28, 2023   CALCIUM 9.1 03/06/2023   GFRNONAA 58 (L) 03/06/2023   GFRAA 41  (L) 06/07/2018   Lipid Panel:  Lab Results  Component Value Date   LDLCALC 59 02/24/2023   HgbA1c:  Lab Results  Component Value Date   HGBA1C 6.4 (H) 02/24/2023   Urine Drug Screen:     Component Value Date/Time   LABOPIA NONE DETECTED 02/23/2023 2127   COCAINSCRNUR NONE DETECTED 02/23/2023  2127   LABBENZ NONE DETECTED 02/23/2023 2127   AMPHETMU NONE DETECTED 02/23/2023 2127   THCU NONE DETECTED 02/23/2023 2127   LABBARB NONE DETECTED 02/23/2023 2127    Alcohol Level     Component Value Date/Time   ETH <10 02/23/2023 1855   INR  Lab Results  Component Value Date   INR 1.0 11/19/2022   APTT  Lab Results  Component Value Date   APTT 26 11/19/2022   AED levels: No results found for: "PHENYTOIN", "ZONISAMIDE", "LAMOTRIGINE", "LEVETIRACETA"  CT Head without contrast(Personally reviewed): Reviewed with family at bedside, significant new left frontal intracerebral hemorrhage, with intraventricular extension  ASSESSMENT   VANELLOPE PASSMORE is a 89 y.o. female who most likely has underlying CVA presenting with a recurrent left lobar intracerebral hemorrhage  Son at bedside reported he has her power of attorney and they have had discussions that she would not want life-sustaining measures such as a feeding tube  We discussed that given the extent of her intracerebral hemorrhage and her current clinical status, her prognosis was extremely poor   He reported that if she were of sound mind and able to understand her current situation she would want to transition to hospice/comfort measures only  We discussed that it is difficult to tell exactly when she may pass it.  Her blood pressures are not high right now and therefore her hemorrhage may not enlarge quickly but there is always the chance of rapid decompensation as well.  RECOMMENDATIONS   Initial recommendations: -Emergent head CT  Additional recommendations: -Hospice and comfort care measures -Appreciate further  care by ED provider and primary team -Inpatient will sign off at this time, please reach out if other questions or concerns arise ______________________________________________________________________   Baldwin Levee MD-PhD [facility] [phone] Available 7 AM to 7 PM, outside these hours please contact Neurologist on call listed on AMION   CRITICAL CARE Performed by: Ronnette Coke   Total critical care time: 35 minutes  Critical care time was exclusive of separately billable procedures and treating other patients.  Critical care was necessary to treat or prevent imminent or life-threatening deterioration.  Critical care was time spent personally by me on the following activities: development of treatment plan with patient and/or surrogate as well as nursing, discussions with consultants, evaluation of patient's response to treatment, examination of patient, obtaining history from patient or surrogate, ordering and performing treatments and interventions, ordering and review of laboratory studies, ordering and review of radiographic studies, pulse oximetry and re-evaluation of patient's condition.

## 2023-06-17 NOTE — Progress Notes (Addendum)
 CSW spoke with Jessee Mormon, RN CM who states she will make appropriate referrals for hospice services.  Shepard Dicker, MSW, LCSW Transitions of Care  Clinical Social Worker II [phone]

## 2023-06-17 NOTE — Care Management (Signed)
 Patient 's has son requested to contact [facility] as well to check on bed availability.  Called [facility] and spoke with Donnise Galen [phone] she states that they would not be able to review patient until tomorrow.  ED RNCM updated son Paulo Bosworth RN and EDP.  EDP will consult Hospitalist for possible admission.

## 2023-06-17 NOTE — ED Triage Notes (Signed)
 BIB EMS from [place], this morning at 430 am 1 episode of vomiting. Lethargic this morning. Dementia at baseline but normally alert to self and son. Today not talking and has facial droop on left side but this is not normal for patient. Per staff LKW would be 1930 yesterday.

## 2023-06-17 NOTE — Consult Note (Signed)
 Initial Consultation Note   Patient: Christine Henderson NFA:[id number] DOB: May 18, 1930 PCP: Neda Balk, MD DOA: 06/17/2023 DOS: the patient was seen and examined on 06/17/2023 Primary service: Merdis Stalling, MD  Referring physician: Dr Drury Geralds Reason for consult: Disposition planning  Assessment and Plan:  Comfort measures Hemorrhagic stroke - Patient transitioned to comfort measures - Comfort order set initiated - Plan for discharge to hospice house as soon as bed is available - She currently is not requiring any IV medications/treatments that would otherwise qualify her for admission. Certainly should this change admission could be revisited. - Patient remains stable and family is agreeable to discharge once hospice bed becomes available  [facility] will continue to follow the patient.  HPI: Christine Henderson is a 89 y.o. female with past medical history of hx of dementia (Alzheimers), HTN, HLD with questionable history of cerebral amyloid angiopathy.   She presents here with acute mental status changes and episodes of vomiting. Noted to have intracranial bleed in the ED. Patient was transitioned to comfort measures with plans to discharge to hospice house at [facility]. Unfortunately no beds are currently available. [facility] was called in consult to assist with patient care in the setting of comfort measures.  Review of Systems: unable to review all systems due to the inability of the patient to answer questions. Past Medical History:  Diagnosis Date   Abdominal pain 06/26/2008   Back pain 06/27/2013   Neck and low back   Bilateral impacted cerumen 10/21/2020   Disequilibrium 10/25/2021   Edema 08/14/2022   Essential hypertension 10/06/2007   Fibrocystic breast disease    Heart murmur 10/25/2021   Hereditary and idiopathic peripheral neuropathy 01/10/2016   Hyperglycemia 07/09/2014   Hyperlipidemia 10/01/2006   Insomnia 11/14/2012   Irritable bowel syndrome 09/10/2006   Lumbar back  pain with radiculopathy affecting left lower extremity 11/13/2013   Metatarsal stress fracture 01/20/2016   Migraine headache 09/10/2006   Mild dementia 09/10/2022   MVP (mitral valve prolapse)    Onychomycosis 07/09/2014   Osteopenia 11/12/2012   Palpitations 09/10/2006   Paresthesia of foot, bilateral 07/09/2014   Postmenopausal atrophic vaginitis 10/01/2006   Recurrent falls 01/30/2022   Renal insufficiency 08/14/2022   Vasomotor rhinitis 02/02/2008   Vitamin D deficiency 07/14/2016   Past Surgical History:  Procedure Laterality Date   DENTAL SURGERY  09/2014   ROTATOR CUFF REPAIR Right    TONSILLECTOMY     Social History:  reports that she has never smoked. She has never used smokeless tobacco. She reports that she does not drink alcohol and does not use drugs.  Allergies  Allergen Reactions   Codeine Nausea And Vomiting    Family History  Problem Relation Age of Onset   Alzheimer's disease Mother    Dementia Father        unspecified type; w/advanced age   Inflammatory bowel disease Sister    Alzheimer's disease Sister    Pancreatic cancer Maternal Grandfather        unknown details   Hyperlipidemia Son    Obesity Son    Hyperlipidemia Son    Heart disease Paternal Uncle    Coronary artery disease Neg Hx    Colon cancer Neg Hx    Stomach cancer Neg Hx     Prior to Admission medications   Medication Sig Start Date End Date Taking? Authorizing Provider  acetaminophen (TYLENOL) 650 MG CR tablet Take 650 mg by mouth every 4 (four) hours as needed for  pain.   Yes [provider]  Ascorbic Acid (VITAMIN C) 1000 MG tablet Take 1,000 mg by mouth daily.   Yes [provider]  Calcium Carbonate-Vit D-Min (CALCIUM-VITAMIN D-MINERALS) 600-400 MG-UNIT CHEW Chew 1 tablet by mouth daily. 03/06/23  Yes Fonnie Iba I, MD  donepezil (ARICEPT) 10 MG tablet TAKE 1 TABLET BY MOUTH DAILY 11/05/22  Yes Wertman, Sara E, PA-C  hydrocortisone (ANUSOL-HC) 2.5 % rectal  cream Place rectally 2 (two) times daily as needed for hemorrhoids or anal itching. 03/06/23  Yes Doroteo Gasmen, MD  losartan (COZAAR) 50 MG tablet Take 1 tablet (50 mg total) by mouth daily. 03/07/23  Yes Doroteo Gasmen, MD  Multiple Vitamin (MULTIVITAMIN) tablet Take 1 tablet by mouth daily.   Yes [provider]  naproxen sodium (ALEVE) 220 MG tablet Take 220 mg by mouth 2 (two) times daily as needed (pain).   Yes [provider]  pantoprazole (PROTONIX) 40 MG tablet Take 1 tablet (40 mg total) by mouth at bedtime. 03/06/23  Yes Doroteo Gasmen, MD  senna-docusate (SENOKOT-S) 8.6-50 MG tablet Take 1 tablet by mouth 2 (two) times daily. Patient taking differently: Take 1 tablet by mouth 3 (three) times a week. M-W-F 03/06/23  Yes Doroteo Gasmen, MD  simvastatin (ZOCOR) 10 MG tablet TAKE 1 TABLET BY MOUTH AT  BEDTIME 04/29/23  Yes Neda Balk, MD  traMADol (ULTRAM) 50 MG tablet Take 50 mg by mouth every 8 (eight) hours as needed for moderate pain (pain score 4-6) or severe pain (pain score 7-10).   Yes [provider]  cyanocobalamin (VITAMIN B12) 1000 MCG tablet Take 1,000 mcg by mouth daily. Patient not taking: Reported on 06/17/2023    [provider]  Mouthwashes (MOUTH RINSE) LIQD solution 15 mLs by Mouth Rinse route as needed (for oral care). Patient not taking: Reported on 06/17/2023 03/06/23   Doroteo Gasmen, MD    Physical Exam: Vitals:   06/17/23 1400 06/17/23 1405 06/17/23 1515 06/17/23 1600  BP: (!) 84/54   115/73  Pulse: (!) 57  (!) 102 91  Resp: (!) 23  18 16   Temp:  97.6 F (36.4 C)    TempSrc:  Axillary    SpO2: 100%  96% 100%  Weight:      Height:       Family Communication: Multiple members at bedside  Thank you very much for involving us  in the care of your patient.  Diego Foy DO 06/17/2023 5:38 PM

## 2023-06-17 NOTE — Care Management (Signed)
 Spoke with Liaison Melissa with [facility], states, there are no beds available at [facility], will reassess in the am.  Updated EDP and son Shelbee Apgar [phone].

## 2023-06-17 NOTE — ED Provider Notes (Signed)
 [place] EMERGENCY DEPARTMENT AT [facility] Provider Note   CSN: [id number] Arrival date & time: 06/17/23  1132  An emergency department physician performed an initial assessment on this suspected stroke patient at 1209.  History  Chief Complaint  Patient presents with   Emesis   Facial Droop    Christine Henderson is a 89 y.o. female.  89 yo F with a cc of altered mental status.  The patient was found this morning like this.  She was last seen normal at 7 PM last night.  No complaints yesterday.  Has been eating and drinking normally.  No recent medication changes.  Had a stroke back in December.  Family is concerned that maybe she had a recurrent stroke.  No fevers noted to be coughing this morning.   Emesis      Home Medications Prior to Admission medications   Medication Sig Start Date End Date Taking? Authorizing Provider  acetaminophen (TYLENOL) 650 MG CR tablet Take 650 mg by mouth every 4 (four) hours as needed for pain.   Yes [provider]  Ascorbic Acid (VITAMIN C) 1000 MG tablet Take 1,000 mg by mouth daily.   Yes [provider]  Calcium Carbonate-Vit D-Min (CALCIUM-VITAMIN D-MINERALS) 600-400 MG-UNIT CHEW Chew 1 tablet by mouth daily. 03/06/23  Yes Berton Mount I, MD  donepezil (ARICEPT) 10 MG tablet TAKE 1 TABLET BY MOUTH DAILY 11/05/22  Yes Gwynneth Munson, Sung Amabile, PA-C  hydrocortisone (ANUSOL-HC) 2.5 % rectal cream Place rectally 2 (two) times daily as needed for hemorrhoids or anal itching. 03/06/23  Yes Barnetta Chapel, MD  losartan (COZAAR) 50 MG tablet Take 1 tablet (50 mg total) by mouth daily. 03/07/23  Yes Barnetta Chapel, MD  Multiple Vitamin (MULTIVITAMIN) tablet Take 1 tablet by mouth daily.   Yes [provider]  naproxen sodium (ALEVE) 220 MG tablet Take 220 mg by mouth 2 (two) times daily as needed (pain).   Yes [provider]  pantoprazole (PROTONIX) 40 MG tablet Take 1 tablet (40 mg total) by mouth at  bedtime. 03/06/23  Yes Barnetta Chapel, MD  senna-docusate (SENOKOT-S) 8.6-50 MG tablet Take 1 tablet by mouth 2 (two) times daily. Patient taking differently: Take 1 tablet by mouth 3 (three) times a week. M-W-F 03/06/23  Yes Barnetta Chapel, MD  simvastatin (ZOCOR) 10 MG tablet TAKE 1 TABLET BY MOUTH AT  BEDTIME 04/29/23  Yes Bradd Canary, MD  traMADol (ULTRAM) 50 MG tablet Take 50 mg by mouth every 8 (eight) hours as needed for moderate pain (pain score 4-6) or severe pain (pain score 7-10).   Yes [provider]  cyanocobalamin (VITAMIN B12) 1000 MCG tablet Take 1,000 mcg by mouth daily. Patient not taking: Reported on 06/17/2023    [provider]  Mouthwashes (MOUTH RINSE) LIQD solution 15 mLs by Mouth Rinse route as needed (for oral care). Patient not taking: Reported on 06/17/2023 03/06/23   Barnetta Chapel, MD      Allergies    Codeine    Review of Systems   Review of Systems  Gastrointestinal:  Positive for vomiting.    Physical Exam Updated Vital Signs BP 115/73   Pulse 91   Temp 97.6 F (36.4 C) (Axillary)   Resp 16   Ht 5\' 6"  (1.676 m)   Wt 54 kg   SpO2 100%   BMI 19.21 kg/m  Physical Exam Vitals and nursing note reviewed.  Constitutional:  General: She is not in acute distress.    Appearance: She is well-developed. She is not diaphoretic.  HENT:     Head: Normocephalic and atraumatic.  Eyes:     Pupils: Pupils are equal, round, and reactive to light.  Cardiovascular:     Rate and Rhythm: Normal rate and regular rhythm.     Heart sounds: No murmur heard.    No friction rub. No gallop.  Pulmonary:     Effort: Pulmonary effort is normal.     Breath sounds: No wheezing or rales.  Abdominal:     General: There is no distension.     Palpations: Abdomen is soft.     Tenderness: There is no abdominal tenderness.  Musculoskeletal:        General: No tenderness.     Cervical back: Normal range of motion and neck supple.  Skin:     General: Skin is warm and dry.  Neurological:     Mental Status: She is alert and oriented to person, place, and time.     Comments: Left-sided gaze preference.  Flaccidity to the right upper extremity.  Left upper extremity move spontaneously.  Psychiatric:        Behavior: Behavior normal.     ED Results / Procedures / Treatments   Labs (all labs ordered are listed, but only abnormal results are displayed) Labs Reviewed  CBC - Abnormal; Notable for the following components:      Result Value   WBC 19.0 (*)    RDW 16.1 (*)    All other components within normal limits  DIFFERENTIAL - Abnormal; Notable for the following components:   Neutro Abs 17.2 (*)    Abs Immature Granulocytes 0.10 (*)    All other components within normal limits  COMPREHENSIVE METABOLIC PANEL WITH GFR - Abnormal; Notable for the following components:   Sodium 132 (*)    Chloride 96 (*)    CO2 19 (*)    Glucose, Bld 198 (*)    Creatinine, Ser 1.05 (*)    Total Bilirubin 1.6 (*)    GFR, Estimated 50 (*)    Anion gap 17 (*)    All other components within normal limits  I-STAT CHEM 8, ED - Abnormal; Notable for the following components:   Sodium 129 (*)    Potassium 5.3 (*)    BUN 32 (*)    Glucose, Bld 197 (*)    Calcium, Ion 0.81 (*)    TCO2 20 (*)    All other components within normal limits  ETHANOL  PROTIME-INR  APTT  RAPID URINE DRUG SCREEN, HOSP PERFORMED  URINALYSIS, ROUTINE W REFLEX MICROSCOPIC    EKG EKG Interpretation Date/Time:  Wednesday June 17 2023 11:54:15 EDT Ventricular Rate:  65 PR Interval:  222 QRS Duration:  89 QT Interval:  460 QTC Calculation: 479 R Axis:   -48  Text Interpretation: Sinus rhythm Ventricular premature complex Prolonged PR interval LAD, consider left anterior fascicular block Low voltage, precordial leads Abnormal R-wave progression, late transition No significant change since last tracing Confirmed by Albertus Hughs [id number]) on 06/17/2023 11:57:41  AM  Radiology CT HEAD CODE STROKE WO CONTRAST Result Date: 06/17/2023 CLINICAL DATA:  Code stroke. Provided history: Neuro deficit, acute, stroke suspected. Right-sided weakness. EXAM: CT HEAD WITHOUT CONTRAST TECHNIQUE: Contiguous axial images were obtained from the base of the skull through the vertex without intravenous contrast. RADIATION DOSE REDUCTION: This exam was performed according to the departmental dose-optimization program which includes  automated exposure control, adjustment of the mA and/or kV according to patient size and/or use of iterative reconstruction technique. COMPARISON:  Brain MRI 02/24/2023. Prior head CT examinations 02/24/2023 and earlier. FINDINGS: Brain: Generalized cerebral atrophy. Large acute parenchymal hemorrhage within the mid to anterior left frontal lobe, measuring 6.1 x 4.4 x 4.9 cm (66 mL). Surrounding edema. Adjacent moderate-volume subarachnoid extension of hemorrhage along the left frontal lobe and within the left insula. Moderate volume intraventricular extension of hemorrhage. Hemorrhage also extends along/within the anterior body of the corpus callosum to the right. Mass effect related to the parenchymal hemorrhage with partial effacement of the lateral ventricles (left greater than right) and 9 mm right midline shift. Rightward subfalcine herniation. Mild prominence of the lateral ventricles elsewhere (as compared to the prior head CT of 11/19/2022) suspicious for a component of hydrocephalus. Known chronic infarcts within the anterior left frontal lobe, left temporal lobe and right parietooccipital lobes. Background moderate patchy and ill-defined hypoattenuation within the cerebral white matter, nonspecific but compatible chronic small vessel disease. Apparent 12 mm infarct within the ventral pons, new from the prior brain MRI of 02/24/2023 but otherwise age-indeterminate. Vascular: No hyperdense vessel.  Atherosclerotic calcifications. Skull: No calvarial  fracture or aggressive osseous lesion. Sinuses/Orbits: No mass or acute finding within the imaged orbits. Small mucous retention cysts within the right sphenoid and left maxillary sinuses. Other: Incompletely assessed cervical spondylosis. C1-C2 ankylosis anteriorly. These results were called by telephone at the time of interpretation on 06/17/2023 at 12:47 pm to provider Endiya Klahr , who verbally acknowledged these results. IMPRESSION: 1. 6.1 x 4.4 x 4.9 cm (66 mL) acute parenchymal hemorrhage within the mid and anterior left frontal lobe. 2. Adjacent moderate-volume subarachnoid extension of hemorrhage along the left frontal lobe and within the left insula. 3. Moderate-volume intraventricular extension of hemorrhage. 4. Hemorrhage also extends along/within the anterior body of the corpus callosum to the right. 5. Mass effect related to the parenchymal hemorrhage with partial effacement of the lateral ventricles (left greater than right), 9 mm rightward midline shift and rightward subfalcine herniation. 6. Mild prominence of the lateral ventricles elsewhere (as compared to the prior head CT of 11/19/2022) suspicious for a component of hydrocephalus. 7. 12 mm infarct within the ventral pons, new from prior exams but otherwise age-indeterminate. Consider a brain MRI for further evaluation. 8. Background parenchymal atrophy, chronic small vessel ischemic disease and chronic infarcts, as described. Electronically Signed   By: Bascom Lily D.O.   On: 06/17/2023 12:50    Procedures .Critical Care  Performed by: Albertus Hughs, DO Authorized by: Albertus Hughs, DO   Critical care provider statement:    Critical care time (minutes):  35   Critical care time was exclusive of:  Separately billable procedures and treating other patients   Critical care was time spent personally by me on the following activities:  Development of treatment plan with patient or surrogate, discussions with consultants, evaluation of patient's  response to treatment, examination of patient, ordering and review of laboratory studies, ordering and review of radiographic studies, ordering and performing treatments and interventions, pulse oximetry, re-evaluation of patient's condition and review of old charts   Care discussed with: admitting provider       Medications Ordered in ED Medications  sodium chloride flush (NS) 0.9 % injection 3 mL (3 mLs Intravenous Given 06/17/23 1400)  sodium chloride flush (NS) 0.9 % injection 3 mL (has no administration in time range)  0.9 %  sodium chloride infusion (has  no administration in time range)  acetaminophen (TYLENOL) tablet 650 mg (has no administration in time range)    Or  acetaminophen (TYLENOL) suppository 650 mg (has no administration in time range)  morphine CONCENTRATE 10 mg / 0.5 ml oral solution 5 mg (has no administration in time range)    Or  morphine CONCENTRATE 10 mg / 0.5 ml oral solution 5 mg (has no administration in time range)  traZODone (DESYREL) tablet 25 mg (has no administration in time range)  LORazepam (ATIVAN) tablet 1 mg (has no administration in time range)    Or  LORazepam (ATIVAN) injection 1 mg (has no administration in time range)  OLANZapine zydis (ZYPREXA) disintegrating tablet 5 mg (5 mg Oral Given 06/17/23 1400)  glycopyrrolate (ROBINUL) tablet 1 mg ( Oral See Alternative 06/17/23 1401)    Or  glycopyrrolate (ROBINUL) injection 0.2 mg ( Subcutaneous See Alternative 06/17/23 1401)    Or  glycopyrrolate (ROBINUL) injection 0.2 mg (0.2 mg Intravenous Given 06/17/23 1401)  ondansetron (ZOFRAN-ODT) disintegrating tablet 4 mg (has no administration in time range)    Or  ondansetron (ZOFRAN) injection 4 mg (has no administration in time range)    ED Course/ Medical Decision Making/ A&P                                 Medical Decision Making Amount and/or Complexity of Data Reviewed Labs: ordered. Radiology: ordered.  Risk OTC drugs. Prescription drug  management.   89 yo F with a chief complaint of altered mental status.  Patient woke up this morning like this.  Last seen normal 7 PM last night.  Initially concern for the possibility of a large vessel occlusion she has a left-sided gaze preference and right-sided flaccidity to the right upper extremity.  I discussed this with neurology, Dr. Iver Nestle, recommended activating as a code stroke.  CT scan is concerning for acute intracranial hemorrhage.  I did discuss the CT scan with the radiologist.  This is discussed with the patient's family, after some consideration they felt the patient would not want aggressive therapy.  Would like to try and go home with home hospice.  Comfort care only at this time.  The patients results and plan were reviewed and discussed.   Any x-rays performed were independently reviewed by myself.   Differential diagnosis were considered with the presenting HPI.  Medications  sodium chloride flush (NS) 0.9 % injection 3 mL (3 mLs Intravenous Given 06/17/23 1400)  sodium chloride flush (NS) 0.9 % injection 3 mL (has no administration in time range)  0.9 %  sodium chloride infusion (has no administration in time range)  acetaminophen (TYLENOL) tablet 650 mg (has no administration in time range)    Or  acetaminophen (TYLENOL) suppository 650 mg (has no administration in time range)  morphine CONCENTRATE 10 mg / 0.5 ml oral solution 5 mg (has no administration in time range)    Or  morphine CONCENTRATE 10 mg / 0.5 ml oral solution 5 mg (has no administration in time range)  traZODone (DESYREL) tablet 25 mg (has no administration in time range)  LORazepam (ATIVAN) tablet 1 mg (has no administration in time range)    Or  LORazepam (ATIVAN) injection 1 mg (has no administration in time range)  OLANZapine zydis (ZYPREXA) disintegrating tablet 5 mg (5 mg Oral Given 06/17/23 1400)  glycopyrrolate (ROBINUL) tablet 1 mg ( Oral See Alternative 06/17/23 1401)  Or   glycopyrrolate (ROBINUL) injection 0.2 mg ( Subcutaneous See Alternative 06/17/23 1401)    Or  glycopyrrolate (ROBINUL) injection 0.2 mg (0.2 mg Intravenous Given 06/17/23 1401)  ondansetron (ZOFRAN-ODT) disintegrating tablet 4 mg (has no administration in time range)    Or  ondansetron (ZOFRAN) injection 4 mg (has no administration in time range)    Vitals:   06/17/23 1400 06/17/23 1405 06/17/23 1515 06/17/23 1600  BP: (!) 84/54   115/73  Pulse: (!) 57  (!) 102 91  Resp: (!) 23  18 16   Temp:  97.6 F (36.4 C)    TempSrc:  Axillary    SpO2: 100%  96% 100%  Weight:      Height:        Final diagnoses:  Nontraumatic cortical hemorrhage of right cerebral hemisphere [facility])           Final Clinical Impression(s) / ED Diagnoses Final diagnoses:  Nontraumatic cortical hemorrhage of right cerebral hemisphere [facility])    Rx / DC Orders ED Discharge Orders     None         Albertus Hughs, DO 06/17/23 1610

## 2023-06-17 NOTE — ED Triage Notes (Signed)
 EMS Vitals  18g LA 500LR  142/70 96% 3l 100pule 26 RR

## 2023-06-17 NOTE — Code Documentation (Signed)
 Stroke Response Nurse Documentation Code Documentation  LYNELL GREENHOUSE is a 89 y.o. female arriving to [facility]  via [facility] on 06/17/2023 with past medical hx of ICH Alzheimer's disease, CAA. On No antithrombotic. Code stroke was activated by ED.   Patient from Assisted living facility where she was LKW at 06/16/2023 at 1930  and now complaining of aphasia and rt sided weakness.   Stroke team at the bedside on patient arrival. Labs drawn and patient cleared for CT by Dr. Inga Manges. Patient to CT with team. NIHSS 25, see documentation for details and code stroke times. Patient with decreased LOC, disoriented, not following commands, left gaze preference , right facial droop, right arm weakness, right leg weakness, and Global aphasia  on exam. The following imaging was completed:  CT Head. Patient is not a candidate for IV Thrombolytic due to ICH. Patient is not a candidate for IR due to ICH.   Care Plan: per Patient's wishes, she will be comfort care.    Bedside handoff with ED RN Daria Eddy.    Lorenzo Pereyra Livengood  Stroke Response RN

## 2023-06-17 NOTE — Progress Notes (Signed)
 [facility] ED 00[address] [facility] Liaison Note  Received request from [facility] manager for family interest in [facility]. Eligibility confirmed.   We do not currently have a routine bed available at [facility]. We will reassess tomorrow or sooner if a bed becomes available.  Thank you for allowing us  to participate in this patient's care.  Ardine Beckwith, LPN [facility] Liaison [phone]

## 2023-06-18 DIAGNOSIS — Z7189 Other specified counseling: Secondary | ICD-10-CM | POA: Diagnosis not present

## 2023-06-18 DIAGNOSIS — Z515 Encounter for palliative care: Secondary | ICD-10-CM | POA: Diagnosis not present

## 2023-06-18 DIAGNOSIS — Z66 Do not resuscitate: Secondary | ICD-10-CM

## 2023-06-18 DIAGNOSIS — Z7401 Bed confinement status: Secondary | ICD-10-CM | POA: Diagnosis not present

## 2023-06-18 DIAGNOSIS — Z789 Other specified health status: Secondary | ICD-10-CM

## 2023-06-18 DIAGNOSIS — R6889 Other general symptoms and signs: Secondary | ICD-10-CM | POA: Diagnosis not present

## 2023-06-18 DIAGNOSIS — R404 Transient alteration of awareness: Secondary | ICD-10-CM | POA: Diagnosis not present

## 2023-06-18 DIAGNOSIS — I611 Nontraumatic intracerebral hemorrhage in hemisphere, cortical: Secondary | ICD-10-CM | POA: Diagnosis not present

## 2023-06-18 DIAGNOSIS — Z743 Need for continuous supervision: Secondary | ICD-10-CM | POA: Diagnosis not present

## 2023-06-18 DIAGNOSIS — R58 Hemorrhage, not elsewhere classified: Secondary | ICD-10-CM | POA: Diagnosis not present

## 2023-06-18 NOTE — ED Notes (Addendum)
 Patient transferred to [facility] by [facility]. Report given to [facility]. Son at bedside made aware of next steps. EMTALA, facesheet, ED transfer, and AVS  printed and given to PTAR. IV secured prior to transport.

## 2023-06-18 NOTE — Progress Notes (Signed)
 [facility] ED 007 [facility] Liaison Note  Received request from [facility] manager for family interest in [facility]. Eligibility confirmed. Met with patient and family to confirm interest and explain services. Family agreeable to transfer today. TOC aware. RN please call report to [phone] prior to patient leaving the unit. Please send signed DNR with patient at discharge.   Thank you for allowing us  to participate in this patient's care.  Ardine Beckwith, LPN [facility] Liaison [phone]

## 2023-06-18 NOTE — ED Provider Notes (Signed)
 Emergency Medicine Observation Re-evaluation Note  Christine Henderson is a 89 y.o. female, seen on rounds today.  Pt initially presented to the ED for complaints of Emesis and Facial Droop Currently, the patient is resting comfortably.  Physical Exam  BP (!) 177/98 (BP Location: Right Arm)   Pulse 88   Temp 98.8 F (37.1 C) (Axillary)   Resp (!) 25   Ht 5\' 6"  (1.676 m)   Wt 54 kg   SpO2 99%   BMI 19.21 kg/m  Physical Exam General: Resting comfortably in stretcher Lungs: Normal work of breathing Psych: Calm  ED Course / MDM  EKG:EKG Interpretation Date/Time:  Wednesday June 17 2023 11:54:15 EDT Ventricular Rate:  65 PR Interval:  222 QRS Duration:  89 QT Interval:  460 QTC Calculation: 479 R Axis:   -48  Text Interpretation: Sinus rhythm Ventricular premature complex Prolonged PR interval LAD, consider left anterior fascicular block Low voltage, precordial leads Abnormal R-wave progression, late transition No significant change since last tracing Confirmed by Albertus Hughs [phone]) on 06/17/2023 11:57:41 AM  I have reviewed the labs performed to date as well as medications administered while in observation.  Recent changes in the last 24 hours include presented yesterday.  Found to have a large ICH which is believed to be terminal.  Transitioned to comfort care.  Discussed with hospitalist who did not feel that she met admission criteria.  Now has a bed at [facility]  Plan  Current plan is for transfer to [facility].  Continued symptom control.  Palliative care consult placed.    Ninetta Basket, MD 06/18/23 [phone]

## 2023-06-18 NOTE — Consult Note (Signed)
 Consultation Note Date: 06/18/2023   Patient Name: Christine Henderson  DOB: 02-25-1931  MRN: [id number]  Age / Sex: 89 y.o., female  PCP: Neda Balk, MD Referring Physician: No att. providers found  Reason for Consultation: Disposition, Establishing goals of care, Non pain symptom management, Pain control, and Terminal Care  HPI/Patient Profile: 89 y.o. female  with past medical history of dementia (Alzheimers), HTN, HLD with questionable history of cerebral amyloid angiopathy presented to ED on 06/17/23 from [place] with staff concerns of AMS and facial droop. Neurology was consulted for code stroke and with head CT found significant new left frontal intracerebral hemorrhage. After discussions with Neurology, family opted for patient's transition to full comfort measures. admitted on 06/17/2023.   Clinical Assessment and Goals of Care: I have reviewed medical records including EPIC notes, labs, any available advanced directives, and imaging. Received report from primary RN - no acute concerns. Per RN, patient not interactive or taking POs.  Went to visit patient at bedside - son/Christine present. Patient was lying in bed unresponsive. No signs or non-verbal gestures of pain or discomfort noted. No respiratory distress, increased work of breathing, or secretions noted. She is on 2L O2 [place].  Met with son  to discuss diagnosis, prognosis, GOC, EOL wishes, disposition, and options.  I introduced Palliative Medicine as specialized medical care for people living with serious illness. It focuses on providing relief from the symptoms and stress of a serious illness. The goal is to improve quality of life for both the patient and the family.  Son confirms goals for full comfort care. He has a clear understanding of her current acute medical situation.   We discussed the option of hospice transfer - he is most interested in  inpatient hospice facility transfer, requesting [facility].  Visit also consisted of discussions dealing with the complex and emotionally intense issues of symptom management and palliative care in the setting of serious and potentially life-threatening illness.   Discussed with family the importance of continued conversation with the medical providers regarding overall plan of care and treatment options, ensuring decisions are within the context of the patient's values and GOCs.    Questions and concerns were addressed. The patient/family was encouraged to call with questions and/or concerns. PMT card was provided.  Cup of coffee provided to [place].   Primary Decision Maker: NEXT OF KIN - son/Christine Henderson    SUMMARY OF RECOMMENDATIONS   Continue full comfort measures Continue DNR/DNI as previously documented - durable DNR form completed and placed in shadow chart. Copy was made and will be scanned into Vyn[facility]/ACP tab Son interested in inpatient hospice transfer, requesting [facility] - TOC consult placed; TOC and hospice liaison notified Added orders for EOL symptom management and to reflect full comfort measures, as well as discontinued orders that were not focused on comfort Unrestricted visitation orders were placed per current [place] EOL visitation policy  Nursing to provide frequent assessments and administer PRN medications as clinically necessary to ensure EOL comfort PMT will continue to follow and support holistically  Symptom Management Morphine  concentrate PRN pain/dyspnea/increased work of breathing/RR>25 Tylenol  PRN pain/fever Robinul  PRN secretions Ativan  PRN anxiety/seizure/sleep/distress Zofran  PRN nausea/vomiting   Code Status/Advance Care Planning: DNR  Palliative Prophylaxis:  Aspiration, Delirium Protocol, Frequent Pain Assessment, Oral Care, and Turn Reposition  Additional Recommendations (Limitations, Scope, Preferences): Full Comfort  Care  Psycho-social/Spiritual:  Desire for further Chaplaincy support:no Created space and opportunity for patient and family to express thoughts and  feelings regarding patient's current medical situation.  Emotional support and therapeutic listening provided.  Prognosis:  < 2 weeks  Discharge Planning: Hospice facility      Primary Diagnoses: Present on Admission: **None**   I have reviewed the medical record, interviewed the patient and family, and examined the patient. The following aspects are pertinent.  Past Medical History:  Diagnosis Date   Abdominal pain 06/26/2008   Back pain 06/27/2013   Neck and low back   Bilateral impacted cerumen 10/21/2020   Disequilibrium 10/25/2021   Edema 08/14/2022   Essential hypertension 10/06/2007   Fibrocystic breast disease    Heart murmur 10/25/2021   Hereditary and idiopathic peripheral neuropathy 01/10/2016   Hyperglycemia 07/09/2014   Hyperlipidemia 10/01/2006   Insomnia 11/14/2012   Irritable bowel syndrome 09/10/2006   Lumbar back pain with radiculopathy affecting left lower extremity 11/13/2013   Metatarsal stress fracture 01/20/2016   Migraine headache 09/10/2006   Mild dementia 09/10/2022   MVP (mitral valve prolapse)    Onychomycosis 07/09/2014   Osteopenia 11/12/2012   Palpitations 09/10/2006   Paresthesia of foot, bilateral 07/09/2014   Postmenopausal atrophic vaginitis 10/01/2006   Recurrent falls 01/30/2022   Renal insufficiency 08/14/2022   Vasomotor rhinitis 02/02/2008   Vitamin D  deficiency 07/14/2016   Social History   Socioeconomic History   Marital status: Widowed    Spouse name: Not on file   Number of children: 4   Years of education: 16   Highest education level: Bachelor's degree (e.g., BA, AB, BS)  Occupational History   Occupation: Retired    Associate Professor: RETIRED  Tobacco Use   Smoking status: Never   Smokeless tobacco: Never  Vaping Use   Vaping status: Never Used  Substance and  Sexual Activity   Alcohol use: No   Drug use: No   Sexual activity: Never    Birth control/protection: None    Comment: regular exercise, lives at the [facility], widowed in 2014  Other Topics Concern   Not on file  Social History Narrative   Left handed   Caffeine 3-4 glasses of tea   Lives alone and does her own medicine      Comes in with son stephen   Social Drivers of Health   Financial Resource Strain: Low Risk  (10/11/2020)   Overall Financial Resource Strain (CARDIA)    Difficulty of Paying Living Expenses: Not hard at all  Food Insecurity: Patient Unable To Answer (02/24/2023)   Hunger Vital Sign    Worried About Running Out of Food in the Last Year: Patient unable to answer    Ran Out of Food in the Last Year: Patient unable to answer  Transportation Needs: Patient Unable To Answer (02/24/2023)   PRAPARE - Transportation    Lack of Transportation (Medical): Patient unable to answer    Lack of Transportation (Non-Medical): Patient unable to answer  Physical Activity: Insufficiently Active (10/11/2020)   Exercise Vital Sign    Days of Exercise per Week: 4 days    Minutes of Exercise per Session: 30 min  Stress: No Stress Concern Present (10/11/2020)   [employer] of Occupational Health - Occupational Stress Questionnaire    Feeling of Stress : Not at all  Social Connections: Patient Unable To Answer (03/03/2023)   Social Connection and Isolation Panel [NHANES]    Frequency of Communication with Friends and Family: Patient unable to answer    Frequency of Social Gatherings with Friends and Family: Patient unable to answer    Attends  Religious Services: Patient unable to answer    Active Member of Clubs or Organizations: Patient unable to answer    Attends Club or Organization Meetings: Patient unable to answer    Marital Status: Patient unable to answer   Family History  Problem Relation Age of Onset   Alzheimer's disease Mother    Dementia Father         unspecified type; w/advanced age   Inflammatory bowel disease Sister    Alzheimer's disease Sister    Pancreatic cancer Maternal Grandfather        unknown details   Hyperlipidemia Son    Obesity Son    Hyperlipidemia Son    Heart disease Paternal Uncle    Coronary artery disease Neg Hx    Colon cancer Neg Hx    Stomach cancer Neg Hx    Scheduled Meds:  OLANZapine  zydis  5 mg Oral Daily   sodium chloride  flush  3 mL Intravenous Q12H   Continuous Infusions:  sodium chloride      PRN Meds:.sodium chloride , acetaminophen  **OR** acetaminophen , glycopyrrolate  **OR** glycopyrrolate  **OR** glycopyrrolate , LORazepam  **OR** [DISCONTINUED] LORazepam  **OR** LORazepam , morphine  CONCENTRATE **OR** morphine  CONCENTRATE, ondansetron  **OR** ondansetron  (ZOFRAN ) IV, sodium chloride  flush, traZODone  Medications Prior to Admission:  Prior to Admission medications   Medication Sig Start Date End Date Taking? Authorizing Provider  acetaminophen  (TYLENOL ) 650 MG CR tablet Take 650 mg by mouth every 4 (four) hours as needed for pain.   Yes [provider]  Ascorbic Acid (VITAMIN C) 1000 MG tablet Take 1,000 mg by mouth daily.   Yes [provider]  Calcium  Carbonate-Vit D-Min (CALCIUM -VITAMIN D -MINERALS) 600-400 MG-UNIT CHEW Chew 1 tablet by mouth daily. 03/06/23  Yes Fonnie Iba I, MD  donepezil  (ARICEPT ) 10 MG tablet TAKE 1 TABLET BY MOUTH DAILY 11/05/22  Yes Wertman, Sara E, PA-C  hydrocortisone  (ANUSOL -HC) 2.5 % rectal cream Place rectally 2 (two) times daily as needed for hemorrhoids or anal itching. 03/06/23  Yes Doroteo Gasmen, MD  losartan  (COZAAR ) 50 MG tablet Take 1 tablet (50 mg total) by mouth daily. 03/07/23  Yes Doroteo Gasmen, MD  Multiple Vitamin (MULTIVITAMIN) tablet Take 1 tablet by mouth daily.   Yes [provider]  naproxen sodium (ALEVE) 220 MG tablet Take 220 mg by mouth 2 (two) times daily as needed (pain).   Yes [provider]   pantoprazole  (PROTONIX ) 40 MG tablet Take 1 tablet (40 mg total) by mouth at bedtime. 03/06/23  Yes Doroteo Gasmen, MD  senna-docusate (SENOKOT-S) 8.6-50 MG tablet Take 1 tablet by mouth 2 (two) times daily. Patient taking differently: Take 1 tablet by mouth 3 (three) times a week. M-W-F 03/06/23  Yes Doroteo Gasmen, MD  simvastatin  (ZOCOR ) 10 MG tablet TAKE 1 TABLET BY MOUTH AT  BEDTIME 04/29/23  Yes Neda Balk, MD  traMADol (ULTRAM) 50 MG tablet Take 50 mg by mouth every 8 (eight) hours as needed for moderate pain (pain score 4-6) or severe pain (pain score 7-10).   Yes [provider]  cyanocobalamin  (VITAMIN B12) 1000 MCG tablet Take 1,000 mcg by mouth daily. Patient not taking: Reported on 06/17/2023    [provider]  Mouthwashes (MOUTH RINSE) LIQD solution 15 mLs by Mouth Rinse route as needed (for oral care). Patient not taking: Reported on 06/17/2023 03/06/23   Fonnie Iba I, MD   Allergies  Allergen Reactions   Codeine Nausea And Vomiting   Review of Systems  Unable to perform ROS: Acuity  of condition    Physical Exam Vitals and nursing note reviewed.  Constitutional:      General: She is not in acute distress.    Appearance: She is ill-appearing.  Pulmonary:     Effort: No respiratory distress.  Skin:    General: Skin is warm and dry.  Neurological:     Mental Status: She is unresponsive.     Vital Signs: BP (!) 193/116   Pulse 88   Temp 98.8 F (37.1 C) (Axillary)   Resp (!) 22   Ht 5\' 6"  (1.676 m)   Wt 54 kg   SpO2 98%   BMI 19.21 kg/m  Pain Scale: 0-10   Pain Score: Asleep   SpO2: SpO2: 98 % O2 Device:SpO2: 98 % O2 Flow Rate: .O2 Flow Rate (L/min): 2 L/min  IO: Intake/output summary: No intake or output data in the 24 hours ending 06/18/23 0947  LBM:   Baseline Weight: Weight: 54 kg Most recent weight: Weight: 54 kg     Palliative Assessment/Data: PPS 10%     Time In: 0930 Time Out: 1045 Time Total: 75  minutes  Signed by: Annette Killings, NP   Please contact Palliative Medicine Team phone at [phone] for questions and concerns.  For individual provider: See Amion  *Portions of this note are a verbal dictation therefore any spelling and/or grammatical errors are due to the "[employer]agon [employer] One" system interpretation.

## 2023-06-18 NOTE — ED Provider Notes (Signed)
 Patient accepted to [facility] by [facility] for hospitalist.  Transport has arrived and is taking the patient.  She was reassessed and appears comfortable at this time.   Ninetta Basket, MD 06/18/23 [phone]

## 2023-06-18 NOTE — Progress Notes (Signed)
 Patient is discharging to [facility].  Shepard Dicker, MSW, LCSW Transitions of Care  Clinical Social Worker II [phone]

## 2023-06-18 NOTE — ED Notes (Signed)
 Called [facility] FOR PT PICK

## 2023-06-18 NOTE — ED Notes (Signed)
 Palliative at bedside

## 2023-06-23 DIAGNOSIS — I10 Essential (primary) hypertension: Secondary | ICD-10-CM | POA: Diagnosis not present

## 2023-06-23 DIAGNOSIS — I619 Nontraumatic intracerebral hemorrhage, unspecified: Secondary | ICD-10-CM | POA: Diagnosis not present

## 2023-07-02 DEATH — deceased

## 2023-07-16 ENCOUNTER — Ambulatory Visit: Payer: Medicare Other | Admitting: Family Medicine

## 2023-11-30 ENCOUNTER — Ambulatory Visit: Admitting: Physician Assistant
